# Patient Record
Sex: Male | Born: 1945
Health system: Southern US, Community
[De-identification: ages and names within clinical notes are randomized; demographics above are authoritative.]

## PROBLEM LIST (undated history)

## (undated) DIAGNOSIS — Z973 Presence of spectacles and contact lenses: Secondary | ICD-10-CM

## (undated) DIAGNOSIS — C189 Malignant neoplasm of colon, unspecified: Secondary | ICD-10-CM

## (undated) DIAGNOSIS — N182 Chronic kidney disease, stage 2 (mild): Secondary | ICD-10-CM

## (undated) DIAGNOSIS — Z972 Presence of dental prosthetic device (complete) (partial): Secondary | ICD-10-CM

## (undated) DIAGNOSIS — Z7901 Long term (current) use of anticoagulants: Secondary | ICD-10-CM

## (undated) DIAGNOSIS — I482 Chronic atrial fibrillation, unspecified: Secondary | ICD-10-CM

## (undated) DIAGNOSIS — H02401 Unspecified ptosis of right eyelid: Secondary | ICD-10-CM

## (undated) DIAGNOSIS — N289 Disorder of kidney and ureter, unspecified: Secondary | ICD-10-CM

## (undated) DIAGNOSIS — T451X5A Adverse effect of antineoplastic and immunosuppressive drugs, initial encounter: Secondary | ICD-10-CM

## (undated) DIAGNOSIS — I119 Hypertensive heart disease without heart failure: Secondary | ICD-10-CM

## (undated) DIAGNOSIS — K219 Gastro-esophageal reflux disease without esophagitis: Secondary | ICD-10-CM

## (undated) DIAGNOSIS — Z9221 Personal history of antineoplastic chemotherapy: Secondary | ICD-10-CM

## (undated) DIAGNOSIS — E2749 Other adrenocortical insufficiency: Secondary | ICD-10-CM

## (undated) DIAGNOSIS — C787 Secondary malignant neoplasm of liver and intrahepatic bile duct: Principal | ICD-10-CM

## (undated) DIAGNOSIS — I10 Essential (primary) hypertension: Secondary | ICD-10-CM

## (undated) DIAGNOSIS — M109 Gout, unspecified: Secondary | ICD-10-CM

## (undated) DIAGNOSIS — Z86018 Personal history of other benign neoplasm: Secondary | ICD-10-CM

## (undated) DIAGNOSIS — Z7189 Other specified counseling: Secondary | ICD-10-CM

## (undated) DIAGNOSIS — E038 Other specified hypothyroidism: Secondary | ICD-10-CM

## (undated) DIAGNOSIS — E291 Testicular hypofunction: Secondary | ICD-10-CM

## (undated) DIAGNOSIS — M1990 Unspecified osteoarthritis, unspecified site: Secondary | ICD-10-CM

## (undated) DIAGNOSIS — Z923 Personal history of irradiation: Secondary | ICD-10-CM

## (undated) DIAGNOSIS — D6481 Anemia due to antineoplastic chemotherapy: Secondary | ICD-10-CM

## (undated) DIAGNOSIS — E240 Pituitary-dependent Cushing's disease: Secondary | ICD-10-CM

## (undated) HISTORY — PX: TRANSPHENOIDAL PITUITARY RESECTION: SHX2572

## (undated) HISTORY — PX: OPEN PARTIAL HEPATECTOMY [83]: SHX5987

## (undated) HISTORY — DX: Secondary malignant neoplasm of liver and intrahepatic bile duct: C78.7

## (undated) HISTORY — PX: COLOSTOMY TAKEDOWN: SHX5258

## (undated) HISTORY — DX: Malignant neoplasm of colon, unspecified: C18.9

## (undated) HISTORY — PX: SUBTOTAL COLECTOMY: SHX855

## (undated) HISTORY — DX: Other specified counseling: Z71.89

## (undated) HISTORY — PX: TOTAL KNEE ARTHROPLASTY: SHX125

---

## 2009-12-28 ENCOUNTER — Ambulatory Visit
Admission: RE | Admit: 2009-12-28 | Discharge: 2009-12-28 | Payer: Self-pay | Source: Home / Self Care | Admitting: Emergency Medicine

## 2009-12-28 DIAGNOSIS — N200 Calculus of kidney: Secondary | ICD-10-CM | POA: Insufficient documentation

## 2009-12-28 DIAGNOSIS — R1031 Right lower quadrant pain: Secondary | ICD-10-CM | POA: Insufficient documentation

## 2009-12-28 LAB — CONVERTED CEMR LAB
Nitrite: NEGATIVE
Protein, U semiquant: NEGATIVE
WBC Urine, dipstick: NEGATIVE
pH: 7

## 2009-12-29 ENCOUNTER — Telehealth (INDEPENDENT_AMBULATORY_CARE_PROVIDER_SITE_OTHER): Payer: Self-pay | Admitting: *Deleted

## 2010-02-04 NOTE — Assessment & Plan Note (Signed)
Summary: ABD. PAIN,NAUSEA/WSE   Vital Signs:  Patient Profile:   65 Years Old Male CC:      abdominal Pain x 5 days,  Height:     71 inches Weight:      265 pounds O2 Sat:      99 % O2 treatment:    Room Air Temp:     98 degrees F oral Pulse rate:   78 / minute Pulse rhythm:   regular Resp:     19 per minute BP sitting:   156 / 98  (left arm) Cuff size:   large  Vitals Entered By: Emilio Math (December 28, 2009 4:52 PM)                  Current Allergies: No known allergies History of Present Illness History from: patient Chief Complaint: abdominal Pain x 5 days,  History of Present Illness: Abd pain for 5 days, slightly worse today.  No urinary symptoms, he is eating and drinking normally, no history of stones, no constipation or diarrhea.  Mild RLQ pain cramping and mild radiation to groin and to back. It only happens at night and he feels fine during the day.  He did have remote abdominal surgery in the past but thinks he still has his appendix.  REVIEW OF SYSTEMS Constitutional Symptoms      Denies fever, chills, night sweats, weight loss, weight gain, and fatigue.  Eyes       Denies change in vision, eye pain, eye discharge, glasses, contact lenses, and eye surgery. Ear/Nose/Throat/Mouth       Denies hearing loss/aids, change in hearing, ear pain, ear discharge, dizziness, frequent runny nose, frequent nose bleeds, sinus problems, sore throat, hoarseness, and tooth pain or bleeding.  Respiratory       Denies dry cough, productive cough, wheezing, shortness of breath, asthma, bronchitis, and emphysema/COPD.  Cardiovascular       Denies murmurs, chest pain, and tires easily with exhertion.    Gastrointestinal       Complains of stomach pain and nausea/vomiting.      Denies diarrhea, constipation, blood in bowel movements, and indigestion. Genitourniary       Denies painful urination, kidney stones, and loss of urinary control. Neurological       Denies paralysis,  seizures, and fainting/blackouts. Musculoskeletal       Denies muscle pain, joint pain, joint stiffness, decreased range of motion, redness, swelling, muscle weakness, and gout.  Skin       Denies bruising, unusual mles/lumps or sores, and hair/skin or nail changes.  Psych       Denies mood changes, temper/anger issues, anxiety/stress, speech problems, depression, and sleep problems.  Past History:  Past Medical History: Arthritis  Past Surgical History: Ulcers in 1968  Family History: Mother - Arthritis, Colon Cancer, Hypertension Father Deceased  Social History: Works in [employer] Married Alcohol use-no Drug use-no Drug Use:  no Physical Exam General appearance: well developed, obese, no distress noted at all Ears: normal, no lesions or deformities Nasal: mucosa pink, nonedematous, no septal deviation, turbinates normal Oral/Pharynx: tongue normal, posterior pharynx without erythema or exudate Chest/Lungs: no rales, wheezes, or rhonchi bilateral, breath sounds equal without effort Heart: Irregular rate and rhythm Abdomen: NT, ND, no guarding, no rebound, +BS4Q, no CVA tenderness, no hernia felt, no HSM, no AAA felt, I presshard on exam and he reports zero pain Back: no tenderness over musculature, straight leg raises negative bilaterally, deep tendon reflexes 2+ at achilles  and patella Skin: no obvious rashes or lesions MSE: oriented to time, place, and person Assessment New Problems: ABDOMINAL PAIN, RIGHT LOWER QUADRANT (ICD-789.03) NEPHROLITHIASIS (ICD-592.0)  UA shows trace blood, gross blood seen also in specimen   Patient Education: Patient and/or caregiver instructed in the following: rest.  Plan New Medications/Changes: CIPROFLOXACIN HCL 250 MG TABS (CIPROFLOXACIN HCL) 1 by mouth two times a day for 5 days  #10 x 0, 12/28/2009, Hoyt Koch MD  New Orders: New Patient Level III [phone] UA Dipstick w/o Micro (automated)  [81003] Planning Comments:     Will treat as kidney stone.  The history (mild, only notices at night) and physical exam are not consistant enough for appendicitis to get a CT scan right now.  I have counseled the patient that if the pain worsens, if any N/V/F, if P.O intolerance, then he needs to go direcdtly to the ER for a CT scan. I have given him a strainer for his urine.  I would like him to see his PCP in 2-3 days no matter what to ensure that he is improving.  Lots of hydration.  If pain continues, should get CT + blood work (CMP, CBC, Amylase, lipase, perhaps ESR, etc).  Patient understands and agrees. Will give prophylactic ABX for kidney stone, but please caution with Coumadin dose and inform your physician.   The patient and/or caregiver has been counseled thoroughly with regard to medications prescribed including dosage, schedule, interactions, rationale for use, and possible side effects and they verbalize understanding.  Diagnoses and expected course of recovery discussed and will return if not improved as expected or if the condition worsens. Patient and/or caregiver verbalized understanding.  Prescriptions: CIPROFLOXACIN HCL 250 MG TABS (CIPROFLOXACIN HCL) 1 by mouth two times a day for 5 days  #10 x 0   Entered and Authorized by:   Hoyt Koch MD   Signed by:   Hoyt Koch MD on 12/28/2009   Method used:   Print then Give to Patient   RxID:   [id number]   Orders Added: 1)  New Patient Level III [29528] 2)  UA Dipstick w/o Micro (automated)  [81003]    Laboratory Results   Urine Tests  Date/Time Received: December 28, 2009 5:19 PM  Date/Time Reported: December 28, 2009 5:19 PM   Routine Urinalysis   Color: yellow Appearance: Cloudy Glucose: negative   (Normal Range: Negative) Bilirubin: negative   (Normal Range: Negative) Ketone: negative   (Normal Range: Negative) Spec. Gravity: 1.020   (Normal Range: 1.003-1.035) Blood: trace-intact   (Normal Range: Negative) pH: 7.0    (Normal Range: 5.0-8.0) Protein: negative   (Normal Range: Negative) Urobilinogen: 0.2   (Normal Range: 0-1) Nitrite: negative   (Normal Range: Negative) Leukocyte Esterace: negative   (Normal Range: Negative)

## 2010-02-04 NOTE — Progress Notes (Signed)
  Phone Note Outgoing Call Call back at [facility] Phone [phone]   Call placed by: Lajean Saver RN,  December 29, 2009 3:30 PM Call placed to: Patient Action Taken: Phone Call Completed Summary of Call: Callback: Patient not home. Wife reports he is a little better.  i told her to give him the message that I called and if he has any questions or concerns to call back.

## 2013-04-16 DIAGNOSIS — L0291 Cutaneous abscess, unspecified: Secondary | ICD-10-CM | POA: Insufficient documentation

## 2013-12-20 HISTORY — PX: REVISION TOTAL KNEE ARTHROPLASTY: SUR1280

## 2017-04-10 DIAGNOSIS — T451X5A Adverse effect of antineoplastic and immunosuppressive drugs, initial encounter: Secondary | ICD-10-CM | POA: Insufficient documentation

## 2017-04-10 DIAGNOSIS — D6481 Anemia due to antineoplastic chemotherapy: Secondary | ICD-10-CM | POA: Insufficient documentation

## 2017-08-30 ENCOUNTER — Inpatient Hospital Stay: Payer: Medicare HMO | Attending: Hematology & Oncology

## 2017-08-30 ENCOUNTER — Other Ambulatory Visit: Payer: Self-pay

## 2017-08-30 ENCOUNTER — Inpatient Hospital Stay (HOSPITAL_BASED_OUTPATIENT_CLINIC_OR_DEPARTMENT_OTHER): Payer: Medicare HMO | Admitting: Hematology & Oncology

## 2017-08-30 ENCOUNTER — Encounter: Payer: Self-pay | Admitting: Hematology & Oncology

## 2017-08-30 VITALS — BP 119/72 | HR 53 | Temp 98.3°F | Resp 20 | Ht 70.0 in | Wt 268.8 lb

## 2017-08-30 DIAGNOSIS — Z9049 Acquired absence of other specified parts of digestive tract: Secondary | ICD-10-CM | POA: Diagnosis not present

## 2017-08-30 DIAGNOSIS — Z9221 Personal history of antineoplastic chemotherapy: Secondary | ICD-10-CM | POA: Diagnosis not present

## 2017-08-30 DIAGNOSIS — C787 Secondary malignant neoplasm of liver and intrahepatic bile duct: Secondary | ICD-10-CM | POA: Insufficient documentation

## 2017-08-30 DIAGNOSIS — Z7901 Long term (current) use of anticoagulants: Secondary | ICD-10-CM | POA: Insufficient documentation

## 2017-08-30 DIAGNOSIS — I482 Chronic atrial fibrillation: Secondary | ICD-10-CM | POA: Insufficient documentation

## 2017-08-30 DIAGNOSIS — Z7189 Other specified counseling: Secondary | ICD-10-CM

## 2017-08-30 DIAGNOSIS — C185 Malignant neoplasm of splenic flexure: Secondary | ICD-10-CM | POA: Insufficient documentation

## 2017-08-30 DIAGNOSIS — C189 Malignant neoplasm of colon, unspecified: Secondary | ICD-10-CM

## 2017-08-30 HISTORY — DX: Other specified counseling: Z71.89

## 2017-08-30 HISTORY — DX: Secondary malignant neoplasm of liver and intrahepatic bile duct: C18.9

## 2017-08-30 LAB — CBC WITH DIFFERENTIAL (CANCER CENTER ONLY)
BASOS ABS: 0 10*3/uL (ref 0.0–0.1)
Basophils Relative: 1 %
EOS ABS: 0.2 10*3/uL (ref 0.0–0.5)
EOS PCT: 4 %
HEMATOCRIT: 38 % — AB (ref 38.7–49.9)
Hemoglobin: 12.3 g/dL — ABNORMAL LOW (ref 13.0–17.1)
LYMPHS PCT: 26 %
Lymphs Abs: 1.3 10*3/uL (ref 0.9–3.3)
MCH: 29.4 pg (ref 28.0–33.4)
MCHC: 32.4 g/dL (ref 32.0–35.9)
MCV: 90.7 fL (ref 82.0–98.0)
MONO ABS: 0.6 10*3/uL (ref 0.1–0.9)
Monocytes Relative: 12 %
Neutro Abs: 2.8 10*3/uL (ref 1.5–6.5)
Neutrophils Relative %: 57 %
PLATELETS: 117 10*3/uL — AB (ref 145–400)
RBC: 4.19 MIL/uL — ABNORMAL LOW (ref 4.20–5.70)
RDW: 14.5 % (ref 11.1–15.7)
WBC: 4.9 10*3/uL (ref 4.0–10.0)

## 2017-08-30 LAB — CMP (CANCER CENTER ONLY)
ALT: 6 U/L (ref 0–44)
ANION GAP: 8 (ref 5–15)
AST: 17 U/L (ref 15–41)
Albumin: 3.5 g/dL (ref 3.5–5.0)
Alkaline Phosphatase: 98 U/L (ref 38–126)
BILIRUBIN TOTAL: 0.6 mg/dL (ref 0.3–1.2)
BUN: 13 mg/dL (ref 8–23)
CHLORIDE: 105 mmol/L (ref 98–111)
CO2: 29 mmol/L (ref 22–32)
Calcium: 9.2 mg/dL (ref 8.9–10.3)
Creatinine: 1.43 mg/dL — ABNORMAL HIGH (ref 0.61–1.24)
GFR, EST NON AFRICAN AMERICAN: 48 mL/min — AB (ref >60)
GFR, Est AFR Am: 56 mL/min — ABNORMAL LOW (ref >60)
Glucose, Bld: 177 mg/dL — ABNORMAL HIGH (ref 70–99)
POTASSIUM: 3.9 mmol/L (ref 3.5–5.1)
Sodium: 142 mmol/L (ref 135–145)
TOTAL PROTEIN: 6.5 g/dL (ref 6.5–8.1)

## 2017-08-30 LAB — LACTATE DEHYDROGENASE: LDH: 252 U/L — AB (ref 98–192)

## 2017-08-30 NOTE — Progress Notes (Signed)
Referral MD  Reason for Referral: Metastatic colon cancer-second opinion  Chief Complaint  Patient presents with  . New Patient (Initial Visit)  : I just wanted to make sure that everything was being done.  HPI: Zachary Willis is a real nice 72 year old white male.  He comes in with his wife.  He is originally from [place].  He actually graduated from my wife's high school.  He was diagnosed with what sounds like high risk stage II colon cancer back in December 2017.  He had obstruction.  He had resection of the tumor.  The tumor was in the splenic flexure.  He was found to have stage IIb disease (T4bN0M0).  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  He was offered adjuvant chemotherapy because he was felt to be high risk.  He declined.  Unfortunately, his CEA started going up in May 2018.  He ultimately was found to have a solitary liver lesion in August 2018.  This was confirmed by biopsy.  He subsequently had "neoadjuvant" chemotherapy with FOLFOX.  He then underwent a partial hepatectomy in January 2019.  All margins were negative.  He then had adjuvant FOLFOX for 11 cycles.  Unfortunately, his tumor has continued to recur.  In August 2019, a CT scan showed 2 liver lesions.  There is also some questionable left supraclavicular and left cervical lymph nodes.  I would not think that these would be malignant.  He is getting fantastic care from his oncologist in [place].  He was offered systemic chemotherapy with FOLFIRI.  He has felt good being off of treatment.  He really has not had any complaints.  He has had no abdominal pain.  He has had no issues with bowels or bladder.  He has had no nausea or vomiting.  He has had no cough.  He does have multiple medical problems.  He is on chronic Coumadin for atrial fibrillation.  I am not sure why he is on Coumadin and not on 1 of the new oral anticoagulants.  Being on Coumadin is such a hassle from my point of view.  He and his wife are  going to go on a cruise the first week in September.  They will be going to the [place].  They just wanted to have a second opinion to see if everything has been done that needs to be done so far.  They also want to see what other options they might have.  He has had no neuropathy.  He has had no rashes.  He has had no mouth sores.  Currently, his performance status is ECOG 1.    Past Medical History:  Diagnosis Date  . Colon cancer metastasized to liver ([place]) 08/30/2017  . Goals of care, counseling/discussion 08/30/2017  :  History reviewed. No pertinent surgical history.:   Current Outpatient Medications:  .  allopurinol (ZYLOPRIM) 300 MG tablet, Take 300 mg by mouth daily., Disp: , Rfl:  .  doxazosin (CARDURA) 4 MG tablet, 4 mg at bedtime., Disp: , Rfl: 1 .  ferrous sulfate 325 (65 FE) MG tablet, Take 325 mg by mouth daily., Disp: , Rfl:  .  furosemide (LASIX) 40 MG tablet, Take 20 mg by mouth daily., Disp: , Rfl:  .  hydrocortisone (CORTEF) 10 MG tablet, Takes 15 mg in am and 2m in afternoon., Disp: , Rfl: 4 .  levothyroxine (SYNTHROID, LEVOTHROID) 88 MCG tablet, Take 88 mcg by mouth daily., Disp: , Rfl:  .  metoprolol succinate (TOPROL-XL)  50 MG 24 hr tablet, Take 50 mg by mouth daily., Disp: , Rfl:  .  NASAL WASH NA, daily., Disp: , Rfl:  .  pantoprazole (PROTONIX) 40 MG tablet, Take 40 mg by mouth daily., Disp: , Rfl:  .  polyvinyl alcohol (LIQUIFILM TEARS) 1.4 % ophthalmic solution, daily as needed., Disp: , Rfl:  .  potassium chloride (KLOR-CON 10) 10 MEQ tablet, Take 20 mEq by mouth daily., Disp: , Rfl:  .  prochlorperazine (COMPAZINE) 10 MG tablet, Take by mouth., Disp: , Rfl:  .  testosterone (ANDRODERM) 4 MG/24HR PT24 patch, Place onto the skin., Disp: , Rfl:  .  warfarin (COUMADIN) 5 MG tablet, Take 5 mg by mouth daily., Disp: , Rfl: 0 .  loperamide (IMODIUM) 2 MG capsule, Take by mouth., Disp: , Rfl: :  :  No Known Allergies:  History reviewed. No  pertinent family history.:  Social History   Socioeconomic History  . Marital status: Married    Spouse name: Not on file  . Number of children: Not on file  . Years of education: Not on file  . Highest education level: Not on file  Occupational History  . Not on file  Social Needs  . Financial resource strain: Not on file  . Food insecurity:    Worry: Not on file    Inability: Not on file  . Transportation needs:    Medical: Not on file    Non-medical: Not on file  Tobacco Use  . Smoking status: Never Smoker  . Smokeless tobacco: Never Used  Substance and Sexual Activity  . Alcohol use: Not Currently  . Drug use: Never  . Sexual activity: Not on file  Lifestyle  . Physical activity:    Days per week: Not on file    Minutes per session: Not on file  . Stress: Not on file  Relationships  . Social connections:    Talks on phone: Not on file    Gets together: Not on file    Attends religious service: Not on file    Active member of club or organization: Not on file    Attends meetings of clubs or organizations: Not on file    Relationship status: Not on file  . Intimate partner violence:    Fear of current or ex partner: Not on file    Emotionally abused: Not on file    Physically abused: Not on file    Forced sexual activity: Not on file  Other Topics Concern  . Not on file  Social History Narrative  . Not on file  :  Review of Systems  Constitutional: Negative.   HENT: Negative.   Eyes: Negative.   Respiratory: Negative.   Cardiovascular: Negative.   Gastrointestinal: Negative.   Genitourinary: Negative.   Musculoskeletal: Negative.   Skin: Negative.   Neurological: Negative.   Endo/Heme/Allergies: Negative.   Psychiatric/Behavioral: Negative.      Exam: Moderately obese white male in no obvious distress.  His vital signs show a temperature of 98.3.  Pulse 53.  Blood pressure 119/72.  Weight is 268 pounds.  Head neck exam shows no ocular or oral  lesions.  There might be some slight ptosis of his right eye.  He has good pupillary reflex.  He has no oral lesions.  He has no adenopathy in the neck.  Thyroid is not palpable.  Lungs are clear bilaterally.  Cardiac exam regular rate and rhythm with no murmurs, rubs or bruits.  Abdomen is  soft.  He has multiple laparotomy scars.  He has no fluid wave.  He is moderately obese.  There is no guarding or rebound tenderness.  He has no palpable liver or spleen tip.  Back exam shows no tenderness over the spine, ribs or hips.  Extremities shows no clubbing, cyanosis or edema.  He may have some slight nonpitting edema of his legs.  He has good range of motion of his joints.  Neurological exam shows no focal neurological deficit.  Skin exam shows no rashes, ecchymoses or petechia. '@IPVITALS' @   Recent Labs    08/30/17 1118  WBC 4.9  HGB 12.3*  HCT 38.0*  PLT 117*   Recent Labs    08/30/17 1118  NA 142  K 3.9  CL 105  CO2 29  GLUCOSE 177*  BUN 13  CREATININE 1.43*  CALCIUM 9.2    Blood smear review: None  Pathology: None    Assessment and Plan: Mr. Arkwright is a really nice 72 year old white male.  He has metastatic colon cancer.  He keeps having recurrent liver mets.  He is Artie had a partial hepatectomy.  He has been on chemotherapy with FOLFOX.  Unfortunately, he cannot receive EGFR inhibitors because he is K-ras positive.  He has not had FOLFIRI with Avastin.  I think this would be very reasonable.  Given that this tumor is left sided, there might be some benefit to Avastin with his full ferry.  I think the benefit of Avastin would outweigh the risk.  He is on blood thinner so I do not think a thrombo-embolic event from Avastin would be likely.  If he does not want systemic chemotherapy right now, he could have RFA therapy to these liver lesions.  They seem to be small enough that they would respond to RFA therapy.  I also talked him about the possibility of doing a biopsy of 1 of the  liver lesions.  I am a big believer of trying to obtain as up-to-date as possible genetic information that might be useful to see if targeted therapy could be accomplished.  I think this would be helpful.  He has not had a PET scan.  This might be helpful to see if he does have extrahepatic disease.  I reassured him that I really thought that he was getting very good care from Dr. Marylou Mccoy in [place].  I think that she has done a very good job.  He certainly has aggressive disease given the fact that his cancer is K-ras mutant.  I told Mr. Paullin and his wife that I would be more than happy to help out in any way possible.  However, he seems to be doing pretty well over at [facility].  He seems to have a good relationship with his oncologist there.  I spent a good 60 minutes with he and his wife.  All the time was spent face-to-face.  I was reviewing his past labs and past records from [place].  I was counseling them.  I will certainly help in any way that I can with any additional appointments if they would like to come back to see me.  I reassured him that they could certainly go on their [place] and not have any issues if he has had no treatment.  It will be interesting to see what his CEA level is.  Again, I will will be more than happy to see him back in our office if he wishes to have any treatment here.

## 2017-08-31 LAB — CEA (IN HOUSE-CHCC): CEA (CHCC-In House): 35.88 ng/mL — ABNORMAL HIGH (ref 0.00–5.00)

## 2017-09-18 ENCOUNTER — Ambulatory Visit: Payer: Medicare HMO | Admitting: Hematology & Oncology

## 2017-09-18 ENCOUNTER — Other Ambulatory Visit: Payer: Medicare HMO

## 2017-09-19 ENCOUNTER — Encounter: Payer: Self-pay | Admitting: Hematology & Oncology

## 2017-09-19 ENCOUNTER — Other Ambulatory Visit: Payer: Self-pay

## 2017-09-19 ENCOUNTER — Inpatient Hospital Stay: Payer: Medicare HMO | Attending: Hematology & Oncology | Admitting: Hematology & Oncology

## 2017-09-19 VITALS — BP 113/81 | HR 79 | Temp 98.5°F | Resp 20 | Wt 266.0 lb

## 2017-09-19 DIAGNOSIS — I482 Chronic atrial fibrillation: Secondary | ICD-10-CM | POA: Diagnosis not present

## 2017-09-19 DIAGNOSIS — C189 Malignant neoplasm of colon, unspecified: Secondary | ICD-10-CM | POA: Diagnosis present

## 2017-09-19 DIAGNOSIS — Z7901 Long term (current) use of anticoagulants: Secondary | ICD-10-CM

## 2017-09-19 DIAGNOSIS — C787 Secondary malignant neoplasm of liver and intrahepatic bile duct: Secondary | ICD-10-CM | POA: Insufficient documentation

## 2017-09-19 NOTE — Progress Notes (Signed)
Hematology and Oncology Follow Up Visit  Zachary Willis [id number] 10-27-1945 72 y.o. 09/19/2017   Principle Diagnosis:   Metastatic colon cancer  Current Therapy:    Observation     Interim History:  [name] is back for second office visit.  We first saw him back on 08/30/2017.  At that time, he had been treated over at [facility].  He had gotten very good care.  He thought that things might be better if he could get treated closer to where he lives.  He and his wife does go back from a [place] cruise.  That a wonderful time.  When we saw him, the only areas of malignancy that I could detect was in his liver.  I think that he may have had 1 or 2 lesions in his liver.  We are going to get a PET scan on him.  I think that if he only has some disease in his liver, that we should consider a form of intrahepatic therapy.  I think this would be very reasonable for him.  He looks like he has had quite a bit of systemic therapy already.  I also thought about the possibility of a biopsy.  Again this would be invasive.  He is on Coumadin for chronic atrial fibrillation.  He is eating well.  He has had no problems with pain.  He has had no bleeding issues.  There is no cough or shortness of breath.  Overall, I said his performance status is ECOG 1.   Medications:  Current Outpatient Medications:  .  allopurinol (ZYLOPRIM) 300 MG tablet, Take 300 mg by mouth daily., Disp: , Rfl:  .  doxazosin (CARDURA) 4 MG tablet, 4 mg at bedtime., Disp: , Rfl: 1 .  ferrous sulfate 325 (65 FE) MG tablet, Take 325 mg by mouth daily., Disp: , Rfl:  .  furosemide (LASIX) 40 MG tablet, Take 20 mg by mouth daily., Disp: , Rfl:  .  hydrocortisone (CORTEF) 10 MG tablet, Takes 15 mg in am and 5mg  in afternoon., Disp: , Rfl: 4 .  levothyroxine (SYNTHROID, LEVOTHROID) 88 MCG tablet, Take 88 mcg by mouth daily., Disp: , Rfl:  .  metoprolol succinate (TOPROL-XL) 50 MG 24 hr tablet, Take 50 mg by mouth daily., Disp: ,  Rfl:  .  NASAL WASH NA, daily., Disp: , Rfl:  .  pantoprazole (PROTONIX) 40 MG tablet, Take 40 mg by mouth daily., Disp: , Rfl:  .  polyvinyl alcohol (LIQUIFILM TEARS) 1.4 % ophthalmic solution, daily as needed., Disp: , Rfl:  .  potassium chloride (KLOR-CON 10) 10 MEQ tablet, Take 20 mEq by mouth daily., Disp: , Rfl:  .  testosterone (ANDRODERM) 4 MG/24HR PT24 patch, Place onto the skin., Disp: , Rfl:  .  loperamide (IMODIUM) 2 MG capsule, Take by mouth as needed. , Disp: , Rfl:  .  prochlorperazine (COMPAZINE) 10 MG tablet, Take by mouth., Disp: , Rfl:  .  warfarin (COUMADIN) 5 MG tablet, Take 5 mg by mouth daily., Disp: , Rfl: 0  Allergies: No Known Allergies  Past Medical History, Surgical history, Social history, and Family History were reviewed and updated.  Review of Systems: Review of Systems  Constitutional: Negative.   HENT:  Negative.   Eyes: Negative.   Respiratory: Negative.   Cardiovascular: Negative.   Gastrointestinal: Negative.   Endocrine: Negative.   Genitourinary: Negative.    Musculoskeletal: Negative.   Skin: Negative.   Neurological: Negative.   Hematological: Negative.  Psychiatric/Behavioral: Negative.     Physical Exam:  weight is 266 lb 0.6 oz (120.7 kg). His oral temperature is 98.5 F (36.9 C). His blood pressure is 113/81 and his pulse is 79. His respiration is 20 and oxygen saturation is 99%.   Wt Readings from Last 3 Encounters:  09/19/17 266 lb 0.6 oz (120.7 kg)  08/30/17 268 lb 12.8 oz (121.9 kg)  12/28/09 (!) 265 lb (120.2 kg)    Physical Exam  Constitutional: He is oriented to person, place, and time.  HENT:  Head: Normocephalic and atraumatic.  Mouth/Throat: Oropharynx is clear and moist.  Eyes: Pupils are equal, round, and reactive to light. EOM are normal.  Neck: Normal range of motion.  Cardiovascular: Normal rate, regular rhythm and normal heart sounds.  Regular rate and rhythm with an occasional extra beat.  Pulmonary/Chest:  Effort normal and breath sounds normal.  Abdominal: Soft. Bowel sounds are normal.  Abdomen is soft.  He has multiple laparotomy scars.  There is no fluid wave.  There is no palpable liver or spleen tip.  Musculoskeletal: Normal range of motion. He exhibits no edema, tenderness or deformity.  Lymphadenopathy:    He has no cervical adenopathy.  Neurological: He is alert and oriented to person, place, and time.  Skin: Skin is warm and dry. No rash noted. No erythema.  Psychiatric: He has a normal mood and affect. His behavior is normal. Judgment and thought content normal.  Vitals reviewed.    Lab Results  Component Value Date   WBC 4.9 08/30/2017   HGB 12.3 (L) 08/30/2017   HCT 38.0 (L) 08/30/2017   MCV 90.7 08/30/2017   PLT 117 (L) 08/30/2017     Chemistry      Component Value Date/Time   NA 142 08/30/2017 1118   K 3.9 08/30/2017 1118   CL 105 08/30/2017 1118   CO2 29 08/30/2017 1118   BUN 13 08/30/2017 1118   CREATININE 1.43 (H) 08/30/2017 1118      Component Value Date/Time   CALCIUM 9.2 08/30/2017 1118   ALKPHOS 98 08/30/2017 1118   AST 17 08/30/2017 1118   ALT 6 08/30/2017 1118   BILITOT 0.6 08/30/2017 1118       Impression and Plan: [name] is a 72 year old white male with metastatic colon cancer.  When we first saw him, his CEA was 36.  We certainly have this that we can utilize.  Again, we will see what the PET scan shows.  Then I will see about getting him over to interventional radiology to see if they feel that intrahepatic therapy is appropriate.  Hopefully, there is no evidence of extrahepatic disease.  If he does have extrahepatic disease, then I would consider a liver biopsy so that we could do molecular testing on him.  I am happy that they had a wonderful trip.  We will try to take care of a lot of issues over the phone and not have to have him come in all the time.  I spent about 35 minutes with he and his wife.  All the time spent face-to-face  with them.  I counseled them and help coordinate his appointments and future care.  I answered all other questions. [name], MD 9/17/20193:05 PM

## 2017-09-29 ENCOUNTER — Encounter (HOSPITAL_COMMUNITY)
Admission: RE | Admit: 2017-09-29 | Discharge: 2017-09-29 | Disposition: A | Discharge status: Home / Self Care | Payer: Medicare HMO | Source: Ambulatory Visit | Attending: Hematology & Oncology | Admitting: Hematology & Oncology

## 2017-09-29 DIAGNOSIS — C189 Malignant neoplasm of colon, unspecified: Secondary | ICD-10-CM | POA: Diagnosis present

## 2017-09-29 DIAGNOSIS — C787 Secondary malignant neoplasm of liver and intrahepatic bile duct: Secondary | ICD-10-CM | POA: Diagnosis present

## 2017-09-29 LAB — GLUCOSE, CAPILLARY: Glucose-Capillary: 124 mg/dL — ABNORMAL HIGH (ref 70–99)

## 2017-09-29 MED ORDER — FLUDEOXYGLUCOSE F - 18 (FDG) INJECTION
13.2000 | Freq: Once | INTRAVENOUS | Status: AC
  Administered 2017-09-29: 13.2 via INTRAVENOUS

## 2017-10-01 ENCOUNTER — Other Ambulatory Visit: Payer: Self-pay | Admitting: Hematology & Oncology

## 2017-10-01 DIAGNOSIS — C787 Secondary malignant neoplasm of liver and intrahepatic bile duct: Principal | ICD-10-CM

## 2017-10-01 DIAGNOSIS — C189 Malignant neoplasm of colon, unspecified: Secondary | ICD-10-CM

## 2017-10-02 ENCOUNTER — Telehealth: Payer: Self-pay | Admitting: *Deleted

## 2017-10-02 NOTE — Telephone Encounter (Signed)
Lmom for Zachary Willis to schedule consultation with Interventional Radiology.Cathren Harsh

## 2017-10-02 NOTE — Telephone Encounter (Addendum)
Patient is aware of results  ----- Message from [name], MD sent at 10/01/2017  7:46 AM EDT ----- Call - PET scan only show 2 spots in the liver.  We will move ahead with liver directed therapy!!!  [name]

## 2017-10-10 ENCOUNTER — Ambulatory Visit
Admission: RE | Admit: 2017-10-10 | Discharge: 2017-10-10 | Disposition: A | Discharge status: Home / Self Care | Payer: Medicare HMO | Source: Ambulatory Visit | Attending: Hematology & Oncology | Admitting: Hematology & Oncology

## 2017-10-10 ENCOUNTER — Other Ambulatory Visit: Payer: Self-pay | Admitting: *Deleted

## 2017-10-10 ENCOUNTER — Other Ambulatory Visit (HOSPITAL_COMMUNITY): Payer: Self-pay | Admitting: Interventional Radiology

## 2017-10-10 DIAGNOSIS — C787 Secondary malignant neoplasm of liver and intrahepatic bile duct: Principal | ICD-10-CM

## 2017-10-10 DIAGNOSIS — C189 Malignant neoplasm of colon, unspecified: Secondary | ICD-10-CM

## 2017-10-10 HISTORY — PX: IR RADIOLOGIST EVAL & MGMT: IMG5224

## 2017-10-10 NOTE — Consult Note (Signed)
Chief Complaint: Metastatic colon cancer  Referring Physician(s): Ennever,Peter R  History of Present Illness: Zachary Willis is a 72 y.o. male with past medical history significant for atrial fibrillation (on chronic Coumadin), pituitary tumor post resection, on chronic steroid supplementation, hypertension, hypothyroidism who was diagnosed with colon cancer in December 2017, after he presented with colonic obstruction in the [facility].  He subsequent underwent a hemicolectomy.  He was subsequently found to have an elevated CEA level and was found to have a liver lesion which was biopsied confirming metastatic disease.  As such, patient underwent neoadjuvant chemotherapy with FOLFOX followed by partial hepatectomy in January 2019 with subsequent adjuvant FOLFOX receiving a total of 11 cycles.  By patient report, post resection imaging performed in May of this year was negative for evidence of disease however CEA levels were found to increase again earlier this summer with subsequent outside CT scan performed in August demonstrated 2 liver lesions worrisome for new areas of metastatic disease.  At this time, the patient transferred care to Dr. Marin Olp in hopes of receiving a second opinion and receiving additional treatment closer to home.  Dr. Marin Olp ordered a PET scan which was performed on 09/29/2017 and confirmed both liver lesions to be hypermetabolic.  Patient has now been referred by Dr. Marin Olp for evaluation of potential hepatic directed therapy.  (Note, patient does have a hypermetabolic left-sided thyroid nodule however has previously undergone biopsy with benign results.)  While the patient is not completely opposed to systemic therapy he is interested in pursuing all available treatment options.  Patient is accompanied by his wife though serves as his own historian.  Patient reports unintentional weight gain which he attributes to his chronic steroid usage.  He  denies change in appetite or energy level.  No abdominal pain.  Patient remains functionally independent with all activities of daily living.  Past Medical History:  Diagnosis Date  . Atrial fibrillation ([place])   . Colon cancer metastasized to liver ([place]) 08/30/2017  . Goals of care, counseling/discussion 08/30/2017    Past Surgical History:  Procedure Laterality Date  . IR RADIOLOGIST EVAL & MGMT  10/10/2017    Allergies: Patient has no known allergies.  Medications: Prior to Admission medications   Medication Sig Start Date End Date Taking? Authorizing Provider  allopurinol (ZYLOPRIM) 300 MG tablet Take 300 mg by mouth daily. 01/28/10  Yes [provider]  doxazosin (CARDURA) 4 MG tablet 4 mg at bedtime. 08/13/17  Yes [provider]  ferrous sulfate 325 (65 FE) MG tablet Take 325 mg by mouth daily.   Yes [provider]  furosemide (LASIX) 40 MG tablet Take 20 mg by mouth daily.   Yes [provider]  hydrocortisone (CORTEF) 10 MG tablet Takes 15 mg in am and 5mg  in afternoon. 08/19/17  Yes [provider]  levothyroxine (SYNTHROID, LEVOTHROID) 88 MCG tablet Take 88 mcg by mouth daily. 05/15/17  Yes [provider]  loperamide (IMODIUM) 2 MG capsule Take by mouth as needed.    Yes [provider]  metoprolol succinate (TOPROL-XL) 50 MG 24 hr tablet Take 50 mg by mouth daily. 04/19/16  Yes [provider]  NASAL [place] NA daily.   Yes [provider]  pantoprazole (PROTONIX) 40 MG tablet Take 40 mg by mouth daily. 05/16/14  Yes [provider]  polyvinyl alcohol (LIQUIFILM TEARS) 1.4 % ophthalmic solution daily as needed.   Yes [provider]  potassium chloride (KLOR-CON 10)  10 MEQ tablet Take 20 mEq by mouth daily. 11/17/14  Yes [provider]  prochlorperazine (COMPAZINE) 10 MG tablet Take by mouth. 09/01/16  Yes [provider]  testosterone (ANDRODERM) 4 MG/24HR PT24 patch  Place onto the skin. 04/17/15  Yes [provider]  warfarin (COUMADIN) 5 MG tablet Take 5 mg by mouth daily. 07/05/17  Yes [provider]     No family history on file.  Social History   Socioeconomic History  . Marital status: Married    Spouse name: Not on file  . Number of children: Not on file  . Years of education: Not on file  . Highest education level: Not on file  Occupational History  . Not on file  Social Needs  . Financial resource strain: Not on file  . Food insecurity:    Worry: Not on file    Inability: Not on file  . Transportation needs:    Medical: Not on file    Non-medical: Not on file  Tobacco Use  . Smoking status: Never Smoker  . Smokeless tobacco: Never Used  Substance and Sexual Activity  . Alcohol use: Not Currently  . Drug use: Never  . Sexual activity: Not on file  Lifestyle  . Physical activity:    Days per week: Not on file    Minutes per session: Not on file  . Stress: Not on file  Relationships  . Social connections:    Talks on phone: Not on file    Gets together: Not on file    Attends religious service: Not on file    Active member of club or organization: Not on file    Attends meetings of clubs or organizations: Not on file    Relationship status: Not on file  Other Topics Concern  . Not on file  Social History Narrative  . Not on file    ECOG Status: 1 - Symptomatic but completely ambulatory  Review of Systems: A 12 point ROS discussed and pertinent positives are indicated in the HPI above.  All other systems are negative.  Review of Systems  Constitutional: Positive for unexpected weight change. Negative for activity change, appetite change and fatigue.  Respiratory: Negative.   Cardiovascular: Negative.   Gastrointestinal: Negative.   Skin: Negative.     Vital Signs: BP 129/89   Pulse 62   Temp 98.3 F (36.8 C) (Oral)   Resp 17   Ht 5\' 10"  (1.778 m)   Wt 117.9 kg   SpO2 98%   BMI 37.31  kg/m   Physical Exam  Constitutional: He appears well-developed and well-nourished.  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  Psychiatric: He has a normal mood and affect. His behavior is normal. Thought content normal.    Imaging: Nm Pet Image Initial (pi) Skull Base To Thigh  Result Date: 09/30/2017 CLINICAL DATA:  Initial treatment strategy for colon cancer with liver metastases. EXAM: NUCLEAR MEDICINE PET SKULL BASE TO THIGH TECHNIQUE: 13.2 mCi F-18 FDG was injected intravenously. Full-ring PET imaging was performed from the skull base to thigh after the radiotracer. CT data was obtained and used for attenuation correction and anatomic localization. Fasting blood glucose: 124 mg/dl COMPARISON:  None. FINDINGS: Mediastinal blood pool activity: SUV max 3.2 NECK: No hypermetabolic cervical lymphadenopathy. 4.6 cm  left inferior thyroid nodule, max SUV 7.4. Incidental CT findings: None. CHEST: No hypermetabolic pulmonary nodules. 5 mm subpleural triangular nodule in the right middle lobe (series 8/image 41), favoring a benign subpleural lymph node and non FDG avid, although technically beneath the size threshold for PET sensitivity. No hypermetabolic thoracic lymphadenopathy. Incidental CT findings: Trace bilateral pleural effusions. Mild atherosclerotic calcifications of the aortic arch. Three vessel coronary atherosclerosis. Right chest port terminates in the mid SVC. ABDOMEN/PELVIS: 2.1 cm metastasis abutting the caudate (series 4/image 114), max SUV 11.2. 1.8 cm metastasis the junction of segment 5/6 (series 4/image 119), max SUV 11.5. No abnormal hypermetabolism in the pancreas, spleen, or adrenal glands. No hypermetabolic abdominopelvic lymphadenopathy. Status post right  hemicolectomy with appendectomy. Incidental CT findings: Status post cholecystectomy. Atherosclerotic calcifications the abdominal aorta and branch vessels. 6.9 cm left upper pole renal cyst. Bladder is thick-walled although underdistended. SKELETON: No focal hypermetabolic activity to suggest skeletal metastasis. Incidental CT findings: Degenerative changes of the visualized thoracolumbar spine. IMPRESSION: Status post right hemicolectomy. Two hepatic metastases, as above. No evidence of metastatic disease in the chest. Suspected benign subpleural lymph node in the right middle lobe. Trace bilateral pleural effusions. Hypermetabolic left inferior thyroid nodule, indeterminate. Consider thyroid ultrasound with possible sampling, as clinically warranted. Electronically Signed   By: Julian Hy M.D.   On: 09/30/2017 19:48   Ir Radiologist Eval & Mgmt  Result Date: 10/10/2017 Please refer to notes tab for details about interventional procedure. (Op Note)   Labs:  CBC: Recent Labs    08/30/17 1118  WBC 4.9  HGB 12.3*  HCT 38.0*  PLT 117*    COAGS: No results for input(s): INR, APTT in the last 8760 hours.  BMP: Recent Labs    08/30/17 1118  NA 142  K 3.9  CL 105  CO2 29  GLUCOSE 177*  BUN 13  CALCIUM 9.2  CREATININE 1.43*  GFRNONAA 48*  GFRAA 56*    LIVER FUNCTION TESTS: Recent Labs    08/30/17 1118  BILITOT 0.6  AST 17  ALT 6  ALKPHOS 98  PROT 6.5  ALBUMIN 3.5    TUMOR MARKERS: CEA - 36  (08/30/17)   Assessment and Plan:  MADOX CORKINS is a 72 y.o. male with past medical history significant for atrial fibrillation (on chronic Coumadin), pituitary tumor post resection (requiring chronic steroid supplementation), hypertension and hypothyroidism who was diagnosed with colon cancer in December 2017, after he presented with colonic obstruction in the [facility].  He subsequent underwent a hemicolectomy and ultimately underwent partial hepatectomy for  hepatic metastatic disease in January of this year.  He received neoadjuvant and subsequent adjuvant chemotherapy with FOLFOX receiving a total of 11 cycles (previous medical care performed at [facility]).  Unfortunately, by report, despite no evidence of disease on surveillance imaging performed in May of this year, CEA levels were found to rise over the summer with outside CT scan performed in August demonstrating 2 new liver lesions both of which were found to be hypermetabolic on PET scan performed 09/29/2017.  While the patient is not completely opposed to systemic therapy he is interested in pursuing all available treatment options.  Patient reports unintentional weight gain which he attributes to his chronic steroid usage.  He is otherwise without complaint with an ECOG level of 1.  I explained to the patient that I am concerned he has developed metastatic disease so rapidly after reportedly having no evidence of disease on a scan  performed only 4 months prior.  As such, I have recommended the patient undergo a diagnostic MRI to both further evaluate the size and location of the hypermetabolic lesion seen on preceding PET/CT as well as to exclude the presence of any additional liver lesions.  Assuming the patient only has these 2 liver lesions, I would ideally like to offer image guided hepatic microwave ablation to be performed with curative intent of both of these sites of metastatic disease.    While the one lesion is located within the subcapsular aspect of the right lobe of the liver and measures 1.8 cm (PET/CT image 119, series 4) and it is an excellent location for potential microwave ablation, the additional ill-defined lesion is larger measuring 2.1 cm and is located within the caudate and adjacent to the porta hepatis (image 114, series 4).  Additionally, the lesion is ill-defined on noncontrast CT imaging with the associated PET scan.  I explained to the patient that if  microwave ablation is pursued, a patient should have less than 3 lesions each of which should be less than 3 cm and the location of said lesions should be amenable to safe ablation.  As I am uncertain that the lesion within the caudate meets the location criteria, and as Dr. Marin Olp wishes to obtain additional tissue for up-to-date genetic information as future systemic therapy is likely inevitable, I will then pursue ultrasound-guided liver lesion biopsy.  At the time of the biopsy, I will perform a sonographic evaluation to determine the feasibility of ablation of the caudate lesion.  Above was discussed in great detail with the patient and the patient's wife who demonstrated excellent understanding.  Plan:  - We will obtain contrasted enhanced abdominal MRI. - Pending results of abdominal MRI, I will perform an ultrasound-guided biopsy of lesion within the subcapsular aspect the right lobe of the liver with sonographic evaluation of the additional caudate lesion.  (Note, patient received cardiac clearance to come off of his Coumadin prior to his liver resection, and as such, his Coumadin will be held a minimum of 5 days prior to ultrasound-guided biopsy). - If lesions are determined to be amenable to microwave ablation, we will submit for insurance approval and arrange for the procedure to be performed at [facility] with general anesthesia.  The procedure will entail an overnight admission for observation and PCA usage. * If any of the above plans are altered and transcatheter directed therapy is pursued, I will see the patient in repeat consultation for a more detailed conversation regarding risks and benefits of transcatheter therapy (likely Y 90).  Patient and patient's wife know to call the interventional radiology clinic with any interval questions or concerns.  Thank you for this interesting consult.  I greatly enjoyed meeting Zachary Willis and look forward to participating in their  care.  A copy of this report was sent to the requesting provider on this date.  Electronically Signed: Sandi Mariscal 10/10/2017, 9:56 AM   I spent a total of 40 Minutes in face to face in clinical consultation, greater than 50% of which was counseling/coordinating care for metastatic colon cancer

## 2017-10-17 ENCOUNTER — Ambulatory Visit (HOSPITAL_COMMUNITY)
Admission: RE | Admit: 2017-10-17 | Discharge: 2017-10-17 | Disposition: A | Discharge status: Home / Self Care | Payer: Medicare HMO | Source: Ambulatory Visit | Attending: Interventional Radiology | Admitting: Interventional Radiology

## 2017-10-17 DIAGNOSIS — R162 Hepatomegaly with splenomegaly, not elsewhere classified: Secondary | ICD-10-CM | POA: Insufficient documentation

## 2017-10-17 DIAGNOSIS — K760 Fatty (change of) liver, not elsewhere classified: Secondary | ICD-10-CM | POA: Insufficient documentation

## 2017-10-17 DIAGNOSIS — C787 Secondary malignant neoplasm of liver and intrahepatic bile duct: Secondary | ICD-10-CM | POA: Insufficient documentation

## 2017-10-17 DIAGNOSIS — C189 Malignant neoplasm of colon, unspecified: Secondary | ICD-10-CM | POA: Diagnosis present

## 2017-10-17 DIAGNOSIS — J90 Pleural effusion, not elsewhere classified: Secondary | ICD-10-CM | POA: Diagnosis not present

## 2017-10-17 LAB — POCT I-STAT CREATININE: CREATININE: 1.4 mg/dL — AB (ref 0.61–1.24)

## 2017-10-17 MED ORDER — GADOBUTROL 1 MMOL/ML IV SOLN
10.0000 mL | Freq: Once | INTRAVENOUS | Status: AC | PRN
  Administered 2017-10-17: 10 mL via INTRAVENOUS

## 2017-10-19 ENCOUNTER — Telehealth: Payer: Self-pay | Admitting: Interventional Radiology

## 2017-10-19 ENCOUNTER — Telehealth: Payer: Self-pay | Admitting: Radiology

## 2017-10-19 ENCOUNTER — Other Ambulatory Visit (HOSPITAL_COMMUNITY): Payer: Self-pay | Admitting: Interventional Radiology

## 2017-10-19 DIAGNOSIS — K769 Liver disease, unspecified: Secondary | ICD-10-CM

## 2017-10-19 NOTE — Telephone Encounter (Signed)
Left message for Dr. Clovia Cuff (patient's cardiologist at [facility] [place]) to request permission to hold Coumadin x 5 days prior to liver biopsy, and for MWA of Liver.  Also, will need instructions as to whether patient will require Lovenox bridge.    Sybella Harnish Riki Rusk, RN 10/19/2017 9:31 AM

## 2017-10-19 NOTE — Telephone Encounter (Signed)
Results of MRI performed 10/15 discussed with the patient and his wife this AM.  Fortunately no additional lesions were identified on the MRI, just the 2 hypermetabolic lesions demonstrated on PET CT performed 9/27.    As previously planned, we will now proceed with US guided liver lesion Bx for the purposes of both obtaining additional tissue for Dr. Marin Olp, as well as for procedural planning purposes for potential microwave ablation.  I am hopeful that I will be able to perform this procedure on an out-pt basis either next Thursday or Friday at [facility] (I'm scheduled at this facility both of these days).  He will need to hold his Coumadin at least 5 days prior to the scheduled Bx.  Patient and his wife are in agreement and demonstrated excellent understanding of the above POC and know to call the IR clinic with any interval questions or concerns.  Zachary Bacon, MD Pager #: [phone]

## 2017-10-26 ENCOUNTER — Other Ambulatory Visit: Payer: Self-pay | Admitting: Radiology

## 2017-10-26 ENCOUNTER — Telehealth (HOSPITAL_COMMUNITY): Payer: Self-pay | Admitting: *Deleted

## 2017-10-26 NOTE — Telephone Encounter (Signed)
Pt wife called regarding instruction for bx tomorrow, 10/27/2017. Reviewed instructions with pt wife, she verbalized understanding. They will report to admitting tomorrow morning at 630am. Wife appreciative of information.

## 2017-10-27 ENCOUNTER — Encounter (HOSPITAL_COMMUNITY): Payer: Self-pay

## 2017-10-27 ENCOUNTER — Ambulatory Visit (HOSPITAL_COMMUNITY)
Admission: RE | Admit: 2017-10-27 | Discharge: 2017-10-27 | Disposition: A | Discharge status: Home / Self Care | Payer: Medicare HMO | Source: Ambulatory Visit | Attending: Interventional Radiology | Admitting: Interventional Radiology

## 2017-10-27 DIAGNOSIS — C787 Secondary malignant neoplasm of liver and intrahepatic bile duct: Secondary | ICD-10-CM | POA: Insufficient documentation

## 2017-10-27 DIAGNOSIS — Z79890 Hormone replacement therapy (postmenopausal): Secondary | ICD-10-CM | POA: Insufficient documentation

## 2017-10-27 DIAGNOSIS — K7689 Other specified diseases of liver: Secondary | ICD-10-CM | POA: Diagnosis present

## 2017-10-27 DIAGNOSIS — Z85038 Personal history of other malignant neoplasm of large intestine: Secondary | ICD-10-CM | POA: Diagnosis not present

## 2017-10-27 DIAGNOSIS — I4891 Unspecified atrial fibrillation: Secondary | ICD-10-CM | POA: Insufficient documentation

## 2017-10-27 DIAGNOSIS — Z9049 Acquired absence of other specified parts of digestive tract: Secondary | ICD-10-CM | POA: Diagnosis not present

## 2017-10-27 DIAGNOSIS — Z7901 Long term (current) use of anticoagulants: Secondary | ICD-10-CM | POA: Insufficient documentation

## 2017-10-27 DIAGNOSIS — K769 Liver disease, unspecified: Secondary | ICD-10-CM

## 2017-10-27 DIAGNOSIS — Z79899 Other long term (current) drug therapy: Secondary | ICD-10-CM | POA: Diagnosis not present

## 2017-10-27 LAB — CBC
HCT: 40.7 % (ref 39.0–52.0)
Hemoglobin: 13 g/dL (ref 13.0–17.0)
MCH: 28 pg (ref 26.0–34.0)
MCHC: 31.9 g/dL (ref 30.0–36.0)
MCV: 87.5 fL (ref 80.0–100.0)
NRBC: 0 % (ref 0.0–0.2)
PLATELETS: 133 10*3/uL — AB (ref 150–400)
RBC: 4.65 MIL/uL (ref 4.22–5.81)
RDW: 14.7 % (ref 11.5–15.5)
WBC: 5.7 10*3/uL (ref 4.0–10.5)

## 2017-10-27 LAB — PROTIME-INR
INR: 1.51
PROTHROMBIN TIME: 18 s — AB (ref 11.4–15.2)

## 2017-10-27 MED ORDER — MIDAZOLAM HCL 2 MG/2ML IJ SOLN
INTRAMUSCULAR | Status: AC | PRN
  Administered 2017-10-27: 1 mg via INTRAVENOUS
  Administered 2017-10-27: 0.5 mg via INTRAVENOUS

## 2017-10-27 MED ORDER — LIDOCAINE-EPINEPHRINE 1 %-1:100000 IJ SOLN
INTRAMUSCULAR | Status: AC
  Filled 2017-10-27: qty 1

## 2017-10-27 MED ORDER — MIDAZOLAM HCL 2 MG/2ML IJ SOLN
INTRAMUSCULAR | Status: AC
  Filled 2017-10-27: qty 2

## 2017-10-27 MED ORDER — FENTANYL CITRATE (PF) 100 MCG/2ML IJ SOLN
INTRAMUSCULAR | Status: AC
  Filled 2017-10-27: qty 2

## 2017-10-27 MED ORDER — SODIUM CHLORIDE 0.9 % IV SOLN: INTRAVENOUS | Status: DC

## 2017-10-27 MED ORDER — FENTANYL CITRATE (PF) 100 MCG/2ML IJ SOLN
INTRAMUSCULAR | Status: AC | PRN
  Administered 2017-10-27: 25 ug via INTRAVENOUS
  Administered 2017-10-27: 50 ug via INTRAVENOUS

## 2017-10-27 MED ORDER — GELATIN ABSORBABLE 12-7 MM EX MISC
CUTANEOUS | Status: AC
  Filled 2017-10-27: qty 1

## 2017-10-27 NOTE — H&P (Signed)
Chief Complaint: Patient was seen in consultation today for No chief complaint on file.  at the request of Dr Marin Olp   Supervising Physician: Sandi Mariscal  Patient Status: [facility] - Out-pt  History of Present Illness: Zachary Willis is a 72 y.o. male   Colon Ca 2017 Liver lesion bx then did result with metastatic cancer-[facility] Chemo and partial hepatectomy 01/1017 CEA followed and noted increase May 2019 CT scan 8/19 revealed 2 new liver lesions Pt was seen with Dr Marin Olp to be closer to home  PET 09/29/17:  IMPRESSION: Status post right hemicolectomy. Two hepatic metastases, as above. No evidence of metastatic disease in the chest. Suspected benign subpleural lymph node in the right middle lobe. Trace bilateral pleural effusions. Hypermetabolic left inferior thyroid nodule, indeterminate. Consider thyroid ultrasound with possible sampling, as clinically warranted.  Was consulted with Dr Pascal Lux 10/8 regarding liver lesion microwave ablation  MRI 10/17/17:  IMPRESSION: 1. Two liver metastases with targetoid enhancement, as detailed. 2. No additional sites of metastatic disease in the abdomen. 3. Mildly enlarged spleen. 4. Trace dependent bilateral pleural effusions, unchanged. 5. Mild diffuse hepatic steatosis  Dr Pascal Lux note 10/8:  Plan:  - Pending results of abdominal MRI, I will perform an ultrasound-guided biopsy of lesion within the subcapsular aspect the right lobe of the liver with sonographic evaluation of the additional caudate lesion.  (Note, patient received cardiac clearance to come off of his Coumadin prior to his liver resection, and as such, his Coumadin will be held a minimum of 5 days prior to ultrasound-guided biopsy). - If lesions are determined to be amenable to microwave ablation, we will submit for insurance approval and arrange for the procedure to be performed at [facility] long hospital with general anesthesia.  The procedure will entail an overnight admission  for observation and PCA usage. * If any of the above plans are altered and transcatheter directed therapy is pursued, I will see the patient in repeat consultation for a more detailed conversation regarding risks and benefits of transcatheter therapy (likely Y 90  Here today for biopsy of liver lesion LD coumadin 10/19  Past Medical History:  Diagnosis Date  . Atrial fibrillation ([place])   . Colon cancer metastasized to liver ([place]) 08/30/2017  . Goals of care, counseling/discussion 08/30/2017    Past Surgical History:  Procedure Laterality Date  . IR RADIOLOGIST EVAL & MGMT  10/10/2017    Allergies: Patient has no known allergies.  Medications: Prior to Admission medications   Medication Sig Start Date End Date Taking? Authorizing Provider  allopurinol (ZYLOPRIM) 300 MG tablet Take 300 mg by mouth daily. 01/28/10  Yes [provider]  doxazosin (CARDURA) 4 MG tablet Take 4 mg by mouth at bedtime.  08/13/17  Yes [provider]  ferrous sulfate 325 (65 FE) MG tablet Take 325 mg by mouth daily.   Yes [provider]  furosemide (LASIX) 20 MG tablet Take 20 mg by mouth daily.    Yes [provider]  hydrocortisone (CORTEF) 10 MG tablet Take 5-15 mg by mouth See admin instructions. Takes 15 mg in am and 5mg  in afternoon. 08/19/17  Yes [provider]  levothyroxine (SYNTHROID, LEVOTHROID) 88 MCG tablet Take 88 mcg by mouth daily before breakfast.  05/15/17  Yes [provider]  loperamide (IMODIUM) 2 MG capsule Take 2 mg by mouth as needed for diarrhea or loose stools.    Yes [provider]  metoprolol succinate (TOPROL-XL) 50 MG 24 hr tablet  Take 50 mg by mouth daily. 04/19/16  Yes [provider]  pantoprazole (PROTONIX) 40 MG tablet Take 40 mg by mouth daily. 05/16/14  Yes [provider]  polyvinyl alcohol (LIQUIFILM TEARS) 1.4 % ophthalmic solution Place 1 drop into both eyes daily as needed for dry eyes.    Yes  [provider]  potassium chloride (KLOR-CON 10) 10 MEQ tablet Take 20 mEq by mouth daily. 11/17/14  Yes [provider]  testosterone (ANDRODERM) 4 MG/24HR PT24 patch Place 2 patches onto the skin daily.  04/17/15  Yes [provider]  NASAL Barnes NA Place 1 spray into the nose daily.     [provider]  prochlorperazine (COMPAZINE) 10 MG tablet Take 10 mg by mouth every 8 (eight) hours as needed for nausea or vomiting.  09/01/16   [provider]  warfarin (COUMADIN) 5 MG tablet Take 5 mg by mouth daily. 07/05/17   [provider]     History reviewed. No pertinent family history.  Social History   Socioeconomic History  . Marital status: Married    Spouse name: Not on file  . Number of children: Not on file  . Years of education: Not on file  . Highest education level: Not on file  Occupational History  . Not on file  Social Needs  . Financial resource strain: Not on file  . Food insecurity:    Worry: Not on file    Inability: Not on file  . Transportation needs:    Medical: Not on file    Non-medical: Not on file  Tobacco Use  . Smoking status: Never Smoker  . Smokeless tobacco: Never Used  Substance and Sexual Activity  . Alcohol use: Not Currently  . Drug use: Never  . Sexual activity: Not on file  Lifestyle  . Physical activity:    Days per week: Not on file    Minutes per session: Not on file  . Stress: Not on file  Relationships  . Social connections:    Talks on phone: Not on file    Gets together: Not on file    Attends religious service: Not on file    Active member of club or organization: Not on file    Attends meetings of clubs or organizations: Not on file    Relationship status: Not on file  Other Topics Concern  . Not on file  Social History Narrative  . Not on file    Review of Systems: A 12 point ROS discussed and pertinent positives are indicated in the HPI above.  All other systems are  negative.  Review of Systems  Constitutional: Positive for fatigue. Negative for activity change and fever.  Respiratory: Negative for cough and shortness of breath.   Cardiovascular: Negative for chest pain.  Gastrointestinal: Negative for abdominal pain.  Musculoskeletal: Negative for back pain.  Neurological: Negative for weakness.  Psychiatric/Behavioral: Negative for behavioral problems and confusion.    Vital Signs: BP 124/85   Pulse 100   Temp 98 F (36.7 C) (Oral)   Resp 16   Ht 5\' 10"  (1.778 m)   Wt 260 lb (117.9 kg)   SpO2 98%   BMI 37.31 kg/m   Physical Exam  Constitutional: He is oriented to person, place, and time.  Cardiovascular: Normal rate, regular rhythm and normal heart sounds.  Pulmonary/Chest: Effort normal and breath sounds normal.  Abdominal: Soft. Bowel sounds are normal. There is no tenderness.  Musculoskeletal: Normal range of  motion.  Neurological: He is alert and oriented to person, place, and time.  Skin: Skin is warm and dry.  Psychiatric: He has a normal mood and affect. His behavior is normal. Judgment and thought content normal.  Vitals reviewed.   Imaging: Mr Abdomen Wwo Contrast  Result Date: 10/17/2017 CLINICAL DATA:  Colon cancer with hypermetabolic liver masses on recent PET-CT. EXAM: MRI ABDOMEN WITHOUT AND WITH CONTRAST TECHNIQUE: Multiplanar multisequence MR imaging of the abdomen was performed both before and after the administration of intravenous contrast. CONTRAST:  10 cc Gadavist IV. COMPARISON:  09/29/2017 PET-CT. FINDINGS: Lower chest: Trace dependent bilateral pleural effusions, unchanged. Hepatobiliary: Normal liver size and configuration. Mild diffuse hepatic steatosis. There are two similar T2 hyperintense liver masses with targetoid enhancement measuring 2.3 x 1.9 cm in the segment 5 right liver lobe (series 4/image 24) and 2.3 x 2.0 cm in the caudate lobe (series 4/image 22). There is a 1.0 cm hemangioma in the far inferior  right liver lobe (series 4/image 33). No additional liver lesions. Cholecystectomy. No biliary ductal dilatation. Common bile duct diameter 3 mm. No choledocholithiasis. Pancreas: No pancreatic mass or duct dilation.  No pancreas divisum. Spleen: Mildly enlarged spleen (craniocaudal splenic length 15.6 cm). No splenic mass. Adrenals/Urinary Tract: Normal adrenals. No hydronephrosis. Simple renal cysts in both kidneys, largest 6.6 cm in the interpolar left kidney. No suspicious renal masses. Stomach/Bowel: Normal non-distended stomach. Visualized small and large bowel is normal caliber, with no bowel wall thickening. Vascular/Lymphatic: Normal caliber abdominal aorta. Patent portal, splenic, hepatic and renal veins. No pathologically enlarged lymph nodes in the abdomen. Other: No abdominal ascites or focal fluid collection. Musculoskeletal: No aggressive appearing focal osseous lesions. IMPRESSION: 1. Two liver metastases with targetoid enhancement, as detailed. 2. No additional sites of metastatic disease in the abdomen. 3. Mildly enlarged spleen. 4. Trace dependent bilateral pleural effusions, unchanged. 5. Mild diffuse hepatic steatosis. Electronically Signed   By: Ilona Sorrel M.D.   On: 10/17/2017 10:04   Nm Pet Image Initial (pi) Skull Base To Thigh  Result Date: 09/30/2017 CLINICAL DATA:  Initial treatment strategy for colon cancer with liver metastases. EXAM: NUCLEAR MEDICINE PET SKULL BASE TO THIGH TECHNIQUE: 13.2 mCi F-18 FDG was injected intravenously. Full-ring PET imaging was performed from the skull base to thigh after the radiotracer. CT data was obtained and used for attenuation correction and anatomic localization. Fasting blood glucose: 124 mg/dl COMPARISON:  None. FINDINGS: Mediastinal blood pool activity: SUV max 3.2 NECK: No hypermetabolic cervical lymphadenopathy. 4.6 cm left inferior thyroid nodule, max SUV 7.4. Incidental CT findings: None. CHEST: No hypermetabolic pulmonary nodules. 5 mm  subpleural triangular nodule in the right middle lobe (series 8/image 41), favoring a benign subpleural lymph node and non FDG avid, although technically beneath the size threshold for PET sensitivity. No hypermetabolic thoracic lymphadenopathy. Incidental CT findings: Trace bilateral pleural effusions. Mild atherosclerotic calcifications of the aortic arch. Three vessel coronary atherosclerosis. Right chest port terminates in the mid SVC. ABDOMEN/PELVIS: 2.1 cm metastasis abutting the caudate (series 4/image 114), max SUV 11.2. 1.8 cm metastasis the junction of segment 5/6 (series 4/image 119), max SUV 11.5. No abnormal hypermetabolism in the pancreas, spleen, or adrenal glands. No hypermetabolic abdominopelvic lymphadenopathy. Status post right hemicolectomy with appendectomy. Incidental CT findings: Status post cholecystectomy. Atherosclerotic calcifications the abdominal aorta and branch vessels. 6.9 cm left upper pole renal cyst. Bladder is thick-walled although underdistended. SKELETON: No focal hypermetabolic activity to suggest skeletal metastasis. Incidental CT findings: Degenerative changes of the  visualized thoracolumbar spine. IMPRESSION: Status post right hemicolectomy. Two hepatic metastases, as above. No evidence of metastatic disease in the chest. Suspected benign subpleural lymph node in the right middle lobe. Trace bilateral pleural effusions. Hypermetabolic left inferior thyroid nodule, indeterminate. Consider thyroid ultrasound with possible sampling, as clinically warranted. Electronically Signed   By: Julian Hy M.D.   On: 09/30/2017 19:48   Ir Radiologist Eval & Mgmt  Result Date: 10/10/2017 Please refer to notes tab for details about interventional procedure. (Op Note)   Labs:  CBC: Recent Labs    08/30/17 1118  WBC 4.9  HGB 12.3*  HCT 38.0*  PLT 117*    COAGS: No results for input(s): INR, APTT in the last 8760 hours.  BMP: Recent Labs    08/30/17 1118  10/17/17 0830  NA 142  --   K 3.9  --   CL 105  --   CO2 29  --   GLUCOSE 177*  --   BUN 13  --   CALCIUM 9.2  --   CREATININE 1.43* 1.40*  GFRNONAA 48*  --   GFRAA 56*  --     LIVER FUNCTION TESTS: Recent Labs    08/30/17 1118  BILITOT 0.6  AST 17  ALT 6  ALKPHOS 98  PROT 6.5  ALBUMIN 3.5    TUMOR MARKERS: No results for input(s): AFPTM, CEA, CA199, CHROMGRNA in the last 8760 hours.  Assessment and Plan:  Hx Colon cancer 2017 + metastasis to liver Chemo and partial hepatectomy 01/2017 New liver lesions-- +PET Scheduled for biopsy of liver lesion-- possible microwave ablation candidate Risks and benefits discussed with the patient including, but not limited to bleeding, infection, damage to adjacent structures or low yield requiring additional tests.  All of the patient's questions were answered, patient is agreeable to proceed. Consent signed and in chart.   Thank you for this interesting consult.  I greatly enjoyed meeting MIECZYSLAW STAMAS and look forward to participating in their care.  A copy of this report was sent to the requesting provider on this date.  Electronically Signed: Lavonia Drafts, PA-C 10/27/2017, 7:38 AM   I spent a total of  30 Minutes   in face to face in clinical consultation, greater than 50% of which was counseling/coordinating care for liver lesion bx

## 2017-10-27 NOTE — Sedation Documentation (Signed)
Bed Rest to start at 0900.

## 2017-10-27 NOTE — Discharge Instructions (Signed)
May resume coumadin tomorrow 10/26  Liver Biopsy, Care After These instructions give you information on caring for yourself after your procedure. Your doctor may also give you more specific instructions. Call your doctor if you have any problems or questions after your procedure. Follow these instructions at home:  Rest at home for 1-2 days or as told by your doctor.  Have someone stay with you for at least 24 hours.  Do not do these things in the first 24 hours: ? Drive. ? Use machinery. ? Take care of other people. ? Sign legal documents. ? Take a bath or shower.  There are many different ways to close and cover a cut (incision). For example, a cut can be closed with stitches, skin glue, or adhesive strips. Follow your doctor's instructions on: ? Taking care of your cut. ? Changing and removing your bandage (dressing). ? Removing whatever was used to close your cut.  Do not drink alcohol in the first week.  Do not lift more than 5 pounds or play contact sports for the first 2 weeks.  Take medicines only as told by your doctor. For 1 week, do not take medicine that has aspirin in it or medicines like ibuprofen.  Get your test results. Contact a doctor if:  A cut bleeds and leaves more than just a small spot of blood.  A cut is red, puffs up (swells), or hurts more than before.  Fluid or something else comes from a cut.  A cut smells bad.  You have a fever or chills. Get help right away if:  You have swelling, bloating, or pain in your belly (abdomen).  You get dizzy or faint.  You have a rash.  You feel sick to your stomach (nauseous) or throw up (vomit).  You have trouble breathing, feel short of breath, or feel faint.  Your chest hurts.  You have problems talking or seeing.  You have trouble balancing or moving your arms or legs. This information is not intended to replace advice given to you by your health care provider. Make sure you discuss any questions  you have with your health care provider. Document Released: 09/29/2007 Document Revised: 05/28/2015 Document Reviewed: 02/15/2013 Elsevier Interactive Patient Education  2018 [place]. Moderate Conscious Sedation, Adult, Care After These instructions provide you with information about caring for yourself after your procedure. Your health care provider may also give you more specific instructions. Your treatment has been planned according to current medical practices, but problems sometimes occur. Call your health care provider if you have any problems or questions after your procedure. What can I expect after the procedure? After your procedure, it is common:  To feel sleepy for several hours.  To feel clumsy and have poor balance for several hours.  To have poor judgment for several hours.  To vomit if you eat too soon.  Follow these instructions at home: For at least 24 hours after the procedure:   Do not: ? Participate in activities where you could fall or become injured. ? Drive. ? Use heavy machinery. ? Drink alcohol. ? Take sleeping pills or medicines that cause drowsiness. ? Make important decisions or sign legal documents. ? Take care of children on your own.  Rest. Eating and drinking  Follow the diet recommended by your health care provider.  If you vomit: ? Drink water, juice, or soup when you can drink without vomiting. ? Make sure you have little or no nausea before eating solid foods.  General instructions  Have a responsible adult stay with you until you are awake and alert.  Take over-the-counter and prescription medicines only as told by your health care provider.  If you smoke, do not smoke without supervision.  Keep all follow-up visits as told by your health care provider. This is important. Contact a health care provider if:  You keep feeling nauseous or you keep vomiting.  You feel light-headed.  You develop a rash.  You have a  fever. Get help right away if:  You have trouble breathing. This information is not intended to replace advice given to you by your health care provider. Make sure you discuss any questions you have with your health care provider. Document Released: 10/10/2012 Document Revised: 05/25/2015 Document Reviewed: 04/11/2015 Elsevier Interactive Patient Education  [employer].

## 2017-10-27 NOTE — Procedures (Signed)
Pre Procedure Dx: Liver lesion  Post Procedural Dx: Same  Technically successful US guided biopsy of indeterminate lesion within the right lobe of the liver.   EBL: None  No immediate complications.   Jay Govind Furey, MD Pager #: [phone]    

## 2017-11-02 ENCOUNTER — Other Ambulatory Visit: Payer: Self-pay | Admitting: Hematology & Oncology

## 2017-11-02 ENCOUNTER — Telehealth: Payer: Self-pay | Admitting: Interventional Radiology

## 2017-11-02 ENCOUNTER — Other Ambulatory Visit (HOSPITAL_COMMUNITY): Payer: Self-pay | Admitting: Interventional Radiology

## 2017-11-02 ENCOUNTER — Telehealth: Payer: Self-pay | Admitting: *Deleted

## 2017-11-02 DIAGNOSIS — C787 Secondary malignant neoplasm of liver and intrahepatic bile duct: Principal | ICD-10-CM

## 2017-11-02 DIAGNOSIS — C189 Malignant neoplasm of colon, unspecified: Secondary | ICD-10-CM

## 2017-11-02 NOTE — Telephone Encounter (Signed)
Message received from [place] with [facility] stating that pt is scheduled for an ablation on 11/20 by Dr. Pascal Lux and would like an order from Dr. Marin Olp for pt to hold coumadin 5 days prior to ablation.  OK to leave order on Epic per Jocelyn Lamer.  Order received from Dr. Marin Olp to hold Coumadin 5 days prior to ablation.

## 2017-11-02 NOTE — Telephone Encounter (Signed)
Discussed results of the ultrasound-guided biopsy with the patient and the patient's wife.  Not surprisingly, biopsy results were compatible with metastatic colon cancer.  Hopefully enough tissue was acquired to allow for any additional molecular testing as per Dr. Marin Olp.  Discussed potential treatment options including proceeding with microwave ablation versus palliative transcatheter treatment options.  While the lesion with the more central aspect of the liver was not visualized sonographically, I feel that there is adequate visualization of the lesion on the noncontrast PET images further abated by internal anatomic landmarks.  Additionally, I feel there is enough distance between the lesion and the central biliary tree to avoid a central biliary injury.  Following this prolonged a detailed conversation, the patient wishes to pursue microwave ablation therapy.    As such we will begin the process of scheduling this procedure.  Again this procedure will require a 5-day hold of his Coumadin.  In the meantime, the patient will return to the interventional radiology clinic next Tuesday (November 5) for repeat consultation.  Patient knows to call the interventional radiology clinic with any interval questions or concerns.  Ronny Bacon, MD Pager #: [phone]

## 2017-11-06 ENCOUNTER — Other Ambulatory Visit (HOSPITAL_COMMUNITY): Payer: Self-pay | Admitting: Interventional Radiology

## 2017-11-06 DIAGNOSIS — C787 Secondary malignant neoplasm of liver and intrahepatic bile duct: Principal | ICD-10-CM

## 2017-11-06 DIAGNOSIS — C189 Malignant neoplasm of colon, unspecified: Secondary | ICD-10-CM

## 2017-11-07 ENCOUNTER — Ambulatory Visit
Admission: RE | Admit: 2017-11-07 | Discharge: 2017-11-07 | Disposition: A | Discharge status: Home / Self Care | Payer: Medicare HMO | Source: Ambulatory Visit | Attending: Interventional Radiology | Admitting: Interventional Radiology

## 2017-11-07 ENCOUNTER — Encounter: Payer: Self-pay | Admitting: Radiology

## 2017-11-07 DIAGNOSIS — C189 Malignant neoplasm of colon, unspecified: Secondary | ICD-10-CM

## 2017-11-07 DIAGNOSIS — C787 Secondary malignant neoplasm of liver and intrahepatic bile duct: Principal | ICD-10-CM

## 2017-11-07 HISTORY — PX: IR RADIOLOGIST EVAL & MGMT: IMG5224

## 2017-11-07 NOTE — Progress Notes (Signed)
Patient ID: Zachary Willis, male   DOB: 1945/02/10, 72 y.o.   MRN: [id number]         Chief Complaint: Metastatic colon cancer  Referring Physician(s): Ennever (Oncology)  History of Present Illness:  Zachary Willis is a 72 y.o. male with past medical history significant for atrial fibrillation (on chronic Coumadin), pituitary tumor post resection, on chronic steroid supplementation, hypertension, hypothyroidism who was diagnosed with colon cancer in December 2017, after he presented with colonic obstruction in the [facility].  He subsequent underwent a hemicolectomy followed by neoadjuvant chemotherapy with FOLFOX and ultimately partial hepatectomy (January 2019) with subsequent adjuvant FOLFOX receiving a total of 11 cycles.  By patient report, post resection imaging performed in May of this year was negative for evidence of disease however CEA levels were found to increase again earlier this summer with subsequent outside CT scan performed in August demonstrated 2 liver lesions worrisome for new areas of metastatic disease.  The patient has subsequently transferred his care to Dr. Marin Willis who ordered a PET scan which was performed on 09/29/2017 and confirmed both liver lesions to be hypermetabolic.  Patient was initially seen in consultation at the interventional radiology clinic on 10/10/2017.  Patient underwent abdominal MRI on 10/17/2017 confirming both liver lesions and was negative for evidence of additional sites of disease.  Patient underwent ultrasound-guided liver lesion biopsy on 10/27/2017 of the more peripheral lesion with the right lobe of the liver confirming metastatic colon cancer.    Despite exhaustive evaluation, the additional lesion with some more central aspect the right lobe of the liver was not identified sonographically.  The patient and his wife return today to the interventional radiology clinic to discuss potential liver directed  therapies.     Past Medical History:  Diagnosis Date  . Atrial fibrillation ([place])   . Colon cancer metastasized to liver ([place]) 08/30/2017  . Goals of care, counseling/discussion 08/30/2017    Past Surgical History:  Procedure Laterality Date  . IR RADIOLOGIST EVAL & MGMT  10/10/2017  . IR RADIOLOGIST EVAL & MGMT  11/07/2017    Allergies: Patient has no known allergies.  Medications: Prior to Admission medications   Medication Sig Start Date End Date Taking? Authorizing Provider  allopurinol (ZYLOPRIM) 300 MG tablet Take 300 mg by mouth daily. 01/28/10   [provider]  doxazosin (CARDURA) 4 MG tablet Take 4 mg by mouth at bedtime.  08/13/17   [provider]  ferrous sulfate 325 (65 FE) MG tablet Take 325 mg by mouth daily.    [provider]  furosemide (LASIX) 20 MG tablet Take 20 mg by mouth daily.     [provider]  hydrocortisone (CORTEF) 10 MG tablet Take 5-15 mg by mouth See admin instructions. Takes 15 mg in am and 5mg  in afternoon. 08/19/17   [provider]  levothyroxine (SYNTHROID, LEVOTHROID) 88 MCG tablet Take 88 mcg by mouth daily before breakfast.  05/15/17   [provider]  loperamide (IMODIUM) 2 MG capsule Take 2 mg by mouth as needed for diarrhea or loose stools.     [provider]  metoprolol succinate (TOPROL-XL) 50 MG 24 hr tablet Take 50 mg by mouth daily. 04/19/16   [provider]  NASAL [place] NA Place 1 spray into the nose daily.     [provider]  pantoprazole (PROTONIX) 40 MG tablet Take 40 mg by mouth daily. 05/16/14   [provider]  polyvinyl alcohol (LIQUIFILM TEARS)  1.4 % ophthalmic solution Place 1 drop into both eyes daily as needed for dry eyes.     [provider]  potassium chloride (KLOR-CON 10) 10 MEQ tablet Take 20 mEq by mouth daily. 11/17/14   [provider]  prochlorperazine (COMPAZINE) 10 MG tablet Take 10 mg by mouth every 8  (eight) hours as needed for nausea or vomiting.  09/01/16   [provider]  testosterone (ANDRODERM) 4 MG/24HR PT24 patch Place 2 patches onto the skin daily.  04/17/15   [provider]  warfarin (COUMADIN) 5 MG tablet Take 5 mg by mouth daily. 07/05/17   [provider]     No family history on file.  Social History   Socioeconomic History  . Marital status: Married    Spouse name: Not on file  . Number of children: Not on file  . Years of education: Not on file  . Highest education level: Not on file  Occupational History  . Not on file  Social Needs  . Financial resource strain: Not on file  . Food insecurity:    Worry: Not on file    Inability: Not on file  . Transportation needs:    Medical: Not on file    Non-medical: Not on file  Tobacco Use  . Smoking status: Never Smoker  . Smokeless tobacco: Never Used  Substance and Sexual Activity  . Alcohol use: Not Currently  . Drug use: Never  . Sexual activity: Not on file  Lifestyle  . Physical activity:    Days per week: Not on file    Minutes per session: Not on file  . Stress: Not on file  Relationships  . Social connections:    Talks on phone: Not on file    Gets together: Not on file    Attends religious service: Not on file    Active member of club or organization: Not on file    Attends meetings of clubs or organizations: Not on file    Relationship status: Not on file  Other Topics Concern  . Not on file  Social History Narrative  . Not on file    ECOG Status: 1 - Symptomatic but completely ambulatory  Review of Systems: A 12 point ROS discussed and pertinent positives are indicated in the HPI above.  All other systems are negative.  Review of Systems  Vital Signs: BP 131/65   Pulse 63   Temp 98.2 F (36.8 C) (Oral)   Resp 17   Ht 5\' 10"  (1.778 m)   Wt 117.9 kg   SpO2 99%   BMI 37.31 kg/m   Physical Exam    Imaging:  Selected images from PET/CT performed  09/29/2017; abdominal MRI performed 10/17/2017 and ultrasound-guided liver lesion biopsy performed 10/27/2017 were reviewed in detail with the patient and the patient's wife.  Mr Abdomen Wwo Contrast  Result Date: 10/17/2017 CLINICAL DATA:  Colon cancer with hypermetabolic liver masses on recent PET-CT. EXAM: MRI ABDOMEN WITHOUT AND WITH CONTRAST TECHNIQUE: Multiplanar multisequence MR imaging of the abdomen was performed both before and after the administration of intravenous contrast. CONTRAST:  10 cc Gadavist IV. COMPARISON:  09/29/2017 PET-CT. FINDINGS: Lower chest: Trace dependent bilateral pleural effusions, unchanged. Hepatobiliary: Normal liver size and configuration. Mild diffuse hepatic steatosis. There are two similar T2 hyperintense liver masses with targetoid enhancement measuring 2.3 x 1.9 cm in the segment 5 right liver lobe (series 4/image 24) and 2.3 x 2.0 cm in the caudate lobe (series  4/image 22). There is a 1.0 cm hemangioma in the far inferior right liver lobe (series 4/image 33). No additional liver lesions. Cholecystectomy. No biliary ductal dilatation. Common bile duct diameter 3 mm. No choledocholithiasis. Pancreas: No pancreatic mass or duct dilation.  No pancreas divisum. Spleen: Mildly enlarged spleen (craniocaudal splenic length 15.6 cm). No splenic mass. Adrenals/Urinary Tract: Normal adrenals. No hydronephrosis. Simple renal cysts in both kidneys, largest 6.6 cm in the interpolar left kidney. No suspicious renal masses. Stomach/Bowel: Normal non-distended stomach. Visualized small and large bowel is normal caliber, with no bowel wall thickening. Vascular/Lymphatic: Normal caliber abdominal aorta. Patent portal, splenic, hepatic and renal veins. No pathologically enlarged lymph nodes in the abdomen. Other: No abdominal ascites or focal fluid collection. Musculoskeletal: No aggressive appearing focal osseous lesions. IMPRESSION: 1. Two liver metastases with targetoid enhancement, as  detailed. 2. No additional sites of metastatic disease in the abdomen. 3. Mildly enlarged spleen. 4. Trace dependent bilateral pleural effusions, unchanged. 5. Mild diffuse hepatic steatosis. Electronically Signed   By: Ilona Sorrel M.D.   On: 10/17/2017 10:04   US Biopsy (liver)  Result Date: 10/27/2017 INDICATION: History of metastatic colon cancer, now with indeterminate liver lesions worrisome for progression metastatic disease. Please perform ultrasound-guided liver lesion biopsy for tissue diagnostic purposes. EXAM: ULTRASOUND GUIDED LIVER LESION BIOPSY COMPARISON:  Abdominal MRI-10/17/2017; PET-CT-09/29/2017 MEDICATIONS: None ANESTHESIA/SEDATION: Fentanyl 75 mcg IV; Versed 1.5 mg IV Total Moderate Sedation time:  12 Minutes. The patient's level of consciousness and vital signs were monitored continuously by radiology nursing throughout the procedure under my direct supervision. COMPLICATIONS: None immediate. PROCEDURE: Informed written consent was obtained from the patient after a discussion of the risks, benefits and alternatives to treatment. The patient understands and consents the procedure. A timeout was performed prior to the initiation of the procedure. Ultrasound scanning was performed of the right upper abdominal quadrant demonstrates an approximately 2.7 x 1.6 cm hypo colic lesion within subcapsular aspect of the right lobe of the liver correlating with the hypermetabolic lesions seen on preceding PET-CT image 119, series 4). Exhaustive sonographic evaluation was performed however the additional lesion within the anterior aspect of the caudate was not definitively identified sonographically. The dominant lesion within subcapsular aspect was targeted for biopsy given lesion location and sonographic window. The procedure was planned. The right upper abdominal quadrant was prepped and draped in the usual sterile fashion. The overlying soft tissues were anesthetized with 1% lidocaine with  epinephrine. A 17 gauge, 6.8 cm co-axial needle was advanced into a peripheral aspect of the lesion. This was followed by 4 core biopsies with an 18 gauge core device under direct ultrasound guidance. The coaxial needle tract was embolized with a small amount of Gel-Foam slurry and superficial hemostasis was obtained with manual compression. Post procedural scanning was negative for definitive area of hemorrhage or additional complication. A dressing was placed. The patient tolerated the procedure well without immediate post procedural complication. IMPRESSION: 1. Technically successful ultrasound guided core needle biopsy of indeterminate lesion within the subcapsular aspect the right lobe of the liver. 2. Despite exhaustive sonographic evaluation, the additional lesion with the anterior aspect of caudate was not definitively identified sonographically. Electronically Signed   By: Sandi Mariscal M.D.   On: 10/27/2017 10:06   Ir Radiologist Eval & Mgmt  Result Date: 11/07/2017 Please refer to notes tab for details about interventional procedure. (Op Note)  Ir Radiologist Eval & Mgmt  Result Date: 10/10/2017 Please refer to notes tab for details about interventional  procedure. (Op Note)   Labs:  CBC: Recent Labs    08/30/17 1118 10/27/17 0651  WBC 4.9 5.7  HGB 12.3* 13.0  HCT 38.0* 40.7  PLT 117* 133*    COAGS: Recent Labs    10/27/17 0651  INR 1.51    BMP: Recent Labs    08/30/17 1118 10/17/17 0830  NA 142  --   K 3.9  --   CL 105  --   CO2 29  --   GLUCOSE 177*  --   BUN 13  --   CALCIUM 9.2  --   CREATININE 1.43* 1.40*  GFRNONAA 48*  --   GFRAA 56*  --     LIVER FUNCTION TESTS: Recent Labs    08/30/17 1118  BILITOT 0.6  AST 17  ALT 6  ALKPHOS 98  PROT 6.5  ALBUMIN 3.5    TUMOR MARKERS: No results for input(s): AFPTM, CEA, CA199, CHROMGRNA in the last 8760 hours.  Assessment and Plan:  RAIMUNDO CORBIT is a 72 y.o. male with past medical history significant  for atrial fibrillation (on chronic Coumadin), pituitary tumor post resection, on chronic steroid supplementation, hypertension, hypothyroidism who was diagnosed with metastatic colon cancer.  Selected images from PET/CT performed 09/29/2017; abdominal MRI performed 10/17/2017 and ultrasound-guided liver lesion biopsy performed 10/27/2017 were reviewed in detail with the patient and the patient's wife.  I explained that percutaneous microwave ablation would only be offered if we could ablate both lesions and would be performed with curative intent of eradicating these 2 solitary sites of metastatic disease.   I explained that while the lesion within the central aspect of the liver is not identified sonographically, it is fairly well seen on the noncontrast images from preceding PET/CT.  I also explained that biliary tree (of primary concern with ablating a central liver lesion) is somewhat protected by the interposed portal vein.  This was explicitly demonstrated to the patient on the abdominal MRI.    Additionally, I explained that while the lesion with the peripheral aspect the right lobe of the liver is well visualized sonographically, ablating peripheral capsular lesions can result in postprocedural discomfort.  I explained that transcatheter treatment options he is a candidate for transcatheter treatment options however these are performed for palliative purposes in hopes of achieving disease stability to improvement for some duration of time.  Following this prolonged a detailed conversation, the patient and his wife both wish to proceed with image guided hepatic microwave ablation.  As such, this procedure will be scheduled for later this month (tentatively scheduled for November 20) at [facility]  As similar to the biopsy, the patient will hold his Coumadin 5 days prior to the ablation.  The procedure will entail an overnight admission for continued observation and PCA usage.    The  patient and his wife know to call the interventional radiology clinic with any interval questions or concerns.   Thank you for this interesting consult.  I greatly enjoyed meeting ERSKIN ZINDA and look forward to participating in their care.  A copy of this report was sent to the requesting provider on this date.  Electronically Signed: Sandi Mariscal 11/07/2017, 11:52 AM   I spent a total of 15 Minutes in face to face in clinical consultation, greater than 50% of which was counseling/coordinating care for metastatic colon cancer

## 2017-11-14 ENCOUNTER — Encounter (HOSPITAL_COMMUNITY): Payer: Self-pay

## 2017-11-14 NOTE — Patient Instructions (Signed)
Zachary Willis  [birth year]    Your procedure is scheduled on:  11-22-2017   Report to [facility] Main  Entrance,  Report to admitting at  6:45 AM    Call this number if you have problems the morning of surgery [phone]      Remember: Do not eat food or drink liquids :After Midnight.                                       BRUSH YOUR TEETH MORNING OF SURGERY AND RINSE YOUR MOUTH OUT, NO CHEWING GUM CANDY OR MINTS.     Take these medicines the morning of surgery with A SIP OF WATER:  Metoprolol, Hydrocortisone, Allopurinol, Levothyroxine, Pantoprazole                                 You may not have any metal on your body including hair pins and piercings              Do not wear jewelry, make-up, lotions, powders or perfumes, deodorant                          Men may shave face and neck.   Do not bring valuables to the hospital. [place].  Contacts, dentures or bridgework may not be worn into surgery.  Leave suitcase in the car. After surgery it may be brought to your room.      Special Instructions:  FOLLOW DR WATTS INSTRUCTIONS FOR COUMADIN               _____________________________________________________________________             [facility] - Preparing for Surgery Before surgery, you can play an important role.  Because skin is not sterile, your skin needs to be as free of germs as possible.  You can reduce the number of germs on your skin by washing with CHG (chlorahexidine gluconate) soap before surgery.  CHG is an antiseptic cleaner which kills germs and bonds with the skin to continue killing germs even after washing. Please DO NOT use if you have an allergy to CHG or antibacterial soaps.  If your skin becomes reddened/irritated stop using the CHG and inform your nurse when you arrive at Short Stay. Do not shave (including legs and underarms) for at least 48 hours prior to the first  CHG shower.  You may shave your face/neck. Please follow these instructions carefully:  1.  Shower with CHG Soap the night before surgery and the  morning of Surgery.  2.  If you choose to wash your hair, wash your hair first as usual with your  normal  shampoo.  3.  After you shampoo, rinse your hair and body thoroughly to remove the  shampoo.                          4.  Use CHG as you would any other liquid soap.  You can apply chg directly  to the skin and wash  Gently with a scrungie or clean washcloth.  5.  Apply the CHG Soap to your body ONLY FROM THE NECK DOWN.   Do not use on face/ open                           Wound or open sores. Avoid contact with eyes, ears mouth and genitals (private parts).                       Wash face,  Genitals (private parts) with your normal soap.             6.  Wash thoroughly, paying special attention to the area where your surgery  will be performed.  7.  Thoroughly rinse your body with warm water from the neck down.  8.  DO NOT shower/wash with your normal soap after using and rinsing off  the CHG Soap.             9.  Pat yourself dry with a clean towel.            10.  Wear clean pajamas.            11.  Place clean sheets on your bed the night of your first shower and do not  sleep with pets. Day of Surgery : Do not apply any lotions/deodorants the morning of surgery.  Please wear clean clothes to the hospital/surgery center.  FAILURE TO FOLLOW THESE INSTRUCTIONS MAY RESULT IN THE CANCELLATION OF YOUR SURGERY PATIENT SIGNATURE_________________________________  NURSE SIGNATURE__________________________________  ________________________________________________________________________

## 2017-11-15 ENCOUNTER — Encounter (HOSPITAL_COMMUNITY): Payer: Self-pay

## 2017-11-15 ENCOUNTER — Encounter (HOSPITAL_COMMUNITY)
Admission: RE | Admit: 2017-11-15 | Discharge: 2017-11-15 | Disposition: A | Discharge status: Home / Self Care | Payer: Medicare HMO | Source: Ambulatory Visit | Attending: Interventional Radiology | Admitting: Interventional Radiology

## 2017-11-15 ENCOUNTER — Other Ambulatory Visit: Payer: Self-pay

## 2017-11-15 DIAGNOSIS — Z01812 Encounter for preprocedural laboratory examination: Secondary | ICD-10-CM | POA: Diagnosis present

## 2017-11-15 HISTORY — DX: Chronic atrial fibrillation, unspecified: I48.20

## 2017-11-15 HISTORY — DX: Other adrenocortical insufficiency: E27.49

## 2017-11-15 HISTORY — DX: Anemia due to antineoplastic chemotherapy: T45.1X5A

## 2017-11-15 HISTORY — DX: Adverse effect of antineoplastic and immunosuppressive drugs, initial encounter: D64.81

## 2017-11-15 HISTORY — DX: Personal history of irradiation: Z92.3

## 2017-11-15 HISTORY — DX: Testicular hypofunction: E29.1

## 2017-11-15 HISTORY — DX: Unspecified osteoarthritis, unspecified site: M19.90

## 2017-11-15 HISTORY — DX: Presence of spectacles and contact lenses: Z97.3

## 2017-11-15 HISTORY — DX: Long term (current) use of anticoagulants: Z79.01

## 2017-11-15 HISTORY — DX: Gout, unspecified: M10.9

## 2017-11-15 HISTORY — DX: Chronic kidney disease, stage 2 (mild): N18.2

## 2017-11-15 HISTORY — DX: Unspecified ptosis of right eyelid: H02.401

## 2017-11-15 HISTORY — DX: Gastro-esophageal reflux disease without esophagitis: K21.9

## 2017-11-15 HISTORY — DX: Pituitary-dependent Cushing's disease: E24.0

## 2017-11-15 HISTORY — DX: Personal history of antineoplastic chemotherapy: Z92.21

## 2017-11-15 HISTORY — DX: Other specified hypothyroidism: E03.8

## 2017-11-15 HISTORY — DX: Hypertensive heart disease without heart failure: I11.9

## 2017-11-15 HISTORY — DX: Personal history of other benign neoplasm: Z86.018

## 2017-11-15 HISTORY — DX: Presence of dental prosthetic device (complete) (partial): Z97.2

## 2017-11-15 LAB — COMPREHENSIVE METABOLIC PANEL
ALT: 12 U/L (ref 0–44)
AST: 22 U/L (ref 15–41)
Albumin: 4 g/dL (ref 3.5–5.0)
Alkaline Phosphatase: 88 U/L (ref 38–126)
Anion gap: 8 (ref 5–15)
BILIRUBIN TOTAL: 1.2 mg/dL (ref 0.3–1.2)
BUN: 22 mg/dL (ref 8–23)
CHLORIDE: 105 mmol/L (ref 98–111)
CO2: 29 mmol/L (ref 22–32)
CREATININE: 1.48 mg/dL — AB (ref 0.61–1.24)
Calcium: 9.2 mg/dL (ref 8.9–10.3)
GFR, EST AFRICAN AMERICAN: 54 mL/min — AB (ref >60)
GFR, EST NON AFRICAN AMERICAN: 46 mL/min — AB (ref >60)
Glucose, Bld: 125 mg/dL — ABNORMAL HIGH (ref 70–99)
Potassium: 4.4 mmol/L (ref 3.5–5.1)
Sodium: 142 mmol/L (ref 135–145)
TOTAL PROTEIN: 7.1 g/dL (ref 6.5–8.1)

## 2017-11-15 LAB — CBC
HEMATOCRIT: 42.4 % (ref 39.0–52.0)
Hemoglobin: 13.5 g/dL (ref 13.0–17.0)
MCH: 28.4 pg (ref 26.0–34.0)
MCHC: 31.8 g/dL (ref 30.0–36.0)
MCV: 89.1 fL (ref 80.0–100.0)
PLATELETS: 141 10*3/uL — AB (ref 150–400)
RBC: 4.76 MIL/uL (ref 4.22–5.81)
RDW: 14.6 % (ref 11.5–15.5)
WBC: 5.9 10*3/uL (ref 4.0–10.5)
nRBC: 0 % (ref 0.0–0.2)

## 2017-11-15 NOTE — Progress Notes (Addendum)
Requested via fax EKG dated 04-18-2017 in care everywhere , from [facility].  Pt cardiologist, dr Curly Rim, lov note in epic , dated 04-18-2017.   Pt endocrinologist, dr Elisabeth Most, lov note in epic, dated 05-15-2017.  Pt will need PT/INR am dos per anesthesia guidelines, was not time for pt to stop at PAT appointment.     ADDENDUM:  Received via fax pt EKG requested and placed with chart.

## 2017-11-17 ENCOUNTER — Other Ambulatory Visit: Payer: Self-pay | Admitting: Radiology

## 2017-11-21 ENCOUNTER — Other Ambulatory Visit: Payer: Self-pay | Admitting: Radiology

## 2017-11-21 NOTE — Anesthesia Preprocedure Evaluation (Addendum)
Anesthesia Evaluation  Patient identified by MRN, date of birth, ID band Patient awake    Reviewed: Allergy & Precautions, NPO status , Patient's Chart, lab work & pertinent test results, reviewed documented beta blocker date and time   Airway Mallampati: II  TM Distance: >3 FB Neck ROM: Full    Dental  (+) Dental Advisory Given   Pulmonary neg pulmonary ROS,    Pulmonary exam normal breath sounds clear to auscultation       Cardiovascular Pt. on home beta blockers Normal cardiovascular exam Rhythm:Regular Rate:Normal     Neuro/Psych negative neurological ROS  negative psych ROS   GI/Hepatic Neg liver ROS, GERD  ,  Endo/Other  Hypothyroidism   Renal/GU Renal disease     Musculoskeletal  (+) Arthritis ,   Abdominal   Peds  Hematology negative hematology ROS (+)   Anesthesia Other Findings   Reproductive/Obstetrics negative OB ROS                            Anesthesia Physical Anesthesia Plan  ASA: III  Anesthesia Plan: General   Post-op Pain Management:    Induction: Intravenous  PONV Risk Score and Plan: 2 and Ondansetron, Dexamethasone and Treatment may vary due to age or medical condition  Airway Management Planned: Oral ETT  Additional Equipment: None  Intra-op Plan:   Post-operative Plan: Extubation in OR  Informed Consent: I have reviewed the patients History and Physical, chart, labs and discussed the procedure including the risks, benefits and alternatives for the proposed anesthesia with the patient or authorized representative who has indicated his/her understanding and acceptance.   Dental advisory given  Plan Discussed with: CRNA  Anesthesia Plan Comments:         Anesthesia Quick Evaluation

## 2017-11-22 ENCOUNTER — Other Ambulatory Visit: Payer: Self-pay

## 2017-11-22 ENCOUNTER — Encounter (HOSPITAL_COMMUNITY)
Admission: RE | Disposition: A | Discharge status: Home / Self Care | Payer: Self-pay | Source: Ambulatory Visit | Attending: Interventional Radiology

## 2017-11-22 ENCOUNTER — Observation Stay (HOSPITAL_COMMUNITY)
Admission: RE | Admit: 2017-11-22 | Discharge: 2017-11-23 | Disposition: A | Discharge status: Home / Self Care | Payer: Medicare HMO | Source: Ambulatory Visit | Attending: Interventional Radiology | Admitting: Interventional Radiology

## 2017-11-22 ENCOUNTER — Encounter (HOSPITAL_COMMUNITY): Payer: Self-pay

## 2017-11-22 ENCOUNTER — Ambulatory Visit (HOSPITAL_COMMUNITY): Payer: Medicare HMO

## 2017-11-22 ENCOUNTER — Ambulatory Visit (HOSPITAL_COMMUNITY): Payer: Medicare HMO | Admitting: Anesthesiology

## 2017-11-22 ENCOUNTER — Ambulatory Visit (HOSPITAL_COMMUNITY)
Admission: RE | Admit: 2017-11-22 | Discharge: 2017-11-22 | Disposition: A | Discharge status: Home / Self Care | Payer: Medicare HMO | Source: Ambulatory Visit | Attending: Interventional Radiology | Admitting: Interventional Radiology

## 2017-11-22 DIAGNOSIS — C189 Malignant neoplasm of colon, unspecified: Secondary | ICD-10-CM | POA: Diagnosis present

## 2017-11-22 DIAGNOSIS — Z7901 Long term (current) use of anticoagulants: Secondary | ICD-10-CM | POA: Diagnosis not present

## 2017-11-22 DIAGNOSIS — M1990 Unspecified osteoarthritis, unspecified site: Secondary | ICD-10-CM | POA: Insufficient documentation

## 2017-11-22 DIAGNOSIS — Z79899 Other long term (current) drug therapy: Secondary | ICD-10-CM | POA: Diagnosis not present

## 2017-11-22 DIAGNOSIS — I129 Hypertensive chronic kidney disease with stage 1 through stage 4 chronic kidney disease, or unspecified chronic kidney disease: Secondary | ICD-10-CM | POA: Insufficient documentation

## 2017-11-22 DIAGNOSIS — Z79890 Hormone replacement therapy (postmenopausal): Secondary | ICD-10-CM | POA: Insufficient documentation

## 2017-11-22 DIAGNOSIS — Z9221 Personal history of antineoplastic chemotherapy: Secondary | ICD-10-CM | POA: Diagnosis not present

## 2017-11-22 DIAGNOSIS — Z923 Personal history of irradiation: Secondary | ICD-10-CM | POA: Diagnosis not present

## 2017-11-22 DIAGNOSIS — N182 Chronic kidney disease, stage 2 (mild): Secondary | ICD-10-CM | POA: Insufficient documentation

## 2017-11-22 DIAGNOSIS — E039 Hypothyroidism, unspecified: Secondary | ICD-10-CM | POA: Diagnosis not present

## 2017-11-22 DIAGNOSIS — Z01818 Encounter for other preprocedural examination: Secondary | ICD-10-CM

## 2017-11-22 DIAGNOSIS — C787 Secondary malignant neoplasm of liver and intrahepatic bile duct: Secondary | ICD-10-CM | POA: Diagnosis not present

## 2017-11-22 HISTORY — PX: RADIOFREQUENCY ABLATION: SHX2290

## 2017-11-22 LAB — CBC WITH DIFFERENTIAL/PLATELET
ABS IMMATURE GRANULOCYTES: 0.04 10*3/uL (ref 0.00–0.07)
BASOS PCT: 1 %
Basophils Absolute: 0.1 10*3/uL (ref 0.0–0.1)
Eosinophils Absolute: 0.3 10*3/uL (ref 0.0–0.5)
Eosinophils Relative: 4 %
HCT: 41.4 % (ref 39.0–52.0)
Hemoglobin: 13.1 g/dL (ref 13.0–17.0)
IMMATURE GRANULOCYTES: 1 %
Lymphocytes Relative: 31 %
Lymphs Abs: 2.1 10*3/uL (ref 0.7–4.0)
MCH: 28.9 pg (ref 26.0–34.0)
MCHC: 31.6 g/dL (ref 30.0–36.0)
MCV: 91.2 fL (ref 80.0–100.0)
Monocytes Absolute: 0.5 10*3/uL (ref 0.1–1.0)
Monocytes Relative: 8 %
NEUTROS ABS: 3.7 10*3/uL (ref 1.7–7.7)
NEUTROS PCT: 55 %
PLATELETS: 128 10*3/uL — AB (ref 150–400)
RBC: 4.54 MIL/uL (ref 4.22–5.81)
RDW: 14.9 % (ref 11.5–15.5)
WBC: 6.8 10*3/uL (ref 4.0–10.5)
nRBC: 0 % (ref 0.0–0.2)

## 2017-11-22 LAB — COMPREHENSIVE METABOLIC PANEL
ALT: 9 U/L (ref 0–44)
ANION GAP: 8 (ref 5–15)
AST: 22 U/L (ref 15–41)
Albumin: 3.9 g/dL (ref 3.5–5.0)
Alkaline Phosphatase: 88 U/L (ref 38–126)
BUN: 19 mg/dL (ref 8–23)
CO2: 27 mmol/L (ref 22–32)
Calcium: 8.9 mg/dL (ref 8.9–10.3)
Chloride: 104 mmol/L (ref 98–111)
Creatinine, Ser: 1.32 mg/dL — ABNORMAL HIGH (ref 0.61–1.24)
GFR, EST NON AFRICAN AMERICAN: 53 mL/min — AB (ref >60)
GLUCOSE: 130 mg/dL — AB (ref 70–99)
Potassium: 3.5 mmol/L (ref 3.5–5.1)
SODIUM: 139 mmol/L (ref 135–145)
Total Bilirubin: 1.1 mg/dL (ref 0.3–1.2)
Total Protein: 6.9 g/dL (ref 6.5–8.1)

## 2017-11-22 LAB — PROTIME-INR
INR: 1.07
Prothrombin Time: 13.8 seconds (ref 11.4–15.2)

## 2017-11-22 LAB — ABO/RH: ABO/RH(D): O POS

## 2017-11-22 LAB — TYPE AND SCREEN
ABO/RH(D): O POS
Antibody Screen: NEGATIVE

## 2017-11-22 SURGERY — RADIO FREQUENCY ABLATION
Anesthesia: General

## 2017-11-22 MED ORDER — DIPHENHYDRAMINE HCL 50 MG/ML IJ SOLN: 12.5000 mg | Freq: Four times a day (QID) | INTRAMUSCULAR | Status: DC | PRN

## 2017-11-22 MED ORDER — DIPHENHYDRAMINE HCL 12.5 MG/5ML PO ELIX
12.5000 mg | ORAL_SOLUTION | Freq: Four times a day (QID) | ORAL | Status: DC | PRN
  Filled 2017-11-22: qty 5

## 2017-11-22 MED ORDER — PANTOPRAZOLE SODIUM 40 MG PO TBEC
40.0000 mg | DELAYED_RELEASE_TABLET | Freq: Every morning | ORAL | Status: DC
  Administered 2017-11-23: 40 mg via ORAL
  Filled 2017-11-22 (×2): qty 1

## 2017-11-22 MED ORDER — PIPERACILLIN-TAZOBACTAM 3.375 G IVPB: 3.3750 g | Freq: Once | INTRAVENOUS | Status: DC

## 2017-11-22 MED ORDER — DOXAZOSIN MESYLATE 4 MG PO TABS
4.0000 mg | ORAL_TABLET | Freq: Every day | ORAL | Status: DC
  Administered 2017-11-22: 4 mg via ORAL
  Filled 2017-11-22: qty 1
  Filled 2017-11-22: qty 2

## 2017-11-22 MED ORDER — IOPAMIDOL (ISOVUE-370) INJECTION 76%
100.0000 mL | Freq: Once | INTRAVENOUS | Status: AC | PRN
  Administered 2017-11-22: 50 mL via INTRAVENOUS

## 2017-11-22 MED ORDER — SODIUM CHLORIDE 0.9% FLUSH: 9.0000 mL | INTRAVENOUS | Status: DC | PRN

## 2017-11-22 MED ORDER — ONDANSETRON HCL 4 MG/2ML IJ SOLN
INTRAMUSCULAR | Status: DC | PRN
  Administered 2017-11-22: 4 mg via INTRAVENOUS

## 2017-11-22 MED ORDER — HYDROCORTISONE NA SUCCINATE PF 100 MG IJ SOLR
50.0000 mg | Freq: Once | INTRAMUSCULAR | Status: AC
  Administered 2017-11-22: 50 mg via INTRAVENOUS

## 2017-11-22 MED ORDER — NALOXONE HCL 0.4 MG/ML IJ SOLN: 0.4000 mg | INTRAMUSCULAR | Status: DC | PRN

## 2017-11-22 MED ORDER — SODIUM CHLORIDE 0.9 % IV SOLN
INTRAVENOUS | Status: AC
  Filled 2017-11-22: qty 1000

## 2017-11-22 MED ORDER — LEVOTHYROXINE SODIUM 88 MCG PO TABS
88.0000 ug | ORAL_TABLET | Freq: Every day | ORAL | Status: DC
  Administered 2017-11-23: 88 ug via ORAL
  Filled 2017-11-22: qty 1

## 2017-11-22 MED ORDER — ONDANSETRON HCL 4 MG/2ML IJ SOLN: 4.0000 mg | Freq: Four times a day (QID) | INTRAMUSCULAR | Status: DC | PRN

## 2017-11-22 MED ORDER — IOHEXOL 300 MG/ML  SOLN
30.0000 mL | Freq: Once | INTRAMUSCULAR | Status: AC | PRN
  Administered 2017-11-22: 30 mL via INTRAVENOUS

## 2017-11-22 MED ORDER — FERROUS SULFATE 325 (65 FE) MG PO TABS
325.0000 mg | ORAL_TABLET | Freq: Every day | ORAL | Status: DC
  Administered 2017-11-22 – 2017-11-23 (×2): 325 mg via ORAL
  Filled 2017-11-22 (×2): qty 1

## 2017-11-22 MED ORDER — ALLOPURINOL 300 MG PO TABS
300.0000 mg | ORAL_TABLET | Freq: Every morning | ORAL | Status: DC
  Administered 2017-11-23: 300 mg via ORAL
  Filled 2017-11-22 (×2): qty 1

## 2017-11-22 MED ORDER — PHENYLEPHRINE 40 MCG/ML (10ML) SYRINGE FOR IV PUSH (FOR BLOOD PRESSURE SUPPORT)
PREFILLED_SYRINGE | INTRAVENOUS | Status: DC | PRN
  Administered 2017-11-22 (×3): 80 ug via INTRAVENOUS

## 2017-11-22 MED ORDER — SODIUM CHLORIDE (PF) 0.9 % IJ SOLN
INTRAMUSCULAR | Status: AC
  Filled 2017-11-22: qty 150

## 2017-11-22 MED ORDER — HYDROCORTISONE NA SUCCINATE PF 100 MG IJ SOLR
INTRAMUSCULAR | Status: AC
  Filled 2017-11-22: qty 2

## 2017-11-22 MED ORDER — LACTATED RINGERS IV SOLN
INTRAVENOUS | Status: DC
  Administered 2017-11-22 (×2): via INTRAVENOUS

## 2017-11-22 MED ORDER — IOPAMIDOL (ISOVUE-370) INJECTION 76%
INTRAVENOUS | Status: AC
  Filled 2017-11-22: qty 100

## 2017-11-22 MED ORDER — LOPERAMIDE HCL 2 MG PO CAPS: 2.0000 mg | ORAL_CAPSULE | ORAL | Status: DC | PRN

## 2017-11-22 MED ORDER — METOPROLOL SUCCINATE ER 50 MG PO TB24
50.0000 mg | ORAL_TABLET | Freq: Every morning | ORAL | Status: DC
  Administered 2017-11-23: 50 mg via ORAL
  Filled 2017-11-22: qty 1

## 2017-11-22 MED ORDER — SODIUM CHLORIDE 0.9 % IV SOLN
INTRAVENOUS | Status: DC | PRN
  Administered 2017-11-22: 50 ug/min via INTRAVENOUS

## 2017-11-22 MED ORDER — POTASSIUM CHLORIDE ER 10 MEQ PO TBCR
20.0000 meq | EXTENDED_RELEASE_TABLET | Freq: Every day | ORAL | Status: DC
  Administered 2017-11-22 – 2017-11-23 (×2): 20 meq via ORAL
  Filled 2017-11-22 (×4): qty 2

## 2017-11-22 MED ORDER — SUGAMMADEX SODIUM 500 MG/5ML IV SOLN
INTRAVENOUS | Status: DC | PRN
  Administered 2017-11-22: 400 mg via INTRAVENOUS

## 2017-11-22 MED ORDER — ROCURONIUM BROMIDE 10 MG/ML (PF) SYRINGE
PREFILLED_SYRINGE | INTRAVENOUS | Status: DC | PRN
  Administered 2017-11-22: 10 mg via INTRAVENOUS
  Administered 2017-11-22: 60 mg via INTRAVENOUS
  Administered 2017-11-22: 20 mg via INTRAVENOUS
  Administered 2017-11-22: 10 mg via INTRAVENOUS

## 2017-11-22 MED ORDER — HYDROMORPHONE 1 MG/ML IV SOLN
INTRAVENOUS | Status: DC
  Administered 2017-11-22: 0 mg via INTRAVENOUS
  Administered 2017-11-22: 0.6 mg via INTRAVENOUS
  Administered 2017-11-22: 0 mg via INTRAVENOUS
  Administered 2017-11-22: 30 mg via INTRAVENOUS
  Filled 2017-11-22: qty 30

## 2017-11-22 MED ORDER — HYDROCORTISONE 5 MG PO TABS
15.0000 mg | ORAL_TABLET | Freq: Every day | ORAL | Status: DC
  Administered 2017-11-23: 15 mg via ORAL
  Filled 2017-11-22: qty 3

## 2017-11-22 MED ORDER — HYDROCORTISONE 5 MG PO TABS
5.0000 mg | ORAL_TABLET | ORAL | Status: DC
  Administered 2017-11-22: 5 mg via ORAL
  Filled 2017-11-22 (×3): qty 1

## 2017-11-22 MED ORDER — LIDOCAINE 2% (20 MG/ML) 5 ML SYRINGE
INTRAMUSCULAR | Status: DC | PRN
  Administered 2017-11-22: 100 mg via INTRAVENOUS

## 2017-11-22 MED ORDER — PROPOFOL 10 MG/ML IV BOLUS
INTRAVENOUS | Status: DC | PRN
  Administered 2017-11-22: 170 mg via INTRAVENOUS

## 2017-11-22 MED ORDER — TESTOSTERONE 4 MG/24HR TD PT24: 2.0000 | MEDICATED_PATCH | Freq: Every day | TRANSDERMAL | Status: DC

## 2017-11-22 MED ORDER — POLYVINYL ALCOHOL 1.4 % OP SOLN
1.0000 [drp] | Freq: Every day | OPHTHALMIC | Status: DC | PRN
  Filled 2017-11-22: qty 15

## 2017-11-22 MED ORDER — FUROSEMIDE 20 MG PO TABS
20.0000 mg | ORAL_TABLET | Freq: Every day | ORAL | Status: DC
  Administered 2017-11-22 – 2017-11-23 (×2): 20 mg via ORAL
  Filled 2017-11-22 (×2): qty 1

## 2017-11-22 MED ORDER — PIPERACILLIN-TAZOBACTAM 3.375 G IVPB 30 MIN
3.3750 g | Freq: Once | INTRAVENOUS | Status: AC
  Administered 2017-11-22: 3.375 g via INTRAVENOUS
  Filled 2017-11-22: qty 50

## 2017-11-22 MED ORDER — FENTANYL CITRATE (PF) 100 MCG/2ML IJ SOLN
INTRAMUSCULAR | Status: DC | PRN
  Administered 2017-11-22: 50 ug via INTRAVENOUS
  Administered 2017-11-22: 100 ug via INTRAVENOUS
  Administered 2017-11-22: 50 ug via INTRAVENOUS

## 2017-11-22 MED ORDER — FENTANYL CITRATE (PF) 100 MCG/2ML IJ SOLN
INTRAMUSCULAR | Status: AC
  Filled 2017-11-22: qty 6

## 2017-11-22 NOTE — Transfer of Care (Signed)
Immediate Anesthesia Transfer of Care Note  Patient: Zachary Willis  Procedure(s) Performed: CT MICROWAVE ABLATION LIVER (N/A )  Patient Location: PACU  Anesthesia Type:General  Level of Consciousness: awake, alert  and oriented  Airway & Oxygen Therapy: Patient Spontanous Breathing and Patient connected to face mask oxygen  Post-op Assessment: Report given to RN and Post -op Vital signs reviewed and stable  Post vital signs: Reviewed and stable  Last Vitals:  Vitals Value Taken Time  BP 160/101 11/22/2017 12:00 PM  Temp    Pulse 83 11/22/2017 12:02 PM  Resp 14 11/22/2017 12:02 PM  SpO2 100 % 11/22/2017 12:02 PM  Vitals shown include unvalidated device data.  Last Pain:  Vitals:   11/22/17 0800  PainSc: 0-No pain         Complications: No apparent anesthesia complications

## 2017-11-22 NOTE — Procedures (Signed)
Pre procedural Dx: Metastatic colon cancer  Post procedural Dx: Same  Technically successful image guided microwave ablation of both hepatic lesions, one within the right lobe of the liver and one within the caudate lobe.   EBL: Trace  Complications: None immediate.   Ronny Bacon, MD Pager #: [phone]

## 2017-11-22 NOTE — Anesthesia Postprocedure Evaluation (Signed)
Anesthesia Post Note  Patient: Zachary Willis  Procedure(s) Performed: CT MICROWAVE ABLATION LIVER (N/A )     Patient location during evaluation: PACU Anesthesia Type: General Level of consciousness: sedated and patient cooperative Pain management: pain level controlled Vital Signs Assessment: post-procedure vital signs reviewed and stable Respiratory status: spontaneous breathing Cardiovascular status: stable Anesthetic complications: no    Last Vitals:  Vitals:   11/22/17 1318 11/22/17 1348  BP:  (!) 163/97  Pulse:  78  Resp: 12 16  Temp:  36.7 C  SpO2: 97% 99%    Last Pain:  Vitals:   11/22/17 1348  TempSrc: Oral  PainSc:                  Nolon Nations

## 2017-11-22 NOTE — Anesthesia Procedure Notes (Signed)
Procedure Name: Intubation Date/Time: 11/22/2017 8:51 AM Performed by: [place] Okema Rollinson C, CRNA Pre-anesthesia Checklist: Patient identified, Emergency Drugs available, Suction available and Patient being monitored Patient Re-evaluated:Patient Re-evaluated prior to induction Oxygen Delivery Method: Circle system utilized Preoxygenation: Pre-oxygenation with 100% oxygen Induction Type: IV induction Ventilation: Mask ventilation without difficulty Laryngoscope Size: Mac and 4 Grade View: Grade I Tube type: Oral Tube size: 7.5 mm Number of attempts: 1 Airway Equipment and Method: Stylet and Oral airway Placement Confirmation: ETT inserted through vocal cords under direct vision,  positive ETCO2 and breath sounds checked- equal and bilateral Tube secured with: Tape Dental Injury: Teeth and Oropharynx as per pre-operative assessment

## 2017-11-22 NOTE — Progress Notes (Signed)
Patient ID: Zachary Willis, male   DOB: Jun 23, 1945, 72 y.o.   MRN: [id number] Patient doing fairly well.  Has had some mild right upper quadrant discomfort.  Denies nausea, vomiting or respiratory difficulties. Vital signs stable, afebrile Puncture sites right upper quadrant abdomen clean, dry, minimal tenderness; yellow urine in Foley cath  A/P: Metastatic colon cancer; status post CT-guided microwave ablation of right hepatic lobe and caudate lobe lesions earlier today; PCA Dilaudid for pain; for overnight observation; DC Foley later today; follow-up with Dr. Pascal Lux in [place] clinic in 3 to 4 weeks   Rowe Robert, [facility]

## 2017-11-22 NOTE — H&P (Signed)
Referring Physician(s): Ennever,P  Supervising Physician: Sandi Mariscal  Patient Status:  [facility] OP TBA  Chief Complaint:  Metastatic colon cancer  Subjective: Patient familiar to IR service from prior consultation with Dr. Pascal Lux on 10/10/2017 to discuss treatment options for metastatic colon cancer to the liver.  He has also undergone previous right hepatic lobe lesion biopsy on 10/27/2017/follow-up visit on 11/07/2017.  He has a history of metastatic colon cancer to the liver with prior hemicolectomy and partial hepatectomy/chemotherapy.  Recent imaging has revealed 2 liver metastases in segment 5 and caudate lobe .  Following discussions with Dr. Pascal Lux he was deemed an appropriate candidate for CT-guided microwave ablation of the liver lesions and presents today for the procedure.  He currently denies fever, headache, chest pain, dyspnea, cough, abdominal/back pain, nausea, vomiting or bleeding.   Past Medical History:  Diagnosis Date  . Anticoagulated on Coumadin    chronic long term  . Antineoplastic chemotherapy induced anemia   . Chronic atrial fibrillation    cardiologist--  dr Curly [facility] in [place])--- hx DVVC in 2006/  TEE 10-21-2015 (care everywhere)  ef 55-60%, moderate dilated RA, mild MR, RVSP 55mmHg  . CKD (chronic kidney disease), stage II   . Colon cancer metastasized to liver [facility])    current oncologist-- dr Marin Olp (previous oncologist at [facility] oncology)--- dx 12/ 2017,  Stage IIb ([id number]),  s/p  resection colon tumor 01-01-2016 ,  recurrent w/ liver mets via bx 08/ 2018  , had chemo then partial hepatectomy and completed chemo after surgery/ liver mets recurrence 08/ 2019  . Cushing's disease [facility])    endocriniologist-- dr Elisabeth Most [facility])  . Droopy eyelid, right    11-15-2017  . GERD (gastroesophageal reflux disease)   . Goals of care, counseling/discussion 08/30/2017  . Gout    11-15-2017 per pt last flare-up 2018  . History of benign pituitary  tumor    resection 07-25-2013  . History of cancer chemotherapy    COMPLETED 02-20-2017  FOR  COLON CANCER WITH LIVER METS  . History of radiation therapy    03/ 2016  remainder of pituitary tumor  . Hypertensive cardiovascular disease   . Hypothyroidism, secondary   . OA (osteoarthritis)   . Secondary adrenal insufficiency ([place])   . Secondary male hypogonadism   . Wears glasses   . Wears partial dentures    upper and lower   Past Surgical History:  Procedure Laterality Date  . COLOSTOMY TAKEDOWN  05-16-2016   @[facility]   . IR RADIOLOGIST EVAL & MGMT  10/10/2017  . IR RADIOLOGIST EVAL & MGMT  11/07/2017  . OPEN PARTIAL HEPATECTOMY   01-13-2017    @[facility]    AND CHOLECYSTECTOMY  . REVISION TOTAL KNEE ARTHROPLASTY Left 12/20/2013  . SUBTOTAL COLECTOMY  01-01-2016  @[facility]    W/  CREATION ILEOSTOMY AND UMBILICAL HERNIA REPAIR  . TOTAL KNEE ARTHROPLASTY Bilateral left 11-16-2010;  right 10-26-2011  . TRANSPHENOIDAL PITUITARY RESECTION  07-25-2013   @[facility]      Allergies: Patient has no known allergies.  Medications: Prior to Admission medications   Medication Sig Start Date End Date Taking? Authorizing Provider  metoprolol succinate (TOPROL-XL) 50 MG 24 hr tablet Take 50 mg by mouth every morning.  04/19/16  Yes [provider]  allopurinol (ZYLOPRIM) 300 MG tablet Take 300 mg by mouth every morning.  01/28/10   [provider]  doxazosin (CARDURA) 4 MG tablet Take 4 mg by mouth at bedtime.  08/13/17  [provider]  ferrous sulfate 325 (65 FE) MG tablet Take 325 mg by mouth daily.    [provider]  furosemide (LASIX) 20 MG tablet Take 20 mg by mouth daily.     [provider]  hydrocortisone (CORTEF) 10 MG tablet Take 5-15 mg by mouth See admin instructions. Takes 15 mg in am and 5mg  in afternoon. 08/19/17   [provider]  levothyroxine (SYNTHROID, LEVOTHROID) 88 MCG tablet Take 88 mcg by mouth daily before breakfast.  05/15/17    [provider]  loperamide (IMODIUM) 2 MG capsule Take 2 mg by mouth as needed for diarrhea or loose stools.     [provider]  NASAL [facility] NA Place 1 spray into the nose daily.     [provider]  pantoprazole (PROTONIX) 40 MG tablet Take 40 mg by mouth every morning.  05/16/14   [provider]  polyvinyl alcohol (LIQUIFILM TEARS) 1.4 % ophthalmic solution Place 1 drop into both eyes daily as needed for dry eyes.     [provider]  potassium chloride (KLOR-CON 10) 10 MEQ tablet Take 20 mEq by mouth daily. 11/17/14   [provider]  prochlorperazine (COMPAZINE) 10 MG tablet Take 10 mg by mouth every 8 (eight) hours as needed for nausea or vomiting.  09/01/16   [provider]  testosterone (ANDRODERM) 4 MG/24HR PT24 patch Place 2 patches onto the skin daily.  04/17/15   [provider]  warfarin (COUMADIN) 5 MG tablet Take 5 mg by mouth daily. 07/05/17   [provider]     Vital Signs: Blood pressure 142/83, heart rate 69, respirations 18, temperature 98.7, O2 sat 97% room air Ht 5\' 10"  (1.778 m)   Wt 258 lb 6 oz (117.2 kg)   BMI 37.07 kg/m   Physical Exam awake, alert.  Chest with some slightly diminished breath sounds at bases.  Right chest wall Port-A-Cath in place.  Heart with irregularly irregular rhythm.  Abdomen obese, soft, positive bowel sounds, nontender.  Some trace pretibial edema bilaterally.  Imaging: No results found.  Labs:  CBC: Recent Labs    08/30/17 1118 10/27/17 0651 11/15/17 0947 11/22/17 0746  WBC 4.9 5.7 5.9 6.8  HGB 12.3* 13.0 13.5 13.1  HCT 38.0* 40.7 42.4 41.4  PLT 117* 133* 141* 128*    COAGS: Recent Labs    10/27/17 0651 11/22/17 0746  INR 1.51 1.07    BMP: Recent Labs    08/30/17 1118 10/17/17 0830 11/15/17 0947  NA 142  --  142  K 3.9  --  4.4  CL 105  --  105  CO2 29  --  29  GLUCOSE 177*  --  125*  BUN 13  --  22  CALCIUM 9.2  --  9.2    CREATININE 1.43* 1.40* 1.48*  GFRNONAA 48*  --  46*  GFRAA 56*  --  54*    LIVER FUNCTION TESTS: Recent Labs    08/30/17 1118 11/15/17 0947  BILITOT 0.6 1.2  AST 17 22  ALT 6 12  ALKPHOS 98 88  PROT 6.5 7.1  ALBUMIN 3.5 4.0    Assessment and Plan: Pt with history of metastatic colon cancer to the liver with prior hemicolectomy and partial hepatectomy/chemotherapy.  Recent imaging has revealed 2 liver metastases in segment 5 and caudate lobe .  Following discussions with Dr. Pascal Lux he was deemed an appropriate candidate for CT-guided microwave ablation of the liver lesions and  presents today for the procedure.  Details/risks of procedure, including but not limited to, internal bleeding, infection, injury to adjacent structures, anesthesia related complications discussed with patient and family with their understanding and consent.  Postprocedure he will be admitted for overnight observation.   Electronically Signed: D. Rowe Robert, PA-C 11/22/2017, 8:19 AM   I spent a total of 30 minutes at the the patient's bedside AND on the patient's hospital floor or unit, greater than 50% of which was counseling/coordinating care for CT guided microwave ablation of liver tumors

## 2017-11-23 ENCOUNTER — Encounter (HOSPITAL_COMMUNITY): Payer: Self-pay | Admitting: Interventional Radiology

## 2017-11-23 DIAGNOSIS — C189 Malignant neoplasm of colon, unspecified: Secondary | ICD-10-CM | POA: Diagnosis not present

## 2017-11-23 NOTE — Discharge Summary (Addendum)
Patient ID: Zachary Willis MRN: [id number] DOB/AGE: 70-25-1947 70 y.o.  Admit date: 11/22/2017 Discharge date: 11/23/2017  Supervising Physician: Aletta Edouard  Patient Status: [facility] - In-pt  Admission Diagnoses: Colon cancer metastasized to liver  Discharge Diagnoses:  Active Problems:   Colon cancer metastasized to liver [facility])   Discharged Condition: stable  Hospital Course:  Patient presented to [facility] 11/22/2017 for an image-guided microwave ablation of two hepatic lesions (one within the right lobe of the liver and one within the caudate lobe of the liver) by Dr. Pascal Lux. Procedure occurred without major complications and patient was transferred to floor for overnight observation. No major events occurred overnight.  Patient awake and alert walking the halls of the hospital unit. Accompanied by wife. Denies RUQ abdominal pain at this time. Prescription for Percocet 5/325 mg tablets given (dispense 20 tablets with 0 refills), take 1-2 tablets every 6 hours as needed for moderate-severe abdominal pain. Plan to discharge home today and follow-up with Dr. Pascal Lux in clinic in 3-4 weeks.   Consults: None  Significant Diagnostic Studies: Dg Chest 1 View  Result Date: 11/22/2017 CLINICAL DATA:  72 year old male under preoperative evaluation prior to ablation procedure for atrial fibrillation. EXAM: CHEST  1 VIEW COMPARISON:  No priors. FINDINGS: Lung volumes are normal. No consolidative airspace disease. No pleural effusions. No pneumothorax. No pulmonary nodule or mass noted. Pulmonary vasculature and the cardiomediastinal silhouette are within normal limits. Atherosclerosis in the thoracic aorta. Right internal jugular single-lumen porta cath with tip terminating in the mid superior vena cava. IMPRESSION: 1. No radiographic evidence of acute cardiopulmonary disease. 2. Aortic atherosclerosis. Electronically Signed   By: Vinnie Langton M.D.   On: 11/22/2017 09:08   US Biopsy  (liver)  Result Date: 10/27/2017 INDICATION: History of metastatic colon cancer, now with indeterminate liver lesions worrisome for progression metastatic disease. Please perform ultrasound-guided liver lesion biopsy for tissue diagnostic purposes. EXAM: ULTRASOUND GUIDED LIVER LESION BIOPSY COMPARISON:  Abdominal MRI-10/17/2017; PET-CT-09/29/2017 MEDICATIONS: None ANESTHESIA/SEDATION: Fentanyl 75 mcg IV; Versed 1.5 mg IV Total Moderate Sedation time:  12 Minutes. The patient's level of consciousness and vital signs were monitored continuously by radiology nursing throughout the procedure under my direct supervision. COMPLICATIONS: None immediate. PROCEDURE: Informed written consent was obtained from the patient after a discussion of the risks, benefits and alternatives to treatment. The patient understands and consents the procedure. A timeout was performed prior to the initiation of the procedure. Ultrasound scanning was performed of the right upper abdominal quadrant demonstrates an approximately 2.7 x 1.6 cm hypo colic lesion within subcapsular aspect of the right lobe of the liver correlating with the hypermetabolic lesions seen on preceding PET-CT image 119, series 4). Exhaustive sonographic evaluation was performed however the additional lesion within the anterior aspect of the caudate was not definitively identified sonographically. The dominant lesion within subcapsular aspect was targeted for biopsy given lesion location and sonographic window. The procedure was planned. The right upper abdominal quadrant was prepped and draped in the usual sterile fashion. The overlying soft tissues were anesthetized with 1% lidocaine with epinephrine. A 17 gauge, 6.8 cm co-axial needle was advanced into a peripheral aspect of the lesion. This was followed by 4 core biopsies with an 18 gauge core device under direct ultrasound guidance. The coaxial needle tract was embolized with a small amount of Gel-Foam slurry and  superficial hemostasis was obtained with manual compression. Post procedural scanning was negative for definitive area of hemorrhage or additional complication. A dressing was placed.  The patient tolerated the procedure well without immediate post procedural complication. IMPRESSION: 1. Technically successful ultrasound guided core needle biopsy of indeterminate lesion within the subcapsular aspect the right lobe of the liver. 2. Despite exhaustive sonographic evaluation, the additional lesion with the anterior aspect of caudate was not definitively identified sonographically. Electronically Signed   By: Sandi Mariscal M.D.   On: 10/27/2017 10:06   Ct Guide Tissue Ablation  Result Date: 11/22/2017 INDICATION: History metastatic colon cancer. Patient presents today for image guided percutaneous microwave ablation of 2 known hepatic lesions. Please refer to formal consultation in the epic EMR dated 11/07/2017 for additional details. EXAM: CT-GUIDED PERCUTANEOUS THERMAL ABLATION OF LIVER LESION x2 ANESTHESIA/SEDATION: General MEDICATIONS: Zosyn 3.375 g IV. The antibiotic was administered in an appropriate time interval prior to needle puncture of the skin. CONTRAST:  58mL [id number] PROCEDURE: The procedure, risks, benefits, and alternatives were explained to the patient. Questions regarding the procedure were encouraged and answered. The patient understands and consents to the procedure. The patient was placed under general anesthesia. Initial unenhanced CT was performed in a supine, slightly LPO position to localize both ill-defined hepatic lesions with central lesion within the caudate lobe measuring approximately 2.3 x 1.9 cm (image 40, series 2) and additional ill-defined lesion within the subcapsular aspect of the right lobe of the liver measuring at least 2.5 x 1.7 cm (image 54, series 2). The procedure was planned. The patient was prepped with Betadine in a sterile fashion, and a sterile drape was applied  covering the operative field. A sterile gown and sterile gloves were used for the procedure. A 22 gauge spinal needle was advanced in expected trajectory of the microwave ablation probes for procedural planning. Next, intravenous contrast was administered for the purposes of lesion confirmation. Once the appropriate trajectory was established, a 20 cm 15 gauge Neuwave microwave ablation probe was advanced into the ill-defined hypoattenuating lesion within the caudate. At the same time, a 15 cm, 15 gauge Neuwave wave microwave ablation probe was utilized to target the additional lesion within the subcapsular aspect the right lobe of the liver. Appropriate positioning was confirmed with intermittent CT guidance. Additionally, ultrasound was utilized to target the lesion within subcapsular aspect the right lobe of the liver. Once appropriate probe positioning was confirmed both probes were locked in place followed by a 7 minute ablation at 65 watts. Intra procedural imaging was performed at approximately 4 minutes. Tract ablation was performed and both probes were removed intact. Postprocedural imaging was performed and the procedure was terminated. 1% lidocaine with epinephrine was utilized to local anesthetize at both access sites. Dressings were applied. The patient tolerated the procedure well without immediate postprocedural complication. COMPLICATIONS: None immediate. FINDINGS: Precontrast CT images demonstrate grossly unchanged appearance of both ill-defined hepatic lesions with central lesion within the caudate lobe measuring approximately 2.3 x 1.9 cm and additional ill-defined lesion within the subcapsular aspect of the right lobe of the liver measuring at least 2.5 x 1.7 cm. These findings were confirmed with the administration of intravenous contrast (series 7 and 8). Under intermittent CT guidance, a percutaneous microwave probe was advanced into the ill-defined lesion within the caudate. Under a  combination of CT and ultrasound guidance, a percutaneous microwave ablation probe was advanced into the ill-defined lesion within subcapsular aspect of the right lobe of the liver. Intra ablation images demonstrate an excellent result with both ablation zone encompassing the entirety of both ill-defined hypoattenuating hepatic lesions. Post ablation imaging confirms adequate ablation zones  and was negative for obvious complication, specifically, no pneumothorax or significant hemorrhage about the ablation site. IMPRESSION: Technically successful image guided percutaneous microwave ablation of both ill-defined lesions within the caudate and subcapsular aspect of the right lobe of the liver. PLAN: - The patient will be admitted for continued observation and PCA usage. - The patient be seen in interventional radiology clinic in 3-4 weeks. Postprocedural CMP will be obtained prior to this appointment. No imaging will be necessary for this initial postprocedural evaluation. - Initial surveillance imaging (either with abdominal MRI or PET-CT, as per coordination with Dr. Marin Olp) will be performed in 3 months (late February). Electronically Signed   By: Sandi Mariscal M.D.   On: 11/22/2017 13:01   Ir Radiologist Eval & Mgmt  Result Date: 11/07/2017 Please refer to notes tab for details about interventional procedure. (Op Note)   Treatments: Image-guided microwave ablation of two hepatic lesions (one within the right lobe of the liver and one within the caudate lobe of the liver).  Discharge Exam: Blood pressure 134/74, pulse 63, temperature 98.3 F (36.8 C), temperature source Oral, resp. rate 18, height 5\' 10"  (1.778 m), weight 258 lb 6 oz (117.2 kg), SpO2 97 %. Physical Exam  Constitutional: He is oriented to person, place, and time. He appears well-developed and well-nourished. No distress.  Pulmonary/Chest: Effort normal. No respiratory distress.  Abdominal:  RUQ incision sites without erythema,  drainage, or tenderness.  Neurological: He is alert and oriented to person, place, and time.  Skin: Skin is warm and dry.  Psychiatric: He has a normal mood and affect. His behavior is normal. Judgment and thought content normal.  Nursing note and vitals reviewed.   Disposition: Discharge disposition: 01-Home or Self Care       Discharge Instructions    Call MD for:  difficulty breathing, headache or visual disturbances   Complete by:  As directed    Call MD for:  extreme fatigue   Complete by:  As directed    Call MD for:  hives   Complete by:  As directed    Call MD for:  persistant dizziness or light-headedness   Complete by:  As directed    Call MD for:  persistant nausea and vomiting   Complete by:  As directed    Call MD for:  redness, tenderness, or signs of infection (pain, swelling, redness, odor or green/yellow discharge around incision site)   Complete by:  As directed    Call MD for:  severe uncontrolled pain   Complete by:  As directed    Call MD for:  temperature >100.4   Complete by:  As directed    Diet - low sodium heart healthy   Complete by:  As directed    Increase activity slowly   Complete by:  As directed      Allergies as of 11/23/2017   No Known Allergies     Medication List    TAKE these medications   allopurinol 300 MG tablet Commonly known as:  ZYLOPRIM Take 300 mg by mouth every morning.   doxazosin 4 MG tablet Commonly known as:  CARDURA Take 4 mg by mouth at bedtime.   ferrous sulfate 325 (65 FE) MG tablet Take 325 mg by mouth daily.   furosemide 20 MG tablet Commonly known as:  LASIX Take 20 mg by mouth daily.   hydrocortisone 10 MG tablet Commonly known as:  CORTEF Take 5-15 mg by mouth See admin instructions. Takes 15 mg  in am and 5mg  in afternoon.   KLOR-CON 10 10 MEQ tablet Generic drug:  potassium chloride Take 20 mEq by mouth daily.   levothyroxine 88 MCG tablet Commonly known as:  SYNTHROID, LEVOTHROID Take 88  mcg by mouth daily before breakfast.   loperamide 2 MG capsule Commonly known as:  IMODIUM Take 2 mg by mouth as needed for diarrhea or loose stools.   metoprolol succinate 50 MG 24 hr tablet Commonly known as:  TOPROL-XL Take 50 mg by mouth every morning.   NASAL [place] NA Place 1 spray into the nose daily.   pantoprazole 40 MG tablet Commonly known as:  PROTONIX Take 40 mg by mouth every morning.   polyvinyl alcohol 1.4 % ophthalmic solution Commonly known as:  LIQUIFILM TEARS Place 1 drop into both eyes daily as needed for dry eyes.   prochlorperazine 10 MG tablet Commonly known as:  COMPAZINE Take 10 mg by mouth every 8 (eight) hours as needed for nausea or vomiting.   testosterone 4 MG/24HR Pt24 patch Commonly known as:  ANDRODERM Place 2 patches onto the skin daily.   warfarin 5 MG tablet Commonly known as:  COUMADIN Take 5 mg by mouth daily.         Electronically Signed: Earley Abide, PA-C 11/23/2017, 10:58 AM   I have spent Less Than 30 Minutes discharging Zachary Willis.

## 2017-11-23 NOTE — Progress Notes (Signed)
I have reviewed and concur with this student's documentation.   

## 2017-11-23 NOTE — Progress Notes (Signed)
Patient discharge home with wife. Discharge explained to patient and wife. No questions. NT wheeled patient out to main entrance.

## 2017-11-23 NOTE — Progress Notes (Deleted)
I have reviewed and concur with this student's documentation.   

## 2017-11-28 ENCOUNTER — Other Ambulatory Visit: Payer: Self-pay | Admitting: Radiology

## 2017-11-28 DIAGNOSIS — C787 Secondary malignant neoplasm of liver and intrahepatic bile duct: Secondary | ICD-10-CM

## 2017-12-21 ENCOUNTER — Ambulatory Visit
Admission: RE | Admit: 2017-12-21 | Discharge: 2017-12-21 | Disposition: A | Discharge status: Home / Self Care | Payer: Medicare HMO | Source: Ambulatory Visit | Attending: Student | Admitting: Student

## 2017-12-21 ENCOUNTER — Other Ambulatory Visit: Payer: Self-pay | Admitting: Hematology & Oncology

## 2017-12-21 ENCOUNTER — Telehealth: Payer: Self-pay | Admitting: Hematology & Oncology

## 2017-12-21 ENCOUNTER — Encounter: Payer: Self-pay | Admitting: Radiology

## 2017-12-21 DIAGNOSIS — C189 Malignant neoplasm of colon, unspecified: Secondary | ICD-10-CM

## 2017-12-21 DIAGNOSIS — C787 Secondary malignant neoplasm of liver and intrahepatic bile duct: Principal | ICD-10-CM

## 2017-12-21 HISTORY — PX: IR RADIOLOGIST EVAL & MGMT: IMG5224

## 2017-12-21 NOTE — Telephone Encounter (Signed)
lmom for pt to return call to office to confirm lab/ov appt 12/24 at 12 pm per 12/19 sch msg

## 2017-12-21 NOTE — Progress Notes (Signed)
Patient ID: Zachary Willis, male   DOB: 05/10/1945, 72 y.o.   MRN: [id number]         Chief Complaint: Metastatic colon cancer, post microwave hepatic ablation  Referring Physician(s): Ennever  History of Present Illness: Zachary Willis is a 72 y.o. male with past medical history significant for atrial fibrillation (chronic Coumadin), pituitary tumor (post resection) with chronic steroid supplementation, hypertension and hypothyroidism who initially presented to the interventional radiology clinic on 10/10/2017 for evaluation of liver directed therapy for metastatic colon cancer.  Patient subsequently underwent ultrasound-guided liver lesion biopsy on 10/27/2017 of the more peripheral lesion with the right lobe of the liver confirming metastatic colon cancer.  The patient was then seen in repeat consultation on 11/07/2017 and the decision was made to pursue image guided microwave ablation of both liver lesions which was subsequently performed on 11/22/2017.  Patient recovered uneventfully from this procedure and was discharged the following day.  Patient returns to the interventional radiology clinic for postprocedural evaluation and management.  He is accompanied by his wife though serves his own historian.  Patient has recovered completely from the microwave ablation and is without complaint.  Specifically, no abdominal pain.  No fever or chills.  Patient states that his energy level is much improved and he has "not felt this great in quite some time."  He is in very good spirits and is overall very pleased with the procedure.   Past Medical History:  Diagnosis Date  . Anticoagulated on Coumadin    chronic long term  . Antineoplastic chemotherapy induced anemia   . Chronic atrial fibrillation    cardiologist--  dr Curly [facility] in [place])--- hx DVVC in 2006/  TEE 10-21-2015 (care everywhere)  ef 55-60%, moderate dilated RA, mild MR, RVSP 41mmHg  . CKD (chronic kidney disease), stage  II   . Colon cancer metastasized to liver [facility])    current oncologist-- dr Marin Olp (previous oncologist at [facility] oncology)--- dx 12/ 2017,  Stage IIb ([id number]),  s/p  resection colon tumor 01-01-2016 ,  recurrent w/ liver mets via bx 08/ 2018  , had chemo then partial hepatectomy and completed chemo after surgery/ liver mets recurrence 08/ 2019  . Cushing's disease [facility])    endocriniologist-- dr Elisabeth Most [facility])  . Droopy eyelid, right    11-15-2017  . GERD (gastroesophageal reflux disease)   . Goals of care, counseling/discussion 08/30/2017  . Gout    11-15-2017 per pt last flare-up 2018  . History of benign pituitary tumor    resection 07-25-2013  . History of cancer chemotherapy    COMPLETED 02-20-2017  FOR  COLON CANCER WITH LIVER METS  . History of radiation therapy    03/ 2016  remainder of pituitary tumor  . Hypertensive cardiovascular disease   . Hypothyroidism, secondary   . OA (osteoarthritis)   . Secondary adrenal insufficiency ([place])   . Secondary male hypogonadism   . Wears glasses   . Wears partial dentures    upper and lower    Past Surgical History:  Procedure Laterality Date  . COLOSTOMY TAKEDOWN  05-16-2016   @[facility]   . IR RADIOLOGIST EVAL & MGMT  10/10/2017  . IR RADIOLOGIST EVAL & MGMT  11/07/2017  . OPEN PARTIAL HEPATECTOMY   01-13-2017    @[facility]    AND CHOLECYSTECTOMY  . RADIOFREQUENCY ABLATION N/A 11/22/2017   Procedure: CT MICROWAVE ABLATION LIVER;  Surgeon: Sandi Mariscal, MD;  Location: [facility] ORS;  Service: Anesthesiology;  Laterality: N/A;  .  REVISION TOTAL KNEE ARTHROPLASTY Left 12/20/2013  . SUBTOTAL COLECTOMY  01-01-2016  @[facility]    W/  CREATION ILEOSTOMY AND UMBILICAL HERNIA REPAIR  . TOTAL KNEE ARTHROPLASTY Bilateral left 11-16-2010;  right 10-26-2011  . TRANSPHENOIDAL PITUITARY RESECTION  07-25-2013   @[facility]    Allergies: Patient has no known allergies.  Medications: Prior to Admission medications   Medication Sig Start Date End Date  Taking? Authorizing Provider  allopurinol (ZYLOPRIM) 300 MG tablet Take 300 mg by mouth every morning.  01/28/10  Yes [provider]  doxazosin (CARDURA) 4 MG tablet Take 4 mg by mouth at bedtime.  08/13/17  Yes [provider]  ferrous sulfate 325 (65 FE) MG tablet Take 325 mg by mouth daily.   Yes [provider]  furosemide (LASIX) 20 MG tablet Take 20 mg by mouth daily.    Yes [provider]  hydrocortisone (CORTEF) 10 MG tablet Take 5-15 mg by mouth See admin instructions. Takes 15 mg in am and 5mg  in afternoon. 08/19/17  Yes [provider]  levothyroxine (SYNTHROID, LEVOTHROID) 88 MCG tablet Take 88 mcg by mouth daily before breakfast.  05/15/17  Yes [provider]  loperamide (IMODIUM) 2 MG capsule Take 2 mg by mouth as needed for diarrhea or loose stools.    Yes [provider]  metoprolol succinate (TOPROL-XL) 50 MG 24 hr tablet Take 50 mg by mouth every morning.  04/19/16  Yes [provider]  NASAL [place] NA Place 1 spray into the nose daily.    Yes [provider]  pantoprazole (PROTONIX) 40 MG tablet Take 40 mg by mouth every morning.  05/16/14  Yes [provider]  polyvinyl alcohol (LIQUIFILM TEARS) 1.4 % ophthalmic solution Place 1 drop into both eyes daily as needed for dry eyes.    Yes [provider]  potassium chloride (KLOR-CON 10) 10 MEQ tablet Take 20 mEq by mouth daily. 11/17/14  Yes [provider]  prochlorperazine (COMPAZINE) 10 MG tablet Take 10 mg by mouth every 8 (eight) hours as needed for nausea or vomiting.  09/01/16  Yes [provider]  testosterone (ANDRODERM) 4 MG/24HR PT24 patch Place 2 patches onto the skin daily.  04/17/15  Yes [provider]  warfarin (COUMADIN) 5 MG tablet Take 5 mg by mouth daily. 07/05/17  Yes [provider]     No family history on file.  Social History   Socioeconomic History  . Marital status: Married     Spouse name: Not on file  . Number of children: Not on file  . Years of education: Not on file  . Highest education level: Not on file  Occupational History  . Not on file  Social Needs  . Financial resource strain: Not on file  . Food insecurity:    Worry: Not on file    Inability: Not on file  . Transportation needs:    Medical: Not on file    Non-medical: Not on file  Tobacco Use  . Smoking status: Never Smoker  . Smokeless tobacco: Never Used  Substance and Sexual Activity  . Alcohol use: Not Currently  . Drug use: Never  . Sexual activity: Not on file  Lifestyle  . Physical activity:    Days per week: Not on file    Minutes per session: Not on file  . Stress: Not on file  Relationships  . Social connections:    Talks on phone: Not on file    Gets together:  Not on file    Attends religious service: Not on file    Active member of club or organization: Not on file    Attends meetings of clubs or organizations: Not on file    Relationship status: Not on file  Other Topics Concern  . Not on file  Social History Narrative  . Not on file    ECOG Status: 0 - Asymptomatic  Review of Systems: A 12 point ROS discussed and pertinent positives are indicated in the HPI above.  All other systems are negative.  Review of Systems  Constitutional: Negative for activity change, appetite change, fatigue, fever and unexpected weight change.  Respiratory: Negative.   Cardiovascular: Negative.   Gastrointestinal: Negative for abdominal pain.    Vital Signs: BP 131/67   Pulse 61   Temp 98.2 F (36.8 C) (Oral)   Resp 17   Ht 5\' 10"  (1.778 m)   Wt 117.9 kg   SpO2 99%   BMI 37.31 kg/m   Physical Exam Vitals signs and nursing note reviewed.  Constitutional:      Appearance: Normal appearance.  HENT:     Head: Normocephalic and atraumatic.  Skin:    General: Skin is warm and dry.  Neurological:     Mental Status: He is alert.  Psychiatric:        Mood and Affect:  Mood normal.        Behavior: Behavior normal.        Thought Content: Thought content normal.     Imaging:  Selected images from PET/CT performed 09/29/2017; abdominal MRI performed 10/20/2017 and intraprocedural images during microwave hepatic ablation performed 11/22/2017 were reviewed in detail with the patient and the patient's wife.  Dg Chest 1 View  Result Date: 11/22/2017 CLINICAL DATA:  72 year old male under preoperative evaluation prior to ablation procedure for atrial fibrillation. EXAM: CHEST  1 VIEW COMPARISON:  No priors. FINDINGS: Lung volumes are normal. No consolidative airspace disease. No pleural effusions. No pneumothorax. No pulmonary nodule or mass noted. Pulmonary vasculature and the cardiomediastinal silhouette are within normal limits. Atherosclerosis in the thoracic aorta. Right internal jugular single-lumen porta cath with tip terminating in the mid superior vena cava. IMPRESSION: 1. No radiographic evidence of acute cardiopulmonary disease. 2. Aortic atherosclerosis. Electronically Signed   By: Vinnie Langton M.D.   On: 11/22/2017 09:08   Ct Guide Tissue Ablation  Result Date: 11/22/2017 INDICATION: History metastatic colon cancer. Patient presents today for image guided percutaneous microwave ablation of 2 known hepatic lesions. Please refer to formal consultation in the epic EMR dated 11/07/2017 for additional details. EXAM: CT-GUIDED PERCUTANEOUS THERMAL ABLATION OF LIVER LESION x2 ANESTHESIA/SEDATION: General MEDICATIONS: Zosyn 3.375 g IV. The antibiotic was administered in an appropriate time interval prior to needle puncture of the skin. CONTRAST:  83mL [id number] PROCEDURE: The procedure, risks, benefits, and alternatives were explained to the patient. Questions regarding the procedure were encouraged and answered. The patient understands and consents to the procedure. The patient was placed under general anesthesia. Initial unenhanced CT was performed in a  supine, slightly LPO position to localize both ill-defined hepatic lesions with central lesion within the caudate lobe measuring approximately 2.3 x 1.9 cm (image 40, series 2) and additional ill-defined lesion within the subcapsular aspect of the right lobe of the liver measuring at least 2.5 x 1.7 cm (image 54, series 2). The procedure was planned. The patient was prepped with Betadine in a sterile fashion, and a sterile drape was applied covering  the operative field. A sterile gown and sterile gloves were used for the procedure. A 22 gauge spinal needle was advanced in expected trajectory of the microwave ablation probes for procedural planning. Next, intravenous contrast was administered for the purposes of lesion confirmation. Once the appropriate trajectory was established, a 20 cm 15 gauge Neuwave microwave ablation probe was advanced into the ill-defined hypoattenuating lesion within the caudate. At the same time, a 15 cm, 15 gauge Neuwave wave microwave ablation probe was utilized to target the additional lesion within the subcapsular aspect the right lobe of the liver. Appropriate positioning was confirmed with intermittent CT guidance. Additionally, ultrasound was utilized to target the lesion within subcapsular aspect the right lobe of the liver. Once appropriate probe positioning was confirmed both probes were locked in place followed by a 7 minute ablation at [address]. Intra procedural imaging was performed at approximately 4 minutes. Tract ablation was performed and both probes were removed intact. Postprocedural imaging was performed and the procedure was terminated. 1% lidocaine with epinephrine was utilized to local anesthetize at both access sites. Dressings were applied. The patient tolerated the procedure well without immediate postprocedural complication. COMPLICATIONS: None immediate. FINDINGS: Precontrast CT images demonstrate grossly unchanged appearance of both ill-defined hepatic lesions  with central lesion within the caudate lobe measuring approximately 2.3 x 1.9 cm and additional ill-defined lesion within the subcapsular aspect of the right lobe of the liver measuring at least 2.5 x 1.7 cm. These findings were confirmed with the administration of intravenous contrast (series 7 and 8). Under intermittent CT guidance, a percutaneous microwave probe was advanced into the ill-defined lesion within the caudate. Under a combination of CT and ultrasound guidance, a percutaneous microwave ablation probe was advanced into the ill-defined lesion within subcapsular aspect of the right lobe of the liver. Intra ablation images demonstrate an excellent result with both ablation zone encompassing the entirety of both ill-defined hypoattenuating hepatic lesions. Post ablation imaging confirms adequate ablation zones and was negative for obvious complication, specifically, no pneumothorax or significant hemorrhage about the ablation site. IMPRESSION: Technically successful image guided percutaneous microwave ablation of both ill-defined lesions within the caudate and subcapsular aspect of the right lobe of the liver. PLAN: - The patient will be admitted for continued observation and PCA usage. - The patient be seen in interventional radiology clinic in 3-4 weeks. Postprocedural CMP will be obtained prior to this appointment. No imaging will be necessary for this initial postprocedural evaluation. - Initial surveillance imaging (either with abdominal MRI or PET-CT, as per coordination with Dr. Marin Olp) will be performed in 3 months (late February). Electronically Signed   By: Sandi Mariscal M.D.   On: 11/22/2017 13:01    Labs:  CBC: Recent Labs    08/30/17 1118 10/27/17 0651 11/15/17 0947 11/22/17 0746  WBC 4.9 5.7 5.9 6.8  HGB 12.3* 13.0 13.5 13.1  HCT 38.0* 40.7 42.4 41.4  PLT 117* 133* 141* 128*    COAGS: Recent Labs    10/27/17 0651 11/22/17 0746  INR 1.51 1.07    BMP: Recent Labs     08/30/17 1118 10/17/17 0830 11/15/17 0947 11/22/17 0746  NA 142  --  142 139  K 3.9  --  4.4 3.5  CL 105  --  105 104  CO2 29  --  29 27  GLUCOSE 177*  --  125* 130*  BUN 13  --  22 19  CALCIUM 9.2  --  9.2 8.9  CREATININE 1.43* 1.40* 1.48*  1.32*  GFRNONAA 48*  --  46* 53*  GFRAA 56*  --  54* >60    LIVER FUNCTION TESTS: Recent Labs    08/30/17 1118 11/15/17 0947 11/22/17 0746  BILITOT 0.6 1.2 1.1  AST 17 22 22   ALT 6 12 9   ALKPHOS 98 88 88  PROT 6.5 7.1 6.9  ALBUMIN 3.5 4.0 3.9    TUMOR MARKERS: CEA - 36 (08/30/17)  Assessment and Plan:  Zachary Willis is a 72 y.o. male with past medical history significant for atrial fibrillation (chronic Coumadin), pituitary tumor (post resection) with chronic steroid supplementation, hypertension and hypothyroidism who returns to the interventional radiology clinic following technically successful image guided microwave ablation of biopsy-proven metastatic colon cancer on 11/22/2017.  Patient has recovered completely from the microwave ablation and is without complaint.  I am happy to report the patient statea his energy level is much improved and he has "not felt this great in quite some time."  He is in very good spirits and is overall very pleased with the procedure.  Post procedural LFTs remain normal.   Selected images from PET/CT performed 09/29/2017; abdominal MRI performed 10/20/2017 and intraprocedural images during microwave hepatic ablation performed 11/22/2017 were reviewed in detail with the patient and the patient's wife.  PLAN:  The patient will return to the interventional radiology clinic following acquisition of postprocedural abdominal MRI which is to be performed in late February 2020.  We will also obtain a CEA level at that time (as it was found to be elevated on 8/28 at 36).  Patient was encouraged to call the interventional radiology clinic with any interval questions or concerns and to touch base with Dr.  Marin Olp after the holidays.   A copy of this report was sent to the requesting provider on this date.  Electronically Signed: Sandi Mariscal 12/21/2017, 9:38 AM   I spent a total of 15 Minutes in face to face in clinical consultation, greater than 50% of which was counseling/coordinating care for post hepatic microwave ablation.

## 2017-12-26 ENCOUNTER — Ambulatory Visit: Payer: Medicare HMO | Admitting: Hematology & Oncology

## 2017-12-26 ENCOUNTER — Other Ambulatory Visit: Payer: Medicare HMO

## 2017-12-28 ENCOUNTER — Telehealth: Payer: Self-pay | Admitting: *Deleted

## 2017-12-28 ENCOUNTER — Inpatient Hospital Stay: Payer: Medicare HMO | Attending: Hematology & Oncology

## 2017-12-28 ENCOUNTER — Encounter: Payer: Self-pay | Admitting: Hematology & Oncology

## 2017-12-28 ENCOUNTER — Other Ambulatory Visit: Payer: Self-pay

## 2017-12-28 ENCOUNTER — Inpatient Hospital Stay: Payer: Medicare HMO

## 2017-12-28 ENCOUNTER — Inpatient Hospital Stay (HOSPITAL_BASED_OUTPATIENT_CLINIC_OR_DEPARTMENT_OTHER): Payer: Medicare HMO | Admitting: Hematology & Oncology

## 2017-12-28 VITALS — BP 128/62 | HR 64 | Temp 98.2°F | Resp 19 | Wt 263.0 lb

## 2017-12-28 DIAGNOSIS — Z7901 Long term (current) use of anticoagulants: Secondary | ICD-10-CM | POA: Diagnosis not present

## 2017-12-28 DIAGNOSIS — C787 Secondary malignant neoplasm of liver and intrahepatic bile duct: Principal | ICD-10-CM

## 2017-12-28 DIAGNOSIS — C189 Malignant neoplasm of colon, unspecified: Secondary | ICD-10-CM

## 2017-12-28 DIAGNOSIS — Z79899 Other long term (current) drug therapy: Secondary | ICD-10-CM | POA: Insufficient documentation

## 2017-12-28 DIAGNOSIS — Z95828 Presence of other vascular implants and grafts: Secondary | ICD-10-CM

## 2017-12-28 LAB — LACTATE DEHYDROGENASE: LDH: 232 U/L — AB (ref 98–192)

## 2017-12-28 LAB — CMP (CANCER CENTER ONLY)
ALK PHOS: 95 U/L (ref 38–126)
ALT: 9 U/L (ref 0–44)
ANION GAP: 6 (ref 5–15)
AST: 16 U/L (ref 15–41)
Albumin: 3.6 g/dL (ref 3.5–5.0)
BILIRUBIN TOTAL: 1 mg/dL (ref 0.3–1.2)
BUN: 15 mg/dL (ref 8–23)
CALCIUM: 8.9 mg/dL (ref 8.9–10.3)
CO2: 31 mmol/L (ref 22–32)
Chloride: 105 mmol/L (ref 98–111)
Creatinine: 1.33 mg/dL — ABNORMAL HIGH (ref 0.61–1.24)
GFR, EST NON AFRICAN AMERICAN: 54 mL/min — AB (ref >60)
GLUCOSE: 110 mg/dL — AB (ref 70–99)
POTASSIUM: 3.7 mmol/L (ref 3.5–5.1)
Sodium: 142 mmol/L (ref 135–145)
TOTAL PROTEIN: 6.5 g/dL (ref 6.5–8.1)

## 2017-12-28 LAB — CBC WITH DIFFERENTIAL (CANCER CENTER ONLY)
Abs Immature Granulocytes: 0.02 10*3/uL (ref 0.00–0.07)
BASOS PCT: 1 %
Basophils Absolute: 0 10*3/uL (ref 0.0–0.1)
EOS ABS: 0.3 10*3/uL (ref 0.0–0.5)
EOS PCT: 6 %
HEMATOCRIT: 40 % (ref 39.0–52.0)
Hemoglobin: 13.1 g/dL (ref 13.0–17.0)
IMMATURE GRANULOCYTES: 0 %
LYMPHS ABS: 1.5 10*3/uL (ref 0.7–4.0)
Lymphocytes Relative: 33 %
MCH: 29 pg (ref 26.0–34.0)
MCHC: 32.8 g/dL (ref 30.0–36.0)
MCV: 88.5 fL (ref 80.0–100.0)
MONO ABS: 0.4 10*3/uL (ref 0.1–1.0)
MONOS PCT: 8 %
NEUTROS PCT: 52 %
Neutro Abs: 2.3 10*3/uL (ref 1.7–7.7)
PLATELETS: 117 10*3/uL — AB (ref 150–400)
RBC: 4.52 MIL/uL (ref 4.22–5.81)
RDW: 14.4 % (ref 11.5–15.5)
WBC Count: 4.5 10*3/uL (ref 4.0–10.5)
nRBC: 0 % (ref 0.0–0.2)

## 2017-12-28 LAB — CEA (IN HOUSE-CHCC): CEA (CHCC-In House): 3.3 ng/mL (ref 0.00–5.00)

## 2017-12-28 MED ORDER — SODIUM CHLORIDE 0.9% FLUSH
10.0000 mL | INTRAVENOUS | Status: DC | PRN
  Administered 2017-12-28: 10 mL via INTRAVENOUS
  Filled 2017-12-28: qty 10

## 2017-12-28 MED ORDER — HEPARIN SOD (PORK) LOCK FLUSH 100 UNIT/ML IV SOLN
500.0000 [IU] | Freq: Once | INTRAVENOUS | Status: AC
  Administered 2017-12-28: 500 [IU] via INTRAVENOUS
  Filled 2017-12-28: qty 5

## 2017-12-28 NOTE — Telephone Encounter (Signed)
-----   Message from [name], MD sent at 12/28/2017  1:25 PM EST ----- Call - tumor marker CEA went from 36 down to 3!!!!  This is fantastic!!!  Happy New Year!!  [name]

## 2017-12-28 NOTE — Progress Notes (Signed)
Hematology and Oncology Follow Up Visit  Zachary Willis [id number] 06-Oct-1945 72 y.o. 12/28/2017   Principle Diagnosis:   Metastatic colon cancer  Current Therapy:    Status post RFA to the liver-11/21/2017      Interim History:  Zachary Willis is back for follow-up.  He looks fantastic.  He and Zachary wife had a wonderful Christmas yesterday.  He did undergo RFA to a couple liver lesions.  This was done in November.  This was successful.  We will follow-up with a PET scan in early February to see how everything looks.  Zachary Willis was 38.  I would like to think that this will be better.  He has had no problems with pain.  There is been no nausea or vomiting.  He has had no cough or shortness of breath.  He is on blood thinner.  He is on warfarin.  This is followed by cardiology.  He has had no issues with rashes.  He has had no change in bowel or bladder habits.  There is been no cardiac issues with the atrial fibrillation.  Overall, Zachary performance status is ECOG 1.   Medications:  Current Outpatient Medications:  .  allopurinol (ZYLOPRIM) 300 MG tablet, Take 300 mg by mouth every morning. , Disp: , Rfl:  .  doxazosin (CARDURA) 4 MG tablet, Take 4 mg by mouth at bedtime. , Disp: , Rfl: 1 .  ferrous sulfate 325 (65 FE) MG tablet, Take 325 mg by mouth daily., Disp: , Rfl:  .  furosemide (LASIX) 20 MG tablet, Take 20 mg by mouth daily. , Disp: , Rfl:  .  hydrocortisone (CORTEF) 10 MG tablet, Take 5-15 mg by mouth See admin instructions. Takes 15 mg in am and 5mg  in afternoon., Disp: , Rfl: 4 .  levothyroxine (SYNTHROID, LEVOTHROID) 88 MCG tablet, Take 88 mcg by mouth daily before breakfast. , Disp: , Rfl:  .  loperamide (IMODIUM) 2 MG capsule, Take 2 mg by mouth as needed for diarrhea or loose stools. , Disp: , Rfl:  .  metoprolol succinate (TOPROL-XL) 50 MG 24 hr tablet, Take 50 mg by mouth every morning. , Disp: , Rfl:  .  NASAL [place] NA, Place 1 spray into the nose daily. , Disp: ,  Rfl:  .  pantoprazole (PROTONIX) 40 MG tablet, Take 40 mg by mouth every morning. , Disp: , Rfl:  .  polyvinyl alcohol (LIQUIFILM TEARS) 1.4 % ophthalmic solution, Place 1 drop into both eyes daily as needed for dry eyes. , Disp: , Rfl:  .  potassium chloride (KLOR-CON 10) 10 MEQ tablet, Take 20 mEq by mouth daily., Disp: , Rfl:  .  prochlorperazine (COMPAZINE) 10 MG tablet, Take 10 mg by mouth every 8 (eight) hours as needed for nausea or vomiting. , Disp: , Rfl:  .  testosterone (ANDRODERM) 4 MG/24HR PT24 patch, Place 2 patches onto the skin daily. , Disp: , Rfl:  .  warfarin (COUMADIN) 5 MG tablet, Take 5 mg by mouth daily., Disp: , Rfl: 0  Allergies: No Known Allergies  Past Medical History, Surgical history, Social history, and Family History were reviewed and updated.  Review of Systems: Review of Systems  Constitutional: Negative.   HENT:  Negative.   Eyes: Negative.   Respiratory: Negative.   Cardiovascular: Negative.   Gastrointestinal: Negative.   Endocrine: Negative.   Genitourinary: Negative.    Musculoskeletal: Negative.   Skin: Negative.   Neurological: Negative.   Hematological: Negative.  Psychiatric/Behavioral: Negative.     Physical Exam:  weight is 263 lb (119.3 kg). Zachary oral temperature is 98.2 F (36.8 C). Zachary blood pressure is 128/62 and Zachary pulse is 64. Zachary respiration is 19 and oxygen saturation is 99%.   Wt Readings from Last 3 Encounters:  12/28/17 263 lb (119.3 kg)  12/21/17 260 lb (117.9 kg)  11/22/17 258 lb 6 oz (117.2 kg)    Physical Exam Vitals signs reviewed.  HENT:     Head: Normocephalic and atraumatic.  Eyes:     Pupils: Pupils are equal, round, and reactive to light.  Neck:     Musculoskeletal: Normal range of motion.  Cardiovascular:     Rate and Rhythm: Normal rate and regular rhythm.     Heart sounds: Normal heart sounds.     Comments: Regular rate and rhythm with an occasional extra beat. Pulmonary:     Effort: Pulmonary  effort is normal.     Breath sounds: Normal breath sounds.  Abdominal:     General: Bowel sounds are normal.     Palpations: Abdomen is soft.     Comments: Abdomen is soft.  He has multiple laparotomy scars.  There is no fluid wave.  There is no palpable liver or spleen tip.  Musculoskeletal: Normal range of motion.        General: No tenderness or deformity.  Lymphadenopathy:     Cervical: No cervical adenopathy.  Skin:    General: Skin is warm and dry.     Findings: No erythema or rash.  Neurological:     Mental Status: He is alert and oriented to person, place, and time.  Psychiatric:        Behavior: Behavior normal.        Thought Content: Thought content normal.        Judgment: Judgment normal.      Lab Results  Component Value Date   WBC 4.5 12/28/2017   HGB 13.1 12/28/2017   HCT 40.0 12/28/2017   MCV 88.5 12/28/2017   PLT 117 (L) 12/28/2017     Chemistry      Component Value Date/Time   NA 139 11/22/2017 0746   K 3.5 11/22/2017 0746   CL 104 11/22/2017 0746   CO2 27 11/22/2017 0746   BUN 19 11/22/2017 0746   CREATININE 1.32 (H) 11/22/2017 0746   CREATININE 1.43 (H) 08/30/2017 1118      Component Value Date/Time   CALCIUM 8.9 11/22/2017 0746   ALKPHOS 88 11/22/2017 0746   AST 22 11/22/2017 0746   AST 17 08/30/2017 1118   ALT 9 11/22/2017 0746   ALT 6 08/30/2017 1118   BILITOT 1.1 11/22/2017 0746   BILITOT 0.6 08/30/2017 1118       Impression and Plan: Zachary Willis is a 72 year old white male with metastatic colon cancer.  At this point, hopefully we can still hold off on systemic therapy for him.  We will see what the PET scan shows.  This will be helpful.  We will set the PET scan up for early February.  Of note, he does have a port in.  I will need to make sure that we continue to flush this.  He will have a flushed today when we see him back.  I am happy that Zachary quality of life is doing so well.  Hopefully, we will be able to hold off on  systemic therapy for him.     [name], MD 12/26/20198:25  AM

## 2017-12-28 NOTE — Telephone Encounter (Signed)
As noted below by Dr. Marin Olp, I informed patient of his tumor marker result. He verbalized understanding.

## 2018-01-08 ENCOUNTER — Telehealth: Payer: Self-pay | Admitting: *Deleted

## 2018-01-08 NOTE — Telephone Encounter (Signed)
Received faxed paperwork from [facility]. Paperwork given to MD.

## 2018-01-13 ENCOUNTER — Encounter (HOSPITAL_COMMUNITY): Payer: Self-pay | Admitting: Hematology & Oncology

## 2018-02-08 ENCOUNTER — Ambulatory Visit (HOSPITAL_COMMUNITY): Payer: Medicare HMO

## 2018-02-15 ENCOUNTER — Other Ambulatory Visit: Payer: Medicare HMO

## 2018-02-15 ENCOUNTER — Encounter (HOSPITAL_COMMUNITY)
Admission: RE | Admit: 2018-02-15 | Discharge: 2018-02-15 | Disposition: A | Discharge status: Home / Self Care | Payer: Medicare HMO | Source: Ambulatory Visit | Attending: Hematology & Oncology | Admitting: Hematology & Oncology

## 2018-02-15 ENCOUNTER — Encounter (HOSPITAL_COMMUNITY): Payer: Self-pay

## 2018-02-15 ENCOUNTER — Ambulatory Visit: Payer: Medicare HMO | Admitting: Hematology & Oncology

## 2018-02-15 DIAGNOSIS — C189 Malignant neoplasm of colon, unspecified: Secondary | ICD-10-CM | POA: Diagnosis present

## 2018-02-15 DIAGNOSIS — C787 Secondary malignant neoplasm of liver and intrahepatic bile duct: Secondary | ICD-10-CM | POA: Insufficient documentation

## 2018-02-15 LAB — GLUCOSE, CAPILLARY: Glucose-Capillary: 99 mg/dL (ref 70–99)

## 2018-02-15 MED ORDER — FLUDEOXYGLUCOSE F - 18 (FDG) INJECTION
13.3000 | Freq: Once | INTRAVENOUS | Status: AC | PRN
  Administered 2018-02-15: 13.3 via INTRAVENOUS

## 2018-02-19 ENCOUNTER — Encounter: Payer: Self-pay | Admitting: Hematology & Oncology

## 2018-02-19 ENCOUNTER — Inpatient Hospital Stay: Payer: Medicare HMO | Attending: Hematology & Oncology

## 2018-02-19 ENCOUNTER — Other Ambulatory Visit: Payer: Self-pay

## 2018-02-19 ENCOUNTER — Inpatient Hospital Stay (HOSPITAL_BASED_OUTPATIENT_CLINIC_OR_DEPARTMENT_OTHER): Payer: Medicare HMO | Admitting: Hematology & Oncology

## 2018-02-19 ENCOUNTER — Inpatient Hospital Stay: Payer: Medicare HMO

## 2018-02-19 VITALS — BP 99/58 | HR 55 | Temp 98.5°F | Resp 20 | Wt 254.0 lb

## 2018-02-19 DIAGNOSIS — C787 Secondary malignant neoplasm of liver and intrahepatic bile duct: Principal | ICD-10-CM

## 2018-02-19 DIAGNOSIS — Z79899 Other long term (current) drug therapy: Secondary | ICD-10-CM | POA: Diagnosis not present

## 2018-02-19 DIAGNOSIS — C189 Malignant neoplasm of colon, unspecified: Secondary | ICD-10-CM

## 2018-02-19 LAB — CMP (CANCER CENTER ONLY)
ALT: 10 U/L (ref 0–44)
AST: 17 U/L (ref 15–41)
Albumin: 3.8 g/dL (ref 3.5–5.0)
Alkaline Phosphatase: 98 U/L (ref 38–126)
Anion gap: 8 (ref 5–15)
BILIRUBIN TOTAL: 0.7 mg/dL (ref 0.3–1.2)
BUN: 13 mg/dL (ref 8–23)
CHLORIDE: 106 mmol/L (ref 98–111)
CO2: 28 mmol/L (ref 22–32)
CREATININE: 1.29 mg/dL — AB (ref 0.61–1.24)
Calcium: 8.9 mg/dL (ref 8.9–10.3)
GFR, Est AFR Am: >60 mL/min (ref >60)
GFR, Estimated: 56 mL/min — ABNORMAL LOW (ref >60)
Glucose, Bld: 136 mg/dL — ABNORMAL HIGH (ref 70–99)
Potassium: 3.4 mmol/L — ABNORMAL LOW (ref 3.5–5.1)
Sodium: 142 mmol/L (ref 135–145)
Total Protein: 6.2 g/dL — ABNORMAL LOW (ref 6.5–8.1)

## 2018-02-19 LAB — CBC WITH DIFFERENTIAL (CANCER CENTER ONLY)
Abs Immature Granulocytes: 0.02 10*3/uL (ref 0.00–0.07)
BASOS PCT: 1 %
Basophils Absolute: 0.1 10*3/uL (ref 0.0–0.1)
Eosinophils Absolute: 0.3 10*3/uL (ref 0.0–0.5)
Eosinophils Relative: 5 %
HCT: 39.4 % (ref 39.0–52.0)
Hemoglobin: 12.9 g/dL — ABNORMAL LOW (ref 13.0–17.0)
Immature Granulocytes: 0 %
Lymphocytes Relative: 23 %
Lymphs Abs: 1.3 10*3/uL (ref 0.7–4.0)
MCH: 29.4 pg (ref 26.0–34.0)
MCHC: 32.7 g/dL (ref 30.0–36.0)
MCV: 89.7 fL (ref 80.0–100.0)
MONOS PCT: 7 %
Monocytes Absolute: 0.4 10*3/uL (ref 0.1–1.0)
Neutro Abs: 3.8 10*3/uL (ref 1.7–7.7)
Neutrophils Relative %: 64 %
Platelet Count: 170 10*3/uL (ref 150–400)
RBC: 4.39 MIL/uL (ref 4.22–5.81)
RDW: 14.9 % (ref 11.5–15.5)
WBC Count: 5.9 10*3/uL (ref 4.0–10.5)
nRBC: 0 % (ref 0.0–0.2)

## 2018-02-19 LAB — LACTATE DEHYDROGENASE: LDH: 309 U/L — ABNORMAL HIGH (ref 98–192)

## 2018-02-19 NOTE — Patient Instructions (Signed)

## 2018-02-19 NOTE — Progress Notes (Signed)
Hematology and Oncology Follow Up Visit  Zachary Willis [id number] 04-29-1945 73 y.o. 02/19/2018   Principle Diagnosis:   Metastatic colon cancer  Current Therapy:    Status post RFA to the liver-11/21/2017      Interim History:  Zachary Willis is back for follow-up.  Things have really taken a turn for Zachary Willis.  He and his wife got back from a cruise to the [place].  When they got off the ship add [place], [place] he had to go to the emergency room because he was found to have influenza A.  Even more shocking is that while being observed in the emergency room at the hospital in [facility] he apparently had EKG changes that suggested an acute myocardial infarction.  He underwent cardiac cath done in [place] and was found to have 3 blockages.  He wanted to be treated up in [place].  He called his cardiologist up here.  He sees a Holiday representative at [facility] on Thursday.  Clearly, his prognosis is based upon his heart right now and that if he needed cardiac surgery, I have no problems with him having this in the face of his metastatic colon cancer.  As far as his colon cancer is concerned, he had a PET scan done last week.  The PET scan showed that he had increased activity and a 19 mm lesion in the central part of the liver.  It had an SUV of 15.3.  A second right hepatic lobe lesion had no SUV activity.  He has no disease elsewhere.  We are awaiting the CEA level today.  His last CEA level in December was down to 3.3.  Again, I have no problems with him having surgery for cardiac bypass if needed.  I think that his prognosis for his colon cancer is still several years.  He is on Coumadin.  He has had no bleeding.  There is been no change in bowel or bladder habits.  He has had no cough.  He has had no shortness of breath.  He has had no diaphoresis.  He has had no arm pain or jaw pain.  Overall, his performance status is ECOG 1.    Medications:  Current  Outpatient Medications:  .  allopurinol (ZYLOPRIM) 300 MG tablet, Take 300 mg by mouth every morning. , Disp: , Rfl:  .  [employer] ASPIRIN ADULT LOW DOSE 81 MG chewable tablet, Chew 81 mg by mouth daily., Disp: , Rfl:  .  doxazosin (CARDURA) 4 MG tablet, Take 4 mg by mouth at bedtime. , Disp: , Rfl: 1 .  ferrous sulfate 325 (65 FE) MG tablet, Take 325 mg by mouth daily., Disp: , Rfl:  .  furosemide (LASIX) 20 MG tablet, Take 20 mg by mouth daily. , Disp: , Rfl:  .  hydrocortisone (CORTEF) 10 MG tablet, Take 5-15 mg by mouth See admin instructions. Takes 15 mg in am and 5mg  in afternoon., Disp: , Rfl: 4 .  levothyroxine (SYNTHROID, LEVOTHROID) 88 MCG tablet, Take 88 mcg by mouth daily before breakfast. , Disp: , Rfl:  .  loperamide (IMODIUM) 2 MG capsule, Take 2 mg by mouth as needed for diarrhea or loose stools. , Disp: , Rfl:  .  metoprolol succinate (TOPROL-XL) 50 MG 24 hr tablet, Take 50 mg by mouth every morning. , Disp: , Rfl:  .  NASAL WASH NA, Place 1 spray into the nose daily. , Disp: , Rfl:  .  pantoprazole (  PROTONIX) 40 MG tablet, Take 40 mg by mouth every morning. , Disp: , Rfl:  .  polyvinyl alcohol (LIQUIFILM TEARS) 1.4 % ophthalmic solution, Place 1 drop into both eyes daily as needed for dry eyes. , Disp: , Rfl:  .  potassium chloride (KLOR-CON 10) 10 MEQ tablet, Take 20 mEq by mouth daily., Disp: , Rfl:  .  prochlorperazine (COMPAZINE) 10 MG tablet, Take 10 mg by mouth every 8 (eight) hours as needed for nausea or vomiting. , Disp: , Rfl:  .  testosterone (ANDRODERM) 4 MG/24HR PT24 patch, Place 2 patches onto the skin daily. , Disp: , Rfl:  .  warfarin (COUMADIN) 5 MG tablet, Take 5 mg by mouth daily., Disp: , Rfl: 0  Allergies: No Known Allergies  Past Medical History, Surgical history, Social history, and Family History were reviewed and updated.  Review of Systems: Review of Systems  Constitutional: Negative.   HENT:  Negative.   Eyes: Negative.   Respiratory: Negative.     Cardiovascular: Negative.   Gastrointestinal: Negative.   Endocrine: Negative.   Genitourinary: Negative.    Musculoskeletal: Negative.   Skin: Negative.   Neurological: Negative.   Hematological: Negative.   Psychiatric/Behavioral: Negative.     Physical Exam:  weight is 254 lb (115.2 kg). His oral temperature is 98.5 F (36.9 C). His blood pressure is 99/58 (abnormal) and his pulse is 55 (abnormal). His respiration is 20 and oxygen saturation is 100%.   Wt Readings from Last 3 Encounters:  02/19/18 254 lb (115.2 kg)  12/28/17 263 lb (119.3 kg)  12/21/17 260 lb (117.9 kg)    Physical Exam Vitals signs reviewed.  HENT:     Head: Normocephalic and atraumatic.  Eyes:     Pupils: Pupils are equal, round, and reactive to light.  Neck:     Musculoskeletal: Normal range of motion.  Cardiovascular:     Rate and Rhythm: Normal rate and regular rhythm.     Heart sounds: Normal heart sounds.     Comments: Regular rate and rhythm with an occasional extra beat. Pulmonary:     Effort: Pulmonary effort is normal.     Breath sounds: Normal breath sounds.  Abdominal:     General: Bowel sounds are normal.     Palpations: Abdomen is soft.     Comments: Abdomen is soft.  He has multiple laparotomy scars.  There is no fluid wave.  There is no palpable liver or spleen tip.  Musculoskeletal: Normal range of motion.        General: No tenderness or deformity.  Lymphadenopathy:     Cervical: No cervical adenopathy.  Skin:    General: Skin is warm and dry.     Findings: No erythema or rash.  Neurological:     Mental Status: He is alert and oriented to person, place, and time.  Psychiatric:        Behavior: Behavior normal.        Thought Content: Thought content normal.        Judgment: Judgment normal.      Lab Results  Component Value Date   WBC 5.9 02/19/2018   HGB 12.9 (L) 02/19/2018   HCT 39.4 02/19/2018   MCV 89.7 02/19/2018   PLT 170 02/19/2018     Chemistry       Component Value Date/Time   NA 142 02/19/2018 1153   K 3.4 (L) 02/19/2018 1153   CL 106 02/19/2018 1153   CO2 28 02/19/2018 1153  BUN 13 02/19/2018 1153   CREATININE 1.29 (H) 02/19/2018 1153      Component Value Date/Time   CALCIUM 8.9 02/19/2018 1153   ALKPHOS 98 02/19/2018 1153   AST 17 02/19/2018 1153   ALT 10 02/19/2018 1153   BILITOT 0.7 02/19/2018 1153       Impression and Plan: [name] is a 73 year old white male with metastatic colon cancer.  For right now, he hopefully will have cardiac surgery within a week or so.  It sounds like this is significant with his coronary artery blockages.  I told Mr. and Mrs. Omar to have the cardiac surgeon call me if he has any questions.  I will speak to interventional radiology to see if they can do another RFA procedure or if they think that TACE might be reasonable for this active lesion.  I do not think that a surgical oncologist will touch Mr. Heslin with his cardiac issues for at least 6 months.  I would hate to use systemic chemotherapy since we really only have 1 lesion to deal with with respect to his metastatic colon cancer.  This is clearly complicated.  I spent about 45 minutes with Mr. Grider and his wife.  I would like to see them back in 4 weeks.  I would hope that by then, his cardiac status will be much better known.     [name], MD 2/17/20202:08 PM

## 2018-02-21 ENCOUNTER — Telehealth: Payer: Self-pay | Admitting: *Deleted

## 2018-02-21 LAB — CEA (IN HOUSE-CHCC): CEA (CHCC-In House): 3.24 ng/mL (ref 0.00–5.00)

## 2018-02-21 NOTE — Telephone Encounter (Signed)
-----   Message from [name], MD sent at 02/21/2018 10:39 AM EST ----- Call - the CEA tumor marker is still normal at 3.24!!  Zachary Willis

## 2018-02-21 NOTE — Telephone Encounter (Signed)
As noted below by Dr. Marin Olp, I informed the patient that the CEA tumor marker is still normal at 3.24. He verbalized understanding.

## 2018-03-05 DIAGNOSIS — I2510 Atherosclerotic heart disease of native coronary artery without angina pectoris: Secondary | ICD-10-CM | POA: Insufficient documentation

## 2018-03-06 ENCOUNTER — Other Ambulatory Visit: Payer: Self-pay | Admitting: Interventional Radiology

## 2018-03-06 DIAGNOSIS — C787 Secondary malignant neoplasm of liver and intrahepatic bile duct: Principal | ICD-10-CM

## 2018-03-06 DIAGNOSIS — C189 Malignant neoplasm of colon, unspecified: Secondary | ICD-10-CM

## 2018-03-21 ENCOUNTER — Other Ambulatory Visit: Payer: Self-pay | Admitting: Interventional Radiology

## 2018-03-21 DIAGNOSIS — C189 Malignant neoplasm of colon, unspecified: Secondary | ICD-10-CM

## 2018-03-21 DIAGNOSIS — C787 Secondary malignant neoplasm of liver and intrahepatic bile duct: Principal | ICD-10-CM

## 2018-03-28 ENCOUNTER — Inpatient Hospital Stay: Payer: Medicare HMO | Admitting: Hematology & Oncology

## 2018-03-28 ENCOUNTER — Inpatient Hospital Stay: Payer: Medicare HMO

## 2018-03-29 ENCOUNTER — Ambulatory Visit (HOSPITAL_COMMUNITY): Payer: Medicare HMO

## 2018-04-11 ENCOUNTER — Ambulatory Visit (HOSPITAL_COMMUNITY): Payer: Medicare HMO

## 2018-04-11 ENCOUNTER — Other Ambulatory Visit: Payer: Medicare HMO

## 2018-05-03 ENCOUNTER — Inpatient Hospital Stay: Payer: Medicare HMO

## 2018-05-03 ENCOUNTER — Other Ambulatory Visit: Payer: Self-pay

## 2018-05-03 ENCOUNTER — Inpatient Hospital Stay (HOSPITAL_BASED_OUTPATIENT_CLINIC_OR_DEPARTMENT_OTHER): Payer: Medicare HMO | Admitting: Hematology & Oncology

## 2018-05-03 ENCOUNTER — Telehealth: Payer: Self-pay | Admitting: Hematology & Oncology

## 2018-05-03 ENCOUNTER — Encounter: Payer: Self-pay | Admitting: Hematology & Oncology

## 2018-05-03 ENCOUNTER — Inpatient Hospital Stay: Payer: Medicare HMO | Attending: Hematology & Oncology

## 2018-05-03 VITALS — BP 138/83 | HR 66 | Temp 98.4°F | Resp 18 | Wt 243.0 lb

## 2018-05-03 DIAGNOSIS — C189 Malignant neoplasm of colon, unspecified: Secondary | ICD-10-CM

## 2018-05-03 DIAGNOSIS — C787 Secondary malignant neoplasm of liver and intrahepatic bile duct: Principal | ICD-10-CM

## 2018-05-03 DIAGNOSIS — Z7901 Long term (current) use of anticoagulants: Secondary | ICD-10-CM | POA: Diagnosis not present

## 2018-05-03 DIAGNOSIS — Z79899 Other long term (current) drug therapy: Secondary | ICD-10-CM

## 2018-05-03 DIAGNOSIS — K769 Liver disease, unspecified: Secondary | ICD-10-CM

## 2018-05-03 DIAGNOSIS — I4891 Unspecified atrial fibrillation: Secondary | ICD-10-CM

## 2018-05-03 DIAGNOSIS — Z95828 Presence of other vascular implants and grafts: Secondary | ICD-10-CM

## 2018-05-03 LAB — CBC WITH DIFFERENTIAL (CANCER CENTER ONLY)
Abs Immature Granulocytes: 0.02 10*3/uL (ref 0.00–0.07)
Basophils Absolute: 0.1 10*3/uL (ref 0.0–0.1)
Basophils Relative: 1 %
Eosinophils Absolute: 0.3 10*3/uL (ref 0.0–0.5)
Eosinophils Relative: 5 %
HCT: 41.1 % (ref 39.0–52.0)
Hemoglobin: 13.2 g/dL (ref 13.0–17.0)
Immature Granulocytes: 0 %
Lymphocytes Relative: 34 %
Lymphs Abs: 2.5 10*3/uL (ref 0.7–4.0)
MCH: 28.6 pg (ref 26.0–34.0)
MCHC: 32.1 g/dL (ref 30.0–36.0)
MCV: 89.2 fL (ref 80.0–100.0)
Monocytes Absolute: 0.5 10*3/uL (ref 0.1–1.0)
Monocytes Relative: 7 %
Neutro Abs: 3.8 10*3/uL (ref 1.7–7.7)
Neutrophils Relative %: 53 %
Platelet Count: 152 10*3/uL (ref 150–400)
RBC: 4.61 MIL/uL (ref 4.22–5.81)
RDW: 14.3 % (ref 11.5–15.5)
WBC Count: 7.2 10*3/uL (ref 4.0–10.5)
nRBC: 0 % (ref 0.0–0.2)

## 2018-05-03 LAB — CMP (CANCER CENTER ONLY)
ALT: 19 U/L (ref 0–44)
AST: 22 U/L (ref 15–41)
Albumin: 3.9 g/dL (ref 3.5–5.0)
Alkaline Phosphatase: 93 U/L (ref 38–126)
Anion gap: 7 (ref 5–15)
BUN: 23 mg/dL (ref 8–23)
CO2: 30 mmol/L (ref 22–32)
Calcium: 9.5 mg/dL (ref 8.9–10.3)
Chloride: 105 mmol/L (ref 98–111)
Creatinine: 1.36 mg/dL — ABNORMAL HIGH (ref 0.61–1.24)
GFR, Est AFR Am: >60 mL/min (ref >60)
GFR, Estimated: 52 mL/min — ABNORMAL LOW (ref >60)
Glucose, Bld: 112 mg/dL — ABNORMAL HIGH (ref 70–99)
Potassium: 4.4 mmol/L (ref 3.5–5.1)
Sodium: 142 mmol/L (ref 135–145)
Total Bilirubin: 0.7 mg/dL (ref 0.3–1.2)
Total Protein: 6.8 g/dL (ref 6.5–8.1)

## 2018-05-03 LAB — CEA (IN HOUSE-CHCC): CEA (CHCC-In House): 12.37 ng/mL — ABNORMAL HIGH (ref 0.00–5.00)

## 2018-05-03 MED ORDER — SODIUM CHLORIDE 0.9% FLUSH
10.0000 mL | Freq: Once | INTRAVENOUS | Status: AC
  Administered 2018-05-03: 10 mL via INTRAVENOUS
  Filled 2018-05-03: qty 10

## 2018-05-03 MED ORDER — HEPARIN SOD (PORK) LOCK FLUSH 100 UNIT/ML IV SOLN
500.0000 [IU] | Freq: Once | INTRAVENOUS | Status: AC
  Administered 2018-05-03: 500 [IU] via INTRAVENOUS
  Filled 2018-05-03: qty 5

## 2018-05-03 NOTE — Telephone Encounter (Signed)
sw pt to confirm 5/27 appt at 0930 per 4/30 LOS

## 2018-05-03 NOTE — Progress Notes (Signed)
Hematology and Oncology Follow Up Visit  Zachary Willis [id number] 08/20/1945 73 y.o. 05/03/2018   Principle Diagnosis:   Metastatic colon cancer  Current Therapy:    Status post RFA to the liver-11/21/2017      Interim History:  Zachary Willis is back for follow-up.  He really looks fantastic.  He had cardiac surgery on March 11.  He had triple bypass done.  This was done at [facility].  He was in the hospital for a week.  He is doing well.  He starts cardiac rehab next week.  He now needs to deal with the liver med.  I will have to reset his interventional radiology appointment.  He will need an MRI of the liver to see how this cancer looks now.  His last CEA level when we saw him in February was only 3.24.  He feels okay.  He has had no abdominal pain.  There is been no change in bowel or bladder habits.  He is doing Coumadin.  He is on lifelong Coumadin because of atrial fibrillation.  He has had no problems with bleeding.  There is been no leg swelling.  He has had no fever.    Overall, his performance status is ECOG 1.    Medications:  Current Outpatient Medications:  .  allopurinol (ZYLOPRIM) 300 MG tablet, Take 300 mg by mouth every morning. , Disp: , Rfl:  .  [employer] ASPIRIN ADULT LOW DOSE 81 MG chewable tablet, Chew 81 mg by mouth daily., Disp: , Rfl:  .  doxazosin (CARDURA) 4 MG tablet, Take 4 mg by mouth at bedtime. , Disp: , Rfl: 1 .  ferrous sulfate 325 (65 FE) MG tablet, Take 325 mg by mouth daily., Disp: , Rfl:  .  furosemide (LASIX) 20 MG tablet, Take 20 mg by mouth daily. , Disp: , Rfl:  .  hydrocortisone (CORTEF) 10 MG tablet, Take 5-15 mg by mouth See admin instructions. Takes 15 mg in am and 5mg  in afternoon., Disp: , Rfl: 4 .  levothyroxine (SYNTHROID, LEVOTHROID) 88 MCG tablet, Take 88 mcg by mouth daily before breakfast. , Disp: , Rfl:  .  loperamide (IMODIUM) 2 MG capsule, Take 2 mg by mouth as needed for diarrhea or loose stools. , Disp: , Rfl:  .   metoprolol succinate (TOPROL-XL) 50 MG 24 hr tablet, Take 50 mg by mouth every morning. , Disp: , Rfl:  .  NASAL WASH NA, Place 1 spray into the nose daily. , Disp: , Rfl:  .  pantoprazole (PROTONIX) 40 MG tablet, Take 40 mg by mouth every morning. , Disp: , Rfl:  .  polyvinyl alcohol (LIQUIFILM TEARS) 1.4 % ophthalmic solution, Place 1 drop into both eyes daily as needed for dry eyes. , Disp: , Rfl:  .  potassium chloride (KLOR-CON 10) 10 MEQ tablet, Take 20 mEq by mouth daily., Disp: , Rfl:  .  prochlorperazine (COMPAZINE) 10 MG tablet, Take 10 mg by mouth every 8 (eight) hours as needed for nausea or vomiting. , Disp: , Rfl:  .  testosterone (ANDRODERM) 4 MG/24HR PT24 patch, Place 2 patches onto the skin daily. , Disp: , Rfl:  .  warfarin (COUMADIN) 5 MG tablet, Take 5 mg by mouth daily., Disp: , Rfl: 0  Allergies: No Known Allergies  Past Medical History, Surgical history, Social history, and Family History were reviewed and updated.  Review of Systems: Review of Systems  Constitutional: Negative.   HENT:  Negative.   Eyes:  Negative.   Respiratory: Negative.   Cardiovascular: Negative.   Gastrointestinal: Negative.   Endocrine: Negative.   Genitourinary: Negative.    Musculoskeletal: Negative.   Skin: Negative.   Neurological: Negative.   Hematological: Negative.   Psychiatric/Behavioral: Negative.     Physical Exam:  vitals were not taken for this visit.   Wt Readings from Last 3 Encounters:  02/19/18 254 lb (115.2 kg)  12/28/17 263 lb (119.3 kg)  12/21/17 260 lb (117.9 kg)    Physical Exam Vitals signs reviewed.  HENT:     Head: Normocephalic and atraumatic.  Eyes:     Pupils: Pupils are equal, round, and reactive to light.  Neck:     Musculoskeletal: Normal range of motion.  Cardiovascular:     Rate and Rhythm: Normal rate and regular rhythm.     Heart sounds: Normal heart sounds.     Comments: Regular rate and rhythm with an occasional extra beat. Pulmonary:      Effort: Pulmonary effort is normal.     Breath sounds: Normal breath sounds.  Abdominal:     General: Bowel sounds are normal.     Palpations: Abdomen is soft.     Comments: Abdomen is soft.  He has multiple laparotomy scars.  There is no fluid wave.  There is no palpable liver or spleen tip.  Musculoskeletal: Normal range of motion.        General: No tenderness or deformity.  Lymphadenopathy:     Cervical: No cervical adenopathy.  Skin:    General: Skin is warm and dry.     Findings: No erythema or rash.  Neurological:     Mental Status: He is alert and oriented to person, place, and time.  Psychiatric:        Behavior: Behavior normal.        Thought Content: Thought content normal.        Judgment: Judgment normal.      Lab Results  Component Value Date   WBC 7.2 05/03/2018   HGB 13.2 05/03/2018   HCT 41.1 05/03/2018   MCV 89.2 05/03/2018   PLT 152 05/03/2018     Chemistry      Component Value Date/Time   NA 142 02/19/2018 1153   K 3.4 (L) 02/19/2018 1153   CL 106 02/19/2018 1153   CO2 28 02/19/2018 1153   BUN 13 02/19/2018 1153   CREATININE 1.29 (H) 02/19/2018 1153      Component Value Date/Time   CALCIUM 8.9 02/19/2018 1153   ALKPHOS 98 02/19/2018 1153   AST 17 02/19/2018 1153   ALT 10 02/19/2018 1153   BILITOT 0.7 02/19/2018 1153       Impression and Plan: Zachary Willis is a 73 year old white male with metastatic colon cancer.  Now that he got through his cardiac surgery, we can focus back onto the liver lesion.  Hopefully, we will find this lesion has not grown all that much.  Hopefully this lesion will be amenable to TACE or some form of intrahepatic therapy.  Since this is the only site of metastatic disease, I think that intrahepatic therapy would be the best way to go.  We will flush his Port-A-Cath today.  We will plan to get him back to see [place] in another 3 weeks.  Hopefully, he will be in the process or maybe have done a procedure for the  liver lesion.       [name], MD 4/30/20208:08 AM

## 2018-05-03 NOTE — Patient Instructions (Signed)

## 2018-05-30 ENCOUNTER — Inpatient Hospital Stay: Payer: Medicare HMO

## 2018-05-30 ENCOUNTER — Other Ambulatory Visit: Payer: Self-pay

## 2018-05-30 ENCOUNTER — Encounter: Payer: Self-pay | Admitting: Hematology & Oncology

## 2018-05-30 ENCOUNTER — Inpatient Hospital Stay: Payer: Medicare HMO | Attending: Hematology & Oncology | Admitting: Hematology & Oncology

## 2018-05-30 VITALS — BP 131/81 | HR 72 | Temp 97.4°F | Resp 18 | Wt 243.3 lb

## 2018-05-30 DIAGNOSIS — Z7901 Long term (current) use of anticoagulants: Secondary | ICD-10-CM | POA: Insufficient documentation

## 2018-05-30 DIAGNOSIS — C787 Secondary malignant neoplasm of liver and intrahepatic bile duct: Secondary | ICD-10-CM

## 2018-05-30 DIAGNOSIS — C189 Malignant neoplasm of colon, unspecified: Secondary | ICD-10-CM | POA: Diagnosis present

## 2018-05-30 DIAGNOSIS — Z79899 Other long term (current) drug therapy: Secondary | ICD-10-CM | POA: Insufficient documentation

## 2018-05-30 DIAGNOSIS — K769 Liver disease, unspecified: Secondary | ICD-10-CM | POA: Insufficient documentation

## 2018-05-30 DIAGNOSIS — I4891 Unspecified atrial fibrillation: Secondary | ICD-10-CM | POA: Diagnosis not present

## 2018-05-30 LAB — CBC WITH DIFFERENTIAL (CANCER CENTER ONLY)
Abs Immature Granulocytes: 0.01 10*3/uL (ref 0.00–0.07)
Basophils Absolute: 0.1 10*3/uL (ref 0.0–0.1)
Basophils Relative: 1 %
Eosinophils Absolute: 0.3 10*3/uL (ref 0.0–0.5)
Eosinophils Relative: 5 %
HCT: 41.3 % (ref 39.0–52.0)
Hemoglobin: 13.4 g/dL (ref 13.0–17.0)
Immature Granulocytes: 0 %
Lymphocytes Relative: 31 %
Lymphs Abs: 2.1 10*3/uL (ref 0.7–4.0)
MCH: 28.5 pg (ref 26.0–34.0)
MCHC: 32.4 g/dL (ref 30.0–36.0)
MCV: 87.7 fL (ref 80.0–100.0)
Monocytes Absolute: 0.5 10*3/uL (ref 0.1–1.0)
Monocytes Relative: 8 %
Neutro Abs: 3.7 10*3/uL (ref 1.7–7.7)
Neutrophils Relative %: 55 %
Platelet Count: 145 10*3/uL — ABNORMAL LOW (ref 150–400)
RBC: 4.71 MIL/uL (ref 4.22–5.81)
RDW: 14.6 % (ref 11.5–15.5)
WBC Count: 6.7 10*3/uL (ref 4.0–10.5)
nRBC: 0 % (ref 0.0–0.2)

## 2018-05-30 LAB — CMP (CANCER CENTER ONLY)
ALT: 15 U/L (ref 0–44)
AST: 19 U/L (ref 15–41)
Albumin: 4 g/dL (ref 3.5–5.0)
Alkaline Phosphatase: 90 U/L (ref 38–126)
Anion gap: 6 (ref 5–15)
BUN: 23 mg/dL (ref 8–23)
CO2: 31 mmol/L (ref 22–32)
Calcium: 9.3 mg/dL (ref 8.9–10.3)
Chloride: 105 mmol/L (ref 98–111)
Creatinine: 1.26 mg/dL — ABNORMAL HIGH (ref 0.61–1.24)
GFR, Est AFR Am: >60 mL/min (ref >60)
GFR, Estimated: 57 mL/min — ABNORMAL LOW (ref >60)
Glucose, Bld: 130 mg/dL — ABNORMAL HIGH (ref 70–99)
Potassium: 4.5 mmol/L (ref 3.5–5.1)
Sodium: 142 mmol/L (ref 135–145)
Total Bilirubin: 0.7 mg/dL (ref 0.3–1.2)
Total Protein: 7 g/dL (ref 6.5–8.1)

## 2018-05-30 LAB — CEA (IN HOUSE-CHCC): CEA (CHCC-In House): 18.05 ng/mL — ABNORMAL HIGH (ref 0.00–5.00)

## 2018-05-30 NOTE — Progress Notes (Signed)
Hematology and Oncology Follow Up Visit  Zachary Willis [id number] 1945-07-14 73 y.o. 05/30/2018   Principle Diagnosis:   Metastatic colon cancer  Current Therapy:    Status post RFA to the liver-11/21/2017      Interim History:  Zachary Willis is back for follow-up.  Unfortunately, he still has not yet had any type of intrahepatic therapy for the liver metastasis.  I think with the coronavirus, it is been difficult for radiology to get everything scheduled.  He says he is scheduled for an MRI on June 12.  His last CEA was up to 12.3.  This is a little bit troublesome for me.  He does not complain of any pain.  He has had a good appetite.  He has had no change in bowel or bladder habits.  There is been no rashes.  He has had no bleeding.  He is on Coumadin and doing okay with this.  He is doing his cardiac rehabilitation.  He enjoys this.  Overall, his performance status is ECOG 1.    Medications:  Current Outpatient Medications:    allopurinol (ZYLOPRIM) 300 MG tablet, Take 300 mg by mouth every morning. , Disp: , Rfl:    atorvastatin (LIPITOR) 40 MG tablet, Take 40 mg by mouth daily., Disp: , Rfl:    carvedilol (COREG) 3.125 MG tablet, Take 6.25 mg by mouth 2 (two) times daily. , Disp: , Rfl:    CVS ASPIRIN ADULT LOW DOSE 81 MG chewable tablet, Chew 81 mg by mouth daily., Disp: , Rfl:    CVS STOOL SOFTENER/LAXATIVE 8.6-50 MG tablet, TAKE 2 TABLETS EVERY EVENING, Disp: , Rfl:    ferrous sulfate 325 (65 FE) MG tablet, Take 325 mg by mouth daily., Disp: , Rfl:    furosemide (LASIX) 20 MG tablet, Take 20 mg by mouth daily. , Disp: , Rfl:    hydrocortisone (CORTEF) 10 MG tablet, Take 5-15 mg by mouth See admin instructions. Takes 15 mg in am and 5mg  in afternoon., Disp: , Rfl: 4   levothyroxine (SYNTHROID, LEVOTHROID) 88 MCG tablet, Take 88 mcg by mouth daily before breakfast. , Disp: , Rfl:    loperamide (IMODIUM) 2 MG capsule, Take 2 mg by mouth as needed for diarrhea or  loose stools. , Disp: , Rfl:    NASAL [place] NA, Place 1 spray into the nose daily. , Disp: , Rfl:    pantoprazole (PROTONIX) 40 MG tablet, Take 40 mg by mouth every morning. , Disp: , Rfl:    polyvinyl alcohol (LIQUIFILM TEARS) 1.4 % ophthalmic solution, Place 1 drop into both eyes daily as needed for dry eyes. , Disp: , Rfl:    potassium chloride (KLOR-CON 10) 10 MEQ tablet, Take 20 mEq by mouth daily., Disp: , Rfl:    prochlorperazine (COMPAZINE) 10 MG tablet, Take 10 mg by mouth every 8 (eight) hours as needed for nausea or vomiting. , Disp: , Rfl:    testosterone (ANDRODERM) 4 MG/24HR PT24 patch, Place 1 patch onto the skin daily. , Disp: , Rfl:    warfarin (COUMADIN) 5 MG tablet, Take 5 mg by mouth daily., Disp: , Rfl: 0  Allergies: No Known Allergies  Past Medical History, Surgical history, Social history, and Family History were reviewed and updated.  Review of Systems: Review of Systems  Constitutional: Negative.   HENT:  Negative.   Eyes: Negative.   Respiratory: Negative.   Cardiovascular: Negative.   Gastrointestinal: Negative.   Endocrine: Negative.   Genitourinary: Negative.  Musculoskeletal: Negative.   Skin: Negative.   Neurological: Negative.   Hematological: Negative.   Psychiatric/Behavioral: Negative.     Physical Exam:  weight is 243 lb 4.8 oz (110.4 kg). His oral temperature is 97.4 F (36.3 C) (abnormal). His blood pressure is 131/81 and his pulse is 72. His respiration is 18 and oxygen saturation is 100%.   Wt Readings from Last 3 Encounters:  05/30/18 243 lb 4.8 oz (110.4 kg)  05/03/18 243 lb (110.2 kg)  02/19/18 254 lb (115.2 kg)    Physical Exam Vitals signs reviewed.  HENT:     Head: Normocephalic and atraumatic.  Eyes:     Pupils: Pupils are equal, round, and reactive to light.  Neck:     Musculoskeletal: Normal range of motion.  Cardiovascular:     Rate and Rhythm: Normal rate and regular rhythm.     Heart sounds: Normal heart  sounds.     Comments: Regular rate and rhythm with an occasional extra beat. Pulmonary:     Effort: Pulmonary effort is normal.     Breath sounds: Normal breath sounds.  Abdominal:     General: Bowel sounds are normal.     Palpations: Abdomen is soft.     Comments: Abdomen is soft.  He has multiple laparotomy scars.  There is no fluid wave.  There is no palpable liver or spleen tip.  Musculoskeletal: Normal range of motion.        General: No tenderness or deformity.  Lymphadenopathy:     Cervical: No cervical adenopathy.  Skin:    General: Skin is warm and dry.     Findings: No erythema or rash.  Neurological:     Mental Status: He is alert and oriented to person, place, and time.  Psychiatric:        Behavior: Behavior normal.        Thought Content: Thought content normal.        Judgment: Judgment normal.      Lab Results  Component Value Date   WBC 6.7 05/30/2018   HGB 13.4 05/30/2018   HCT 41.3 05/30/2018   MCV 87.7 05/30/2018   PLT 145 (L) 05/30/2018     Chemistry      Component Value Date/Time   NA 142 05/03/2018 0744   K 4.4 05/03/2018 0744   CL 105 05/03/2018 0744   CO2 30 05/03/2018 0744   BUN 23 05/03/2018 0744   CREATININE 1.36 (H) 05/03/2018 0744      Component Value Date/Time   CALCIUM 9.5 05/03/2018 0744   ALKPHOS 93 05/03/2018 0744   AST 22 05/03/2018 0744   ALT 19 05/03/2018 0744   BILITOT 0.7 05/03/2018 0744       Impression and Plan: Zachary Willis is a 73 year old white male with metastatic colon cancer.  I really hope that we are not going to miss any opportunity to treat this liver lesion.  Again, I realize the issues with the coronavirus and try to get everything organized for treatment.  We will have to see what the MRI shows.  It will be interesting to see what the CEA level is going to be.  I will get back in 4 weeks.  Hopefully by then something would have been done with his liver.    [name], MD 5/27/202010:13 AM

## 2018-06-01 ENCOUNTER — Telehealth: Payer: Self-pay | Admitting: *Deleted

## 2018-06-01 NOTE — Telephone Encounter (Signed)
Call received back from patient and patient notified that CEA is 18 and that radiology will try to get you in soon per order of Dr. Marin Olp.  Patient appreciative of information and has no questions at this time.

## 2018-06-01 NOTE — Telephone Encounter (Signed)
-----   Message from [name], MD sent at 05/31/2018  4:26 PM EDT ----- Call - the CEA is 18.  Radiology will try to get you in soon!!!  [place]

## 2018-06-08 ENCOUNTER — Telehealth: Payer: Self-pay | Admitting: Interventional Radiology

## 2018-06-08 NOTE — Telephone Encounter (Signed)
Spoke with Zachary Willis today via the telephone.  I apologized, explaining that I was anticipating seeing him in clinic yesterday but for some reason he did not make it onto my clinic schedule.  Zachary Willis tells me that he underwent a quadruple bypass in March of this year though he is recovery well, participating with cardiac rehab and overall feels "great."    He tells me that Dr. Marin Olp has ordered an abdominal MRI scheduled for this coming Friday (June 12).  I explained that I will coordinate for him to also undergo a surveillance CT scan of the chest, abdomen and pelvis.  I am out of town next week but will review his imaging examinations upon my return and either have Zachary Willis come to see me at my next clinic day or will arrange to do a telephone conference to coordinate his next plan of care.  The patient has demonstrated excellent understanding of the above conversation is agreement with the plan of care.  The patient knows to call the interventional radiology clinic with any interval questions or concerns.  Zachary Bacon, MD Pager #: [phone]

## 2018-06-11 ENCOUNTER — Other Ambulatory Visit: Payer: Self-pay | Admitting: Interventional Radiology

## 2018-06-11 DIAGNOSIS — C787 Secondary malignant neoplasm of liver and intrahepatic bile duct: Secondary | ICD-10-CM

## 2018-06-11 DIAGNOSIS — R970 Elevated carcinoembryonic antigen [CEA]: Secondary | ICD-10-CM

## 2018-06-11 DIAGNOSIS — R772 Abnormality of alphafetoprotein: Secondary | ICD-10-CM

## 2018-06-11 DIAGNOSIS — C189 Malignant neoplasm of colon, unspecified: Secondary | ICD-10-CM

## 2018-06-13 ENCOUNTER — Other Ambulatory Visit: Payer: Self-pay | Admitting: Interventional Radiology

## 2018-06-15 ENCOUNTER — Other Ambulatory Visit: Payer: Self-pay

## 2018-06-15 ENCOUNTER — Ambulatory Visit
Admission: RE | Admit: 2018-06-15 | Discharge: 2018-06-15 | Disposition: A | Discharge status: Home / Self Care | Payer: Medicare HMO | Source: Ambulatory Visit | Attending: Hematology & Oncology | Admitting: Hematology & Oncology

## 2018-06-15 DIAGNOSIS — C787 Secondary malignant neoplasm of liver and intrahepatic bile duct: Secondary | ICD-10-CM

## 2018-06-15 DIAGNOSIS — C189 Malignant neoplasm of colon, unspecified: Secondary | ICD-10-CM

## 2018-06-15 MED ORDER — GADOBENATE DIMEGLUMINE 529 MG/ML IV SOLN
20.0000 mL | Freq: Once | INTRAVENOUS | Status: AC | PRN
  Administered 2018-06-15: 20 mL via INTRAVENOUS

## 2018-06-18 ENCOUNTER — Ambulatory Visit (HOSPITAL_COMMUNITY)
Admission: RE | Admit: 2018-06-18 | Discharge: 2018-06-18 | Disposition: A | Discharge status: Home / Self Care | Payer: Medicare HMO | Source: Ambulatory Visit | Attending: Interventional Radiology | Admitting: Interventional Radiology

## 2018-06-18 ENCOUNTER — Other Ambulatory Visit: Payer: Self-pay

## 2018-06-18 DIAGNOSIS — C189 Malignant neoplasm of colon, unspecified: Secondary | ICD-10-CM | POA: Insufficient documentation

## 2018-06-18 DIAGNOSIS — C787 Secondary malignant neoplasm of liver and intrahepatic bile duct: Secondary | ICD-10-CM

## 2018-06-18 DIAGNOSIS — R970 Elevated carcinoembryonic antigen [CEA]: Secondary | ICD-10-CM | POA: Insufficient documentation

## 2018-06-18 MED ORDER — SODIUM CHLORIDE (PF) 0.9 % IJ SOLN
INTRAMUSCULAR | Status: AC
  Filled 2018-06-18: qty 50

## 2018-06-18 MED ORDER — IOHEXOL 300 MG/ML  SOLN
100.0000 mL | Freq: Once | INTRAMUSCULAR | Status: AC | PRN
  Administered 2018-06-18: 100 mL via INTRAVENOUS

## 2018-06-19 ENCOUNTER — Encounter: Payer: Self-pay | Admitting: *Deleted

## 2018-06-19 ENCOUNTER — Ambulatory Visit
Admission: RE | Admit: 2018-06-19 | Discharge: 2018-06-19 | Disposition: A | Discharge status: Home / Self Care | Payer: Medicare HMO | Source: Ambulatory Visit | Attending: Interventional Radiology | Admitting: Interventional Radiology

## 2018-06-19 DIAGNOSIS — C189 Malignant neoplasm of colon, unspecified: Secondary | ICD-10-CM

## 2018-06-19 HISTORY — PX: IR RADIOLOGIST EVAL & MGMT: IMG5224

## 2018-06-19 NOTE — Consult Note (Signed)
Chief Complaint: Metastatic colon cancer  Referring Physician(s): Ennever  History of Present Illness:  Zachary Willis is a 73 y.o. male with past medical history significant for atrial fibrillation (chronic Coumadin), pituitary tumor (post resection) with chronic steroid supplementation, hypertension and hypothyroidism who initially presented to the interventional radiology clinic on 10/10/2017 for evaluation of liver directed therapy for metastatic colon cancer.  Patient subsequently underwent ultrasound-guided liver lesion biopsy on 10/27/2017 of the more peripheral lesion with the right lobe of the liver confirming metastatic colon cancer.  The patient was then seen in repeat consultation on 11/07/2017 and the decision was made to pursue image guided microwave ablation of both liver lesions which was subsequently performed on 11/22/2017.  Surveillance PET/CT performed 02/15/2018 demonstrated residual/recurrent hypermetabolic activity within the lesion within the central aspect of the caudate lobe without definitive activity associated with the ablated lesion with the peripheral aspect of the right lobe of the liver.  This finding is associated with significant reduction in the patient's CEA level which was 36 on 08/30/2017, reduced to 3.2 on February 17.  Given the COVID-19 pandemic the decision was made to continue with continued surveillance and dedicated intervention was not performed at that time.  Unfortunately, the patient's CEA level has risen since February, found to be 12.4 on April 30 and 18 on 5/27, which has prompted the acquisition of an abdominal MRI which was performed on 06/15/2018.  Additionally, I ordered a surveillance CT scan of the chest abdomen and pelvis which was performed on 06/18/2018.  A telemedicine appointment has been scheduled for today to discuss the results of the surveillance examinations as well as to formulate a new plan of care. The patient is  accompanied on the phone call by his wife.  The patient continues to report a state of well health.  Specifically, no change in appetite or energy level.  No unintentional weight loss or weight gain.  He remains interested in pursuing all available treatment options.   Past Medical History:  Diagnosis Date   Anticoagulated on Coumadin    chronic long term   Antineoplastic chemotherapy induced anemia    Chronic atrial fibrillation    cardiologist--  dr Curly Rim [facility] in [place])--- hx DVVC in 2006/  TEE 10-21-2015 (care everywhere)  ef 55-60%, moderate dilated RA, mild MR, RVSP 59mmHg   CKD (chronic kidney disease), stage II    Colon cancer metastasized to liver [facility])    current oncologist-- dr Marin Olp (previous oncologist at [facility])--- dx 12/ 2017,  Stage IIb ([id number]),  s/p  resection colon tumor 01-01-2016 ,  recurrent w/ liver mets via bx 08/ 2018  , had chemo then partial hepatectomy and completed chemo after surgery/ liver mets recurrence 08/ 2019   Cushing's disease ([place])    endocriniologist-- dr Elisabeth Most [facility])   Droopy eyelid, right    11-15-2017   GERD (gastroesophageal reflux disease)    Goals of care, counseling/discussion 08/30/2017   Gout    11-15-2017 per pt last flare-up 2018   History of benign pituitary tumor    resection 07-25-2013   History of cancer chemotherapy    COMPLETED 02-20-2017  FOR  COLON CANCER WITH LIVER METS   History of radiation therapy    03/ 2016  remainder of pituitary tumor   Hypertensive cardiovascular disease    Hypothyroidism, secondary    OA (osteoarthritis)    Secondary adrenal insufficiency ([place])    Secondary male hypogonadism    Wears glasses  Wears partial dentures    upper and lower    Past Surgical History:  Procedure Laterality Date   COLOSTOMY TAKEDOWN  05-16-2016   @[facility]    IR RADIOLOGIST EVAL & MGMT  10/10/2017   IR RADIOLOGIST EVAL & MGMT  11/07/2017   IR RADIOLOGIST EVAL &  MGMT  12/21/2017   OPEN PARTIAL HEPATECTOMY   01-13-2017    @[facility]    AND CHOLECYSTECTOMY   RADIOFREQUENCY ABLATION N/A 11/22/2017   Procedure: CT MICROWAVE ABLATION LIVER;  Surgeon: Sandi Mariscal, MD;  Location: [facility] ORS;  Service: Anesthesiology;  Laterality: N/A;   REVISION TOTAL KNEE ARTHROPLASTY Left 12/20/2013   SUBTOTAL COLECTOMY  01-01-2016  @[facility]    W/  CREATION ILEOSTOMY AND UMBILICAL HERNIA REPAIR   TOTAL KNEE ARTHROPLASTY Bilateral left 11-16-2010;  right 10-26-2011   TRANSPHENOIDAL PITUITARY RESECTION  07-25-2013   @[facility]    Allergies: Patient has no known allergies.  Medications: Prior to Admission medications   Medication Sig Start Date End Date Taking? Authorizing Provider  allopurinol (ZYLOPRIM) 300 MG tablet Take 300 mg by mouth every morning.  01/28/10   [provider]  atorvastatin (LIPITOR) 40 MG tablet Take 40 mg by mouth daily. 03/22/18   [provider]  carvedilol (COREG) 3.125 MG tablet Take 6.25 mg by mouth 2 (two) times daily.  04/24/18   [provider]  [employer] ASPIRIN ADULT LOW DOSE 81 MG chewable tablet Chew 81 mg by mouth daily. 02/07/18   [provider]  [employer] STOOL SOFTENER/LAXATIVE 8.6-50 MG tablet TAKE 2 TABLETS EVERY EVENING 03/22/18   [provider]  ferrous sulfate 325 (65 FE) MG tablet Take 325 mg by mouth daily.    [provider]  furosemide (LASIX) 20 MG tablet Take 20 mg by mouth daily.     [provider]  hydrocortisone (CORTEF) 10 MG tablet Take 5-15 mg by mouth See admin instructions. Takes 15 mg in am and 5mg  in afternoon. 08/19/17   [provider]  levothyroxine (SYNTHROID, LEVOTHROID) 88 MCG tablet Take 88 mcg by mouth daily before breakfast.  05/15/17   [provider]  loperamide (IMODIUM) 2 MG capsule Take 2 mg by mouth as needed for diarrhea or loose stools.     [provider]  NASAL [facility] NA Place 1 spray into the nose daily.     [provider]  pantoprazole (PROTONIX) 40 MG tablet Take 40 mg by mouth every morning.  05/16/14   [provider]  polyvinyl alcohol (LIQUIFILM TEARS) 1.4 % ophthalmic solution Place 1 drop into both eyes daily as needed for dry eyes.     [provider]  potassium chloride (KLOR-CON 10) 10 MEQ tablet Take 20 mEq by mouth daily. 11/17/14   [provider]  prochlorperazine (COMPAZINE) 10 MG tablet Take 10 mg by mouth every 8 (eight) hours as needed for nausea or vomiting.  09/01/16   [provider]  testosterone (ANDRODERM) 4 MG/24HR PT24 patch Place 1 patch onto the skin daily.  04/17/15   [provider]  warfarin (COUMADIN) 5 MG tablet Take 5 mg by mouth daily. 07/05/17   [provider]     No family history on file.  Social History   Socioeconomic History   Marital status: Married    Spouse name: Not on file   Number of children: Not on file   Years of education: Not on file   Highest education level: Not on file  Occupational History  Not on file  Social Needs   Financial resource strain: Not on file   Food insecurity    Worry: Not on file    Inability: Not on file   Transportation needs    Medical: Not on file    Non-medical: Not on file  Tobacco Use   Smoking status: Never Smoker   Smokeless tobacco: Never Used  Substance and Sexual Activity   Alcohol use: Not Currently   Drug use: Never   Sexual activity: Not on file  Lifestyle   Physical activity    Days per week: Not on file    Minutes per session: Not on file   Stress: Not on file  Relationships   Social connections    Talks on phone: Not on file    Gets together: Not on file    Attends religious service: Not on file    Active member of club or organization: Not on file    Attends meetings of clubs or organizations: Not on file    Relationship status: Not on file  Other Topics Concern   Not on file  Social History Narrative   Not on file     ECOG Status: 1 - Symptomatic but completely ambulatory  Review of Systems  Constitutional: Negative for activity change and appetite change.  Respiratory: Negative for shortness of breath.   Gastrointestinal: Negative for abdominal distention and abdominal pain.    Review of Systems: A 12 point ROS discussed and pertinent positives are indicated in the HPI above.  All other systems are negative.  Physical Exam No direct physical exam was performed (except for noted visual exam findings with Video Visits).   Vital Signs: There were no vitals taken for this visit.  Imaging:  Ct Abdomen Pelvis W Wo Contrast  Result Date: 06/18/2018 CLINICAL DATA:  Metastatic lung cancer, status post radiofrequency ablation to the liver. Rising CEA. EXAM: CT CHEST WITH CONTRAST CT ABDOMEN AND PELVIS WITH AND WITHOUT CONTRAST TECHNIQUE: Multidetector CT imaging of the chest was performed during intravenous contrast administration. Multidetector CT imaging of the abdomen and pelvis was performed following the standard protocol before and during bolus administration of intravenous contrast. CONTRAST:  139mL OMNIPAQUE IOHEXOL 300 MG/ML  SOLN COMPARISON:  MR abdomen 06/15/2018 and PET 02/15/2018. FINDINGS: CT CHEST FINDINGS Cardiovascular: Right IJ Port-A-Cath terminates in the SVC. Atherosclerotic calcification of the aorta. Heart is enlarged. No pericardial effusion. Mediastinum/Nodes: Left lobe of the thyroid is heterogeneous and enlarged, as before. No pathologically enlarged mediastinal, hilar or axillary lymph nodes. Esophagus is grossly unremarkable. Lungs/Pleura: Smudgy subpleural lymph node along the minor fissure. Additional millimetric pulmonary nodules stable. No pleural fluid. Airway is unremarkable. Musculoskeletal: Degenerative changes in the spine. No worrisome lytic or sclerotic lesions. Median sternotomy. CT ABDOMEN AND PELVIS FINDINGS Hepatobiliary: A lobulated hypoattenuating lesion in the  peripheral right hepatic lobe measures 3.1 x 3.4 cm (series 11, image 102), as on 06/15/2018. Ill-defined low-attenuation lesion in the caudate measures approximately 2.3 x 3.5 cm, similar to 06/15/2018. Enhancement characteristics were better seen on 06/15/2018. Cholecystectomy. No biliary ductal dilatation. Pancreas: Negative. Spleen: Measures 15.6 cm. Adrenals/Urinary Tract: Adrenal glands and right kidney are unremarkable. Nonenhancing simple cyst in the interpolar left kidney measures 6.9 cm. Subcentimeter low-attenuation lesion in the medial interpolar left kidney (series 16, image 56), too small to characterize. Ureters are decompressed. Bladder is low in volume. Stomach/Bowel: Stomach and small bowel are unremarkable. Right hemicolectomy. Colon is otherwise unremarkable. Vascular/Lymphatic: Atherosclerotic calcification of the aorta without  aneurysm. No pathologically enlarged lymph nodes. Reproductive: Prostate is mildly enlarged. Other: No free fluid. Mesenteries and peritoneum are unremarkable. There are tiny incisional hernias containing fat along the ventral midline. Musculoskeletal: Degenerative changes in the spine. No worrisome lytic or sclerotic lesions. IMPRESSION: 1. Post ablation defect in the right hepatic lobe and additional lesion in the caudate lobe, better evaluated on 06/15/2018. No additional evidence of metastatic disease in the chest, abdomen or pelvis. 2. Splenomegaly. 3. Mildly enlarged prostate. 4.  Aortic atherosclerosis (ICD10-170.0). Electronically Signed   By: Lorin Picket M.D.   On: 06/18/2018 09:36   Ct Chest W Contrast  Result Date: 06/18/2018 CLINICAL DATA:  Metastatic lung cancer, status post radiofrequency ablation to the liver. Rising CEA. EXAM: CT CHEST WITH CONTRAST CT ABDOMEN AND PELVIS WITH AND WITHOUT CONTRAST TECHNIQUE: Multidetector CT imaging of the chest was performed during intravenous contrast administration. Multidetector CT imaging of the abdomen and  pelvis was performed following the standard protocol before and during bolus administration of intravenous contrast. CONTRAST:  120mL OMNIPAQUE IOHEXOL 300 MG/ML  SOLN COMPARISON:  MR abdomen 06/15/2018 and PET 02/15/2018. FINDINGS: CT CHEST FINDINGS Cardiovascular: Right IJ Port-A-Cath terminates in the SVC. Atherosclerotic calcification of the aorta. Heart is enlarged. No pericardial effusion. Mediastinum/Nodes: Left lobe of the thyroid is heterogeneous and enlarged, as before. No pathologically enlarged mediastinal, hilar or axillary lymph nodes. Esophagus is grossly unremarkable. Lungs/Pleura: Smudgy subpleural lymph node along the minor fissure. Additional millimetric pulmonary nodules stable. No pleural fluid. Airway is unremarkable. Musculoskeletal: Degenerative changes in the spine. No worrisome lytic or sclerotic lesions. Median sternotomy. CT ABDOMEN AND PELVIS FINDINGS Hepatobiliary: A lobulated hypoattenuating lesion in the peripheral right hepatic lobe measures 3.1 x 3.4 cm (series 11, image 102), as on 06/15/2018. Ill-defined low-attenuation lesion in the caudate measures approximately 2.3 x 3.5 cm, similar to 06/15/2018. Enhancement characteristics were better seen on 06/15/2018. Cholecystectomy. No biliary ductal dilatation. Pancreas: Negative. Spleen: Measures 15.6 cm. Adrenals/Urinary Tract: Adrenal glands and right kidney are unremarkable. Nonenhancing simple cyst in the interpolar left kidney measures 6.9 cm. Subcentimeter low-attenuation lesion in the medial interpolar left kidney (series 16, image 56), too small to characterize. Ureters are decompressed. Bladder is low in volume. Stomach/Bowel: Stomach and small bowel are unremarkable. Right hemicolectomy. Colon is otherwise unremarkable. Vascular/Lymphatic: Atherosclerotic calcification of the aorta without aneurysm. No pathologically enlarged lymph nodes. Reproductive: Prostate is mildly enlarged. Other: No free fluid. Mesenteries and  peritoneum are unremarkable. There are tiny incisional hernias containing fat along the ventral midline. Musculoskeletal: Degenerative changes in the spine. No worrisome lytic or sclerotic lesions. IMPRESSION: 1. Post ablation defect in the right hepatic lobe and additional lesion in the caudate lobe, better evaluated on 06/15/2018. No additional evidence of metastatic disease in the chest, abdomen or pelvis. 2. Splenomegaly. 3. Mildly enlarged prostate. 4.  Aortic atherosclerosis (ICD10-170.0). Electronically Signed   By: Lorin Picket M.D.   On: 06/18/2018 09:36   Mr Liver W Wo Contrast  Result Date: 06/15/2018 CLINICAL DATA:  73 year old male with history of colon cancer. Evaluate for liver metastasis. EXAM: MRI ABDOMEN WITHOUT AND WITH CONTRAST TECHNIQUE: Multiplanar multisequence MR imaging of the abdomen was performed both before and after the administration of intravenous contrast. CONTRAST:  62mL MULTIHANCE GADOBENATE DIMEGLUMINE 529 MG/ML IV SOLN COMPARISON:  MRI of the abdomen 10/17/2017. FINDINGS: Lower chest: Signal void in the sternum presumably from median sternotomy wires. Trace bilateral pleural effusions lying dependently. Hepatobiliary: Previously treated lesions in the caudate lobe and periphery of  the right lobe of the liver (centered predominantly in segments 6 and 7) are again noted. On today's examination there are postprocedural changes of prior microwave ablation, such that the treated lesions now appear larger and are heterogeneous in signal intensity and enhancement characteristics. The lesion in the caudate lobe is predominantly T1 hypointense and mildly T2 hyperintense, appearing generally hypovascular on post gadolinium imaging with some progressive delayed enhancement internally, including what appear to be several internal septations and some central nodular soft tissue, overall measuring 2.3 x 3.3 x 3.3 cm (axial image 22 of series 18 and coronal image 40 of series 17). The  other lesion in segments 6/7 now has a bilobed appearance and is predominantly T1 hypointense with some dependent T1 hyperintensity, heterogeneous in T2 signal intensity with both T2 hypointensity and mild T2 hyperintensity, and is also generally hypovascular in appearance, with less significant internal delayed enhancement, overall measuring 3.1 x 2.9 x 1.9 cm (axial image 29 of series 18 and coronal image 27 of series 17). This lesion in segment 6/7 does demonstrate some early enhancement along the medial margin where the smaller lobe of the lesion measures approximately 1.4 cm (axial image 32 of series 13). Pancreas: 8 mm T1 hypointense, T2 hyperintense, nonenhancing lesion in the body of the pancreas (axial image 11 of series 9), stable compared to the prior examination. No new pancreatic mass. No pancreatic ductal dilatation. No pancreatic or peripancreatic fluid or inflammatory changes. Spleen: Spleen is enlarged measuring 15.9 x 8.0 x 14.8 cm (estimated splenic volume of 941 mL) . Adrenals/Urinary Tract: In the interpolar region of the left kidney there is a 6.6 cm T1 hypointense, T2 hyperintense, nonenhancing lesion, compatible with a simple cyst. 5 mm simple cyst in the interpolar region of the right kidney also noted. Bilateral adrenal glands are normal in appearance. No hydroureteronephrosis in the visualized portions of the abdomen. Stomach/Bowel: Visualized portions are unremarkable. Vascular/Lymphatic: No aneurysm identified in the visualized abdominal vasculature. No lymphadenopathy noted in the visualized abdomen. Other: No significant volume of ascites noted in the visualized portions of the peritoneal cavity. Musculoskeletal: No aggressive appearing osseous lesions are noted in the visualized portions of the skeleton. IMPRESSION: 1. Post treatment related changes of prior microwave ablation for the lesion in the caudate lobe and the lesion in the lateral aspect of the right lobe of the liver  (segment 6 and 7). The caudate lobe lesion is notably larger with evidence of internal septations and some internal nodularity, with progressive enhancement, concerning for residual/progressive disease. The lesion in the lateral aspect of segment 6/7 has a more typical appearance of a post ablation defect, although there is some enhancement along the medial aspect of the lesion which warrants close attention on follow-up studies. 2. Splenomegaly. 3. Trace bilateral pleural effusions. 4. Additional incidental findings, as above. Electronically Signed   By: Vinnie Langton M.D.   On: 06/15/2018 09:00    Labs:  CBC: Recent Labs    12/28/17 0802 02/19/18 1153 05/03/18 0744 05/30/18 0930  WBC 4.5 5.9 7.2 6.7  HGB 13.1 12.9* 13.2 13.4  HCT 40.0 39.4 41.1 41.3  PLT 117* 170 152 145*    COAGS: Recent Labs    10/27/17 0651 11/22/17 0746  INR 1.51 1.07    BMP: Recent Labs    12/28/17 0802 02/19/18 1153 05/03/18 0744 05/30/18 0930  NA 142 142 142 142  K 3.7 3.4* 4.4 4.5  CL 105 106 105 105  CO2 31 28 30  31  GLUCOSE 110* 136* 112* 130*  BUN 15 13 23 23   CALCIUM 8.9 8.9 9.5 9.3  CREATININE 1.33* 1.29* 1.36* 1.26*  GFRNONAA 54* 56* 52* 57*  GFRAA >60 >60 >60 >60    LIVER FUNCTION TESTS: Recent Labs    12/28/17 0802 02/19/18 1153 05/03/18 0744 05/30/18 0930  BILITOT 1.0 0.7 0.7 0.7  AST 16 17 22 19   ALT 9 10 19 15   ALKPHOS 95 98 93 90  PROT 6.5 6.2* 6.8 7.0  ALBUMIN 3.6 3.8 3.9 4.0    TUMOR MARKERS: CEA trend:  08/30/2017 - 36 (preprocedural) 02/19/2018 - 3.2 (postprocedural) 05/03/2018 - 12.4 05/30/2018 - 18  Assessment and Plan:  Zachary Willis is a 73 y.o. male with past medical history significant for atrial fibrillation (chronic Coumadin), pituitary tumor (post resection) with chronic steroid supplementation, hypertension and hypothyroidism who underwent percutaneous microwave ablation of 2 liver lesions on 11/22/2017.  While CT scan of the chest, abdomen and  pelvis performed 6-15/2020 was negative for definitive evidence of extrahepatic metastatic disease, abdominal MRI performed 06/14/2017 demonstrates enlargement of the ill-defined infiltrative mass within the caudate lobe currently measuring approximately 3.3 x 3.3 x 2.3 cm, with new additional worrisome peripheral enhancing lesion about the medial aspect of the ablation zone within the right lobe of the liver measuring approximately 1.4 cm.  These findings are also associated with continued elevation of the patient's CEA level which was reduced to 3.2 on postprocedural lab value obtained February 17, found to be 12.4 on April 30 and 18 on 5/27.  Given above, I feel that additional liver directed therapy could be considered at the discretion of Dr. Marin Olp.  Unfortunately, given the size and infiltrative appearance of the dominant lesion within the caudate lobe, I do NOT feel the patient is a candidate for attempted repeat microwave ablation given my concern for incomplete ablation as well as injury to adjacent vasculature and or the central biliary system.  As such, given patient's normal LFTs, prolonged conversations were held with the patient and the patient's wife regarding the benefits and risks (including but not limited to nontarget embolization, hepatic dysfunction, pulmonary shunting, contrast and radiation exposure) of Y 90 radioembolization.  I explained that transcatheter treatments are performed for palliative purposes though I would hope we would could achieve disease improvement/stability for several months pending his response.  Following this prolonged a detailed conversation, the patient and the patient's wife wished to pursue Y 1 radial embolization following their scheduled consultation with Dr. Marin Olp for later next week.  Note, Y 90 radial embolization can be performed in conjunction with initiation of systemic therapy as deemed appropriate by Dr. Marin Olp.  Once insurance  authorization is obtained, the radioembolization will be performed at [facility] on an outpatient basis.  The procedure will entail 3 separate procedures, an initial mapping/planning procedure as well as likely two subsequent definitive Y 90 radioembolizations, the first of which will be directed at the dominant lesion within the caudate.  Note, a repeat CEA level will be obtained immediately prior to the initial radioembolization.  Subsequent surveillance PET/CT will be obtained approximately 3 months following the final radioembolization session.  The patient and patient's wife demonstrated excellent understanding of the above conversation and agreement with the plan of care. They know to call the interventional radiology clinic with any interval questions or concerns.  A copy of this report was sent to the requesting provider on this date.  Electronically Signed: Sandi Mariscal 06/19/2018, 1:45 PM  I spent a total of 25 Minutes in remote  clinical consultation, greater than 50% of which was counseling/coordinating care for metastatic colon cancer.    Visit type: Audio only (telephone). Audio (no video) only due to patient's lack of internet/smartphone capability. Alternative for in-person consultation at [facility], [place] [address] [place], [place], [place]. This visit type was conducted due to national recommendations for restrictions regarding the COVID-19 Pandemic (e.g. social distancing).  This format is felt to be most appropriate for this patient at this time.  All issues noted in this document were discussed and addressed.

## 2018-06-21 ENCOUNTER — Other Ambulatory Visit (HOSPITAL_COMMUNITY): Payer: Self-pay | Admitting: Interventional Radiology

## 2018-06-21 DIAGNOSIS — C189 Malignant neoplasm of colon, unspecified: Secondary | ICD-10-CM

## 2018-06-21 DIAGNOSIS — C787 Secondary malignant neoplasm of liver and intrahepatic bile duct: Secondary | ICD-10-CM

## 2018-06-29 ENCOUNTER — Encounter: Payer: Self-pay | Admitting: Hematology & Oncology

## 2018-06-29 ENCOUNTER — Inpatient Hospital Stay: Payer: Medicare HMO | Attending: Hematology & Oncology | Admitting: Hematology & Oncology

## 2018-06-29 ENCOUNTER — Inpatient Hospital Stay: Payer: Medicare HMO

## 2018-06-29 ENCOUNTER — Other Ambulatory Visit: Payer: Self-pay

## 2018-06-29 ENCOUNTER — Telehealth: Payer: Self-pay | Admitting: Hematology & Oncology

## 2018-06-29 VITALS — BP 149/78 | HR 52 | Temp 98.4°F | Resp 20 | Wt 239.1 lb

## 2018-06-29 DIAGNOSIS — Z7982 Long term (current) use of aspirin: Secondary | ICD-10-CM

## 2018-06-29 DIAGNOSIS — Z79899 Other long term (current) drug therapy: Secondary | ICD-10-CM | POA: Diagnosis not present

## 2018-06-29 DIAGNOSIS — C787 Secondary malignant neoplasm of liver and intrahepatic bile duct: Secondary | ICD-10-CM | POA: Diagnosis not present

## 2018-06-29 DIAGNOSIS — C189 Malignant neoplasm of colon, unspecified: Secondary | ICD-10-CM

## 2018-06-29 DIAGNOSIS — Z7901 Long term (current) use of anticoagulants: Secondary | ICD-10-CM

## 2018-06-29 LAB — CMP (CANCER CENTER ONLY)
ALT: 14 U/L (ref 0–44)
AST: 20 U/L (ref 15–41)
Albumin: 3.7 g/dL (ref 3.5–5.0)
Alkaline Phosphatase: 84 U/L (ref 38–126)
Anion gap: 7 (ref 5–15)
BUN: 26 mg/dL — ABNORMAL HIGH (ref 8–23)
CO2: 27 mmol/L (ref 22–32)
Calcium: 9.5 mg/dL (ref 8.9–10.3)
Chloride: 107 mmol/L (ref 98–111)
Creatinine: 1.25 mg/dL — ABNORMAL HIGH (ref 0.61–1.24)
GFR, Est AFR Am: >60 mL/min (ref >60)
GFR, Estimated: 58 mL/min — ABNORMAL LOW (ref >60)
Glucose, Bld: 112 mg/dL — ABNORMAL HIGH (ref 70–99)
Potassium: 3.8 mmol/L (ref 3.5–5.1)
Sodium: 141 mmol/L (ref 135–145)
Total Bilirubin: 0.8 mg/dL (ref 0.3–1.2)
Total Protein: 6.4 g/dL — ABNORMAL LOW (ref 6.5–8.1)

## 2018-06-29 LAB — CBC WITH DIFFERENTIAL (CANCER CENTER ONLY)
Abs Immature Granulocytes: 0.02 10*3/uL (ref 0.00–0.07)
Basophils Absolute: 0.1 10*3/uL (ref 0.0–0.1)
Basophils Relative: 1 %
Eosinophils Absolute: 0.3 10*3/uL (ref 0.0–0.5)
Eosinophils Relative: 4 %
HCT: 39.4 % (ref 39.0–52.0)
Hemoglobin: 12.6 g/dL — ABNORMAL LOW (ref 13.0–17.0)
Immature Granulocytes: 0 %
Lymphocytes Relative: 29 %
Lymphs Abs: 1.8 10*3/uL (ref 0.7–4.0)
MCH: 28 pg (ref 26.0–34.0)
MCHC: 32 g/dL (ref 30.0–36.0)
MCV: 87.6 fL (ref 80.0–100.0)
Monocytes Absolute: 0.5 10*3/uL (ref 0.1–1.0)
Monocytes Relative: 8 %
Neutro Abs: 3.5 10*3/uL (ref 1.7–7.7)
Neutrophils Relative %: 58 %
Platelet Count: 135 10*3/uL — ABNORMAL LOW (ref 150–400)
RBC: 4.5 MIL/uL (ref 4.22–5.81)
RDW: 15 % (ref 11.5–15.5)
WBC Count: 6.2 10*3/uL (ref 4.0–10.5)
nRBC: 0 % (ref 0.0–0.2)

## 2018-06-29 LAB — CEA (IN HOUSE-CHCC): CEA (CHCC-In House): 25.42 ng/mL — ABNORMAL HIGH (ref 0.00–5.00)

## 2018-06-29 MED ORDER — HEPARIN SOD (PORK) LOCK FLUSH 100 UNIT/ML IV SOLN
500.0000 [IU] | Freq: Once | INTRAVENOUS | Status: AC
  Administered 2018-06-29: 500 [IU] via INTRAVENOUS
  Filled 2018-06-29: qty 5

## 2018-06-29 MED ORDER — SODIUM CHLORIDE 0.9% FLUSH
10.0000 mL | Freq: Once | INTRAVENOUS | Status: AC
  Administered 2018-06-29: 10 mL
  Filled 2018-06-29: qty 10

## 2018-06-29 NOTE — Progress Notes (Signed)
Hematology and Oncology Follow Up Visit  Zachary Willis [id number] 04-Nov-1945 73 y.o. 06/29/2018   Principle Diagnosis:   Metastatic colon cancer  Current Therapy:    Status post RFA to the liver-11/21/2017      Interim History:  Zachary Willis is back for follow-up.  He has been seen by interventional radiology.  They will start the Y-90 protocol on him right after July 4 holiday.  He will have mapping on July 7.  He will then have a 2 treatments of Y-90.  Of note, he did have an MRI that was done on 06/15/2018.  The MRI showed that there is a central liver lesion measuring 2.3 x 3.3 x 3.3 cm.  Another lesion measured 3.1 x 2.9 x 1.9 cm.  There is a third lesion that measures 1.4 cm.  He is not having any pain.  His last CEA level was 18.  He feels well.  He has had no cardiac issues.  He is doing cardiac rehab.  This is helping.  He has had no bleeding.  He is on Coumadin.  Cardiology is managing this for him.  His appetite is good.  Has had no nausea or vomiting.  He has had no increase in fatigue.  In fact, his stamina is a lot better.  Overall, his performance status is ECOG 1.    Medications:  Current Outpatient Medications:  .  allopurinol (ZYLOPRIM) 300 MG tablet, Take 300 mg by mouth every morning. , Disp: , Rfl:  .  atorvastatin (LIPITOR) 40 MG tablet, Take 40 mg by mouth daily., Disp: , Rfl:  .  carvedilol (COREG) 6.25 MG tablet, TAKE 1 TABLET (6.25 MG TOTAL) BY MOUTH 2 TIMES DAILY WITH MEALS., Disp: , Rfl:  .  [employer] ASPIRIN ADULT LOW DOSE 81 MG chewable tablet, Chew 81 mg by mouth daily., Disp: , Rfl:  .  [employer] STOOL SOFTENER/LAXATIVE 8.6-50 MG tablet, TAKE 2 TABLETS EVERY EVENING, Disp: , Rfl:  .  ferrous sulfate 325 (65 FE) MG tablet, Take 325 mg by mouth daily., Disp: , Rfl:  .  furosemide (LASIX) 20 MG tablet, Take 20 mg by mouth daily. , Disp: , Rfl:  .  hydrocortisone (CORTEF) 10 MG tablet, Take 5-15 mg by mouth See admin instructions. Takes 15 mg in am and 5mg  in  afternoon., Disp: , Rfl: 4 .  levothyroxine (SYNTHROID, LEVOTHROID) 88 MCG tablet, Take 88 mcg by mouth daily before breakfast. , Disp: , Rfl:  .  loperamide (IMODIUM) 2 MG capsule, Take 2 mg by mouth as needed for diarrhea or loose stools. , Disp: , Rfl:  .  NASAL WASH NA, Place 1 spray into the nose daily. , Disp: , Rfl:  .  pantoprazole (PROTONIX) 40 MG tablet, Take 40 mg by mouth every morning. , Disp: , Rfl:  .  polyvinyl alcohol (LIQUIFILM TEARS) 1.4 % ophthalmic solution, Place 1 drop into both eyes daily as needed for dry eyes. , Disp: , Rfl:  .  potassium chloride (KLOR-CON 10) 10 MEQ tablet, Take 20 mEq by mouth daily., Disp: , Rfl:  .  testosterone (ANDRODERM) 4 MG/24HR PT24 patch, Place 1 patch onto the skin daily. , Disp: , Rfl:  .  warfarin (COUMADIN) 5 MG tablet, Take 5 mg by mouth daily., Disp: , Rfl: 0 .  carvedilol (COREG) 3.125 MG tablet, Take 6.25 mg by mouth 2 (two) times daily. , Disp: , Rfl:  .  prochlorperazine (COMPAZINE) 10 MG tablet, Take 10 mg by  mouth every 8 (eight) hours as needed for nausea or vomiting. , Disp: , Rfl:   Allergies: No Known Allergies  Past Medical History, Surgical history, Social history, and Family History were reviewed and updated.  Review of Systems: Review of Systems  Constitutional: Negative.   HENT:  Negative.   Eyes: Negative.   Respiratory: Negative.   Cardiovascular: Negative.   Gastrointestinal: Negative.   Endocrine: Negative.   Genitourinary: Negative.    Musculoskeletal: Negative.   Skin: Negative.   Neurological: Negative.   Hematological: Negative.   Psychiatric/Behavioral: Negative.     Physical Exam:  weight is 239 lb 1.9 oz (108.5 kg). His oral temperature is 98.4 F (36.9 C). His blood pressure is 149/78 (abnormal) and his pulse is 52 (abnormal). His respiration is 20 and oxygen saturation is 99%.   Wt Readings from Last 3 Encounters:  06/29/18 239 lb 1.9 oz (108.5 kg)  05/30/18 243 lb 4.8 oz (110.4 kg)   05/03/18 243 lb (110.2 kg)    Physical Exam Vitals signs reviewed.  HENT:     Head: Normocephalic and atraumatic.  Eyes:     Pupils: Pupils are equal, round, and reactive to light.  Neck:     Musculoskeletal: Normal range of motion.  Cardiovascular:     Rate and Rhythm: Normal rate and regular rhythm.     Heart sounds: Normal heart sounds.     Comments: Regular rate and rhythm with an occasional extra beat. Pulmonary:     Effort: Pulmonary effort is normal.     Breath sounds: Normal breath sounds.  Abdominal:     General: Bowel sounds are normal.     Palpations: Abdomen is soft.     Comments: Abdomen is soft.  He has multiple laparotomy scars.  There is no fluid wave.  There is no palpable liver or spleen tip.  Musculoskeletal: Normal range of motion.        General: No tenderness or deformity.  Lymphadenopathy:     Cervical: No cervical adenopathy.  Skin:    General: Skin is warm and dry.     Findings: No erythema or rash.  Neurological:     Mental Status: He is alert and oriented to person, place, and time.  Psychiatric:        Behavior: Behavior normal.        Thought Content: Thought content normal.        Judgment: Judgment normal.      Lab Results  Component Value Date   WBC 6.2 06/29/2018   HGB 12.6 (L) 06/29/2018   HCT 39.4 06/29/2018   MCV 87.6 06/29/2018   PLT 135 (L) 06/29/2018     Chemistry      Component Value Date/Time   NA 141 06/29/2018 0935   K 3.8 06/29/2018 0935   CL 107 06/29/2018 0935   CO2 27 06/29/2018 0935   BUN 26 (H) 06/29/2018 0935   CREATININE 1.25 (H) 06/29/2018 0935      Component Value Date/Time   CALCIUM 9.5 06/29/2018 0935   ALKPHOS 84 06/29/2018 0935   AST 20 06/29/2018 0935   ALT 14 06/29/2018 0935   BILITOT 0.8 06/29/2018 0935       Impression and Plan: Zachary Willis is a 73 year old white male with metastatic colon cancer.  I am so impressed with radiology.  They will start the Y- 90 protocol in a couple weeks.  It  sounds like he will have 2 treatments.  I think this will  be wonderful.  I feel confident that this will work.  We will now plan to see him back after his second treatment with Y-90.  We probably will see him back after Labor Day holiday.  Hopefully we will see that his CEA is better.      [name], MD 6/26/202010:53 AM

## 2018-06-29 NOTE — Telephone Encounter (Signed)
lmom for appt 9/11 at 0900

## 2018-06-29 NOTE — Patient Instructions (Signed)

## 2018-07-16 ENCOUNTER — Other Ambulatory Visit: Payer: Self-pay | Admitting: Student

## 2018-07-17 ENCOUNTER — Other Ambulatory Visit (HOSPITAL_COMMUNITY): Payer: Self-pay | Admitting: Interventional Radiology

## 2018-07-17 ENCOUNTER — Encounter (HOSPITAL_COMMUNITY): Payer: Self-pay | Admitting: Interventional Radiology

## 2018-07-17 ENCOUNTER — Other Ambulatory Visit: Payer: Self-pay

## 2018-07-17 ENCOUNTER — Encounter (HOSPITAL_COMMUNITY)
Admission: RE | Admit: 2018-07-17 | Discharge: 2018-07-17 | Disposition: A | Discharge status: Home / Self Care | Payer: Medicare HMO | Source: Ambulatory Visit | Attending: Interventional Radiology | Admitting: Interventional Radiology

## 2018-07-17 ENCOUNTER — Ambulatory Visit (HOSPITAL_COMMUNITY)
Admission: RE | Admit: 2018-07-17 | Discharge: 2018-07-17 | Disposition: A | Discharge status: Home / Self Care | Payer: Medicare HMO | Source: Ambulatory Visit | Attending: Family Medicine | Admitting: Family Medicine

## 2018-07-17 ENCOUNTER — Ambulatory Visit (HOSPITAL_COMMUNITY)
Admission: RE | Admit: 2018-07-17 | Discharge: 2018-07-17 | Disposition: A | Discharge status: Home / Self Care | Payer: Medicare HMO | Source: Ambulatory Visit | Attending: Interventional Radiology | Admitting: Interventional Radiology

## 2018-07-17 DIAGNOSIS — C787 Secondary malignant neoplasm of liver and intrahepatic bile duct: Secondary | ICD-10-CM

## 2018-07-17 DIAGNOSIS — E039 Hypothyroidism, unspecified: Secondary | ICD-10-CM | POA: Diagnosis not present

## 2018-07-17 DIAGNOSIS — C189 Malignant neoplasm of colon, unspecified: Secondary | ICD-10-CM

## 2018-07-17 DIAGNOSIS — Z9221 Personal history of antineoplastic chemotherapy: Secondary | ICD-10-CM | POA: Insufficient documentation

## 2018-07-17 DIAGNOSIS — N182 Chronic kidney disease, stage 2 (mild): Secondary | ICD-10-CM | POA: Diagnosis not present

## 2018-07-17 DIAGNOSIS — M1990 Unspecified osteoarthritis, unspecified site: Secondary | ICD-10-CM | POA: Insufficient documentation

## 2018-07-17 DIAGNOSIS — E291 Testicular hypofunction: Secondary | ICD-10-CM | POA: Insufficient documentation

## 2018-07-17 DIAGNOSIS — I482 Chronic atrial fibrillation, unspecified: Secondary | ICD-10-CM | POA: Diagnosis not present

## 2018-07-17 DIAGNOSIS — E249 Cushing's syndrome, unspecified: Secondary | ICD-10-CM | POA: Insufficient documentation

## 2018-07-17 DIAGNOSIS — I129 Hypertensive chronic kidney disease with stage 1 through stage 4 chronic kidney disease, or unspecified chronic kidney disease: Secondary | ICD-10-CM | POA: Diagnosis not present

## 2018-07-17 DIAGNOSIS — Z7901 Long term (current) use of anticoagulants: Secondary | ICD-10-CM | POA: Diagnosis not present

## 2018-07-17 DIAGNOSIS — Z7982 Long term (current) use of aspirin: Secondary | ICD-10-CM | POA: Diagnosis not present

## 2018-07-17 DIAGNOSIS — E2740 Unspecified adrenocortical insufficiency: Secondary | ICD-10-CM | POA: Insufficient documentation

## 2018-07-17 DIAGNOSIS — Z79899 Other long term (current) drug therapy: Secondary | ICD-10-CM | POA: Insufficient documentation

## 2018-07-17 DIAGNOSIS — Z79890 Hormone replacement therapy (postmenopausal): Secondary | ICD-10-CM | POA: Diagnosis not present

## 2018-07-17 DIAGNOSIS — K219 Gastro-esophageal reflux disease without esophagitis: Secondary | ICD-10-CM | POA: Diagnosis not present

## 2018-07-17 DIAGNOSIS — M109 Gout, unspecified: Secondary | ICD-10-CM | POA: Diagnosis not present

## 2018-07-17 DIAGNOSIS — Z923 Personal history of irradiation: Secondary | ICD-10-CM | POA: Insufficient documentation

## 2018-07-17 HISTORY — PX: IR ANGIOGRAM VISCERAL SELECTIVE: IMG657

## 2018-07-17 HISTORY — PX: IR ANGIOGRAM SELECTIVE EACH ADDITIONAL VESSEL: IMG667

## 2018-07-17 HISTORY — PX: IR 3D INDEPENDENT WKST: IMG2385

## 2018-07-17 HISTORY — PX: IR US GUIDE VASC ACCESS RIGHT: IMG2390

## 2018-07-17 HISTORY — PX: IR EMBO ARTERIAL NOT HEMORR HEMANG INC GUIDE ROADMAPPING: IMG5448

## 2018-07-17 LAB — COMPREHENSIVE METABOLIC PANEL
ALT: 19 U/L (ref 0–44)
AST: 22 U/L (ref 15–41)
Albumin: 4.1 g/dL (ref 3.5–5.0)
Alkaline Phosphatase: 89 U/L (ref 38–126)
Anion gap: 10 (ref 5–15)
BUN: 28 mg/dL — ABNORMAL HIGH (ref 8–23)
CO2: 26 mmol/L (ref 22–32)
Calcium: 9 mg/dL (ref 8.9–10.3)
Chloride: 104 mmol/L (ref 98–111)
Creatinine, Ser: 1.36 mg/dL — ABNORMAL HIGH (ref 0.61–1.24)
GFR calc Af Amer: >60 mL/min (ref >60)
GFR calc non Af Amer: 52 mL/min — ABNORMAL LOW (ref >60)
Glucose, Bld: 106 mg/dL — ABNORMAL HIGH (ref 70–99)
Potassium: 3.7 mmol/L (ref 3.5–5.1)
Sodium: 140 mmol/L (ref 135–145)
Total Bilirubin: 1.1 mg/dL (ref 0.3–1.2)
Total Protein: 6.9 g/dL (ref 6.5–8.1)

## 2018-07-17 LAB — CBC WITH DIFFERENTIAL/PLATELET
Abs Immature Granulocytes: 0.02 10*3/uL (ref 0.00–0.07)
Basophils Absolute: 0.1 10*3/uL (ref 0.0–0.1)
Basophils Relative: 1 %
Eosinophils Absolute: 0.3 10*3/uL (ref 0.0–0.5)
Eosinophils Relative: 4 %
HCT: 41.9 % (ref 39.0–52.0)
Hemoglobin: 13.2 g/dL (ref 13.0–17.0)
Immature Granulocytes: 0 %
Lymphocytes Relative: 32 %
Lymphs Abs: 2.1 10*3/uL (ref 0.7–4.0)
MCH: 27.9 pg (ref 26.0–34.0)
MCHC: 31.5 g/dL (ref 30.0–36.0)
MCV: 88.6 fL (ref 80.0–100.0)
Monocytes Absolute: 0.5 10*3/uL (ref 0.1–1.0)
Monocytes Relative: 7 %
Neutro Abs: 3.7 10*3/uL (ref 1.7–7.7)
Neutrophils Relative %: 56 %
Platelets: 124 10*3/uL — ABNORMAL LOW (ref 150–400)
RBC: 4.73 MIL/uL (ref 4.22–5.81)
RDW: 15.7 % — ABNORMAL HIGH (ref 11.5–15.5)
WBC: 6.6 10*3/uL (ref 4.0–10.5)
nRBC: 0 % (ref 0.0–0.2)

## 2018-07-17 LAB — PROTIME-INR
INR: 1.2 (ref 0.8–1.2)
Prothrombin Time: 15.4 seconds — ABNORMAL HIGH (ref 11.4–15.2)

## 2018-07-17 LAB — APTT: aPTT: 41 seconds — ABNORMAL HIGH (ref 24–36)

## 2018-07-17 MED ORDER — TECHNETIUM TO 99M ALBUMIN AGGREGATED
5.4000 | Freq: Once | INTRAVENOUS | Status: AC | PRN
  Administered 2018-07-17: 5.4 via INTRAVENOUS

## 2018-07-17 MED ORDER — SODIUM CHLORIDE 0.9 % IV SOLN: INTRAVENOUS | Status: DC

## 2018-07-17 MED ORDER — LIDOCAINE HCL (PF) 1 % IJ SOLN
INTRAMUSCULAR | Status: AC | PRN
  Administered 2018-07-17: 5 mL
  Administered 2018-07-17: 10 mL

## 2018-07-17 MED ORDER — NALOXONE HCL 0.4 MG/ML IJ SOLN
INTRAMUSCULAR | Status: AC
  Filled 2018-07-17: qty 1

## 2018-07-17 MED ORDER — IOHEXOL 300 MG/ML  SOLN
100.0000 mL | Freq: Once | INTRAMUSCULAR | Status: AC | PRN
  Administered 2018-07-17: 28 mL via INTRA_ARTERIAL

## 2018-07-17 MED ORDER — LIDOCAINE HCL 1 % IJ SOLN
INTRAMUSCULAR | Status: AC
  Filled 2018-07-17: qty 20

## 2018-07-17 MED ORDER — IOHEXOL 300 MG/ML  SOLN
100.0000 mL | Freq: Once | INTRAMUSCULAR | Status: AC | PRN
  Administered 2018-07-17: 45 mL via INTRA_ARTERIAL

## 2018-07-17 MED ORDER — MIDAZOLAM HCL 2 MG/2ML IJ SOLN
INTRAMUSCULAR | Status: AC | PRN
  Administered 2018-07-17: 1 mg via INTRAVENOUS
  Administered 2018-07-17 (×4): 0.5 mg via INTRAVENOUS
  Administered 2018-07-17: 1 mg via INTRAVENOUS

## 2018-07-17 MED ORDER — FENTANYL CITRATE (PF) 100 MCG/2ML IJ SOLN
INTRAMUSCULAR | Status: AC
  Filled 2018-07-17: qty 4

## 2018-07-17 MED ORDER — FLUMAZENIL 0.5 MG/5ML IV SOLN
INTRAVENOUS | Status: AC
  Filled 2018-07-17: qty 5

## 2018-07-17 MED ORDER — HEPARIN SOD (PORK) LOCK FLUSH 100 UNIT/ML IV SOLN
500.0000 [IU] | INTRAVENOUS | Status: AC | PRN
  Administered 2018-07-17: 500 [IU]
  Filled 2018-07-17: qty 5

## 2018-07-17 MED ORDER — MIDAZOLAM HCL 2 MG/2ML IJ SOLN
INTRAMUSCULAR | Status: AC
  Filled 2018-07-17: qty 6

## 2018-07-17 MED ORDER — IOHEXOL 300 MG/ML  SOLN
100.0000 mL | Freq: Once | INTRAMUSCULAR | Status: AC | PRN
  Administered 2018-07-17: 50 mL via INTRA_ARTERIAL

## 2018-07-17 MED ORDER — FENTANYL CITRATE (PF) 100 MCG/2ML IJ SOLN
INTRAMUSCULAR | Status: AC | PRN
  Administered 2018-07-17 (×2): 25 ug via INTRAVENOUS
  Administered 2018-07-17: 50 ug via INTRAVENOUS

## 2018-07-17 NOTE — Procedures (Signed)
Pre-procedure Diagnosis: Metastatic colon cancer Post-procedure Diagnosis: Same  Post mapping Y-90 radioembolization requiring percutaneous coil embolization of the GDA.  Complications: None Immediate EBL: None  Keep right leg straight for 4 hrs (utnil 1600).    Signed: Sandi Mariscal Pager: [phone] 07/17/2018, 12:40 PM

## 2018-07-17 NOTE — H&P (Signed)
Referring Physician(s): Ennever,P  Supervising Physician: Sandi Mariscal  Patient Status:  [facility]  Chief Complaint: Metastatic colon cancer to liver   Subjective: Patient familiar to IR service from right liver lesion biopsy on 10/27/2017, microwave ablation of caudate and right liver lesions on 11/22/2017 and recent consultation with Dr. Pascal Lux to discuss treatment options for progressive metastatic colon cancer to the liver on 06/19/2018.  He was deemed an appropriate candidate for hepatic Y 90 radioembolization and presents today for arterial roadmapping study /embolization/test Y 90 dosing.  He currently denies fever, headache, chest pain, dyspnea, cough, abdominal/back pain, nausea, vomiting or bleeding.  Additional medical history as below.  Past Medical History:  Diagnosis Date  . Anticoagulated on Coumadin    chronic long term  . Antineoplastic chemotherapy induced anemia   . Chronic atrial fibrillation    cardiologist--  dr Curly [facility] in [place])--- hx DVVC in 2006/  TEE 10-21-2015 (care everywhere)  ef 55-60%, moderate dilated RA, mild MR, RVSP 9mmHg  . CKD (chronic kidney disease), stage II   . Colon cancer metastasized to liver [facility])    current oncologist-- dr Marin Olp (previous oncologist at [facility])--- dx 12/ 2017,  Stage IIb ([id number]),  s/p  resection colon tumor 01-01-2016 ,  recurrent w/ liver mets via bx 08/ 2018  , had chemo then partial hepatectomy and completed chemo after surgery/ liver mets recurrence 08/ 2019  . Cushing's disease [facility])    endocriniologist-- dr Elisabeth Most [facility])  . Droopy eyelid, right    11-15-2017  . GERD (gastroesophageal reflux disease)   . Goals of care, counseling/discussion 08/30/2017  . Gout    11-15-2017 per pt last flare-up 2018  . History of benign pituitary tumor    resection 07-25-2013  . History of cancer chemotherapy    COMPLETED 02-20-2017  FOR  COLON CANCER WITH LIVER METS  . History of radiation  therapy    03/ 2016  remainder of pituitary tumor  . Hypertensive cardiovascular disease   . Hypothyroidism, secondary   . OA (osteoarthritis)   . Secondary adrenal insufficiency ([place])   . Secondary male hypogonadism   . Wears glasses   . Wears partial dentures    upper and lower   Past Surgical History:  Procedure Laterality Date  . COLOSTOMY TAKEDOWN  05-16-2016   @[facility]   . IR RADIOLOGIST EVAL & MGMT  10/10/2017  . IR RADIOLOGIST EVAL & MGMT  11/07/2017  . IR RADIOLOGIST EVAL & MGMT  12/21/2017  . IR RADIOLOGIST EVAL & MGMT  06/19/2018  . OPEN PARTIAL HEPATECTOMY   01-13-2017    @[facility]    AND CHOLECYSTECTOMY  . RADIOFREQUENCY ABLATION N/A 11/22/2017   Procedure: CT MICROWAVE ABLATION LIVER;  Surgeon: Sandi Mariscal, MD;  Location: [facility];  Service: Anesthesiology;  Laterality: N/A;  . REVISION TOTAL KNEE ARTHROPLASTY Left 12/20/2013  . SUBTOTAL COLECTOMY  01-01-2016  @[facility]    W/  CREATION ILEOSTOMY AND UMBILICAL HERNIA REPAIR  . TOTAL KNEE ARTHROPLASTY Bilateral left 11-16-2010;  right 10-26-2011  . TRANSPHENOIDAL PITUITARY RESECTION  07-25-2013   @[facility]      Allergies: Patient has no known allergies.  Medications: Prior to Admission medications   Medication Sig Start Date End Date Taking? Authorizing Provider  allopurinol (ZYLOPRIM) 300 MG tablet Take 300 mg by mouth every morning.  01/28/10  Yes [provider]  atorvastatin (LIPITOR) 40 MG tablet Take 40 mg by mouth daily. 03/22/18  Yes [provider]  carvedilol (COREG) 3.125  MG tablet Take 6.25 mg by mouth 2 (two) times daily.  04/24/18  Yes [provider]  CVS ASPIRIN ADULT LOW DOSE 81 MG chewable tablet Chew 81 mg by mouth daily. 02/07/18  Yes [provider]  CVS STOOL SOFTENER/LAXATIVE 8.6-50 MG tablet TAKE 2 TABLETS EVERY EVENING 03/22/18  Yes [provider]  ferrous sulfate 325 (65 FE) MG tablet Take 325 mg by mouth daily.   Yes [provider]  furosemide (LASIX)  20 MG tablet Take 20 mg by mouth daily.    Yes [provider]  hydrocortisone (CORTEF) 10 MG tablet Take 5-15 mg by mouth See admin instructions. Takes 15 mg in am and 5mg  in afternoon. 08/19/17  Yes [provider]  levothyroxine (SYNTHROID, LEVOTHROID) 88 MCG tablet Take 88 mcg by mouth daily before breakfast.  05/15/17  Yes [provider]  loperamide (IMODIUM) 2 MG capsule Take 2 mg by mouth as needed for diarrhea or loose stools.    Yes [provider]  NASAL [place] NA Place 1 spray into the nose daily.    Yes [provider]  pantoprazole (PROTONIX) 40 MG tablet Take 40 mg by mouth every morning.  05/16/14  Yes [provider]  polyvinyl alcohol (LIQUIFILM TEARS) 1.4 % ophthalmic solution Place 1 drop into both eyes daily as needed for dry eyes.    Yes [provider]  potassium chloride (KLOR-CON 10) 10 MEQ tablet Take 20 mEq by mouth daily. 11/17/14  Yes [provider]  carvedilol (COREG) 6.25 MG tablet TAKE 1 TABLET (6.25 MG TOTAL) BY MOUTH 2 TIMES DAILY WITH MEALS. 05/31/18   [provider]  prochlorperazine (COMPAZINE) 10 MG tablet Take 10 mg by mouth every 8 (eight) hours as needed for nausea or vomiting.  09/01/16   [provider]  testosterone (ANDRODERM) 4 MG/24HR PT24 patch Place 1 patch onto the skin daily.  04/17/15   [provider]  warfarin (COUMADIN) 5 MG tablet Take 5 mg by mouth daily. 07/05/17   [provider]     Vital Signs: BP (!) 145/98   Pulse (!) 50   Temp 98.2 F (36.8 C) (Oral)   Resp 18   Ht 5\' 10"  (1.778 m)   Wt 240 lb (108.9 kg)   SpO2 100%   BMI 34.44 kg/m   Physical Exam awake, alert.  Right ptosis noted.  Chest clear to auscultation bilaterally.  Clean, intact right chest wall Port-A-Cath.  Heart with bradycardic rate, irregular.  Abdomen soft, positive bowel sounds, nontender.  Trace pretibial edema bilaterally.  Imaging: No results found.   Labs:  CBC: Recent Labs    02/19/18 1153 05/03/18 0744 05/30/18 0930 06/29/18 0935  WBC 5.9 7.2 6.7 6.2  HGB 12.9* 13.2 13.4 12.6*  HCT 39.4 41.1 41.3 39.4  PLT 170 152 145* 135*    COAGS: Recent Labs    10/27/17 0651 11/22/17 0746  INR 1.51 1.07    BMP: Recent Labs    02/19/18 1153 05/03/18 0744 05/30/18 0930 06/29/18 0935  NA 142 142 142 141  K 3.4* 4.4 4.5 3.8  CL 106 105 105 107  CO2 28 30 31 27   GLUCOSE 136* 112* 130* 112*  BUN 13 23 23  26*  CALCIUM 8.9 9.5 9.3 9.5  CREATININE 1.29* 1.36* 1.26* 1.25*  GFRNONAA 56* 52* 57* 58*  GFRAA >60 >60 >60 >60    LIVER FUNCTION TESTS: Recent Labs    02/19/18 1153 05/03/18 0744 05/30/18 0930  06/29/18 0935  BILITOT 0.7 0.7 0.7 0.8  AST 17 22 19 20   ALT 10 19 15 14   ALKPHOS 98 93 90 84  PROT 6.2* 6.8 7.0 6.4*  ALBUMIN 3.8 3.9 4.0 3.7    Assessment and Plan: Patient with history of metastatic colon cancer to the liver and prior microwave ablation of both caudate and right liver lesions on 11/22/2017; now has evidence of disease progression on imaging and was seen recently in consultation by Dr. Pascal Lux and deemed appropriate candidate for hepatic Y 90 radioembolization.  He presents today for hepatic/visceral arterial roadmapping/embolization/test Y 90 dosing.Risks and benefits of procedure were discussed with the patient including, but not limited to bleeding, infection, vascular injury or contrast induced renal failure.  This interventional procedure involves the use of X-rays and because of the nature of the planned procedure, it is possible that we will have prolonged use of X-ray fluoroscopy.  Potential radiation risks to you include (but are not limited to) the following: - A slightly elevated risk for cancer  several years later in life. This risk is typically less than 0.5% percent. This risk is low in comparison to the normal incidence of human cancer, which is 33% for women and 50% for men according to  the [place]. - Radiation induced injury can include skin redness, resembling a rash, tissue breakdown / ulcers and hair loss (which can be temporary or permanent).   The likelihood of either of these occurring depends on the difficulty of the procedure and whether you are sensitive to radiation due to previous procedures, disease, or genetic conditions.   IF your procedure requires a prolonged use of radiation, you will be notified and given written instructions for further action.  It is your responsibility to monitor the irradiated area for the 2 weeks following the procedure and to notify your physician if you are concerned that you have suffered a radiation induced injury.    All of the patient's questions were answered, patient is agreeable to proceed.  Consent signed and in chart. Creatinine today 1.36      Electronically Signed: D. Rowe Robert, PA-C 07/17/2018, 9:05 AM   I spent a total of 25 minutes at the the patient's bedside AND on the patient's hospital floor or unit, greater than 50% of which was counseling/coordinating care for hepatic/visceral arteriogram with embolization/test Y 90 dosing.

## 2018-07-17 NOTE — Discharge Instructions (Addendum)
Hepatic Artery Radioembolization, Care After This sheet gives you information about how to care for yourself after your procedure. Your health care provider may also give you more specific instructions. If you have problems or questions, contact your health care provider. What can I expect after the procedure? After the procedure, it is common to have:  A slight fever for 1-2 weeks. If your fever gets worse, tell your health care provider.  Fatigue.  Loss of appetite. This should gradually improve after about 1 week.  Abdominal pain on your right side.  Soreness and tenderness in your groin area where the needle and catheter were placed (puncture site). Follow these instructions at home:  Puncture site care  Follow instructions from your health care provider about how to take care of the puncture site. Make sure you: ? Wash your hands with soap and water before you change your bandage (dressing). If soap and water are not available, use hand sanitizer. ? Change your dressing as told by your health care provider.  You may remove your dressing tomorrow.   ? Leave stitches (sutures), skin glue, or adhesive strips in place. These skin closures may need to stay in place for 2 weeks or longer. If adhesive strip edges start to loosen and curl up, you may trim the loose edges. Do not remove adhesive strips completely unless your health care provider tells you to do that.  Check your puncture site every day for signs of infection. Check for: ? More redness, swelling, or pain. ? More fluid or blood. ? Warmth. ? Pus or a bad smell. Activity  Rest and return to your normal activities as told by your health care provider. Ask your health care provider what activities are safe for you.  Do not drive for 24 hours after the procedure if you were given a medicine to help you relax (sedative).  Do not lift anything that is heavier than 10 lb (4.5 kg) until your health care provider says that it is  safe. Medicines  Take over-the-counter and prescription medicines only as told by your health care provider.  Do not drive or use heavy machinery while taking prescription pain medicine. Radiation precautions  For up to a week after your procedure, there will be a small amount of radioactivity near your liver. This is not especially dangerous to other people. However, you should follow these precautions for 7 days: ? Do not come in close contact with people. ? Do not sleep in the same bed as someone else. ? Do not hold children or babies. ? Do not have contact with pregnant women. General instructions  To prevent or treat constipation while you are taking prescription pain medicine, your health care provider may recommend that you: ? Drink enough fluid to keep your urine clear or pale yellow. ? Take over-the-counter or prescription medicines. ? Eat foods that are high in fiber, such as fresh fruits and vegetables, whole grains, and beans. ? Limit foods that are high in fat and processed sugars, such as fried and sweet foods.  Eat frequent small meals until your appetite returns. Follow instructions from your health care provider about eating or drinking restrictions.  Do not take baths, swim, or use a hot tub until your health care provider approves. You may take showers. Wash your puncture site with mild soap and water and pat the area dry.  You may shower tomorrow.  Wear compression stockings as told by your health care provider. These stockings help to prevent  blood clots and reduce swelling in your legs.  Keep all follow-up visits as told by your health care provider. This is important. You may need to have blood tests and imaging tests done. Contact a health care provider if:  You have more redness, swelling, or pain around your puncture site.  You have more fluid or blood coming from your puncture site.  Your puncture site feels warm to the touch.  You have pus or a bad  smell coming from your puncture site.  You have pain that: ? Gets worse. ? Does not get better with medicine. ? Feels like very bad heartburn. ? Is in the middle of your abdomen, above your belly button.  Your skin and the white parts of your eyes turn yellow (jaundice).  The color of your urine changes to dark brown.  The color of your stool changes to light yellow.  Your abdominal measurement (girth) increases in a short period of time.  You gain more than 5 lb (2.3 kg) in a short period of time. Get help right away if:  You have a fever that lasts longer than 2 weeks or is higher than what your health care provider told you to expect.  You develop any of the following in your legs: ? Pain. ? Swelling. ? Skin that is cold or pale or turns blue.  You have chest pain.  You have blood in your vomit, saliva, or stool.  You have trouble breathing. This information is not intended to replace advice given to you by your health care provider. Make sure you discuss any questions you have with your health care provider. Document Released: 12/25/2012 Document Revised: 12/02/2016 Document Reviewed: 09/19/2015 Elsevier Patient Education  [place] Radioembolization Discharge Instructions  You have been given a radioactive material during your procedure.  While it is safe for you to be discharged home from the hospital, you need to proceed directly home.    Do not use public transportation, including air travel, lasting more than 2 hours for 1 week.  Avoid crowded public places for 1 week.  Adult visitors should try to avoid close contact with you for 1 week.    Children and pregnant females should not visit or have close contact with you for 1 week.  Items that you touch are not radioactive.  Do not sleep in the same bed as your partner for 1 week, and a condom should be used for sexual activity during the first 24 hours.  Your blood may be radioactive and  caution should be used if any bleeding occurs during the recovery period.  Body fluids may be radioactive for 24 hours.  Wash your hands after voiding.  Men should sit to urinate.  Dispose of any soiled materials (flush down toilet or place in trash at home) during the first day.  Drink 6 to 8 glasses of fluids per day for 5 days to hydrate yourself.  If you need to see a doctor during the first week, you must let them know that you were treated with yttrium-90 microspheres, and will be slightly radioactive.  They can call Interventional Radiology [phone] with any questions.

## 2018-07-18 LAB — AFP TUMOR MARKER: AFP, Serum, Tumor Marker: 1.8 ng/mL (ref 0.0–8.3)

## 2018-07-18 LAB — CEA: CEA: 82.4 ng/mL — ABNORMAL HIGH (ref 0.0–4.7)

## 2018-07-27 ENCOUNTER — Other Ambulatory Visit: Payer: Self-pay | Admitting: Radiology

## 2018-07-30 ENCOUNTER — Other Ambulatory Visit: Payer: Self-pay

## 2018-07-30 ENCOUNTER — Encounter (HOSPITAL_COMMUNITY)
Admission: RE | Admit: 2018-07-30 | Discharge: 2018-07-30 | Disposition: A | Discharge status: Home / Self Care | Payer: Medicare HMO | Source: Ambulatory Visit | Attending: Interventional Radiology | Admitting: Interventional Radiology

## 2018-07-30 ENCOUNTER — Ambulatory Visit (HOSPITAL_COMMUNITY)
Admission: RE | Admit: 2018-07-30 | Discharge: 2018-07-30 | Disposition: A | Discharge status: Home / Self Care | Payer: Medicare HMO | Source: Ambulatory Visit | Attending: Interventional Radiology | Admitting: Interventional Radiology

## 2018-07-30 ENCOUNTER — Encounter (HOSPITAL_COMMUNITY): Payer: Self-pay

## 2018-07-30 ENCOUNTER — Other Ambulatory Visit (HOSPITAL_COMMUNITY): Payer: Self-pay | Admitting: Interventional Radiology

## 2018-07-30 DIAGNOSIS — Z79890 Hormone replacement therapy (postmenopausal): Secondary | ICD-10-CM | POA: Insufficient documentation

## 2018-07-30 DIAGNOSIS — N182 Chronic kidney disease, stage 2 (mild): Secondary | ICD-10-CM | POA: Insufficient documentation

## 2018-07-30 DIAGNOSIS — E249 Cushing's syndrome, unspecified: Secondary | ICD-10-CM | POA: Diagnosis not present

## 2018-07-30 DIAGNOSIS — E039 Hypothyroidism, unspecified: Secondary | ICD-10-CM | POA: Diagnosis not present

## 2018-07-30 DIAGNOSIS — Z7901 Long term (current) use of anticoagulants: Secondary | ICD-10-CM | POA: Diagnosis not present

## 2018-07-30 DIAGNOSIS — C787 Secondary malignant neoplasm of liver and intrahepatic bile duct: Secondary | ICD-10-CM | POA: Diagnosis present

## 2018-07-30 DIAGNOSIS — I4891 Unspecified atrial fibrillation: Secondary | ICD-10-CM | POA: Insufficient documentation

## 2018-07-30 DIAGNOSIS — Z923 Personal history of irradiation: Secondary | ICD-10-CM | POA: Insufficient documentation

## 2018-07-30 DIAGNOSIS — Z79899 Other long term (current) drug therapy: Secondary | ICD-10-CM | POA: Diagnosis not present

## 2018-07-30 DIAGNOSIS — M109 Gout, unspecified: Secondary | ICD-10-CM | POA: Insufficient documentation

## 2018-07-30 DIAGNOSIS — C189 Malignant neoplasm of colon, unspecified: Secondary | ICD-10-CM

## 2018-07-30 DIAGNOSIS — E291 Testicular hypofunction: Secondary | ICD-10-CM | POA: Diagnosis not present

## 2018-07-30 DIAGNOSIS — Z9221 Personal history of antineoplastic chemotherapy: Secondary | ICD-10-CM | POA: Diagnosis not present

## 2018-07-30 DIAGNOSIS — K219 Gastro-esophageal reflux disease without esophagitis: Secondary | ICD-10-CM | POA: Diagnosis not present

## 2018-07-30 DIAGNOSIS — M1990 Unspecified osteoarthritis, unspecified site: Secondary | ICD-10-CM | POA: Diagnosis not present

## 2018-07-30 DIAGNOSIS — E2740 Unspecified adrenocortical insufficiency: Secondary | ICD-10-CM | POA: Insufficient documentation

## 2018-07-30 HISTORY — PX: IR EMBO TUMOR ORGAN ISCHEMIA INFARCT INC GUIDE ROADMAPPING: IMG5449

## 2018-07-30 HISTORY — PX: IR US GUIDE VASC ACCESS RIGHT: IMG2390

## 2018-07-30 HISTORY — PX: IR ANGIOGRAM SELECTIVE EACH ADDITIONAL VESSEL: IMG667

## 2018-07-30 LAB — CBC WITH DIFFERENTIAL/PLATELET
Abs Immature Granulocytes: 0.02 10*3/uL (ref 0.00–0.07)
Basophils Absolute: 0.1 10*3/uL (ref 0.0–0.1)
Basophils Relative: 1 %
Eosinophils Absolute: 0.3 10*3/uL (ref 0.0–0.5)
Eosinophils Relative: 5 %
HCT: 40.9 % (ref 39.0–52.0)
Hemoglobin: 12.8 g/dL — ABNORMAL LOW (ref 13.0–17.0)
Immature Granulocytes: 0 %
Lymphocytes Relative: 31 %
Lymphs Abs: 2 10*3/uL (ref 0.7–4.0)
MCH: 28.1 pg (ref 26.0–34.0)
MCHC: 31.3 g/dL (ref 30.0–36.0)
MCV: 89.9 fL (ref 80.0–100.0)
Monocytes Absolute: 0.5 10*3/uL (ref 0.1–1.0)
Monocytes Relative: 8 %
Neutro Abs: 3.4 10*3/uL (ref 1.7–7.7)
Neutrophils Relative %: 55 %
Platelets: 128 10*3/uL — ABNORMAL LOW (ref 150–400)
RBC: 4.55 MIL/uL (ref 4.22–5.81)
RDW: 15.9 % — ABNORMAL HIGH (ref 11.5–15.5)
WBC: 6.3 10*3/uL (ref 4.0–10.5)
nRBC: 0 % (ref 0.0–0.2)

## 2018-07-30 LAB — COMPREHENSIVE METABOLIC PANEL
ALT: 22 U/L (ref 0–44)
AST: 23 U/L (ref 15–41)
Albumin: 3.8 g/dL (ref 3.5–5.0)
Alkaline Phosphatase: 88 U/L (ref 38–126)
Anion gap: 10 (ref 5–15)
BUN: 22 mg/dL (ref 8–23)
CO2: 26 mmol/L (ref 22–32)
Calcium: 8.6 mg/dL — ABNORMAL LOW (ref 8.9–10.3)
Chloride: 107 mmol/L (ref 98–111)
Creatinine, Ser: 1.36 mg/dL — ABNORMAL HIGH (ref 0.61–1.24)
GFR calc Af Amer: >60 mL/min (ref >60)
GFR calc non Af Amer: 52 mL/min — ABNORMAL LOW (ref >60)
Glucose, Bld: 108 mg/dL — ABNORMAL HIGH (ref 70–99)
Potassium: 3.7 mmol/L (ref 3.5–5.1)
Sodium: 143 mmol/L (ref 135–145)
Total Bilirubin: 0.8 mg/dL (ref 0.3–1.2)
Total Protein: 6.6 g/dL (ref 6.5–8.1)

## 2018-07-30 LAB — PROTIME-INR
INR: 1.1 (ref 0.8–1.2)
Prothrombin Time: 14.4 seconds (ref 11.4–15.2)

## 2018-07-30 MED ORDER — PIPERACILLIN-TAZOBACTAM 3.375 G IVPB
3.3750 g | Freq: Once | INTRAVENOUS | Status: AC
  Administered 2018-07-30: 10:00:00 3.375 g via INTRAVENOUS

## 2018-07-30 MED ORDER — IOHEXOL 300 MG/ML  SOLN
100.0000 mL | Freq: Once | INTRAMUSCULAR | Status: AC | PRN
  Administered 2018-07-30: 13:00:00 45 mL via INTRA_ARTERIAL

## 2018-07-30 MED ORDER — HEPARIN SOD (PORK) LOCK FLUSH 100 UNIT/ML IV SOLN
500.0000 [IU] | Freq: Once | INTRAVENOUS | Status: AC
  Administered 2018-07-30: 500 [IU] via INTRAVENOUS
  Filled 2018-07-30: qty 5

## 2018-07-30 MED ORDER — MIDAZOLAM HCL 2 MG/2ML IJ SOLN
INTRAMUSCULAR | Status: AC
  Filled 2018-07-30: qty 2

## 2018-07-30 MED ORDER — DEXAMETHASONE SODIUM PHOSPHATE 10 MG/ML IJ SOLN
8.0000 mg | Freq: Once | INTRAMUSCULAR | Status: AC
  Administered 2018-07-30: 8 mg via INTRAVENOUS
  Filled 2018-07-30: qty 1

## 2018-07-30 MED ORDER — LIDOCAINE HCL (PF) 1 % IJ SOLN
INTRAMUSCULAR | Status: AC | PRN
  Administered 2018-07-30: 5 mL

## 2018-07-30 MED ORDER — PANTOPRAZOLE SODIUM 40 MG IV SOLR
40.0000 mg | Freq: Once | INTRAVENOUS | Status: AC
  Administered 2018-07-30: 40 mg via INTRAVENOUS
  Filled 2018-07-30: qty 40

## 2018-07-30 MED ORDER — LIDOCAINE HCL 1 % IJ SOLN
INTRAMUSCULAR | Status: AC
  Filled 2018-07-30: qty 20

## 2018-07-30 MED ORDER — FENTANYL CITRATE (PF) 100 MCG/2ML IJ SOLN
INTRAMUSCULAR | Status: AC
  Filled 2018-07-30: qty 2

## 2018-07-30 MED ORDER — MIDAZOLAM HCL 2 MG/2ML IJ SOLN
INTRAMUSCULAR | Status: AC | PRN
  Administered 2018-07-30 (×4): 1 mg via INTRAVENOUS

## 2018-07-30 MED ORDER — YTTRIUM 90 INJECTION: 9.7000 | INJECTION | Freq: Once | INTRAVENOUS | Status: DC | PRN

## 2018-07-30 MED ORDER — SODIUM CHLORIDE 0.9 % IV SOLN
8.0000 mg | Freq: Once | INTRAVENOUS | Status: AC
  Administered 2018-07-30: 09:00:00 8 mg via INTRAVENOUS
  Filled 2018-07-30 (×2): qty 4

## 2018-07-30 MED ORDER — IOHEXOL 300 MG/ML  SOLN
100.0000 mL | Freq: Once | INTRAMUSCULAR | Status: AC | PRN
  Administered 2018-07-30: 13:00:00 15 mL via INTRA_ARTERIAL

## 2018-07-30 MED ORDER — FENTANYL CITRATE (PF) 100 MCG/2ML IJ SOLN
INTRAMUSCULAR | Status: AC | PRN
  Administered 2018-07-30 (×2): 50 ug via INTRAVENOUS

## 2018-07-30 MED ORDER — MIDAZOLAM HCL 2 MG/2ML IJ SOLN
INTRAMUSCULAR | Status: AC
  Filled 2018-07-30: qty 4

## 2018-07-30 MED ORDER — SODIUM CHLORIDE 0.9 % IV SOLN
INTRAVENOUS | Status: DC
  Administered 2018-07-30: 08:00:00 via INTRAVENOUS

## 2018-07-30 MED ORDER — PIPERACILLIN-TAZOBACTAM 3.375 G IVPB
INTRAVENOUS | Status: AC
  Administered 2018-07-30: 10:00:00 3.375 g via INTRAVENOUS
  Filled 2018-07-30: qty 50

## 2018-07-30 NOTE — Discharge Instructions (Signed)
Post Y-90 Radioembolization Discharge Instructions ° °You have been given a radioactive material during your procedure.  While it is safe for you to be discharged home from the hospital, you need to proceed directly home.   ° °Do not use public transportation, including air travel, lasting more than 2 hours for 1 week. ° °Avoid crowded public places for 1 week. ° °Adult visitors should try to avoid close contact with you for 1 week.   ° °Children and pregnant females should not visit or have close contact with you for 1 week. ° °Items that you touch are not radioactive. ° °Do not sleep in the same bed as your partner for 1 week, and a condom should be used for sexual activity during the first 24 hours. ° °Your blood may be radioactive and caution should be used if any bleeding occurs during the recovery period. ° °Body fluids may be radioactive for 24 hours.  Wash your hands after voiding.  Men should sit to urinate.  Dispose of any soiled materials (flush down toilet or place in trash at home) during the first day. ° °Drink 6 to 8 glasses of fluids per day for 5 days to hydrate yourself. ° °If you need to see a doctor during the first week, you must let them know that you were treated with yttrium-90 microspheres, and will be slightly radioactive.  They can call Interventional Radiology [phone] with any questions ° ° ° ° °.Moderate Conscious Sedation, Adult, Care After °These instructions provide you with information about caring for yourself after your procedure. Your health care provider may also give you more specific instructions. Your treatment has been planned according to current medical practices, but problems sometimes occur. Call your health care provider if you have any problems or questions after your procedure. °What can I expect after the procedure? °After your procedure, it is common: °· To feel sleepy for several hours. °· To feel clumsy and have poor balance for several hours. °· To have poor  judgment for several hours. °· To vomit if you eat too soon. °Follow these instructions at home: °For at least 24 hours after the procedure: ° °· Do not: °? Participate in activities where you could fall or become injured. °? Drive. °? Use heavy machinery. °? Drink alcohol. °? Take sleeping pills or medicines that cause drowsiness. °? Make important decisions or sign legal documents. °? Take care of children on your own. °· Rest. °Eating and drinking °· Follow the diet recommended by your health care provider. °· If you vomit: °? Drink water, juice, or soup when you can drink without vomiting. °? Make sure you have little or no nausea before eating solid foods. °General instructions °· Have a responsible adult stay with you until you are awake and alert. °· Take over-the-counter and prescription medicines only as told by your health care provider. °· If you smoke, do not smoke without supervision. °· Keep all follow-up visits as told by your health care provider. This is important. °Contact a health care provider if: °· You keep feeling nauseous or you keep vomiting. °· You feel light-headed. °· You develop a rash. °· You have a fever. °Get help right away if: °· You have trouble breathing. °This information is not intended to replace advice given to you by your health care provider. Make sure you discuss any questions you have with your health care provider. °Document Released: 10/10/2012 Document Revised: 12/02/2016 Document Reviewed: 04/11/2015 °Elsevier Patient Education © 2020 [employer]   Inc. ° °

## 2018-07-30 NOTE — Sedation Documentation (Signed)
Patient transported to [facility] by this RN.

## 2018-07-30 NOTE — H&P (Signed)
Referring Physician(s): Ennever,P  Supervising Physician: Sandi Mariscal  Patient Status: [facility]  Chief Complaint:  Metastatic colon cancer to liver  Subjective: Patient familiar to IR service from right liver lesion biopsy on 10/27/2017, microwave ablation of caudate and right liver lesions on 11/22/2017 and recent consultation with Dr. Pascal Lux to discuss treatment options for progressive metastatic colon cancer to the liver on 06/19/2018.  He was deemed an appropriate candidate for hepatic Y 90 radioembolization and presents today for the procedure.  He currently denies fever, headache, chest pain, dyspnea, cough, abdominal/back pain, nausea, vomiting or bleeding.  Additional medical history as below.  Past Medical History:  Diagnosis Date  . Anticoagulated on Coumadin    chronic long term  . Antineoplastic chemotherapy induced anemia   . Chronic atrial fibrillation    cardiologist--  dr Curly Rim [facility] in [place])--- hx DVVC in 2006/  TEE 10-21-2015 (care everywhere)  ef 55-60%, moderate dilated RA, mild MR, RVSP 61mmHg  . CKD (chronic kidney disease), stage II   . Colon cancer metastasized to liver [facility])    current oncologist-- dr Marin Olp (previous oncologist at [facility] oncology)--- dx 12/ 2017,  Stage IIb ([id number]),  s/p  resection colon tumor 01-01-2016 ,  recurrent w/ liver mets via bx 08/ 2018  , had chemo then partial hepatectomy and completed chemo after surgery/ liver mets recurrence 08/ 2019  . Cushing's disease [facility])    endocriniologist-- dr Elisabeth Most [facility])  . Droopy eyelid, right    11-15-2017  . GERD (gastroesophageal reflux disease)   . Goals of care, counseling/discussion 08/30/2017  . Gout    11-15-2017 per pt last flare-up 2018  . History of benign pituitary tumor    resection 07-25-2013  . History of cancer chemotherapy    COMPLETED 02-20-2017  FOR  COLON CANCER WITH LIVER METS  . History of radiation therapy    03/ 2016  remainder of pituitary tumor   . Hypertensive cardiovascular disease   . Hypothyroidism, secondary   . OA (osteoarthritis)   . Secondary adrenal insufficiency ([place])   . Secondary male hypogonadism   . Wears glasses   . Wears partial dentures    upper and lower   Past Surgical History:  Procedure Laterality Date  . COLOSTOMY TAKEDOWN  05-16-2016   @[facility]   . IR 3D INDEPENDENT [facility]ST  07/17/2018  . IR ANGIOGRAM SELECTIVE EACH ADDITIONAL VESSEL  07/17/2018  . IR ANGIOGRAM SELECTIVE EACH ADDITIONAL VESSEL  07/17/2018  . IR ANGIOGRAM SELECTIVE EACH ADDITIONAL VESSEL  07/17/2018  . IR ANGIOGRAM SELECTIVE EACH ADDITIONAL VESSEL  07/17/2018  . IR ANGIOGRAM SELECTIVE EACH ADDITIONAL VESSEL  07/17/2018  . IR ANGIOGRAM VISCERAL SELECTIVE  07/17/2018  . IR ANGIOGRAM VISCERAL SELECTIVE  07/17/2018  . IR EMBO ARTERIAL NOT HEMORR HEMANG INC GUIDE ROADMAPPING  07/17/2018  . IR RADIOLOGIST EVAL & MGMT  10/10/2017  . IR RADIOLOGIST EVAL & MGMT  11/07/2017  . IR RADIOLOGIST EVAL & MGMT  12/21/2017  . IR RADIOLOGIST EVAL & MGMT  06/19/2018  . IR US GUIDE VASC ACCESS RIGHT  07/17/2018  . OPEN PARTIAL HEPATECTOMY   01-13-2017    @[facility]    AND CHOLECYSTECTOMY  . RADIOFREQUENCY ABLATION N/A 11/22/2017   Procedure: CT MICROWAVE ABLATION LIVER;  Surgeon: Sandi Mariscal, MD;  Location: [facility];  Service: Anesthesiology;  Laterality: N/A;  . REVISION TOTAL KNEE ARTHROPLASTY Left 12/20/2013  . SUBTOTAL COLECTOMY  01-01-2016  @[facility]    W/  CREATION ILEOSTOMY AND UMBILICAL HERNIA REPAIR  .  TOTAL KNEE ARTHROPLASTY Bilateral left 11-16-2010;  right 10-26-2011  . TRANSPHENOIDAL PITUITARY RESECTION  07-25-2013   @[facility]       Allergies: Patient has no known allergies.  Medications: Prior to Admission medications   Medication Sig Start Date End Date Taking? Authorizing Provider  allopurinol (ZYLOPRIM) 300 MG tablet Take 300 mg by mouth every morning.  01/28/10   [provider]  atorvastatin (LIPITOR) 40 MG tablet Take 40 mg by mouth daily.  03/22/18   [provider]  carvedilol (COREG) 3.125 MG tablet Take 6.25 mg by mouth 2 (two) times daily.  04/24/18   [provider]  carvedilol (COREG) 6.25 MG tablet TAKE 1 TABLET (6.25 MG TOTAL) BY MOUTH 2 TIMES DAILY WITH MEALS. 05/31/18   [provider]  [employer] ASPIRIN ADULT LOW DOSE 81 MG chewable tablet Chew 81 mg by mouth daily. 02/07/18   [provider]  [employer] STOOL SOFTENER/LAXATIVE 8.6-50 MG tablet TAKE 2 TABLETS EVERY EVENING 03/22/18   [provider]  ferrous sulfate 325 (65 FE) MG tablet Take 325 mg by mouth daily.    [provider]  furosemide (LASIX) 20 MG tablet Take 20 mg by mouth daily.     [provider]  hydrocortisone (CORTEF) 10 MG tablet Take 5-15 mg by mouth See admin instructions. Takes 15 mg in am and 5mg  in afternoon. 08/19/17   [provider]  levothyroxine (SYNTHROID, LEVOTHROID) 88 MCG tablet Take 88 mcg by mouth daily before breakfast.  05/15/17   [provider]  loperamide (IMODIUM) 2 MG capsule Take 2 mg by mouth as needed for diarrhea or loose stools.     [provider]  NASAL [employer] NA Place 1 spray into the nose daily.     [provider]  pantoprazole (PROTONIX) 40 MG tablet Take 40 mg by mouth every morning.  05/16/14   [provider]  polyvinyl alcohol (LIQUIFILM TEARS) 1.4 % ophthalmic solution Place 1 drop into both eyes daily as needed for dry eyes.     [provider]  potassium chloride (KLOR-CON 10) 10 MEQ tablet Take 20 mEq by mouth daily. 11/17/14   [provider]  prochlorperazine (COMPAZINE) 10 MG tablet Take 10 mg by mouth every 8 (eight) hours as needed for nausea or vomiting.  09/01/16   [provider]  testosterone (ANDRODERM) 4 MG/24HR PT24 patch Place 1 patch onto the skin daily.  04/17/15   [provider]  warfarin (COUMADIN) 5 MG tablet Take 5 mg by mouth daily. 07/05/17   [provider]      Vital Signs: Blood pressure 145/73, temperature 97.7, heart rate 66, respirations 16, O2 sat 100% room air   Physical Exam :awake, alert.  Right ptosis noted.  Chest clear to auscultation bilaterally.  Clean, intact right chest wall Port-A-Cath.  Heart with bradycardic rate, irregular rhythm.  Abdomen soft, positive bowel sounds, nontender.  Trace pretibial edema bilaterally.  Imaging: No results found.  Labs:  CBC: Recent Labs    05/03/18 0744 05/30/18 0930 06/29/18 0935 07/17/18 0847  WBC 7.2 6.7 6.2 6.6  HGB 13.2 13.4 12.6* 13.2  HCT 41.1 41.3 39.4 41.9  PLT 152 145* 135* 124*    COAGS: Recent Labs    10/27/17 0651 11/22/17 0746 07/17/18 0847  INR 1.51 1.07 1.2  APTT  --   --  41*    BMP: Recent Labs    05/03/18 0744 05/30/18 0930 06/29/18 0935 07/17/18 0847  NA  142 142 141 140  K 4.4 4.5 3.8 3.7  CL 105 105 107 104  CO2 30 31 27 26   GLUCOSE 112* 130* 112* 106*  BUN 23 23 26* 28*  CALCIUM 9.5 9.3 9.5 9.0  CREATININE 1.36* 1.26* 1.25* 1.36*  GFRNONAA 52* 57* 58* 52*  GFRAA >60 >60 >60 >60    LIVER FUNCTION TESTS: Recent Labs    05/03/18 0744 05/30/18 0930 06/29/18 0935 07/17/18 0847  BILITOT 0.7 0.7 0.8 1.1  AST 22 19 20 22   ALT 19 15 14 19   ALKPHOS 93 90 84 89  PROT 6.8 7.0 6.4* 6.9  ALBUMIN 3.9 4.0 3.7 4.1    Assessment and Plan: Patient with history of metastatic colon cancer to the liver and prior microwave ablation of both caudate and right liver lesions on 11/22/2017; now has evidence of disease progression on imaging and was seen recently in consultation by Dr. Pascal Lux and deemed an appropriate candidate for hepatic Y 90 radioembolization.  He presents today for the procedure. Risks and benefits of procedure were discussed with the patient including, but not limited to bleeding, infection, vascular injury or contrast induced renal failure.  This interventional procedure involves the use of X-rays and because of the nature of the planned  procedure, it is possible that we will have prolonged use of X-ray fluoroscopy.  Potential radiation risks to you include (but are not limited to) the following: - A slightly elevated risk for cancer  several years later in life. This risk is typically less than 0.5% percent. This risk is low in comparison to the normal incidence of human cancer, which is 33% for women and 50% for men according to the [place]. - Radiation induced injury can include skin redness, resembling a rash, tissue breakdown / ulcers and hair loss (which can be temporary or permanent).   The likelihood of either of these occurring depends on the difficulty of the procedure and whether you are sensitive to radiation due to previous procedures, disease, or genetic conditions.   IF your procedure requires a prolonged use of radiation, you will be notified and given written instructions for further action.  It is your responsibility to monitor the irradiated area for the 2 weeks following the procedure and to notify your physician if you are concerned that you have suffered a radiation induced injury.    All of the patient's questions were answered, patient is agreeable to proceed.  Consent signed and in chart.  LABS PENDING    Electronically Signed: D. Rowe Robert, PA-C 07/30/2018, 8:19 AM   I spent a total of 20 minutes at the the patient's bedside AND on the patient's hospital floor or unit, greater than 50% of which was counseling/coordinating care for visceral/hepatic arteriogram with hepatic Y 90 radioembolization.

## 2018-07-30 NOTE — Sedation Documentation (Signed)
Patient is resting comfortably. 

## 2018-07-30 NOTE — Procedures (Signed)
Pre-procedure Diagnosis: Metastatic colon cancer Post-procedure Diagnosis: Same  Post Y 90 radioembolization of the medial segmental artery of the left lobe of the liver..    Complications: None Immediate  EBL: None  Keep right leg straight for 4 hrs (until 1600).    Signed: Sandi Mariscal Pager: [phone] 07/30/2018, 12:44 PM

## 2018-07-31 ENCOUNTER — Other Ambulatory Visit (HOSPITAL_COMMUNITY): Payer: Self-pay | Admitting: Interventional Radiology

## 2018-07-31 ENCOUNTER — Encounter (HOSPITAL_COMMUNITY): Payer: Self-pay | Admitting: Interventional Radiology

## 2018-07-31 DIAGNOSIS — C189 Malignant neoplasm of colon, unspecified: Secondary | ICD-10-CM

## 2018-07-31 DIAGNOSIS — C787 Secondary malignant neoplasm of liver and intrahepatic bile duct: Secondary | ICD-10-CM

## 2018-08-24 ENCOUNTER — Other Ambulatory Visit: Payer: Self-pay | Admitting: Radiology

## 2018-08-27 ENCOUNTER — Other Ambulatory Visit: Payer: Self-pay

## 2018-08-27 ENCOUNTER — Encounter (HOSPITAL_COMMUNITY)
Admission: RE | Admit: 2018-08-27 | Discharge: 2018-08-27 | Disposition: A | Discharge status: Home / Self Care | Payer: Medicare HMO | Source: Ambulatory Visit | Attending: Interventional Radiology | Admitting: Interventional Radiology

## 2018-08-27 ENCOUNTER — Ambulatory Visit (HOSPITAL_COMMUNITY)
Admission: RE | Admit: 2018-08-27 | Discharge: 2018-08-27 | Disposition: A | Discharge status: Home / Self Care | Payer: Medicare HMO | Source: Ambulatory Visit | Attending: Interventional Radiology | Admitting: Interventional Radiology

## 2018-08-27 ENCOUNTER — Encounter (HOSPITAL_COMMUNITY): Payer: Self-pay

## 2018-08-27 ENCOUNTER — Other Ambulatory Visit (HOSPITAL_COMMUNITY): Payer: Self-pay | Admitting: Interventional Radiology

## 2018-08-27 DIAGNOSIS — Z9221 Personal history of antineoplastic chemotherapy: Secondary | ICD-10-CM | POA: Diagnosis not present

## 2018-08-27 DIAGNOSIS — K219 Gastro-esophageal reflux disease without esophagitis: Secondary | ICD-10-CM | POA: Diagnosis not present

## 2018-08-27 DIAGNOSIS — I4891 Unspecified atrial fibrillation: Secondary | ICD-10-CM | POA: Insufficient documentation

## 2018-08-27 DIAGNOSIS — C787 Secondary malignant neoplasm of liver and intrahepatic bile duct: Secondary | ICD-10-CM | POA: Insufficient documentation

## 2018-08-27 DIAGNOSIS — Z79899 Other long term (current) drug therapy: Secondary | ICD-10-CM | POA: Diagnosis not present

## 2018-08-27 DIAGNOSIS — C189 Malignant neoplasm of colon, unspecified: Secondary | ICD-10-CM | POA: Insufficient documentation

## 2018-08-27 DIAGNOSIS — M109 Gout, unspecified: Secondary | ICD-10-CM | POA: Diagnosis not present

## 2018-08-27 DIAGNOSIS — Z79890 Hormone replacement therapy (postmenopausal): Secondary | ICD-10-CM | POA: Insufficient documentation

## 2018-08-27 DIAGNOSIS — M1990 Unspecified osteoarthritis, unspecified site: Secondary | ICD-10-CM | POA: Insufficient documentation

## 2018-08-27 DIAGNOSIS — D649 Anemia, unspecified: Secondary | ICD-10-CM | POA: Insufficient documentation

## 2018-08-27 DIAGNOSIS — N182 Chronic kidney disease, stage 2 (mild): Secondary | ICD-10-CM | POA: Insufficient documentation

## 2018-08-27 DIAGNOSIS — E039 Hypothyroidism, unspecified: Secondary | ICD-10-CM | POA: Insufficient documentation

## 2018-08-27 DIAGNOSIS — Z7901 Long term (current) use of anticoagulants: Secondary | ICD-10-CM | POA: Diagnosis not present

## 2018-08-27 HISTORY — PX: IR EMBO TUMOR ORGAN ISCHEMIA INFARCT INC GUIDE ROADMAPPING: IMG5449

## 2018-08-27 HISTORY — PX: IR ANGIOGRAM SELECTIVE EACH ADDITIONAL VESSEL: IMG667

## 2018-08-27 HISTORY — PX: IR US GUIDE VASC ACCESS RIGHT: IMG2390

## 2018-08-27 HISTORY — PX: IR ANGIOGRAM VISCERAL SELECTIVE: IMG657

## 2018-08-27 LAB — CBC WITH DIFFERENTIAL/PLATELET
Abs Immature Granulocytes: 0.02 10*3/uL (ref 0.00–0.07)
Basophils Absolute: 0.1 10*3/uL (ref 0.0–0.1)
Basophils Relative: 1 %
Eosinophils Absolute: 0.4 10*3/uL (ref 0.0–0.5)
Eosinophils Relative: 6 %
HCT: 41.8 % (ref 39.0–52.0)
Hemoglobin: 13.4 g/dL (ref 13.0–17.0)
Immature Granulocytes: 0 %
Lymphocytes Relative: 24 %
Lymphs Abs: 1.4 10*3/uL (ref 0.7–4.0)
MCH: 28.6 pg (ref 26.0–34.0)
MCHC: 32.1 g/dL (ref 30.0–36.0)
MCV: 89.1 fL (ref 80.0–100.0)
Monocytes Absolute: 0.4 10*3/uL (ref 0.1–1.0)
Monocytes Relative: 7 %
Neutro Abs: 3.7 10*3/uL (ref 1.7–7.7)
Neutrophils Relative %: 62 %
Platelets: 113 10*3/uL — ABNORMAL LOW (ref 150–400)
RBC: 4.69 MIL/uL (ref 4.22–5.81)
RDW: 15.9 % — ABNORMAL HIGH (ref 11.5–15.5)
WBC: 6 10*3/uL (ref 4.0–10.5)
nRBC: 0 % (ref 0.0–0.2)

## 2018-08-27 LAB — COMPREHENSIVE METABOLIC PANEL
ALT: 29 U/L (ref 0–44)
AST: 31 U/L (ref 15–41)
Albumin: 3.8 g/dL (ref 3.5–5.0)
Alkaline Phosphatase: 95 U/L (ref 38–126)
Anion gap: 7 (ref 5–15)
BUN: 27 mg/dL — ABNORMAL HIGH (ref 8–23)
CO2: 27 mmol/L (ref 22–32)
Calcium: 8.8 mg/dL — ABNORMAL LOW (ref 8.9–10.3)
Chloride: 108 mmol/L (ref 98–111)
Creatinine, Ser: 1.16 mg/dL (ref 0.61–1.24)
GFR calc Af Amer: >60 mL/min (ref >60)
GFR calc non Af Amer: >60 mL/min (ref >60)
Glucose, Bld: 115 mg/dL — ABNORMAL HIGH (ref 70–99)
Potassium: 3.6 mmol/L (ref 3.5–5.1)
Sodium: 142 mmol/L (ref 135–145)
Total Bilirubin: 1.1 mg/dL (ref 0.3–1.2)
Total Protein: 6.6 g/dL (ref 6.5–8.1)

## 2018-08-27 LAB — PROTIME-INR
INR: 1.1 (ref 0.8–1.2)
Prothrombin Time: 14.3 seconds (ref 11.4–15.2)

## 2018-08-27 MED ORDER — ONDANSETRON HCL 4 MG/2ML IJ SOLN
4.0000 mg | Freq: Once | INTRAMUSCULAR | Status: AC
  Administered 2018-08-27: 4 mg via INTRAVENOUS

## 2018-08-27 MED ORDER — FENTANYL CITRATE (PF) 100 MCG/2ML IJ SOLN
INTRAMUSCULAR | Status: AC | PRN
  Administered 2018-08-27 (×2): 50 ug via INTRAVENOUS

## 2018-08-27 MED ORDER — KETOROLAC TROMETHAMINE 30 MG/ML IJ SOLN
15.0000 mg | INTRAMUSCULAR | Status: AC
  Administered 2018-08-27: 15 mg via INTRAVENOUS

## 2018-08-27 MED ORDER — DEXAMETHASONE SODIUM PHOSPHATE 10 MG/ML IJ SOLN
INTRAMUSCULAR | Status: AC
  Administered 2018-08-27: 20 mg via INTRAVENOUS
  Filled 2018-08-27: qty 2

## 2018-08-27 MED ORDER — FENTANYL CITRATE (PF) 100 MCG/2ML IJ SOLN
INTRAMUSCULAR | Status: AC
  Filled 2018-08-27: qty 4

## 2018-08-27 MED ORDER — LIDOCAINE HCL (PF) 1 % IJ SOLN
INTRAMUSCULAR | Status: AC | PRN
  Administered 2018-08-27: 5 mL

## 2018-08-27 MED ORDER — MIDAZOLAM HCL 2 MG/2ML IJ SOLN
INTRAMUSCULAR | Status: AC | PRN
  Administered 2018-08-27 (×2): 1 mg via INTRAVENOUS

## 2018-08-27 MED ORDER — MIDAZOLAM HCL 2 MG/2ML IJ SOLN
INTRAMUSCULAR | Status: AC
  Filled 2018-08-27: qty 6

## 2018-08-27 MED ORDER — KETOROLAC TROMETHAMINE 30 MG/ML IJ SOLN
INTRAMUSCULAR | Status: AC
  Administered 2018-08-27: 10:00:00 15 mg via INTRAVENOUS
  Filled 2018-08-27: qty 1

## 2018-08-27 MED ORDER — HEPARIN SOD (PORK) LOCK FLUSH 100 UNIT/ML IV SOLN
250.0000 [IU] | INTRAVENOUS | Status: AC | PRN
  Administered 2018-08-27: 250 [IU]
  Filled 2018-08-27: qty 5

## 2018-08-27 MED ORDER — LIDOCAINE HCL 1 % IJ SOLN
INTRAMUSCULAR | Status: AC
  Filled 2018-08-27: qty 20

## 2018-08-27 MED ORDER — IOHEXOL 300 MG/ML  SOLN
100.0000 mL | Freq: Once | INTRAMUSCULAR | Status: AC | PRN
  Administered 2018-08-27: 50 mL via INTRA_ARTERIAL

## 2018-08-27 MED ORDER — PIPERACILLIN-TAZOBACTAM 3.375 G IVPB
3.3750 g | Freq: Once | INTRAVENOUS | Status: AC
  Administered 2018-08-27: 10:00:00 3.375 g via INTRAVENOUS

## 2018-08-27 MED ORDER — ONDANSETRON HCL 4 MG/2ML IJ SOLN
INTRAMUSCULAR | Status: AC
  Administered 2018-08-27: 4 mg via INTRAVENOUS
  Filled 2018-08-27: qty 2

## 2018-08-27 MED ORDER — PIPERACILLIN-TAZOBACTAM 3.375 G IVPB
INTRAVENOUS | Status: AC
  Administered 2018-08-27: 3.375 g via INTRAVENOUS
  Filled 2018-08-27: qty 50

## 2018-08-27 MED ORDER — PANTOPRAZOLE SODIUM 40 MG IV SOLR
40.0000 mg | Freq: Once | INTRAVENOUS | Status: DC
  Filled 2018-08-27: qty 40

## 2018-08-27 MED ORDER — SODIUM CHLORIDE 0.9 % IV SOLN
INTRAVENOUS | Status: DC
  Administered 2018-08-27: 09:00:00 via INTRAVENOUS

## 2018-08-27 MED ORDER — DEXAMETHASONE SODIUM PHOSPHATE 10 MG/ML IJ SOLN
20.0000 mg | Freq: Once | INTRAMUSCULAR | Status: AC
  Administered 2018-08-27: 20 mg via INTRAVENOUS

## 2018-08-27 MED ORDER — YTTRIUM 90 INJECTION: 30.6000 | INJECTION | Freq: Once | INTRAVENOUS | Status: DC | PRN

## 2018-08-27 NOTE — H&P (Signed)
Chief Complaint: Colon cancer with metastatic disease to the liver  Referring Physician(s): Ennever  Supervising Physician: Sandi Mariscal  Patient Status: [facility] - Out-pt  History of Present Illness: Zachary Willis is a 73 y.o. male who is familiar to our service from right liver lesion biopsy on 10/27/2017, microwave ablation of caudate and right liver lesions on 11/22/2017 and consultation with Dr. Pascal Lux to discuss treatment options for progressive metastatic colon cancer to the liver on 06/19/2018.   He underwent Y 90 radioembolization of the medial segmental artery of the left lobe of the liver on 07/30/2018 by Dr. Pascal Lux.   He has a lesion in the right lobe of the liver as well and is here for Y90 radioembolization of that lesion today.  He is NPO. Last dose of Warfarin was last Tuesday.  Past Medical History:  Diagnosis Date   Anticoagulated on Coumadin    chronic long term   Antineoplastic chemotherapy induced anemia    Chronic atrial fibrillation    cardiologist--  dr Curly Rim [facility] in [place])--- hx DVVC in 2006/  TEE 10-21-2015 (care everywhere)  ef 55-60%, moderate dilated RA, mild MR, RVSP 13mHg   CKD (chronic kidney disease), stage II    Colon cancer metastasized to liver ([facility]    current oncologist-- dr eMarin Olp([facility])--- dx 12/ 2017,  Stage IIb ([id number]),  s/p  resection colon tumor 01-01-2016 ,  recurrent w/ liver mets via bx 08/ 2018  , had chemo then partial hepatectomy and completed chemo after surgery/ liver mets recurrence 08/ 2019   [place]'s disease ([place]    endocriniologist-- dr eElisabeth Most([facility]   Droopy eyelid, right    11-15-2017   GERD (gastroesophageal reflux disease)    Goals of care, counseling/discussion 08/30/2017   Gout    11-15-2017 per pt last flare-up 2018   History of benign pituitary tumor    resection 07-25-2013   History of cancer chemotherapy    COMPLETED 02-20-2017  FOR  COLON  CANCER WITH LIVER METS   History of radiation therapy    03/ 2016  remainder of pituitary tumor   Hypertensive cardiovascular disease    Hypothyroidism, secondary    OA (osteoarthritis)    [place]    Secondary male hypogonadism    Wears glasses    Wears partial dentures    upper and lower    Past Surgical History:  Procedure Laterality Date   COLOSTOMY TAKEDOWN  05-16-2016   '@[facility]'    IR 3D INDEPENDENT [facility]ST  07/17/2018   IR ANGIOGRAM SELECTIVE EACH ADDITIONAL VESSEL  07/17/2018   IR ANGIOGRAM SELECTIVE EACH ADDITIONAL VESSEL  07/17/2018   IR ANGIOGRAM SELECTIVE EACH ADDITIONAL VESSEL  07/17/2018   IR ANGIOGRAM SELECTIVE EACH ADDITIONAL VESSEL  07/17/2018   IR ANGIOGRAM SELECTIVE EACH ADDITIONAL VESSEL  07/17/2018   IR ANGIOGRAM SELECTIVE EACH ADDITIONAL VESSEL  07/30/2018   IR ANGIOGRAM SELECTIVE EACH ADDITIONAL VESSEL  07/30/2018   IR ANGIOGRAM SELECTIVE EACH ADDITIONAL VESSEL  07/30/2018   IR ANGIOGRAM SELECTIVE EACH ADDITIONAL VESSEL  07/30/2018   IR ANGIOGRAM SELECTIVE EACH ADDITIONAL VESSEL  07/30/2018   IR ANGIOGRAM VISCERAL SELECTIVE  07/17/2018   IR ANGIOGRAM VISCERAL SELECTIVE  07/17/2018   IR EMBO ARTERIAL NOT HEMORR HEMANG INC GUIDE ROADMAPPING  07/17/2018   IR EMBO TUMOR ORGAN ISCHEMIA INFARCT INC GUIDE ROADMAPPING  07/30/2018   IR RADIOLOGIST EVAL & MGMT  10/10/2017   IR RADIOLOGIST EVAL & MGMT  11/07/2017   IR RADIOLOGIST EVAL & MGMT  12/21/2017   IR RADIOLOGIST EVAL & MGMT  06/19/2018   IR US GUIDE VASC ACCESS RIGHT  07/17/2018   IR US GUIDE VASC ACCESS RIGHT  07/30/2018   OPEN PARTIAL HEPATECTOMY   01-13-2017    '@[facility]'    AND CHOLECYSTECTOMY   RADIOFREQUENCY ABLATION N/A 11/22/2017   Procedure: CT MICROWAVE ABLATION LIVER;  Surgeon: Sandi Mariscal, MD;  Location: [facility]S;  Service: Anesthesiology;  Laterality: N/A;   REVISION TOTAL KNEE ARTHROPLASTY Left 12/20/2013   SUBTOTAL COLECTOMY  01-01-2016  '@[facility]'    W/  CREATION  ILEOSTOMY AND UMBILICAL HERNIA REPAIR   TOTAL KNEE ARTHROPLASTY Bilateral left 11-16-2010;  right 10-26-2011   TRANSPHENOIDAL PITUITARY RESECTION  07-25-2013   [facility]    Allergies: Patient has no known allergies.  Medications: Prior to Admission medications   Medication Sig Start Date End Date Taking? Authorizing Provider  allopurinol (ZYLOPRIM) 300 MG tablet Take 300 mg by mouth every morning.  01/28/10   [provider]  atorvastatin (LIPITOR) 40 MG tablet Take 40 mg by mouth daily. 03/22/18   [provider]  carvedilol (COREG) 3.125 MG tablet Take 6.25 mg by mouth 2 (two) times daily.  04/24/18   [provider]  carvedilol (COREG) 6.25 MG tablet TAKE 1 TABLET (6.25 MG TOTAL) BY MOUTH 2 TIMES DAILY WITH MEALS. 05/31/18   [provider]  [facility] ASPIRIN ADULT LOW DOSE 81 MG chewable tablet Chew 81 mg by mouth daily. 02/07/18   [provider]  [facility] STOOL SOFTENER/LAXATIVE 8.6-50 MG tablet TAKE 2 TABLETS EVERY EVENING 03/22/18   [provider]  ferrous sulfate 325 (65 FE) MG tablet Take 325 mg by mouth daily.    [provider]  furosemide (LASIX) 20 MG tablet Take 20 mg by mouth daily.     [provider]  hydrocortisone (CORTEF) 10 MG tablet Take 5-15 mg by mouth See admin instructions. Takes 15 mg in am and 47m in afternoon. 08/19/17   [provider]  levothyroxine (SYNTHROID, LEVOTHROID) 88 MCG tablet Take 88 mcg by mouth daily before breakfast.  05/15/17   [provider]  loperamide (IMODIUM) 2 MG capsule Take 2 mg by mouth as needed for diarrhea or loose stools.     [provider]  [facility] spray into the nose daily.     [provider]  pantoprazole (PROTONIX) 40 MG tablet Take 40 mg by mouth every morning.  05/16/14   [provider]  polyvinyl alcohol (LIQUIFILM TEARS) 1.4 % ophthalmic solution Place 1 drop into both eyes daily as needed for dry eyes.      [provider]  potassium chloride (KLOR-CON 10) 10 MEQ tablet Take 20 mEq by mouth daily. 11/17/14   [provider]  prochlorperazine (COMPAZINE) 10 MG tablet Take 10 mg by mouth every 8 (eight) hours as needed for nausea or vomiting.  09/01/16   [provider]  testosterone (ANDRODERM) 4 MG/24HR PT24 patch Place 1 patch onto the skin daily.  04/17/15   [provider]  warfarin (COUMADIN) 5 MG tablet Take 5 mg by mouth daily. 07/05/17   [provider]     History reviewed. No pertinent family history.  Social History   Socioeconomic History   Marital status: Married    Spouse name: Not on file   Number of children: Not on file   Years of education: Not on file   Highest  education level: Not on file  Occupational History   Not on file  Social Needs   Financial resource strain: Not on file   Food insecurity    Worry: Not on file    Inability: Not on file   Transportation needs    Medical: Not on file    Non-medical: Not on file  Tobacco Use   Smoking status: Never Smoker   Smokeless tobacco: Never Used  Substance and Sexual Activity   Alcohol use: Not Currently   Drug use: Never   Sexual activity: Not on file  Lifestyle   Physical activity    Days per week: Not on file    Minutes per session: Not on file   Stress: Not on file  Relationships   Social connections    Talks on phone: Not on file    Gets together: Not on file    Attends religious service: Not on file    Active member of club or organization: Not on file    Attends meetings of clubs or organizations: Not on file    Relationship status: Not on file  Other Topics Concern   Not on file  Social History Narrative   Not on file     Review of Systems: A 12 point ROS discussed and pertinent positives are indicated in the HPI above.  All other systems are negative.  Review of Systems  Vital Signs: 129/87 HR 57 98.2  Physical Exam Vitals  signs reviewed.  Constitutional:      Appearance: Normal appearance.  HENT:     Head: Normocephalic and atraumatic.  Eyes:     Extraocular Movements: Extraocular movements intact.  Neck:     Musculoskeletal: Normal range of motion.  Cardiovascular:     Rate and Rhythm: Normal rate. Rhythm irregular.  Pulmonary:     Effort: Pulmonary effort is normal.     Breath sounds: Normal breath sounds.  Abdominal:     General: There is no distension.     Palpations: Abdomen is soft.  Musculoskeletal: Normal range of motion.  Skin:    General: Skin is warm and dry.  Neurological:     General: No focal deficit present.     Mental Status: He is alert and oriented to person, place, and time.  Psychiatric:        Mood and Affect: Mood normal.        Behavior: Behavior normal.        Thought Content: Thought content normal.        Judgment: Judgment normal.     Imaging: Nm Liver Tumor Loc Imflam Spect 1 Day  Result Date: 07/30/2018 CLINICAL DATA:  Metastatic colon cancer. Unresectable liver metastasis. Status post percutaneous micro wave ablation of lesions within caudate and subcapsular aspect of right lobe of liver. First treatment to left lobe. EXAM: NUCLEAR MEDICINE SPECIAL MED RAD PHYSICS CONS; NUCLEAR MEDICINE RADIO PHARM THERAPY INTRA ARTERIAL; NUCLEAR MEDICINE TREATMENT PROCEDURE; NUCLEAR MEDICINE LIVER SCAN TECHNIQUE: In conjunction with the interventional radiologist a Y- Microsphere dose was calculated utilizing body surface area formulation. Calculated dose equal 13.5 mCi. Pre therapy MAA liver SPECT scan and CTA were evaluated. Utilizing a microcatheter system, the hepatic artery was selected and Y-90 microspheres were delivered in fractionated aliquots. Radiopharmaceutical was delivered by the interventional radiologist and nuclear radiologist. The patient tolerated procedure well. No adverse effects were noted. Bremsstrahlung planar and SPECT imaging of the abdomen following  intrahepatic arterial delivery of Y-90 microsphere was performed. RADIOPHARMACEUTICALS:  9.7 millicuries Y- 90 microspheres COMPARISON:  CT 06/18/2018. FINDINGS: Y - 90 microspheres therapy as above. First therapy the right hepatic lobe. Bremsstrahlung planar and SPECT imaging of the abdomen following intrahepatic arterial delivery of Y-90 microsphere demonstrates radioactivity localized to the left hepatic lobe. No evidence of extrahepatic activity. IMPRESSION: Successful Y - 90 microsphere delivery for treatment of unresectable liver metastasis. First therapy to the left lobe. Bremssstrahlung scan demonstrates activity localized to left hepatic lobe with no extrahepatic activity identified. Electronically Signed   By: Kerby Moors M.D.   On: 07/30/2018 14:18   Ir Angiogram Selective Each Additional Vessel  Result Date: 07/30/2018 INDICATION: History of metastatic colon cancer. Patient presents today for Y 90 radioembolization of the medial segmental branch of the left hepatic artery supplying the hypermetabolic metastasis within the caudate lobe. EXAM: 1. ULTRASOUND GUIDANCE FOR ARTERIAL ACCESS 2. SELECTIVE CELIAC ARTERIOGRAM 3. SELECTIVE COMMON HEPATIC ARTERIOGRAM 4. SUB SELECTIVE LEFT HEPATIC ARTERIOGRAM 5. SUB SELECTIVE ARTERIOGRAM OF THE LATERAL SEGMENTAL DIVISION OF THE LEFT HEPATIC ARTERY 6. SUB SELECTIVE ARTERIOGRAM OF THE MEDIAL SEGMENTAL DIVISION THE LEFT HEPATIC ARTERY AND FLUOROSCOPIC GUIDED ADMINISTRATION OF SIR-SPHERES COMPARISON:  Mapping Y 90 radioembolization-07/17/2018; CT abdomen and pelvis-06/18/2018; abdominal MRI-06/15/2018; PET-CT-02/15/2018 MEDICATIONS: Protonix 40 mg IV; Decadron 20 mg IV; Toradol 30 mg IV; Zofran 4 mg IV CONTRAST:  60 cc Omnipaque 300 ANESTHESIA/SEDATION: Moderate (conscious) sedation was employed during this procedure. A total of Versed 4 mg and Fentanyl 100 mcg was administered intravenously. Moderate Sedation Time: 83 minutes. The patient's level of consciousness  and vital signs were monitored continuously by radiology nursing throughout the procedure under my direct supervision. FLUOROSCOPY TIME:  19 minutes, 36 seconds (1,644 mGy) ACCESS: Right common femoral artery; hemostasis achieved with manual compression. COMPLICATIONS: None immediate. TECHNIQUE: Informed written consent was obtained from the patient after a discussion of the risks, benefits and alternatives to treatment. Questions regarding the procedure were encouraged and answered. A timeout was performed prior to the initiation of the procedure. The right groin was prepped and draped in the usual sterile fashion, and a sterile drape was applied covering the operative field. Maximum barrier sterile technique with sterile gowns and gloves were used for the procedure. A timeout was performed prior to the initiation of the procedure. Local anesthesia was provided with 1% lidocaine. The right femoral head was marked fluoroscopically. Under direct ultrasound guidance, the right common femoral artery was accessed with a micropuncture kit allowing placement of a of 5-French vascular sheath. An ultrasound image was saved for documentation purposes. A limited arteriogram performed through the side arm of the sheath confirming appropriate access within the right common femoral artery. Over a Britta Mccreedy wire, a Mickelson catheter was advanced to the level of the inferior thoracic aorta where it was reformed, back bled and flushed. The Mickelson catheter was utilized to select the celiac artery and a celiac arteriogram was performed. Next, a stiff glidewire was advanced into the main trunk of the celiac artery. Under imaging fluoroscopic guidance, the Mickelson catheter was exchanged for a Kumpe catheter which was utilized to select the common hepatic artery and a selective common hepatic arteriogram was performed. The Kumpe catheter was advanced to near the origin of the takeoff of the left hepatic artery. Next, a fathom 52  microwire was utilized to manipulate a high-flow Renegade microcatheter into the left hepatic artery and a selective left hepatic arteriogram. The microcatheter was advanced into the lateral segmental division of the left hepatic artery and a sub selective arteriogram  was performed. With some difficulty, the microcatheter was ultimately utilized select the medial segmental division of the left hepatic artery fair to supply the ill-defined hypoattenuating lesion within caudate and a sub selective arteriogram was performed. Radioembolization was then performed with Yttrium-90 SIR Spheres. Particles were administered via a microcatheter utilizing a completely enclosed system. Monitoring of antegrade flow was performed during and after the administration under fluoroscopy with use of contrast intermittently. After the administration of the particles, the microcatheter, outer catheter and administration system were then discarded into radiation safety receptacle. At this point, the procedure was terminated. The right common femoral approach vascular sheath was removed and hemostasis was achieved with manual compression. A dressing was placed. The patient tolerated procedure well without immediate postprocedural complication. All participants involved with the procedure were surveyed by the radiation safety officer prior to exiting the fluoroscopy suite. The patient was escorted to the nuclear medicine department for post-treatment imaging. FINDINGS: Superior mesenteric arteriogram demonstrates near complete occlusion of the GDA with minimal late phase delayed filling distally. Selective common hepatic arteriogram confirms this finding as well as adequately demonstrates takeoff of the left hepatic artery. Sub selective arteriogram of the lateral segmental division of the left hepatic artery was performed and is again negative for definitive extra hepatic vascular supply. The catheter was ultimately utilized to select the  medial segmental division of the left hepatic artery and a sub selective arteriogram was performed. There was an ill-defined area of relative oligemia overlying expected location of the caudate lobe which may represent the ill-defined hypo hepatic metastasis. Intermittent contrast injections during and after the radioembolization confirming preserved antegrade flow though note was made of a small amount of reflux into the medial segmental division of the left hepatic artery. IMPRESSION: Technically successful radioembolization of the medial segmental division of the left hepatic artery with Yttrium-90 microspheres. PLAN: - will obtain postprocedural CMP in 3 weeks to ensure adequate recovery from the Y 90 radioembolization. - this will be followed by consultation in the interventional radiology clinic, either in person or via tele conference, at which time we will discuss whether it is prudent to proceed with Y 90 radioembolization of the right lobe of the liver versus continued conservative management. Electronically Signed   By: Sandi Mariscal M.D.   On: 07/30/2018 14:43   Ir Angiogram Selective Each Additional Vessel  Result Date: 07/30/2018 INDICATION: History of metastatic colon cancer. Patient presents today for Y 90 radioembolization of the medial segmental branch of the left hepatic artery supplying the hypermetabolic metastasis within the caudate lobe. EXAM: 1. ULTRASOUND GUIDANCE FOR ARTERIAL ACCESS 2. SELECTIVE CELIAC ARTERIOGRAM 3. SELECTIVE COMMON HEPATIC ARTERIOGRAM 4. SUB SELECTIVE LEFT HEPATIC ARTERIOGRAM 5. SUB SELECTIVE ARTERIOGRAM OF THE LATERAL SEGMENTAL DIVISION OF THE LEFT HEPATIC ARTERY 6. SUB SELECTIVE ARTERIOGRAM OF THE MEDIAL SEGMENTAL DIVISION THE LEFT HEPATIC ARTERY AND FLUOROSCOPIC GUIDED ADMINISTRATION OF SIR-SPHERES COMPARISON:  Mapping Y 90 radioembolization-07/17/2018; CT abdomen and pelvis-06/18/2018; abdominal MRI-06/15/2018; PET-CT-02/15/2018 MEDICATIONS: Protonix 40 mg IV;  Decadron 20 mg IV; Toradol 30 mg IV; Zofran 4 mg IV CONTRAST:  60 cc Omnipaque 300 ANESTHESIA/SEDATION: Moderate (conscious) sedation was employed during this procedure. A total of Versed 4 mg and Fentanyl 100 mcg was administered intravenously. Moderate Sedation Time: 83 minutes. The patient's level of consciousness and vital signs were monitored continuously by radiology nursing throughout the procedure under my direct supervision. FLUOROSCOPY TIME:  19 minutes, 36 seconds (1,644 mGy) ACCESS: Right common femoral artery; hemostasis achieved with manual compression. COMPLICATIONS: None immediate.  TECHNIQUE: Informed written consent was obtained from the patient after a discussion of the risks, benefits and alternatives to treatment. Questions regarding the procedure were encouraged and answered. A timeout was performed prior to the initiation of the procedure. The right groin was prepped and draped in the usual sterile fashion, and a sterile drape was applied covering the operative field. Maximum barrier sterile technique with sterile gowns and gloves were used for the procedure. A timeout was performed prior to the initiation of the procedure. Local anesthesia was provided with 1% lidocaine. The right femoral head was marked fluoroscopically. Under direct ultrasound guidance, the right common femoral artery was accessed with a micropuncture kit allowing placement of a of 5-French vascular sheath. An ultrasound image was saved for documentation purposes. A limited arteriogram performed through the side arm of the sheath confirming appropriate access within the right common femoral artery. Over a Britta Mccreedy wire, a Mickelson catheter was advanced to the level of the inferior thoracic aorta where it was reformed, back bled and flushed. The Mickelson catheter was utilized to select the celiac artery and a celiac arteriogram was performed. Next, a stiff glidewire was advanced into the main trunk of the celiac artery. Under  imaging fluoroscopic guidance, the Mickelson catheter was exchanged for a Kumpe catheter which was utilized to select the common hepatic artery and a selective common hepatic arteriogram was performed. The Kumpe catheter was advanced to near the origin of the takeoff of the left hepatic artery. Next, a fathom 32 microwire was utilized to manipulate a high-flow Renegade microcatheter into the left hepatic artery and a selective left hepatic arteriogram. The microcatheter was advanced into the lateral segmental division of the left hepatic artery and a sub selective arteriogram was performed. With some difficulty, the microcatheter was ultimately utilized select the medial segmental division of the left hepatic artery fair to supply the ill-defined hypoattenuating lesion within caudate and a sub selective arteriogram was performed. Radioembolization was then performed with Yttrium-90 SIR Spheres. Particles were administered via a microcatheter utilizing a completely enclosed system. Monitoring of antegrade flow was performed during and after the administration under fluoroscopy with use of contrast intermittently. After the administration of the particles, the microcatheter, outer catheter and administration system were then discarded into radiation safety receptacle. At this point, the procedure was terminated. The right common femoral approach vascular sheath was removed and hemostasis was achieved with manual compression. A dressing was placed. The patient tolerated procedure well without immediate postprocedural complication. All participants involved with the procedure were surveyed by the radiation safety officer prior to exiting the fluoroscopy suite. The patient was escorted to the nuclear medicine department for post-treatment imaging. FINDINGS: Superior mesenteric arteriogram demonstrates near complete occlusion of the GDA with minimal late phase delayed filling distally. Selective common hepatic arteriogram  confirms this finding as well as adequately demonstrates takeoff of the left hepatic artery. Sub selective arteriogram of the lateral segmental division of the left hepatic artery was performed and is again negative for definitive extra hepatic vascular supply. The catheter was ultimately utilized to select the medial segmental division of the left hepatic artery and a sub selective arteriogram was performed. There was an ill-defined area of relative oligemia overlying expected location of the caudate lobe which may represent the ill-defined hypo hepatic metastasis. Intermittent contrast injections during and after the radioembolization confirming preserved antegrade flow though note was made of a small amount of reflux into the medial segmental division of the left hepatic artery. IMPRESSION: Technically successful  radioembolization of the medial segmental division of the left hepatic artery with Yttrium-90 microspheres. PLAN: - will obtain postprocedural CMP in 3 weeks to ensure adequate recovery from the Y 90 radioembolization. - this will be followed by consultation in the interventional radiology clinic, either in person or via tele conference, at which time we will discuss whether it is prudent to proceed with Y 90 radioembolization of the right lobe of the liver versus continued conservative management. Electronically Signed   By: Sandi Mariscal M.D.   On: 07/30/2018 14:43   Ir Angiogram Selective Each Additional Vessel  Result Date: 07/30/2018 INDICATION: History of metastatic colon cancer. Patient presents today for Y 90 radioembolization of the medial segmental branch of the left hepatic artery supplying the hypermetabolic metastasis within the caudate lobe. EXAM: 1. ULTRASOUND GUIDANCE FOR ARTERIAL ACCESS 2. SELECTIVE CELIAC ARTERIOGRAM 3. SELECTIVE COMMON HEPATIC ARTERIOGRAM 4. SUB SELECTIVE LEFT HEPATIC ARTERIOGRAM 5. SUB SELECTIVE ARTERIOGRAM OF THE LATERAL SEGMENTAL DIVISION OF THE LEFT HEPATIC  ARTERY 6. SUB SELECTIVE ARTERIOGRAM OF THE MEDIAL SEGMENTAL DIVISION THE LEFT HEPATIC ARTERY AND FLUOROSCOPIC GUIDED ADMINISTRATION OF SIR-SPHERES COMPARISON:  Mapping Y 90 radioembolization-07/17/2018; CT abdomen and pelvis-06/18/2018; abdominal MRI-06/15/2018; PET-CT-02/15/2018 MEDICATIONS: Protonix 40 mg IV; Decadron 20 mg IV; Toradol 30 mg IV; Zofran 4 mg IV CONTRAST:  60 cc Omnipaque 300 ANESTHESIA/SEDATION: Moderate (conscious) sedation was employed during this procedure. A total of Versed 4 mg and Fentanyl 100 mcg was administered intravenously. Moderate Sedation Time: 83 minutes. The patient's level of consciousness and vital signs were monitored continuously by radiology nursing throughout the procedure under my direct supervision. FLUOROSCOPY TIME:  19 minutes, 36 seconds (1,644 mGy) ACCESS: Right common femoral artery; hemostasis achieved with manual compression. COMPLICATIONS: None immediate. TECHNIQUE: Informed written consent was obtained from the patient after a discussion of the risks, benefits and alternatives to treatment. Questions regarding the procedure were encouraged and answered. A timeout was performed prior to the initiation of the procedure. The right groin was prepped and draped in the usual sterile fashion, and a sterile drape was applied covering the operative field. Maximum barrier sterile technique with sterile gowns and gloves were used for the procedure. A timeout was performed prior to the initiation of the procedure. Local anesthesia was provided with 1% lidocaine. The right femoral head was marked fluoroscopically. Under direct ultrasound guidance, the right common femoral artery was accessed with a micropuncture kit allowing placement of a of 5-French vascular sheath. An ultrasound image was saved for documentation purposes. A limited arteriogram performed through the side arm of the sheath confirming appropriate access within the right common femoral artery. Over a Britta Mccreedy wire,  a Mickelson catheter was advanced to the level of the inferior thoracic aorta where it was reformed, back bled and flushed. The Mickelson catheter was utilized to select the celiac artery and a celiac arteriogram was performed. Next, a stiff glidewire was advanced into the main trunk of the celiac artery. Under imaging fluoroscopic guidance, the Mickelson catheter was exchanged for a Kumpe catheter which was utilized to select the common hepatic artery and a selective common hepatic arteriogram was performed. The Kumpe catheter was advanced to near the origin of the takeoff of the left hepatic artery. Next, a fathom 102 microwire was utilized to manipulate a high-flow Renegade microcatheter into the left hepatic artery and a selective left hepatic arteriogram. The microcatheter was advanced into the lateral segmental division of the left hepatic artery and a sub selective arteriogram was performed. With some difficulty, the  microcatheter was ultimately utilized select the medial segmental division of the left hepatic artery fair to supply the ill-defined hypoattenuating lesion within caudate and a sub selective arteriogram was performed. Radioembolization was then performed with Yttrium-90 SIR Spheres. Particles were administered via a microcatheter utilizing a completely enclosed system. Monitoring of antegrade flow was performed during and after the administration under fluoroscopy with use of contrast intermittently. After the administration of the particles, the microcatheter, outer catheter and administration system were then discarded into radiation safety receptacle. At this point, the procedure was terminated. The right common femoral approach vascular sheath was removed and hemostasis was achieved with manual compression. A dressing was placed. The patient tolerated procedure well without immediate postprocedural complication. All participants involved with the procedure were surveyed by the radiation safety  officer prior to exiting the fluoroscopy suite. The patient was escorted to the nuclear medicine department for post-treatment imaging. FINDINGS: Superior mesenteric arteriogram demonstrates near complete occlusion of the GDA with minimal late phase delayed filling distally. Selective common hepatic arteriogram confirms this finding as well as adequately demonstrates takeoff of the left hepatic artery. Sub selective arteriogram of the lateral segmental division of the left hepatic artery was performed and is again negative for definitive extra hepatic vascular supply. The catheter was ultimately utilized to select the medial segmental division of the left hepatic artery and a sub selective arteriogram was performed. There was an ill-defined area of relative oligemia overlying expected location of the caudate lobe which may represent the ill-defined hypo hepatic metastasis. Intermittent contrast injections during and after the radioembolization confirming preserved antegrade flow though note was made of a small amount of reflux into the medial segmental division of the left hepatic artery. IMPRESSION: Technically successful radioembolization of the medial segmental division of the left hepatic artery with Yttrium-90 microspheres. PLAN: - will obtain postprocedural CMP in 3 weeks to ensure adequate recovery from the Y 90 radioembolization. - this will be followed by consultation in the interventional radiology clinic, either in person or via tele conference, at which time we will discuss whether it is prudent to proceed with Y 90 radioembolization of the right lobe of the liver versus continued conservative management. Electronically Signed   By: Sandi Mariscal M.D.   On: 07/30/2018 14:43   Ir Angiogram Selective Each Additional Vessel  Result Date: 07/30/2018 INDICATION: History of metastatic colon cancer. Patient presents today for Y 90 radioembolization of the medial segmental branch of the left hepatic artery  supplying the hypermetabolic metastasis within the caudate lobe. EXAM: 1. ULTRASOUND GUIDANCE FOR ARTERIAL ACCESS 2. SELECTIVE CELIAC ARTERIOGRAM 3. SELECTIVE COMMON HEPATIC ARTERIOGRAM 4. SUB SELECTIVE LEFT HEPATIC ARTERIOGRAM 5. SUB SELECTIVE ARTERIOGRAM OF THE LATERAL SEGMENTAL DIVISION OF THE LEFT HEPATIC ARTERY 6. SUB SELECTIVE ARTERIOGRAM OF THE MEDIAL SEGMENTAL DIVISION THE LEFT HEPATIC ARTERY AND FLUOROSCOPIC GUIDED ADMINISTRATION OF SIR-SPHERES COMPARISON:  Mapping Y 90 radioembolization-07/17/2018; CT abdomen and pelvis-06/18/2018; abdominal MRI-06/15/2018; PET-CT-02/15/2018 MEDICATIONS: Protonix 40 mg IV; Decadron 20 mg IV; Toradol 30 mg IV; Zofran 4 mg IV CONTRAST:  60 cc Omnipaque 300 ANESTHESIA/SEDATION: Moderate (conscious) sedation was employed during this procedure. A total of Versed 4 mg and Fentanyl 100 mcg was administered intravenously. Moderate Sedation Time: 83 minutes. The patient's level of consciousness and vital signs were monitored continuously by radiology nursing throughout the procedure under my direct supervision. FLUOROSCOPY TIME:  19 minutes, 36 seconds (1,644 mGy) ACCESS: Right common femoral artery; hemostasis achieved with manual compression. COMPLICATIONS: None immediate. TECHNIQUE: Informed written consent was obtained  from the patient after a discussion of the risks, benefits and alternatives to treatment. Questions regarding the procedure were encouraged and answered. A timeout was performed prior to the initiation of the procedure. The right groin was prepped and draped in the usual sterile fashion, and a sterile drape was applied covering the operative field. Maximum barrier sterile technique with sterile gowns and gloves were used for the procedure. A timeout was performed prior to the initiation of the procedure. Local anesthesia was provided with 1% lidocaine. The right femoral head was marked fluoroscopically. Under direct ultrasound guidance, the right common femoral  artery was accessed with a micropuncture kit allowing placement of a of 5-French vascular sheath. An ultrasound image was saved for documentation purposes. A limited arteriogram performed through the side arm of the sheath confirming appropriate access within the right common femoral artery. Over a Britta Mccreedy wire, a Mickelson catheter was advanced to the level of the inferior thoracic aorta where it was reformed, back bled and flushed. The Mickelson catheter was utilized to select the celiac artery and a celiac arteriogram was performed. Next, a stiff glidewire was advanced into the main trunk of the celiac artery. Under imaging fluoroscopic guidance, the Mickelson catheter was exchanged for a Kumpe catheter which was utilized to select the common hepatic artery and a selective common hepatic arteriogram was performed. The Kumpe catheter was advanced to near the origin of the takeoff of the left hepatic artery. Next, a fathom 70 microwire was utilized to manipulate a high-flow Renegade microcatheter into the left hepatic artery and a selective left hepatic arteriogram. The microcatheter was advanced into the lateral segmental division of the left hepatic artery and a sub selective arteriogram was performed. With some difficulty, the microcatheter was ultimately utilized select the medial segmental division of the left hepatic artery fair to supply the ill-defined hypoattenuating lesion within caudate and a sub selective arteriogram was performed. Radioembolization was then performed with Yttrium-90 SIR Spheres. Particles were administered via a microcatheter utilizing a completely enclosed system. Monitoring of antegrade flow was performed during and after the administration under fluoroscopy with use of contrast intermittently. After the administration of the particles, the microcatheter, outer catheter and administration system were then discarded into radiation safety receptacle. At this point, the procedure was  terminated. The right common femoral approach vascular sheath was removed and hemostasis was achieved with manual compression. A dressing was placed. The patient tolerated procedure well without immediate postprocedural complication. All participants involved with the procedure were surveyed by the radiation safety officer prior to exiting the fluoroscopy suite. The patient was escorted to the nuclear medicine department for post-treatment imaging. FINDINGS: Superior mesenteric arteriogram demonstrates near complete occlusion of the GDA with minimal late phase delayed filling distally. Selective common hepatic arteriogram confirms this finding as well as adequately demonstrates takeoff of the left hepatic artery. Sub selective arteriogram of the lateral segmental division of the left hepatic artery was performed and is again negative for definitive extra hepatic vascular supply. The catheter was ultimately utilized to select the medial segmental division of the left hepatic artery and a sub selective arteriogram was performed. There was an ill-defined area of relative oligemia overlying expected location of the caudate lobe which may represent the ill-defined hypo hepatic metastasis. Intermittent contrast injections during and after the radioembolization confirming preserved antegrade flow though note was made of a small amount of reflux into the medial segmental division of the left hepatic artery. IMPRESSION: Technically successful radioembolization of the medial segmental division  of the left hepatic artery with Yttrium-90 microspheres. PLAN: - will obtain postprocedural CMP in 3 weeks to ensure adequate recovery from the Y 90 radioembolization. - this will be followed by consultation in the interventional radiology clinic, either in person or via tele conference, at which time we will discuss whether it is prudent to proceed with Y 90 radioembolization of the right lobe of the liver versus continued conservative  management. Electronically Signed   By: Sandi Mariscal M.D.   On: 07/30/2018 14:43   Ir Angiogram Selective Each Additional Vessel  Result Date: 07/30/2018 INDICATION: History of metastatic colon cancer. Patient presents today for Y 90 radioembolization of the medial segmental branch of the left hepatic artery supplying the hypermetabolic metastasis within the caudate lobe. EXAM: 1. ULTRASOUND GUIDANCE FOR ARTERIAL ACCESS 2. SELECTIVE CELIAC ARTERIOGRAM 3. SELECTIVE COMMON HEPATIC ARTERIOGRAM 4. SUB SELECTIVE LEFT HEPATIC ARTERIOGRAM 5. SUB SELECTIVE ARTERIOGRAM OF THE LATERAL SEGMENTAL DIVISION OF THE LEFT HEPATIC ARTERY 6. SUB SELECTIVE ARTERIOGRAM OF THE MEDIAL SEGMENTAL DIVISION THE LEFT HEPATIC ARTERY AND FLUOROSCOPIC GUIDED ADMINISTRATION OF SIR-SPHERES COMPARISON:  Mapping Y 90 radioembolization-07/17/2018; CT abdomen and pelvis-06/18/2018; abdominal MRI-06/15/2018; PET-CT-02/15/2018 MEDICATIONS: Protonix 40 mg IV; Decadron 20 mg IV; Toradol 30 mg IV; Zofran 4 mg IV CONTRAST:  60 cc Omnipaque 300 ANESTHESIA/SEDATION: Moderate (conscious) sedation was employed during this procedure. A total of Versed 4 mg and Fentanyl 100 mcg was administered intravenously. Moderate Sedation Time: 83 minutes. The patient's level of consciousness and vital signs were monitored continuously by radiology nursing throughout the procedure under my direct supervision. FLUOROSCOPY TIME:  19 minutes, 36 seconds (1,644 mGy) ACCESS: Right common femoral artery; hemostasis achieved with manual compression. COMPLICATIONS: None immediate. TECHNIQUE: Informed written consent was obtained from the patient after a discussion of the risks, benefits and alternatives to treatment. Questions regarding the procedure were encouraged and answered. A timeout was performed prior to the initiation of the procedure. The right groin was prepped and draped in the usual sterile fashion, and a sterile drape was applied covering the operative field. Maximum  barrier sterile technique with sterile gowns and gloves were used for the procedure. A timeout was performed prior to the initiation of the procedure. Local anesthesia was provided with 1% lidocaine. The right femoral head was marked fluoroscopically. Under direct ultrasound guidance, the right common femoral artery was accessed with a micropuncture kit allowing placement of a of 5-French vascular sheath. An ultrasound image was saved for documentation purposes. A limited arteriogram performed through the side arm of the sheath confirming appropriate access within the right common femoral artery. Over a Britta Mccreedy wire, a Mickelson catheter was advanced to the level of the inferior thoracic aorta where it was reformed, back bled and flushed. The Mickelson catheter was utilized to select the celiac artery and a celiac arteriogram was performed. Next, a stiff glidewire was advanced into the main trunk of the celiac artery. Under imaging fluoroscopic guidance, the Mickelson catheter was exchanged for a Kumpe catheter which was utilized to select the common hepatic artery and a selective common hepatic arteriogram was performed. The Kumpe catheter was advanced to near the origin of the takeoff of the left hepatic artery. Next, a fathom 81 microwire was utilized to manipulate a high-flow Renegade microcatheter into the left hepatic artery and a selective left hepatic arteriogram. The microcatheter was advanced into the lateral segmental division of the left hepatic artery and a sub selective arteriogram was performed. With some difficulty, the microcatheter was ultimately utilized select the  medial segmental division of the left hepatic artery fair to supply the ill-defined hypoattenuating lesion within caudate and a sub selective arteriogram was performed. Radioembolization was then performed with Yttrium-90 SIR Spheres. Particles were administered via a microcatheter utilizing a completely enclosed system. Monitoring of  antegrade flow was performed during and after the administration under fluoroscopy with use of contrast intermittently. After the administration of the particles, the microcatheter, outer catheter and administration system were then discarded into radiation safety receptacle. At this point, the procedure was terminated. The right common femoral approach vascular sheath was removed and hemostasis was achieved with manual compression. A dressing was placed. The patient tolerated procedure well without immediate postprocedural complication. All participants involved with the procedure were surveyed by the radiation safety officer prior to exiting the fluoroscopy suite. The patient was escorted to the nuclear medicine department for post-treatment imaging. FINDINGS: Superior mesenteric arteriogram demonstrates near complete occlusion of the GDA with minimal late phase delayed filling distally. Selective common hepatic arteriogram confirms this finding as well as adequately demonstrates takeoff of the left hepatic artery. Sub selective arteriogram of the lateral segmental division of the left hepatic artery was performed and is again negative for definitive extra hepatic vascular supply. The catheter was ultimately utilized to select the medial segmental division of the left hepatic artery and a sub selective arteriogram was performed. There was an ill-defined area of relative oligemia overlying expected location of the caudate lobe which may represent the ill-defined hypo hepatic metastasis. Intermittent contrast injections during and after the radioembolization confirming preserved antegrade flow though note was made of a small amount of reflux into the medial segmental division of the left hepatic artery. IMPRESSION: Technically successful radioembolization of the medial segmental division of the left hepatic artery with Yttrium-90 microspheres. PLAN: - will obtain postprocedural CMP in 3 weeks to ensure adequate  recovery from the Y 90 radioembolization. - this will be followed by consultation in the interventional radiology clinic, either in person or via tele conference, at which time we will discuss whether it is prudent to proceed with Y 90 radioembolization of the right lobe of the liver versus continued conservative management. Electronically Signed   By: Sandi Mariscal M.D.   On: 07/30/2018 14:43   Nm Special Med Rad Physics Cons  Result Date: 07/30/2018 CLINICAL DATA:  Metastatic colon cancer. Unresectable liver metastasis. Status post percutaneous micro wave ablation of lesions within caudate and subcapsular aspect of right lobe of liver. First treatment to left lobe. EXAM: NUCLEAR MEDICINE SPECIAL MED RAD PHYSICS CONS; NUCLEAR MEDICINE RADIO PHARM THERAPY INTRA ARTERIAL; NUCLEAR MEDICINE TREATMENT PROCEDURE; NUCLEAR MEDICINE LIVER SCAN TECHNIQUE: In conjunction with the interventional radiologist a Y- Microsphere dose was calculated utilizing body surface area formulation. Calculated dose equal 13.5 mCi. Pre therapy MAA liver SPECT scan and CTA were evaluated. Utilizing a microcatheter system, the hepatic artery was selected and Y-90 microspheres were delivered in fractionated aliquots. Radiopharmaceutical was delivered by the interventional radiologist and nuclear radiologist. The patient tolerated procedure well. No adverse effects were noted. Bremsstrahlung planar and SPECT imaging of the abdomen following intrahepatic arterial delivery of Y-90 microsphere was performed. RADIOPHARMACEUTICALS:  9.7 millicuries Y- 90 microspheres COMPARISON:  CT 06/18/2018. FINDINGS: Y - 90 microspheres therapy as above. First therapy the right hepatic lobe. Bremsstrahlung planar and SPECT imaging of the abdomen following intrahepatic arterial delivery of Y-90 microsphere demonstrates radioactivity localized to the left hepatic lobe. No evidence of extrahepatic activity. IMPRESSION: Successful Y - 90 microsphere delivery for  treatment of unresectable liver metastasis. First therapy to the left lobe. Bremssstrahlung scan demonstrates activity localized to left hepatic lobe with no extrahepatic activity identified. Electronically Signed   By: Kerby Moors M.D.   On: 07/30/2018 14:18   Nm Special Treatment Procedure  Result Date: 07/30/2018 CLINICAL DATA:  Metastatic colon cancer. Unresectable liver metastasis. Status post percutaneous micro wave ablation of lesions within caudate and subcapsular aspect of right lobe of liver. First treatment to left lobe. EXAM: NUCLEAR MEDICINE SPECIAL MED RAD PHYSICS CONS; NUCLEAR MEDICINE RADIO PHARM THERAPY INTRA ARTERIAL; NUCLEAR MEDICINE TREATMENT PROCEDURE; NUCLEAR MEDICINE LIVER SCAN TECHNIQUE: In conjunction with the interventional radiologist a Y- Microsphere dose was calculated utilizing body surface area formulation. Calculated dose equal 13.5 mCi. Pre therapy MAA liver SPECT scan and CTA were evaluated. Utilizing a microcatheter system, the hepatic artery was selected and Y-90 microspheres were delivered in fractionated aliquots. Radiopharmaceutical was delivered by the interventional radiologist and nuclear radiologist. The patient tolerated procedure well. No adverse effects were noted. Bremsstrahlung planar and SPECT imaging of the abdomen following intrahepatic arterial delivery of Y-90 microsphere was performed. RADIOPHARMACEUTICALS:  9.7 millicuries Y- 90 microspheres COMPARISON:  CT 06/18/2018. FINDINGS: Y - 90 microspheres therapy as above. First therapy the right hepatic lobe. Bremsstrahlung planar and SPECT imaging of the abdomen following intrahepatic arterial delivery of Y-90 microsphere demonstrates radioactivity localized to the left hepatic lobe. No evidence of extrahepatic activity. IMPRESSION: Successful Y - 90 microsphere delivery for treatment of unresectable liver metastasis. First therapy to the left lobe. Bremssstrahlung scan demonstrates activity localized to left  hepatic lobe with no extrahepatic activity identified. Electronically Signed   By: Kerby Moors M.D.   On: 07/30/2018 14:18   Ir US Guide Vasc Access Right  Result Date: 07/31/2018 INDICATION: History of metastatic colon cancer. Patient presents today for Y 90 radioembolization of the medial segmental branch of the left hepatic artery supplying the hypermetabolic metastasis within the caudate lobe. EXAM: 1. ULTRASOUND GUIDANCE FOR ARTERIAL ACCESS 2. SELECTIVE CELIAC ARTERIOGRAM 3. SELECTIVE COMMON HEPATIC ARTERIOGRAM 4. SUB SELECTIVE LEFT HEPATIC ARTERIOGRAM 5. SUB SELECTIVE ARTERIOGRAM OF THE LATERAL SEGMENTAL DIVISION OF THE LEFT HEPATIC ARTERY 6. SUB SELECTIVE ARTERIOGRAM OF THE MEDIAL SEGMENTAL DIVISION THE LEFT HEPATIC ARTERY AND FLUOROSCOPIC GUIDED ADMINISTRATION OF SIR-SPHERES COMPARISON:  Mapping Y 90 radioembolization-07/17/2018; CT abdomen and pelvis-06/18/2018; abdominal MRI-06/15/2018; PET-CT-02/15/2018 MEDICATIONS: Protonix 40 mg IV; Decadron 20 mg IV; Toradol 30 mg IV; Zofran 4 mg IV CONTRAST:  60 cc Omnipaque 300 ANESTHESIA/SEDATION: Moderate (conscious) sedation was employed during this procedure. A total of Versed 4 mg and Fentanyl 100 mcg was administered intravenously. Moderate Sedation Time: 83 minutes. The patient's level of consciousness and vital signs were monitored continuously by radiology nursing throughout the procedure under my direct supervision. FLUOROSCOPY TIME:  19 minutes, 36 seconds (1,644 mGy) ACCESS: Right common femoral artery; hemostasis achieved with manual compression. COMPLICATIONS: None immediate. TECHNIQUE: Informed written consent was obtained from the patient after a discussion of the risks, benefits and alternatives to treatment. Questions regarding the procedure were encouraged and answered. A timeout was performed prior to the initiation of the procedure. The right groin was prepped and draped in the usual sterile fashion, and a sterile drape was applied  covering the operative field. Maximum barrier sterile technique with sterile gowns and gloves were used for the procedure. A timeout was performed prior to the initiation of the procedure. Local anesthesia was provided with 1% lidocaine. The right femoral head was marked fluoroscopically. Under direct ultrasound  guidance, the right common femoral artery was accessed with a micropuncture kit allowing placement of a of 5-French vascular sheath. An ultrasound image was saved for documentation purposes. A limited arteriogram performed through the side arm of the sheath confirming appropriate access within the right common femoral artery. Over a Britta Mccreedy wire, a Mickelson catheter was advanced to the level of the inferior thoracic aorta where it was reformed, back bled and flushed. The Mickelson catheter was utilized to select the celiac artery and a celiac arteriogram was performed. Next, a stiff glidewire was advanced into the main trunk of the celiac artery. Under imaging fluoroscopic guidance, the Mickelson catheter was exchanged for a Kumpe catheter which was utilized to select the common hepatic artery and a selective common hepatic arteriogram was performed. The Kumpe catheter was advanced to near the origin of the takeoff of the left hepatic artery. Next, a fathom 14 microwire was utilized to manipulate a high-flow Renegade microcatheter into the left hepatic artery and a selective left hepatic arteriogram. The microcatheter was advanced into the lateral segmental division of the left hepatic artery and a sub selective arteriogram was performed. With some difficulty, the microcatheter was ultimately utilized select the medial segmental division of the left hepatic artery fair to supply the ill-defined hypoattenuating lesion within caudate and a sub selective arteriogram was performed. Radioembolization was then performed with Yttrium-90 SIR Spheres. Particles were administered via a microcatheter utilizing a  completely enclosed system. Monitoring of antegrade flow was performed during and after the administration under fluoroscopy with use of contrast intermittently. After the administration of the particles, the microcatheter, outer catheter and administration system were then discarded into radiation safety receptacle. At this point, the procedure was terminated. The right common femoral approach vascular sheath was removed and hemostasis was achieved with manual compression. A dressing was placed. The patient tolerated procedure well without immediate postprocedural complication. All participants involved with the procedure were surveyed by the radiation safety officer prior to exiting the fluoroscopy suite. The patient was escorted to the nuclear medicine department for post-treatment imaging. FINDINGS: Superior mesenteric arteriogram demonstrates near complete occlusion of the GDA with minimal late phase delayed filling distally. Selective common hepatic arteriogram confirms this finding as well as adequately demonstrates takeoff of the left hepatic artery. Sub selective arteriogram of the lateral segmental division of the left hepatic artery was performed and is again negative for definitive extra hepatic vascular supply. The catheter was ultimately utilized to select the medial segmental division of the left hepatic artery and a sub selective arteriogram was performed. There was an ill-defined area of relative oligemia overlying expected location of the caudate lobe which may represent the ill-defined hypo hepatic metastasis. Intermittent contrast injections during and after the radioembolization confirming preserved antegrade flow though note was made of a small amount of reflux into the medial segmental division of the left hepatic artery. IMPRESSION: Technically successful radioembolization of the medial segmental division of the left hepatic artery with Yttrium-90 microspheres. PLAN: - will obtain  postprocedural CMP in 3 weeks to ensure adequate recovery from the Y 90 radioembolization. - this will be followed by consultation in the interventional radiology clinic, either in person or via tele conference, at which time we will discuss whether it is prudent to proceed with Y 90 radioembolization of the right lobe of the liver versus continued conservative management. Electronically Signed   By: Sandi Mariscal M.D.   On: 07/30/2018 14:43   Ir Embo Tumor Organ Ischemia Infarct Inc Guide Roadmapping  Result Date: 07/30/2018 INDICATION: History of metastatic colon cancer. Patient presents today for Y 90 radioembolization of the medial segmental branch of the left hepatic artery supplying the hypermetabolic metastasis within the caudate lobe. EXAM: 1. ULTRASOUND GUIDANCE FOR ARTERIAL ACCESS 2. SELECTIVE CELIAC ARTERIOGRAM 3. SELECTIVE COMMON HEPATIC ARTERIOGRAM 4. SUB SELECTIVE LEFT HEPATIC ARTERIOGRAM 5. SUB SELECTIVE ARTERIOGRAM OF THE LATERAL SEGMENTAL DIVISION OF THE LEFT HEPATIC ARTERY 6. SUB SELECTIVE ARTERIOGRAM OF THE MEDIAL SEGMENTAL DIVISION THE LEFT HEPATIC ARTERY AND FLUOROSCOPIC GUIDED ADMINISTRATION OF SIR-SPHERES COMPARISON:  Mapping Y 90 radioembolization-07/17/2018; CT abdomen and pelvis-06/18/2018; abdominal MRI-06/15/2018; PET-CT-02/15/2018 MEDICATIONS: Protonix 40 mg IV; Decadron 20 mg IV; Toradol 30 mg IV; Zofran 4 mg IV CONTRAST:  60 cc Omnipaque 300 ANESTHESIA/SEDATION: Moderate (conscious) sedation was employed during this procedure. A total of Versed 4 mg and Fentanyl 100 mcg was administered intravenously. Moderate Sedation Time: 83 minutes. The patient's level of consciousness and vital signs were monitored continuously by radiology nursing throughout the procedure under my direct supervision. FLUOROSCOPY TIME:  19 minutes, 36 seconds (1,644 mGy) ACCESS: Right common femoral artery; hemostasis achieved with manual compression. COMPLICATIONS: None immediate. TECHNIQUE: Informed written  consent was obtained from the patient after a discussion of the risks, benefits and alternatives to treatment. Questions regarding the procedure were encouraged and answered. A timeout was performed prior to the initiation of the procedure. The right groin was prepped and draped in the usual sterile fashion, and a sterile drape was applied covering the operative field. Maximum barrier sterile technique with sterile gowns and gloves were used for the procedure. A timeout was performed prior to the initiation of the procedure. Local anesthesia was provided with 1% lidocaine. The right femoral head was marked fluoroscopically. Under direct ultrasound guidance, the right common femoral artery was accessed with a micropuncture kit allowing placement of a of 5-French vascular sheath. An ultrasound image was saved for documentation purposes. A limited arteriogram performed through the side arm of the sheath confirming appropriate access within the right common femoral artery. Over a Britta Mccreedy wire, a Mickelson catheter was advanced to the level of the inferior thoracic aorta where it was reformed, back bled and flushed. The Mickelson catheter was utilized to select the celiac artery and a celiac arteriogram was performed. Next, a stiff glidewire was advanced into the main trunk of the celiac artery. Under imaging fluoroscopic guidance, the Mickelson catheter was exchanged for a Kumpe catheter which was utilized to select the common hepatic artery and a selective common hepatic arteriogram was performed. The Kumpe catheter was advanced to near the origin of the takeoff of the left hepatic artery. Next, a fathom 32 microwire was utilized to manipulate a high-flow Renegade microcatheter into the left hepatic artery and a selective left hepatic arteriogram. The microcatheter was advanced into the lateral segmental division of the left hepatic artery and a sub selective arteriogram was performed. With some difficulty, the  microcatheter was ultimately utilized select the medial segmental division of the left hepatic artery fair to supply the ill-defined hypoattenuating lesion within caudate and a sub selective arteriogram was performed. Radioembolization was then performed with Yttrium-90 SIR Spheres. Particles were administered via a microcatheter utilizing a completely enclosed system. Monitoring of antegrade flow was performed during and after the administration under fluoroscopy with use of contrast intermittently. After the administration of the particles, the microcatheter, outer catheter and administration system were then discarded into radiation safety receptacle. At this point, the procedure was terminated. The right common femoral approach vascular sheath  was removed and hemostasis was achieved with manual compression. A dressing was placed. The patient tolerated procedure well without immediate postprocedural complication. All participants involved with the procedure were surveyed by the radiation safety officer prior to exiting the fluoroscopy suite. The patient was escorted to the nuclear medicine department for post-treatment imaging. FINDINGS: Superior mesenteric arteriogram demonstrates near complete occlusion of the GDA with minimal late phase delayed filling distally. Selective common hepatic arteriogram confirms this finding as well as adequately demonstrates takeoff of the left hepatic artery. Sub selective arteriogram of the lateral segmental division of the left hepatic artery was performed and is again negative for definitive extra hepatic vascular supply. The catheter was ultimately utilized to select the medial segmental division of the left hepatic artery and a sub selective arteriogram was performed. There was an ill-defined area of relative oligemia overlying expected location of the caudate lobe which may represent the ill-defined hypo hepatic metastasis. Intermittent contrast injections during and after  the radioembolization confirming preserved antegrade flow though note was made of a small amount of reflux into the medial segmental division of the left hepatic artery. IMPRESSION: Technically successful radioembolization of the medial segmental division of the left hepatic artery with Yttrium-90 microspheres. PLAN: - will obtain postprocedural CMP in 3 weeks to ensure adequate recovery from the Y 90 radioembolization. - this will be followed by consultation in the interventional radiology clinic, either in person or via tele conference, at which time we will discuss whether it is prudent to proceed with Y 90 radioembolization of the right lobe of the liver versus continued conservative management. Electronically Signed   By: Sandi Mariscal M.D.   On: 07/30/2018 14:43   Nm Radio Pharm Therapy Intraarterial  Result Date: 07/30/2018 CLINICAL DATA:  Metastatic colon cancer. Unresectable liver metastasis. Status post percutaneous micro wave ablation of lesions within caudate and subcapsular aspect of right lobe of liver. First treatment to left lobe. EXAM: NUCLEAR MEDICINE SPECIAL MED RAD PHYSICS CONS; NUCLEAR MEDICINE RADIO PHARM THERAPY INTRA ARTERIAL; NUCLEAR MEDICINE TREATMENT PROCEDURE; NUCLEAR MEDICINE LIVER SCAN TECHNIQUE: In conjunction with the interventional radiologist a Y- Microsphere dose was calculated utilizing body surface area formulation. Calculated dose equal 13.5 mCi. Pre therapy MAA liver SPECT scan and CTA were evaluated. Utilizing a microcatheter system, the hepatic artery was selected and Y-90 microspheres were delivered in fractionated aliquots. Radiopharmaceutical was delivered by the interventional radiologist and nuclear radiologist. The patient tolerated procedure well. No adverse effects were noted. Bremsstrahlung planar and SPECT imaging of the abdomen following intrahepatic arterial delivery of Y-90 microsphere was performed. RADIOPHARMACEUTICALS:  9.7 millicuries Y- 90 microspheres  COMPARISON:  CT 06/18/2018. FINDINGS: Y - 90 microspheres therapy as above. First therapy the right hepatic lobe. Bremsstrahlung planar and SPECT imaging of the abdomen following intrahepatic arterial delivery of Y-90 microsphere demonstrates radioactivity localized to the left hepatic lobe. No evidence of extrahepatic activity. IMPRESSION: Successful Y - 90 microsphere delivery for treatment of unresectable liver metastasis. First therapy to the left lobe. Bremssstrahlung scan demonstrates activity localized to left hepatic lobe with no extrahepatic activity identified. Electronically Signed   By: Kerby Moors M.D.   On: 07/30/2018 14:18    Labs:  CBC: Recent Labs    05/30/18 0930 06/29/18 0935 07/17/18 0847 07/30/18 0826  WBC 6.7 6.2 6.6 6.3  HGB 13.4 12.6* 13.2 12.8*  HCT 41.3 39.4 41.9 40.9  PLT 145* 135* 124* 128*    COAGS: Recent Labs    10/27/17 0651 11/22/17 0746 07/17/18 0847  07/30/18 0826  INR 1.51 1.07 1.2 1.1  APTT  --   --  41*  --     BMP: Recent Labs    05/30/18 0930 06/29/18 0935 07/17/18 0847 07/30/18 0826  NA 142 141 140 143  K 4.5 3.8 3.7 3.7  CL 105 107 104 107  CO2 '31 27 26 26  ' GLUCOSE 130* 112* 106* 108*  BUN 23 26* 28* 22  CALCIUM 9.3 9.5 9.0 8.6*  CREATININE 1.26* 1.25* 1.36* 1.36*  GFRNONAA 57* 58* 52* 52*  GFRAA >60 >60 >60 >60    LIVER FUNCTION TESTS: Recent Labs    05/30/18 0930 06/29/18 0935 07/17/18 0847 07/30/18 0826  BILITOT 0.7 0.8 1.1 0.8  AST '19 20 22 23  ' ALT '15 14 19 22  ' ALKPHOS 90 84 89 88  PROT 7.0 6.4* 6.9 6.6  ALBUMIN 4.0 3.7 4.1 3.8    TUMOR MARKERS: No results for input(s): AFPTM, CEA, CA199, CHROMGRNA in the last 8760 hours.  Assessment and Plan:  Colon cancer with metastatic disease to the liver with recent Y90 of the medial segmental artery of the left lobe of the liver.  Will proceed with with Y90 radioembolization of the lesion in the Right lobe of the liver today by Dr. Pascal Lux.  Risks and benefits  discussed with the patient including, but not limited to bleeding, infection, vascular injury, post procedural pain, nausea, vomiting and fatigue, contrast induced renal failure, liver failure, radiation injury to the bowel, radiation induced cholecystitis, neutropenia and possible need for additional procedures.  All of the patient's questions were answered, patient is agreeable to proceed. Consent signed and in chart.  Thank you for this interesting consult.  I greatly enjoyed meeting Zachary Willis and look forward to participating in their care.  A copy of this report was sent to the requesting provider on this date.  Electronically Signed: Murrell Redden, PA-C   08/27/2018, 8:47 AM      I spent a total of  25 Minutes in face to face in clinical consultation, greater than 50% of which was counseling/coordinating care for Y90 metastatic lesion in the right lobe of the liver.

## 2018-08-27 NOTE — Discharge Instructions (Signed)
Moderate Conscious Sedation, Adult, Care After These instructions provide you with information about caring for yourself after your procedure. Your health care provider may also give you more specific instructions. Your treatment has been planned according to current medical practices, but problems sometimes occur. Call your health care provider if you have any problems or questions after your procedure. What can I expect after the procedure? After your procedure, it is common:  To feel sleepy for several hours.  To feel clumsy and have poor balance for several hours.  To have poor judgment for several hours.  To vomit if you eat too soon. Follow these instructions at home: For at least 24 hours after the procedure:   Do not: ? Participate in activities where you could fall or become injured. ? Drive. ? Use heavy machinery. ? Drink alcohol. ? Take sleeping pills or medicines that cause drowsiness. ? Make important decisions or sign legal documents. ? Take care of children on your own.  Rest. Eating and drinking  Follow the diet recommended by your health care provider.  If you vomit: ? Drink water, juice, or soup when you can drink without vomiting. ? Make sure you have little or no nausea before eating solid foods. General instructions  Have a responsible adult stay with you until you are awake and alert.  Take over-the-counter and prescription medicines only as told by your health care provider.  If you smoke, do not smoke without supervision.  Keep all follow-up visits as told by your health care provider. This is important. Contact a health care provider if:  You keep feeling nauseous or you keep vomiting.  You feel light-headed.  You develop a rash.  You have a fever. Get help right away if:  You have trouble breathing. This information is not intended to replace advice given to you by your health care provider. Make sure you discuss any questions you have  with your health care provider. Document Released: 10/10/2012 Document Revised: 12/02/2016 Document Reviewed: 04/11/2015 Elsevier Patient Education  2020 [place] Radioembolization Discharge Instructions  You have been given a radioactive material during your procedure.  While it is safe for you to be discharged home from the hospital, you need to proceed directly home.    Do not use public transportation, including air travel, lasting more than 2 hours for 1 week.  Avoid crowded public places for 1 week.  Adult visitors should try to avoid close contact with you for 1 week.    Children and pregnant females should not visit or have close contact with you for 1 week.  Items that you touch are not radioactive.  Do not sleep in the same bed as your partner for 1 week, and a condom should be used for sexual activity during the first 24 hours.  Your blood may be radioactive and caution should be used if any bleeding occurs during the recovery period.  Body fluids may be radioactive for 24 hours.  Wash your hands after voiding.  Men should sit to urinate.  Dispose of any soiled materials (flush down toilet or place in trash at home) during the first day.  Drink 6 to 8 glasses of fluids per day for 5 days to hydrate yourself.  If you need to see a doctor during the first week, you must let them know that you were treated with yttrium-90 microspheres, and will be slightly radioactive.  They can call Interventional Radiology [phone] with any questions.  Hepatic Artery Radioembolization, Care After This sheet gives you information about how to care for yourself after your procedure. Your health care provider may also give you more specific instructions. If you have problems or questions, contact your health care provider. What can I expect after the procedure? After the procedure, it is common to have:  A slight fever for 1-2 weeks. If your fever gets worse, tell your  health care provider.  Fatigue.  Loss of appetite. This should gradually improve after about 1 week.  Abdominal pain on your right side.  Soreness and tenderness in your groin area where the needle and catheter were placed (puncture site). Follow these instructions at home:  Puncture site care  Follow instructions from your health care provider about how to take care of the puncture site. Make sure you: ? Wash your hands with soap and water before you change your bandage (dressing). If soap and water are not available, use hand sanitizer. ? Change your dressing as told by your health care provider.  You may remove your dressing tomorrow. ? Leave stitches (sutures), skin glue, or adhesive strips in place. These skin closures may need to stay in place for 2 weeks or longer. If adhesive strip edges start to loosen and curl up, you may trim the loose edges. Do not remove adhesive strips completely unless your health care provider tells you to do that.  Check your puncture site every day for signs of infection. Check for: ? More redness, swelling, or pain. ? More fluid or blood. ? Warmth. ? Pus or a bad smell. Activity  Rest and return to your normal activities as told by your health care provider. Ask your health care provider what activities are safe for you.  Do not drive for 24 hours after the procedure if you were given a medicine to help you relax (sedative).  Do not lift anything that is heavier than 10 lb (4.5 kg) until your health care provider says that it is safe. Medicines  Take over-the-counter and prescription medicines only as told by your health care provider.  Do not drive or use heavy machinery while taking prescription pain medicine. Radiation precautions  For up to a week after your procedure, there will be a small amount of radioactivity near your liver. This is not especially dangerous to other people. However, you should follow these precautions for 7  days: ? Do not come in close contact with people. ? Do not sleep in the same bed as someone else. ? Do not hold children or babies. ? Do not have contact with pregnant women. General instructions  To prevent or treat constipation while you are taking prescription pain medicine, your health care provider may recommend that you: ? Drink enough fluid to keep your urine clear or pale yellow. ? Take over-the-counter or prescription medicines. ? Eat foods that are high in fiber, such as fresh fruits and vegetables, whole grains, and beans. ? Limit foods that are high in fat and processed sugars, such as fried and sweet foods.  Eat frequent small meals until your appetite returns. Follow instructions from your health care provider about eating or drinking restrictions.  Do not take baths, swim, or use a hot tub until your health care provider approves. You may take showers. Wash your puncture site with mild soap and water and pat the area dry.  You may shower tomorrow.  Wear compression stockings as told by your health care provider. These stockings help to prevent blood  clots and reduce swelling in your legs.  Keep all follow-up visits as told by your health care provider. This is important. You may need to have blood tests and imaging tests done. Contact a health care provider if:  You have more redness, swelling, or pain around your puncture site.  You have more fluid or blood coming from your puncture site.  Your puncture site feels warm to the touch.  You have pus or a bad smell coming from your puncture site.  You have pain that: ? Gets worse. ? Does not get better with medicine. ? Feels like very bad heartburn. ? Is in the middle of your abdomen, above your belly button.  Your skin and the white parts of your eyes turn yellow (jaundice).  The color of your urine changes to dark brown.  The color of your stool changes to light yellow.  Your abdominal measurement (girth)  increases in a short period of time.  You gain more than 5 lb (2.3 kg) in a short period of time. Get help right away if:  You have a fever that lasts longer than 2 weeks or is higher than what your health care provider told you to expect.  You develop any of the following in your legs: ? Pain. ? Swelling. ? Skin that is cold or pale or turns blue.  You have chest pain.  You have blood in your vomit, saliva, or stool.  You have trouble breathing. This information is not intended to replace advice given to you by your health care provider. Make sure you discuss any questions you have with your health care provider. Document Released: 12/25/2012 Document Revised: 12/02/2016 Document Reviewed: 09/19/2015 Elsevier Patient Education  2020 [employer].

## 2018-08-27 NOTE — Procedures (Signed)
Pre-procedure Diagnosis: Metastatic colon cancer Post-procedure Diagnosis: Same  Post Y-90 radioembolization of the right lobe of the liver.    Complications: None Immediate EBL: None  Keep right leg straight for 4 hrs (until 1430).    Signed: Sandi Mariscal Pager: [phone] 08/27/2018, 10:50 AM

## 2018-08-29 ENCOUNTER — Other Ambulatory Visit: Payer: Self-pay | Admitting: Interventional Radiology

## 2018-08-29 DIAGNOSIS — C787 Secondary malignant neoplasm of liver and intrahepatic bile duct: Secondary | ICD-10-CM

## 2018-08-29 DIAGNOSIS — C189 Malignant neoplasm of colon, unspecified: Secondary | ICD-10-CM

## 2018-08-31 ENCOUNTER — Ambulatory Visit: Payer: Medicare HMO | Admitting: Hematology & Oncology

## 2018-08-31 ENCOUNTER — Other Ambulatory Visit: Payer: Medicare HMO

## 2018-09-14 ENCOUNTER — Inpatient Hospital Stay (HOSPITAL_BASED_OUTPATIENT_CLINIC_OR_DEPARTMENT_OTHER): Payer: Medicare HMO | Admitting: Hematology & Oncology

## 2018-09-14 ENCOUNTER — Telehealth: Payer: Self-pay | Admitting: Hematology & Oncology

## 2018-09-14 ENCOUNTER — Other Ambulatory Visit: Payer: Self-pay

## 2018-09-14 ENCOUNTER — Inpatient Hospital Stay: Payer: Medicare HMO

## 2018-09-14 ENCOUNTER — Inpatient Hospital Stay: Payer: Medicare HMO | Attending: Hematology & Oncology

## 2018-09-14 ENCOUNTER — Encounter: Payer: Self-pay | Admitting: Hematology & Oncology

## 2018-09-14 DIAGNOSIS — Z79899 Other long term (current) drug therapy: Secondary | ICD-10-CM | POA: Diagnosis not present

## 2018-09-14 DIAGNOSIS — Z7901 Long term (current) use of anticoagulants: Secondary | ICD-10-CM | POA: Insufficient documentation

## 2018-09-14 DIAGNOSIS — C787 Secondary malignant neoplasm of liver and intrahepatic bile duct: Secondary | ICD-10-CM | POA: Diagnosis not present

## 2018-09-14 DIAGNOSIS — C189 Malignant neoplasm of colon, unspecified: Secondary | ICD-10-CM | POA: Insufficient documentation

## 2018-09-14 LAB — CBC WITH DIFFERENTIAL (CANCER CENTER ONLY)
Abs Immature Granulocytes: 0.03 10*3/uL (ref 0.00–0.07)
Basophils Absolute: 0.1 10*3/uL (ref 0.0–0.1)
Basophils Relative: 1 %
Eosinophils Absolute: 0.4 10*3/uL (ref 0.0–0.5)
Eosinophils Relative: 7 %
HCT: 42.3 % (ref 39.0–52.0)
Hemoglobin: 13.7 g/dL (ref 13.0–17.0)
Immature Granulocytes: 1 %
Lymphocytes Relative: 12 %
Lymphs Abs: 0.8 10*3/uL (ref 0.7–4.0)
MCH: 28.7 pg (ref 26.0–34.0)
MCHC: 32.4 g/dL (ref 30.0–36.0)
MCV: 88.5 fL (ref 80.0–100.0)
Monocytes Absolute: 0.6 10*3/uL (ref 0.1–1.0)
Monocytes Relative: 9 %
Neutro Abs: 4.5 10*3/uL (ref 1.7–7.7)
Neutrophils Relative %: 70 %
Platelet Count: 133 10*3/uL — ABNORMAL LOW (ref 150–400)
RBC: 4.78 MIL/uL (ref 4.22–5.81)
RDW: 15.7 % — ABNORMAL HIGH (ref 11.5–15.5)
WBC Count: 6.4 10*3/uL (ref 4.0–10.5)
nRBC: 0 % (ref 0.0–0.2)

## 2018-09-14 LAB — CMP (CANCER CENTER ONLY)
ALT: 55 U/L — ABNORMAL HIGH (ref 0–44)
AST: 52 U/L — ABNORMAL HIGH (ref 15–41)
Albumin: 3.7 g/dL (ref 3.5–5.0)
Alkaline Phosphatase: 186 U/L — ABNORMAL HIGH (ref 38–126)
Anion gap: 7 (ref 5–15)
BUN: 18 mg/dL (ref 8–23)
CO2: 29 mmol/L (ref 22–32)
Calcium: 9.2 mg/dL (ref 8.9–10.3)
Chloride: 105 mmol/L (ref 98–111)
Creatinine: 1.17 mg/dL (ref 0.61–1.24)
GFR, Est AFR Am: >60 mL/min (ref >60)
GFR, Estimated: >60 mL/min (ref >60)
Glucose, Bld: 97 mg/dL (ref 70–99)
Potassium: 4 mmol/L (ref 3.5–5.1)
Sodium: 141 mmol/L (ref 135–145)
Total Bilirubin: 0.9 mg/dL (ref 0.3–1.2)
Total Protein: 6.3 g/dL — ABNORMAL LOW (ref 6.5–8.1)

## 2018-09-14 LAB — CEA (IN HOUSE-CHCC): CEA (CHCC-In House): 34.45 ng/mL — ABNORMAL HIGH (ref 0.00–5.00)

## 2018-09-14 LAB — LACTATE DEHYDROGENASE: LDH: 252 U/L — ABNORMAL HIGH (ref 98–192)

## 2018-09-14 NOTE — Patient Instructions (Signed)

## 2018-09-14 NOTE — Telephone Encounter (Signed)
lmom to inform patient of appts per 9/11 LOS

## 2018-09-14 NOTE — Progress Notes (Signed)
Hematology and Oncology Follow Up Visit  Zachary Willis [id number] 19-Aug-1945 73 y.o. 09/14/2018   Principle Diagnosis:   Metastatic colon cancer  Current Therapy:   Status post RFA to the liver-11/21/2017  S/P Y-90 x 2 -- last therapy on 08/27/2018    Interim History:  Zachary Willis is back for follow-up.  He really looks fantastic.  As expected, interventional radiology did a fantastic job with him.  He had the Y-90 therapy.  He had 2 infusions.  One was in July, and one was in August.  He did well with these.  His liver tests are still a little bit elevated.  He is totally asymptomatic.  His last CEA level back in July was 82.  He will be interesting to see what it is now.  His heart is doing well.  He is going to start cardiac rehab soon.  He has had no problems with anticoagulation.  He is on blood thinner.  He has had no problems with bleeding.  There is been no fever.  He has had no cough.  He has had no nausea or vomiting.  He has had no leg swelling.  Overall, his performance status is ECOG 1.    Medications:  Current Outpatient Medications:  .  allopurinol (ZYLOPRIM) 300 MG tablet, Take 300 mg by mouth every morning. , Disp: , Rfl:  .  atorvastatin (LIPITOR) 40 MG tablet, Take 40 mg by mouth daily., Disp: , Rfl:  .  carvedilol (COREG) 6.25 MG tablet, TAKE 1 TABLET (6.25 MG TOTAL) BY MOUTH 2 TIMES DAILY WITH MEALS., Disp: , Rfl:  .  [facility] ASPIRIN ADULT LOW DOSE 81 MG chewable tablet, Chew 81 mg by mouth daily., Disp: , Rfl:  .  [facility] STOOL SOFTENER/LAXATIVE 8.6-50 MG tablet, TAKE 2 TABLETS EVERY EVENING, Disp: , Rfl:  .  ferrous sulfate 325 (65 FE) MG tablet, Take 325 mg by mouth daily., Disp: , Rfl:  .  furosemide (LASIX) 20 MG tablet, Take 20 mg by mouth daily. , Disp: , Rfl:  .  hydrocortisone (CORTEF) 10 MG tablet, Take 5-15 mg by mouth See admin instructions. Takes 15 mg in am and 5mg  in afternoon., Disp: , Rfl: 4 .  levothyroxine (SYNTHROID, LEVOTHROID) 88 MCG tablet, Take 88  mcg by mouth daily before breakfast. , Disp: , Rfl:  .  loperamide (IMODIUM) 2 MG capsule, Take 2 mg by mouth as needed for diarrhea or loose stools. , Disp: , Rfl:  .  NASAL WASH NA, Place 1 spray into the nose daily. , Disp: , Rfl:  .  pantoprazole (PROTONIX) 40 MG tablet, Take 40 mg by mouth every morning. , Disp: , Rfl:  .  polyvinyl alcohol (LIQUIFILM TEARS) 1.4 % ophthalmic solution, Place 1 drop into both eyes daily as needed for dry eyes. , Disp: , Rfl:  .  potassium chloride (KLOR-CON 10) 10 MEQ tablet, Take 20 mEq by mouth daily., Disp: , Rfl:  .  prochlorperazine (COMPAZINE) 10 MG tablet, Take 10 mg by mouth every 8 (eight) hours as needed for nausea or vomiting. , Disp: , Rfl:  .  testosterone (ANDRODERM) 4 MG/24HR PT24 patch, Place 1 patch onto the skin daily. , Disp: , Rfl:  .  warfarin (COUMADIN) 5 MG tablet, Take 5 mg by mouth daily., Disp: , Rfl: 0  Allergies: No Known Allergies  Past Medical History, Surgical history, Social history, and Family History were reviewed and updated.  Review of Systems: Review of Systems  Constitutional: Negative.   HENT:  Negative.   Eyes: Negative.   Respiratory: Negative.   Cardiovascular: Negative.   Gastrointestinal: Negative.   Endocrine: Negative.   Genitourinary: Negative.    Musculoskeletal: Negative.   Skin: Negative.   Neurological: Negative.   Hematological: Negative.   Psychiatric/Behavioral: Negative.     Physical Exam:  vitals were not taken for this visit.   Wt Readings from Last 3 Encounters:  09/14/18 234 lb (106.1 kg)  07/30/18 237 lb (107.5 kg)  07/17/18 240 lb (108.9 kg)    Physical Exam Vitals signs reviewed.  HENT:     Head: Normocephalic and atraumatic.  Eyes:     Pupils: Pupils are equal, round, and reactive to light.  Neck:     Musculoskeletal: Normal range of motion.  Cardiovascular:     Rate and Rhythm: Normal rate and regular rhythm.     Heart sounds: Normal heart sounds.     Comments:  Regular rate and rhythm with an occasional extra beat. Pulmonary:     Effort: Pulmonary effort is normal.     Breath sounds: Normal breath sounds.  Abdominal:     General: Bowel sounds are normal.     Palpations: Abdomen is soft.     Comments: Abdomen is soft.  He has multiple laparotomy scars.  There is no fluid wave.  There is no palpable liver or spleen tip.  Musculoskeletal: Normal range of motion.        General: No tenderness or deformity.  Lymphadenopathy:     Cervical: No cervical adenopathy.  Skin:    General: Skin is warm and dry.     Findings: No erythema or rash.  Neurological:     Mental Status: He is alert and oriented to person, place, and time.  Psychiatric:        Behavior: Behavior normal.        Thought Content: Thought content normal.        Judgment: Judgment normal.      Lab Results  Component Value Date   WBC 6.4 09/14/2018   HGB 13.7 09/14/2018   HCT 42.3 09/14/2018   MCV 88.5 09/14/2018   PLT 133 (L) 09/14/2018     Chemistry      Component Value Date/Time   NA 142 08/27/2018 0833   K 3.6 08/27/2018 0833   CL 108 08/27/2018 0833   CO2 27 08/27/2018 0833   BUN 27 (H) 08/27/2018 0833   CREATININE 1.16 08/27/2018 0833   CREATININE 1.25 (H) 06/29/2018 0935      Component Value Date/Time   CALCIUM 8.8 (L) 08/27/2018 0833   ALKPHOS 95 08/27/2018 0833   AST 31 08/27/2018 0833   AST 20 06/29/2018 0935   ALT 29 08/27/2018 0833   ALT 14 06/29/2018 0935   BILITOT 1.1 08/27/2018 0833   BILITOT 0.8 06/29/2018 0935       Impression and Plan: Zachary Willis is a 73 year old white male with metastatic colon cancer.  I am so impressed with interventional radiology.  As always, they really did a top-notch job with him.  Hopefully, we will see that the Y-90 has helped.  I would not expect to have a scan done on him until November.  Zachary Willis says that radiology will order all of this.  We will make sure he comes back in 6 weeks for a Port-A-Cath flush.   I will see him back after Thanksgiving.  By then, he should have had his follow-up scan.  [name], MD 9/11/20209:52 AM

## 2018-09-19 ENCOUNTER — Ambulatory Visit
Admission: RE | Admit: 2018-09-19 | Discharge: 2018-09-19 | Disposition: A | Discharge status: Home / Self Care | Payer: Medicare HMO | Source: Ambulatory Visit | Attending: Interventional Radiology | Admitting: Interventional Radiology

## 2018-09-19 ENCOUNTER — Encounter: Payer: Self-pay | Admitting: *Deleted

## 2018-09-19 ENCOUNTER — Other Ambulatory Visit: Payer: Self-pay

## 2018-09-19 DIAGNOSIS — C189 Malignant neoplasm of colon, unspecified: Secondary | ICD-10-CM

## 2018-09-19 HISTORY — PX: IR RADIOLOGIST EVAL & MGMT: IMG5224

## 2018-09-19 MED ORDER — CARVEDILOL 3.125 MG PO TABS
6.25 | ORAL_TABLET | ORAL | Status: DC
Start: 2018-09-21 — End: 2018-09-19

## 2018-09-19 MED ORDER — HYDROCORTISONE 10 MG PO TABS
30.00 | ORAL_TABLET | ORAL | Status: DC
Start: 2018-09-22 — End: 2018-09-19

## 2018-09-19 MED ORDER — HYTAKEROL 0.125 MG PO CAPS
1.00 | ORAL_CAPSULE | ORAL | Status: DC
Start: 2018-09-19 — End: 2018-09-19

## 2018-09-19 MED ORDER — FP STAY AWAKE PO
20.00 | ORAL | Status: DC
Start: 2018-09-22 — End: 2018-09-19

## 2018-09-19 MED ORDER — BAYER WOMENS 81-300 MG PO TABS
40.00 | ORAL_TABLET | ORAL | Status: DC
Start: 2018-09-22 — End: 2018-09-19

## 2018-09-19 MED ORDER — CVS KIDPANT BOYS X-LARGE MISC
40.00 | Status: DC
Start: 2018-09-21 — End: 2018-09-19

## 2018-09-19 MED ORDER — AMERIFED DM PO
5.00 | ORAL | Status: DC
Start: 2018-09-19 — End: 2018-09-19

## 2018-09-19 MED ORDER — GENERIC EXTERNAL MEDICATION
15.00 | Status: DC
Start: 2018-09-19 — End: 2018-09-19

## 2018-09-19 MED ORDER — PHENYLEPH-POT GUAIACOLSULF
81.00 | Status: DC
Start: 2018-09-22 — End: 2018-09-19

## 2018-09-19 MED ORDER — CVS CHILDRENS NASAL DECONGEST 15 MG/5ML PO LIQD
88.00 | ORAL | Status: DC
Start: 2018-09-22 — End: 2018-09-19

## 2018-09-19 MED ORDER — DEODERANT PANTY SHIELDS PADS
5.00 | MEDICATED_PAD | Status: DC
Start: 2018-09-22 — End: 2018-09-19

## 2018-09-19 MED ORDER — Medication
3.38 | Status: DC
Start: 2018-09-19 — End: 2018-09-19

## 2018-09-19 MED ORDER — Medication
300.00 | Status: DC
Start: 2018-09-22 — End: 2018-09-19

## 2018-09-19 MED ORDER — QUINERVA 260 MG PO TABS
650.00 | ORAL_TABLET | ORAL | Status: DC
Start: ? — End: 2018-09-19

## 2018-09-19 NOTE — Progress Notes (Signed)
Patient ID: Zachary Willis, male   DOB: July 08, 1945, 73 y.o.   MRN: [id number]         Chief Complaint: Metastatic colon cancer, post Y-90 radioembolization  Referring Physician(s): Ennever  History of Present Illness: Zachary Willis is a 73 y.o. male with past medical history significant for atrial fibrillation(on Coumadin), pituitary tumor (post resection) with chronic steroid supplementation, hypertension and hypothyroidism who initially presented to the interventional radiology clinic on 10/10/2017 for evaluation of liver directed therapy for metastatic colon cancer.  Patient initially underwent image guided microwave ablation of two liver lesions on 11/22/2017.  Surveillance PET/CT performed 02/15/2018 demonstrated residual/recurrent hypermetabolic activity within the lesion within the central aspect of the caudate lobe without definitive activity associated with the ablated lesion with the peripheral aspect of the right lobe of the liver.  This finding is associated with significant reduction in the patient's CEA level which was 36 on 08/30/2017, reduced to 3.2 on February 17.  Given the COVID-19 pandemic the decision was made to continue with continued surveillance and dedicated intervention was not performed at that time.  Unfortunately, the patient's CEA level has risen since February, found to be 12.4 on April 30 and 18 on 5/27, which has prompted the acquisition of an abdominal MRI which was performed on 06/15/2018.  Additionally, I ordered a surveillance CT scan of the chest abdomen and pelvis which was performed on 06/18/2018.  Ultimately the decision was made to pursue Y 90 radioembolization with mapping Y 90 embolization performed on 07/17/2018, Y 90 embolization of the medial segmental division of the left lobe of the liver performed on 07/30/2018 and Y 90 embolization of the right lobe of the liver performed on 08/27/2018.  The patient was scheduled for a postprocedural telemedicine  visit today however unfortunately, the patient is currently admitted to the hospital with lower extremity cellulitis.  As such, a telemedicine consultation was held with the patient's wife.  The patient's wife states the patient had fully recovered from the completion Y 90 radioembolization of the right lobe of the liver approximately 5 days following the procedure.  Prior to this recent episode of cellulitis, the patient had returned to all activities of daily living and was in his baseline state of health.   Past Medical History:  Diagnosis Date   Anticoagulated on Coumadin    chronic long term   Antineoplastic chemotherapy induced anemia    Chronic atrial fibrillation    cardiologist--  dr Curly Rim [facility] in [place])--- hx DVVC in 2006/  TEE 10-21-2015 (care everywhere)  ef 55-60%, moderate dilated RA, mild MR, RVSP 67mHg   CKD (chronic kidney disease), stage II    Colon cancer metastasized to liver ([facility]    current oncologist-- [facility] eMarin Olp[facility]--- dx 12/ 2017,  Stage IIb ([id number]),  s/p  resection colon tumor 01-01-2016 ,  recurrent w/ liver mets via bx 08/ 2018  , had chemo then partial hepatectomy and completed chemo after surgery/ liver mets recurrence 08/ 2019   Cushing's disease ([facility]    endocriniologist-- dr eElisabeth Most([facility]   Droopy eyelid, right    11-15-2017   GERD (gastroesophageal reflux disease)    Goals of care, counseling/discussion 08/30/2017   Gout    11-15-2017 per pt last flare-up 2018   History of benign pituitary tumor    resection 07-25-2013   History of cancer chemotherapy    COMPLETED 02-20-2017  FOR  COLON CANCER WITH LIVER METS   History  of radiation therapy    03/ 2016  remainder of pituitary tumor   Hypertensive cardiovascular disease    Hypothyroidism, secondary    OA (osteoarthritis)    Secondary adrenal insufficiency ([place])    Secondary male hypogonadism    Wears glasses    Wears  partial dentures    upper and lower    Past Surgical History:  Procedure Laterality Date   COLOSTOMY TAKEDOWN  05-16-2016   _0    IR 3D INDEPENDENT WKST  07/17/2018   IR ANGIOGRAM SELECTIVE EACH ADDITIONAL VESSEL  07/17/2018   IR ANGIOGRAM SELECTIVE EACH ADDITIONAL VESSEL  07/17/2018   IR ANGIOGRAM SELECTIVE EACH ADDITIONAL VESSEL  07/17/2018   IR ANGIOGRAM SELECTIVE EACH ADDITIONAL VESSEL  07/17/2018   IR ANGIOGRAM SELECTIVE EACH ADDITIONAL VESSEL  07/17/2018   IR ANGIOGRAM SELECTIVE EACH ADDITIONAL VESSEL  07/30/2018   IR ANGIOGRAM SELECTIVE EACH ADDITIONAL VESSEL  07/30/2018   IR ANGIOGRAM SELECTIVE EACH ADDITIONAL VESSEL  07/30/2018   IR ANGIOGRAM SELECTIVE EACH ADDITIONAL VESSEL  07/30/2018   IR ANGIOGRAM SELECTIVE EACH ADDITIONAL VESSEL  07/30/2018   IR ANGIOGRAM SELECTIVE EACH ADDITIONAL VESSEL  08/27/2018   IR ANGIOGRAM SELECTIVE EACH ADDITIONAL VESSEL  08/27/2018   IR ANGIOGRAM VISCERAL SELECTIVE  07/17/2018   IR ANGIOGRAM VISCERAL SELECTIVE  07/17/2018   IR ANGIOGRAM VISCERAL SELECTIVE  08/27/2018   IR EMBO ARTERIAL NOT HEMORR HEMANG INC GUIDE ROADMAPPING  07/17/2018   IR EMBO TUMOR ORGAN ISCHEMIA INFARCT INC GUIDE ROADMAPPING  07/30/2018   IR EMBO TUMOR ORGAN ISCHEMIA INFARCT INC GUIDE ROADMAPPING  08/27/2018   IR RADIOLOGIST EVAL & MGMT  10/10/2017   IR RADIOLOGIST EVAL & MGMT  11/07/2017   IR RADIOLOGIST EVAL & MGMT  12/21/2017   IR RADIOLOGIST EVAL & MGMT  06/19/2018   IR US GUIDE VASC ACCESS RIGHT  07/17/2018   IR US GUIDE VASC ACCESS RIGHT  07/30/2018   IR US GUIDE VASC ACCESS RIGHT  08/27/2018   OPEN PARTIAL HEPATECTOMY   01-13-2017    _1    AND CHOLECYSTECTOMY   RADIOFREQUENCY ABLATION N/A 11/22/2017   Procedure: CT MICROWAVE ABLATION LIVER;  Surgeon: Sandi Mariscal, MD;  Location: [facility];  Service: Anesthesiology;  Laterality: N/A;   REVISION TOTAL KNEE ARTHROPLASTY Left 12/20/2013   SUBTOTAL COLECTOMY  01-01-2016  _2    W/  CREATION ILEOSTOMY AND  UMBILICAL HERNIA REPAIR   TOTAL KNEE ARTHROPLASTY Bilateral left 11-16-2010;  right 10-26-2011   TRANSPHENOIDAL PITUITARY RESECTION  07-25-2013   _3 -[facility]    Allergies: Patient has no known allergies.  Medications: Prior to Admission medications   Medication Sig Start Date End Date Taking? Authorizing Provider  allopurinol (ZYLOPRIM) 300 MG tablet Take 300 mg by mouth every morning.  01/28/10   [provider]  atorvastatin (LIPITOR) 40 MG tablet Take 40 mg by mouth daily. 03/22/18   [provider]  carvedilol (COREG) 6.25 MG tablet TAKE 1 TABLET (6.25 MG TOTAL) BY MOUTH 2 TIMES DAILY WITH MEALS. 05/31/18   [provider]  [facility] ASPIRIN ADULT LOW DOSE 81 MG chewable tablet Chew 81 mg by mouth daily. 02/07/18   [provider]  [employer] STOOL SOFTENER/LAXATIVE 8.6-50 MG tablet TAKE 2 TABLETS EVERY EVENING 03/22/18   [provider]  ferrous sulfate 325 (65 FE) MG tablet Take 325 mg by mouth daily.    [provider]  furosemide (LASIX) 20 MG tablet Take 20 mg by mouth daily.     [provider]  hydrocortisone (CORTEF) 10 MG  tablet Take 5-15 mg by mouth See admin instructions. Takes 15 mg in am and 15m in afternoon. 08/19/17   [provider]  levothyroxine (SYNTHROID, LEVOTHROID) 88 MCG tablet Take 88 mcg by mouth daily before breakfast.  05/15/17   [provider]  loperamide (IMODIUM) 2 MG capsule Take 2 mg by mouth as needed for diarrhea or loose stools.     [provider]  NASAL [facility] Place 1 spray into the nose daily.     [provider]  pantoprazole (PROTONIX) 40 MG tablet Take 40 mg by mouth every morning.  05/16/14   [provider]  polyvinyl alcohol (LIQUIFILM TEARS) 1.4 % ophthalmic solution Place 1 drop into both eyes daily as needed for dry eyes.     [provider]  potassium chloride (KLOR-CON 10) 10 MEQ tablet Take 20 mEq by mouth daily. 11/17/14   [provider]  prochlorperazine (COMPAZINE) 10 MG tablet Take 10 mg by mouth every 8 (eight) hours as needed for nausea or vomiting.  09/01/16   [provider]  testosterone (ANDRODERM) 4 MG/24HR PT24 patch Place 1 patch onto the skin daily.  04/17/15   [provider]  warfarin (COUMADIN) 5 MG tablet Take 5 mg by mouth daily. 07/05/17   [provider]     No family history on file.  Social History   Socioeconomic History   Marital status: Married    Spouse name: Not on file   Number of children: Not on file   Years of education: Not on file   Highest education level: Not on file  Occupational History   Not on file  Social Needs   Financial resource strain: Not on file   Food insecurity    Worry: Not on file    Inability: Not on file   Transportation needs    Medical: Not on file    Non-medical: Not on file  Tobacco Use   Smoking status: Never Smoker   Smokeless tobacco: Never Used  Substance and Sexual Activity   Alcohol use: Not Currently   Drug use: Never   Sexual activity: Not on file  Lifestyle   Physical activity    Days per week: Not on file    Minutes per session: Not on file   Stress: Not on file  Relationships   Social connections    Talks on phone: Not on file    Gets together: Not on file    Attends religious service: Not on file    Active member of club or organization: Not on file    Attends meetings of clubs or organizations: Not on file    Relationship status: Not on file  Other Topics Concern   Not on file  Social History Narrative   Not on file    ECOG Status: 1 - Symptomatic but completely ambulatory  Review of Systems  Review of Systems: A 12 point ROS discussed and pertinent positives are indicated in the HPI above.  All other systems are negative.  Physical Exam No direct physical exam was performed (except for noted visual exam findings with Video Visits).   Vital Signs: There were no vitals taken  for this visit.  Imaging: Nm Liver Tumor Loc Imflam Spect 1 Day  Result Date: 08/27/2018 CLINICAL DATA:  Unresectable hepatic metastasis from colorectal carcinoma primary. Prior ablation to the RIGHT hepatic lobe. Prior yttrium 90 radio embolization in the subsegmental LEFT hepatic lobe with 9.7 millicuries. EXAM: NUCLEAR  MEDICINE SPECIAL MED RAD PHYSICS CONS; NUCLEAR MEDICINE RADIO PHARM THERAPY INTRA ARTERIAL; NUCLEAR MEDICINE TREATMENT PROCEDURE; NUCLEAR MEDICINE LIVER SCAN TECHNIQUE: In conjunction with the interventional radiologist a Y- Microsphere dose was calculated utilizing body surface area formulation. Calculated dose equal 30.1 mCi. Pre therapy MAA liver SPECT scan and CTA were evaluated. Utilizing a microcatheter system, the hepatic artery was selected and Y-90 microspheres were delivered in fractionated aliquots. Radiopharmaceutical was delivered by the interventional radiologist and nuclear radiologist. The patient tolerated procedure well. No adverse effects were noted. Bremsstrahlung planar and SPECT imaging of the abdomen following intrahepatic arterial delivery of Y-90 microsphere was performed. RADIOPHARMACEUTICALS:  30.6 microcuries yttrium 90 microspheres COMPARISON:  MAA scan 07/17/2018, MR 06/15/2018 FINDINGS: Y - 90 microspheres therapy as above. First therapy to the right hepatic lobe (15.1 millicuries). Prior therapy to the LEFT hepatic lobe (9.7 millicuries) on 76/16/0737. Bremsstrahlung planar and SPECT imaging of the abdomen following intrahepatic arterial delivery of Y-44mcrosphere demonstrates radioactivity localized to the RIGHT hepatic lobe. No evidence of extrahepatic activity. IMPRESSION: Successful Y - 90 microsphere delivery for treatment of unresectable liver metastasis. First therapy to the RIGHT lobe. Bremssstrahlung scan demonstrates activity localized to RIGHT hepatic lobe with no extrahepatic activity identified. Electronically Signed   By: SSuzy BouchardM.D.    On: 08/27/2018 12:48   Ir Angiogram Visceral Selective  Result Date: 08/27/2018 INDICATION: History metastatic colon cancer. Patient presents now for Y 90 radioembolization of the right lobe of the liver after undergoing Y 90 radioembolization the medial segmental branch of the left lobe of the liver 07/30/2018. EXAM: 1. ULTRASOUND GUIDANCE FOR ARTERIAL ACCESS. 2. CELIAC ARTERIOGRAM 3. SELECTIVE COMMON HEPATIC ARTERIOGRAM 4. SELECTIVE RIGHT HEPATIC ARTERIOGRAM AND FLUOROSCOPIC GUIDED ADMINISTRATION OF SIR-SPHERES COMPARISON:  Mapping Y 90 radioembolization and postprocedural nuclear medicine imaging-07/17/2018; Y90 radioembolization the left lobe of the liver and postprocedural nuclear medicine-07/30/2018; PET-CT-02/15/2018; CT abdomen pelvis-06/18/2018 MEDICATIONS: Protonix 40 mg IV; Decadron 20 mg IV; Toradol 30 mg IV; Zofran 4 mg IV CONTRAST:  555mOMNIPAQUE IOHEXOL 300 MG/ML  SOLN ANESTHESIA/SEDATION: Moderate (conscious) sedation was employed during this procedure. A total of Versed 2 mg and Fentanyl 100 mcg was administered intravenously. Moderate Sedation Time: 55 minutes. The patient's level of consciousness and vital signs were monitored continuously by radiology nursing throughout the procedure under my direct supervision. FLUOROSCOPY TIME:  5 minutes, 48 seconds (497 mGy) ACCESS: Right common femoral artery; hemostasis achieved with manual compression. COMPLICATIONS: None immediate. TECHNIQUE: Informed written consent was obtained from the patient after a discussion of the risks, benefits and alternatives to treatment. Questions regarding the procedure were encouraged and answered. A timeout was performed prior to the initiation of the procedure. The right groin was prepped and draped in the usual sterile fashion, and a sterile drape was applied covering the operative field. Maximum barrier sterile technique with sterile gowns and gloves were used for the procedure. A timeout was performed prior to  the initiation of the procedure. Local anesthesia was provided with 1% lidocaine. The right femoral head was marked fluoroscopically. Under direct ultrasound guidance, the right common femoral artery was accessed with a micropuncture kit allowing placement of a of 5-French vascular sheath. An ultrasound image was saved for documentation purposes. A limited arteriogram performed through the side arm of the sheath confirming appropriate access within the right common femoral artery. Over a BeBritta Mccreedyire, a Mickelson catheter was advanced to the level of the inferior thoracic aorta where it was reformed, back bled and flushed. The [facility]  was utilized to select the celiac artery and a celiac arteriogram was performed. Next, a stiff glidewire was advanced into the proximal aspect of the splenic artery. Under intermittent fluoroscopic guidance, the Mickelson catheter was exchanged for a Kumpe catheter which was advanced into the proximal aspect of the celiac artery. Next, with the use of a stiff Glidewire, the Kumpe catheter was advanced into the common hepatic artery and a common hepatic arteriogram was performed. With the use of a fathom 14 microwire, a high-flow Renegade microcatheter was advanced into the right hepatic artery and a selective right hepatic arteriogram was performed. Appropriate positioning for radioembolization was selected and then performed with Yttrium-90 SIR Spheres. Particles were administered via a microcatheter utilizing a completely enclosed system. Monitoring of antegrade flow was performed during and after the administration under fluoroscopy with use of contrast intermittently. After the administration of the particles, the microcatheter, outer catheter and administration system were then discarded into radiation safety receptacle. At this point, the procedure was terminated. The right common femoral approach vascular sheath was removed and hemostasis was achieved with manual  compression. A dressing was placed. The patient tolerated procedure well without immediate postprocedural complication. All participants involved with the procedure were surveyed by the radiation safety officer prior to exiting the fluoroscopy suite. The patient was escorted to the nuclear medicine department for post-treatment imaging. 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 Selective right hepatic arteriogram demonstrated development of several ill-defined lesions within both the anterior and posterior segments of the right lobe of the liver worrisome for metastatic disease (best seen on series 6) and likely progressed compared to abdominal MRI performed 06/15/2018. Technically successful administration of Y 90 radioembolization from the right lobe of the liver proximal to the vessel's bifurcation. Intermittent contrast injections during and after the radioembolization confirming preserved antegrade flow without reflux. IMPRESSION: Technically successful radioembolization of the right lobe of the liver with Yttrium-90 microspheres. PLAN: - The patient be seen in follow-up consultation (either in person or via tele medicine) and 1 month following the acquisition of a postprocedural CMP. - Initial postprocedural surveillance imaging will be performed in 3 months (late November/early December) likely initially with PET-CT given concern for disease progression on today's angiogram. Electronically Signed   By: Sandi Mariscal M.D.   On: 08/27/2018 12:54   Ir Angiogram Selective Each Additional Vessel  Result Date: 08/27/2018 INDICATION: History metastatic colon cancer. Patient presents now for Y 90 radioembolization of the right lobe of the liver after  undergoing Y 90 radioembolization the medial segmental branch of the left lobe of the liver 07/30/2018. EXAM: 1. ULTRASOUND GUIDANCE FOR ARTERIAL ACCESS. 2. CELIAC ARTERIOGRAM 3. SELECTIVE COMMON HEPATIC ARTERIOGRAM 4. SELECTIVE RIGHT HEPATIC ARTERIOGRAM AND FLUOROSCOPIC GUIDED ADMINISTRATION OF SIR-SPHERES COMPARISON:  Mapping Y 90 radioembolization and postprocedural nuclear medicine imaging-07/17/2018; Y90 radioembolization the left lobe of the liver and postprocedural nuclear medicine-07/30/2018; PET-CT-02/15/2018; CT abdomen pelvis-06/18/2018 MEDICATIONS: Protonix 40 mg IV; Decadron 20 mg IV; Toradol 30 mg IV; Zofran 4 mg IV CONTRAST:  66m OMNIPAQUE IOHEXOL 300 MG/ML  SOLN ANESTHESIA/SEDATION: Moderate (conscious) sedation was employed during this procedure. A total of Versed 2 mg and Fentanyl 100 mcg was administered intravenously. Moderate Sedation Time: 55 minutes. The patient's level of consciousness and vital signs were monitored continuously by radiology nursing throughout the procedure  under my direct supervision. FLUOROSCOPY TIME:  5 minutes, 48 seconds (497 mGy) ACCESS: Right common femoral artery; hemostasis achieved with manual compression. COMPLICATIONS: None immediate. TECHNIQUE: Informed written consent was obtained from the patient after a discussion of the risks, benefits and alternatives to treatment. Questions regarding the procedure were encouraged and answered. A timeout was performed prior to the initiation of the procedure. The right groin was prepped and draped in the usual sterile fashion, and a sterile drape was applied covering the operative field. Maximum barrier sterile technique with sterile gowns and gloves were used for the procedure. A timeout was performed prior to the initiation of the procedure. Local anesthesia was provided with 1% lidocaine. The right femoral head was marked fluoroscopically. Under direct ultrasound guidance, the right common femoral artery was accessed  with a micropuncture kit allowing placement of a of 5-French vascular sheath. An ultrasound image was saved for documentation purposes. A limited arteriogram performed through the side arm of the sheath confirming appropriate access within the right common femoral artery. Over a Britta Mccreedy wire, a Mickelson catheter was advanced to the level of the inferior thoracic aorta where it was reformed, back bled and flushed. The Mickelson catheter was utilized to select the celiac artery and a celiac arteriogram was performed. Next, a stiff glidewire was advanced into the proximal aspect of the splenic artery. Under intermittent fluoroscopic guidance, the Mickelson catheter was exchanged for a Kumpe catheter which was advanced into the proximal aspect of the celiac artery. Next, with the use of a stiff Glidewire, the Kumpe catheter was advanced into the common hepatic artery and a common hepatic arteriogram was performed. With the use of a fathom 14 microwire, a high-flow Renegade microcatheter was advanced into the right hepatic artery and a selective right hepatic arteriogram was performed. Appropriate positioning for radioembolization was selected and then performed with Yttrium-90 SIR Spheres. Particles were administered via a microcatheter utilizing a completely enclosed system. Monitoring of antegrade flow was performed during and after the administration under fluoroscopy with use of contrast intermittently. After the administration of the particles, the microcatheter, outer catheter and administration system were then discarded into radiation safety receptacle. At this point, the procedure was terminated. The right common femoral approach vascular sheath was removed and hemostasis was achieved with manual compression. A dressing was placed. The patient tolerated procedure well without immediate postprocedural complication. All participants involved with the procedure were surveyed by the radiation safety officer prior  to exiting the fluoroscopy suite. The patient was escorted to the nuclear medicine department for post-treatment imaging. 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 Selective right hepatic arteriogram demonstrated development of several ill-defined lesions within both the anterior and posterior segments of the right lobe of the liver worrisome for metastatic disease (best seen on series 6) and likely progressed compared to abdominal MRI performed 06/15/2018. Technically successful administration of Y 90 radioembolization from the right lobe of the liver proximal to the vessel's bifurcation. Intermittent contrast injections during and after the radioembolization confirming preserved antegrade flow without reflux. IMPRESSION: Technically successful radioembolization of the right lobe of the liver with Yttrium-90 microspheres. PLAN: - The patient be seen in follow-up consultation (either in person or via tele medicine) and 1 month following the acquisition of a postprocedural CMP. - Initial postprocedural surveillance imaging will  be performed in 3 months (late November/early December) likely initially with PET-CT given concern for disease progression on today's angiogram. Electronically Signed   By: Sandi Mariscal M.D.   On: 08/27/2018 12:54   Ir Angiogram Selective Each Additional Vessel  Result Date: 08/27/2018 INDICATION: History metastatic colon cancer. Patient presents now for Y 90 radioembolization of the right lobe of the liver after undergoing Y 90 radioembolization the medial segmental branch of the left lobe of the liver 07/30/2018. EXAM: 1. ULTRASOUND GUIDANCE FOR ARTERIAL ACCESS. 2. CELIAC ARTERIOGRAM 3. SELECTIVE COMMON HEPATIC ARTERIOGRAM 4.  SELECTIVE RIGHT HEPATIC ARTERIOGRAM AND FLUOROSCOPIC GUIDED ADMINISTRATION OF SIR-SPHERES COMPARISON:  Mapping Y 90 radioembolization and postprocedural nuclear medicine imaging-07/17/2018; Y90 radioembolization the left lobe of the liver and postprocedural nuclear medicine-07/30/2018; PET-CT-02/15/2018; CT abdomen pelvis-06/18/2018 MEDICATIONS: Protonix 40 mg IV; Decadron 20 mg IV; Toradol 30 mg IV; Zofran 4 mg IV CONTRAST:  76m OMNIPAQUE IOHEXOL 300 MG/ML  SOLN ANESTHESIA/SEDATION: Moderate (conscious) sedation was employed during this procedure. A total of Versed 2 mg and Fentanyl 100 mcg was administered intravenously. Moderate Sedation Time: 55 minutes. The patient's level of consciousness and vital signs were monitored continuously by radiology nursing throughout the procedure under my direct supervision. FLUOROSCOPY TIME:  5 minutes, 48 seconds (497 mGy) ACCESS: Right common femoral artery; hemostasis achieved with manual compression. COMPLICATIONS: None immediate. TECHNIQUE: Informed written consent was obtained from the patient after a discussion of the risks, benefits and alternatives to treatment. Questions regarding the procedure were encouraged and answered. A timeout was performed prior to the initiation of the procedure. The right groin was prepped and draped in the usual sterile fashion, and a sterile drape was applied covering the operative field. Maximum barrier sterile technique with sterile gowns and gloves were used for the procedure. A timeout was performed prior to the initiation of the procedure. Local anesthesia was provided with 1% lidocaine. The right femoral head was marked fluoroscopically. Under direct ultrasound guidance, the right common femoral artery was accessed with a micropuncture kit allowing placement of a of 5-French vascular sheath. An ultrasound image was saved for documentation purposes. A limited arteriogram performed through the side arm of the sheath confirming  appropriate access within the right common femoral artery. Over a BBritta Mccreedywire, a Mickelson catheter was advanced to the level of the inferior thoracic aorta where it was reformed, back bled and flushed. The Mickelson catheter was utilized to select the celiac artery and a celiac arteriogram was performed. Next, a stiff glidewire was advanced into the proximal aspect of the splenic artery. Under intermittent fluoroscopic guidance, the Mickelson catheter was exchanged for a Kumpe catheter which was advanced into the proximal aspect of the celiac artery. Next, with the use of a stiff Glidewire, the Kumpe catheter was advanced into the common hepatic artery and a common hepatic arteriogram was performed. With the use of a fathom 14 microwire, a high-flow Renegade microcatheter was advanced into the right hepatic artery and a selective right hepatic arteriogram was performed. Appropriate positioning for radioembolization was selected and then performed with Yttrium-90 SIR Spheres. Particles were administered via a microcatheter utilizing a completely enclosed system. Monitoring of antegrade flow was performed during and after the administration under fluoroscopy with use of contrast intermittently. After the administration of the particles, the microcatheter, outer catheter and administration system were then discarded into radiation safety receptacle. At this point, the procedure was terminated. The right common femoral approach vascular sheath was removed and hemostasis was achieved with manual compression.  A dressing was placed. The patient tolerated procedure well without immediate postprocedural complication. All participants involved with the procedure were surveyed by the radiation safety officer prior to exiting the fluoroscopy suite. The patient was escorted to the nuclear medicine department for post-treatment imaging. 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 Selective right hepatic arteriogram demonstrated development of several ill-defined lesions within both the anterior and posterior segments of the right lobe of the liver worrisome for metastatic disease (best seen on series 6) and likely progressed compared to abdominal MRI performed 06/15/2018. Technically successful administration of Y 90 radioembolization from the right lobe of the liver proximal to the vessel's bifurcation. Intermittent contrast injections during and after the radioembolization confirming preserved antegrade flow without reflux. IMPRESSION: Technically successful radioembolization of the right lobe of the liver with Yttrium-90 microspheres. PLAN: - The patient be seen in follow-up consultation (either in person or via tele medicine) and 1 month following the acquisition of a postprocedural CMP. - Initial postprocedural surveillance imaging will be performed in 3 months (late November/early December) likely initially with PET-CT given concern for disease progression on today's angiogram. Electronically Signed   By: Sandi Mariscal M.D.   On: 08/27/2018 12:54   Nm Special Med Rad Physics Cons  Result Date: 08/27/2018 CLINICAL DATA:  Unresectable hepatic metastasis from colorectal carcinoma primary. Prior ablation to the RIGHT hepatic lobe. Prior yttrium 90 radio embolization in the subsegmental LEFT hepatic lobe with 9.7 millicuries. EXAM: NUCLEAR MEDICINE SPECIAL MED RAD PHYSICS CONS; NUCLEAR MEDICINE RADIO PHARM THERAPY INTRA ARTERIAL; NUCLEAR MEDICINE TREATMENT PROCEDURE; NUCLEAR MEDICINE LIVER SCAN TECHNIQUE: In conjunction with the interventional radiologist a Y- Microsphere dose was calculated utilizing body surface area formulation. Calculated dose equal 30.1 mCi. Pre therapy  MAA liver SPECT scan and CTA were evaluated. Utilizing a microcatheter system, the hepatic artery was selected and Y-90 microspheres were delivered in fractionated aliquots. Radiopharmaceutical was delivered by the interventional radiologist and nuclear radiologist. The patient tolerated procedure well. No adverse effects were noted. Bremsstrahlung planar and SPECT imaging of the abdomen following intrahepatic arterial delivery of Y-90 microsphere was performed. RADIOPHARMACEUTICALS:  30.6 microcuries yttrium 90 microspheres COMPARISON:  MAA scan 07/17/2018, MR 06/15/2018 FINDINGS: Y - 90 microspheres therapy as above. First therapy to the right hepatic lobe (45.4 millicuries). Prior therapy to the LEFT hepatic lobe (9.7 millicuries) on 09/81/1914. Bremsstrahlung planar and SPECT imaging of the abdomen following intrahepatic arterial delivery of Y-4mcrosphere demonstrates radioactivity localized to the RIGHT hepatic lobe. No evidence of extrahepatic activity. IMPRESSION: Successful Y - 90 microsphere delivery for treatment of unresectable liver metastasis. First therapy to the RIGHT lobe. Bremssstrahlung scan demonstrates activity localized to RIGHT hepatic lobe with no extrahepatic activity identified. Electronically Signed   By: SSuzy BouchardM.D.   On: 08/27/2018 12:48   Nm Special Treatment Procedure  Result Date: 08/27/2018 CLINICAL DATA:  Unresectable hepatic metastasis from colorectal carcinoma primary. Prior ablation to the RIGHT hepatic lobe. Prior yttrium 90 radio embolization in the subsegmental LEFT hepatic lobe with 9.7 millicuries. EXAM: NUCLEAR MEDICINE SPECIAL MED RAD PHYSICS CONS; NUCLEAR MEDICINE RADIO PHARM THERAPY INTRA ARTERIAL; NUCLEAR MEDICINE TREATMENT PROCEDURE; NUCLEAR MEDICINE LIVER SCAN TECHNIQUE: In conjunction with the interventional radiologist a Y- Microsphere dose was calculated utilizing body surface area formulation. Calculated dose equal 30.1 mCi. Pre therapy MAA  liver  SPECT scan and CTA were evaluated. Utilizing a microcatheter system, the hepatic artery was selected and Y-90 microspheres were delivered in fractionated aliquots. Radiopharmaceutical was delivered by the interventional radiologist and nuclear radiologist. The patient tolerated procedure well. No adverse effects were noted. Bremsstrahlung planar and SPECT imaging of the abdomen following intrahepatic arterial delivery of Y-90 microsphere was performed. RADIOPHARMACEUTICALS:  30.6 microcuries yttrium 90 microspheres COMPARISON:  MAA scan 07/17/2018, MR 06/15/2018 FINDINGS: Y - 90 microspheres therapy as above. First therapy to the right hepatic lobe (91.7 millicuries). Prior therapy to the LEFT hepatic lobe (9.7 millicuries) on 91/50/5697. Bremsstrahlung planar and SPECT imaging of the abdomen following intrahepatic arterial delivery of Y-74mcrosphere demonstrates radioactivity localized to the RIGHT hepatic lobe. No evidence of extrahepatic activity. IMPRESSION: Successful Y - 90 microsphere delivery for treatment of unresectable liver metastasis. First therapy to the RIGHT lobe. Bremssstrahlung scan demonstrates activity localized to RIGHT hepatic lobe with no extrahepatic activity identified. Electronically Signed   By: SSuzy BouchardM.D.   On: 08/27/2018 12:48   Ir [place] Vasc Access Right  Result Date: 08/27/2018 INDICATION: History metastatic colon cancer. Patient presents now for Y 90 radioembolization of the right lobe of the liver after undergoing Y 90 radioembolization the medial segmental branch of the left lobe of the liver 07/30/2018. EXAM: 1. ULTRASOUND GUIDANCE FOR ARTERIAL ACCESS. 2. CELIAC ARTERIOGRAM 3. SELECTIVE COMMON HEPATIC ARTERIOGRAM 4. SELECTIVE RIGHT HEPATIC ARTERIOGRAM AND FLUOROSCOPIC GUIDED ADMINISTRATION OF SIR-SPHERES COMPARISON:  Mapping Y 90 radioembolization and postprocedural nuclear medicine imaging-07/17/2018; Y90 radioembolization the left lobe of the liver and  postprocedural nuclear medicine-07/30/2018; PET-CT-02/15/2018; CT abdomen pelvis-06/18/2018 MEDICATIONS: Protonix 40 mg IV; Decadron 20 mg IV; Toradol 30 mg IV; Zofran 4 mg IV CONTRAST:  534mOMNIPAQUE IOHEXOL 300 MG/ML  SOLN ANESTHESIA/SEDATION: Moderate (conscious) sedation was employed during this procedure. A total of Versed 2 mg and Fentanyl 100 mcg was administered intravenously. Moderate Sedation Time: 55 minutes. The patient's level of consciousness and vital signs were monitored continuously by radiology nursing throughout the procedure under my direct supervision. FLUOROSCOPY TIME:  5 minutes, 48 seconds (497 mGy) ACCESS: Right common femoral artery; hemostasis achieved with manual compression. COMPLICATIONS: None immediate. TECHNIQUE: Informed written consent was obtained from the patient after a discussion of the risks, benefits and alternatives to treatment. Questions regarding the procedure were encouraged and answered. A timeout was performed prior to the initiation of the procedure. The right groin was prepped and draped in the usual sterile fashion, and a sterile drape was applied covering the operative field. Maximum barrier sterile technique with sterile gowns and gloves were used for the procedure. A timeout was performed prior to the initiation of the procedure. Local anesthesia was provided with 1% lidocaine. The right femoral head was marked fluoroscopically. Under direct ultrasound guidance, the right common femoral artery was accessed with a micropuncture kit allowing placement of a of 5-French vascular sheath. An ultrasound image was saved for documentation purposes. A limited arteriogram performed through the side arm of the sheath confirming appropriate access within the right common femoral artery. Over a BeBritta Mccreedyire, a Mickelson catheter was advanced to the level of the inferior thoracic aorta where it was reformed, back bled and flushed. The Mickelson catheter was utilized to select  the celiac artery and a celiac arteriogram was performed. Next, a stiff glidewire was advanced into the proximal aspect of the splenic artery. Under intermittent fluoroscopic guidance, the Mickelson catheter was exchanged for a Kumpe catheter which was advanced into  the proximal aspect of the celiac artery. Next, with the use of a stiff Glidewire, the Kumpe catheter was advanced into the common hepatic artery and a common hepatic arteriogram was performed. With the use of a fathom 14 microwire, a high-flow Renegade microcatheter was advanced into the right hepatic artery and a selective right hepatic arteriogram was performed. Appropriate positioning for radioembolization was selected and then performed with Yttrium-90 SIR Spheres. Particles were administered via a microcatheter utilizing a completely enclosed system. Monitoring of antegrade flow was performed during and after the administration under fluoroscopy with use of contrast intermittently. After the administration of the particles, the microcatheter, outer catheter and administration system were then discarded into radiation safety receptacle. At this point, the procedure was terminated. The right common femoral approach vascular sheath was removed and hemostasis was achieved with manual compression. A dressing was placed. The patient tolerated procedure well without immediate postprocedural complication. All participants involved with the procedure were surveyed by the radiation safety officer prior to exiting the fluoroscopy suite. The patient was escorted to the nuclear medicine department for post-treatment imaging. 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 Selective right hepatic arteriogram  demonstrated development of several ill-defined lesions within both the anterior and posterior segments of the right lobe of the liver worrisome for metastatic disease (best seen on series 6) and likely progressed compared to abdominal MRI performed 06/15/2018. Technically successful administration of Y 90 radioembolization from the right lobe of the liver proximal to the vessel's bifurcation. Intermittent contrast injections during and after the radioembolization confirming preserved antegrade flow without reflux. IMPRESSION: Technically successful radioembolization of the right lobe of the liver with Yttrium-90 microspheres. PLAN: - The patient be seen in follow-up consultation (either in person or via tele medicine) and 1 month following the acquisition of a postprocedural CMP. - Initial postprocedural surveillance imaging will be performed in 3 months (late November/early December) likely initially with PET-CT given concern for disease progression on today's angiogram. Electronically Signed   By: Sandi Mariscal M.D.   On: 08/27/2018 12:54   Ir Embo Tumor Organ Ischemia Infarct Inc Guide Roadmapping  Result Date: 08/27/2018 INDICATION: History metastatic colon cancer. Patient presents now for Y 90 radioembolization of the right lobe of the liver after undergoing Y 90 radioembolization the medial segmental branch of the left lobe of the liver 07/30/2018. EXAM: 1. ULTRASOUND GUIDANCE FOR ARTERIAL ACCESS. 2. CELIAC ARTERIOGRAM 3. SELECTIVE COMMON HEPATIC ARTERIOGRAM 4. SELECTIVE RIGHT HEPATIC ARTERIOGRAM AND FLUOROSCOPIC GUIDED ADMINISTRATION OF SIR-SPHERES COMPARISON:  Mapping Y 90 radioembolization and postprocedural nuclear medicine imaging-07/17/2018; Y90 radioembolization the left lobe of the liver and postprocedural nuclear medicine-07/30/2018; PET-CT-02/15/2018; CT abdomen pelvis-06/18/2018 MEDICATIONS: Protonix 40 mg IV; Decadron 20 mg IV; Toradol 30 mg IV; Zofran 4 mg IV CONTRAST:  51m OMNIPAQUE IOHEXOL  300 MG/ML  SOLN ANESTHESIA/SEDATION: Moderate (conscious) sedation was employed during this procedure. A total of Versed 2 mg and Fentanyl 100 mcg was administered intravenously. Moderate Sedation Time: 55 minutes. The patient's level of consciousness and vital signs were monitored continuously by radiology nursing throughout the procedure under my direct supervision. FLUOROSCOPY TIME:  5 minutes, 48 seconds (497 mGy) ACCESS: Right common femoral artery; hemostasis achieved with manual compression. COMPLICATIONS: None immediate. TECHNIQUE: Informed written consent was obtained from the patient after a discussion of the risks,  benefits and alternatives to treatment. Questions regarding the procedure were encouraged and answered. A timeout was performed prior to the initiation of the procedure. The right groin was prepped and draped in the usual sterile fashion, and a sterile drape was applied covering the operative field. Maximum barrier sterile technique with sterile gowns and gloves were used for the procedure. A timeout was performed prior to the initiation of the procedure. Local anesthesia was provided with 1% lidocaine. The right femoral head was marked fluoroscopically. Under direct ultrasound guidance, the right common femoral artery was accessed with a micropuncture kit allowing placement of a of 5-French vascular sheath. An ultrasound image was saved for documentation purposes. A limited arteriogram performed through the side arm of the sheath confirming appropriate access within the right common femoral artery. Over a Britta Mccreedy wire, a Mickelson catheter was advanced to the level of the inferior thoracic aorta where it was reformed, back bled and flushed. The Mickelson catheter was utilized to select the celiac artery and a celiac arteriogram was performed. Next, a stiff glidewire was advanced into the proximal aspect of the splenic artery. Under intermittent fluoroscopic guidance, the Mickelson catheter was  exchanged for a Kumpe catheter which was advanced into the proximal aspect of the celiac artery. Next, with the use of a stiff Glidewire, the Kumpe catheter was advanced into the common hepatic artery and a common hepatic arteriogram was performed. With the use of a fathom 14 microwire, a high-flow Renegade microcatheter was advanced into the right hepatic artery and a selective right hepatic arteriogram was performed. Appropriate positioning for radioembolization was selected and then performed with Yttrium-90 SIR Spheres. Particles were administered via a microcatheter utilizing a completely enclosed system. Monitoring of antegrade flow was performed during and after the administration under fluoroscopy with use of contrast intermittently. After the administration of the particles, the microcatheter, outer catheter and administration system were then discarded into radiation safety receptacle. At this point, the procedure was terminated. The right common femoral approach vascular sheath was removed and hemostasis was achieved with manual compression. A dressing was placed. The patient tolerated procedure well without immediate postprocedural complication. All participants involved with the procedure were surveyed by the radiation safety officer prior to exiting the fluoroscopy suite. The patient was escorted to the nuclear medicine department for post-treatment imaging. 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 Selective right hepatic arteriogram demonstrated development of several ill-defined lesions within both the anterior and posterior segments of the right lobe of the liver worrisome for metastatic disease (best seen on series 6) and likely progressed  compared to abdominal MRI performed 06/15/2018. Technically successful administration of Y 90 radioembolization from the right lobe of the liver proximal to the vessel's bifurcation. Intermittent contrast injections during and after the radioembolization confirming preserved antegrade flow without reflux. IMPRESSION: Technically successful radioembolization of the right lobe of the liver with Yttrium-90 microspheres. PLAN: - The patient be seen in follow-up consultation (either in person or via tele medicine) and 1 month following the acquisition of a postprocedural CMP. - Initial postprocedural surveillance imaging will be performed in 3 months (late November/early December) likely initially with PET-CT given concern for disease progression on today's angiogram. Electronically Signed   By: Sandi Mariscal M.D.   On: 08/27/2018 12:54   Nm Radio Pharm Therapy Intraarterial  Result Date: 08/27/2018 CLINICAL DATA:  Unresectable hepatic metastasis from colorectal carcinoma primary. Prior ablation to the RIGHT hepatic lobe. Prior yttrium 90 radio embolization in the subsegmental LEFT hepatic lobe with 9.7 millicuries. EXAM: NUCLEAR MEDICINE SPECIAL MED RAD PHYSICS CONS; NUCLEAR MEDICINE RADIO PHARM THERAPY INTRA ARTERIAL; NUCLEAR MEDICINE TREATMENT PROCEDURE; NUCLEAR MEDICINE LIVER SCAN TECHNIQUE: In conjunction with the interventional radiologist a Y- Microsphere dose was calculated utilizing body surface area formulation. Calculated dose equal 30.1 mCi. Pre therapy MAA liver SPECT scan and CTA were evaluated. Utilizing a microcatheter system, the hepatic artery was selected and Y-90 microspheres were delivered in fractionated aliquots. Radiopharmaceutical was delivered by the interventional radiologist and nuclear radiologist. The patient tolerated procedure well. No adverse effects were noted. Bremsstrahlung planar and SPECT imaging of the abdomen following intrahepatic arterial delivery of Y-90 microsphere was  performed. RADIOPHARMACEUTICALS:  30.6 microcuries yttrium 90 microspheres COMPARISON:  MAA scan 07/17/2018, MR 06/15/2018 FINDINGS: Y - 90 microspheres therapy as above. First therapy to the right hepatic lobe (16.0 millicuries). Prior therapy to the LEFT hepatic lobe (9.7 millicuries) on 10/93/2355. Bremsstrahlung planar and SPECT imaging of the abdomen following intrahepatic arterial delivery of Y-45mcrosphere demonstrates radioactivity localized to the RIGHT hepatic lobe. No evidence of extrahepatic activity. IMPRESSION: Successful Y - 90 microsphere delivery for treatment of unresectable liver metastasis. First therapy to the RIGHT lobe. Bremssstrahlung scan demonstrates activity localized to RIGHT hepatic lobe with no extrahepatic activity identified. Electronically Signed   By: SSuzy BouchardM.D.   On: 08/27/2018 12:48    Labs:  CBC: Recent Labs    07/17/18 0847 07/30/18 0826 08/27/18 0833 09/14/18 0916  WBC 6.6 6.3 6.0 6.4  HGB 13.2 12.8* 13.4 13.7  HCT 41.9 40.9 41.8 42.3  PLT 124* 128* 113* 133*    COAGS: Recent Labs    11/22/17 0746 07/17/18 0847 07/30/18 0826 08/27/18 0833  INR 1.07 1.2 1.1 1.1  APTT  --  41*  --   --     BMP: Recent Labs    07/17/18 0847 07/30/18 0826 08/27/18 0833 09/14/18 0916  NA 140 143 142 141  K 3.7 3.7 3.6 4.0  CL 104 107 108 105  CO2 _0 GLUCOSE 106* 108* 115* 97  BUN 28* 22 27* 18  CALCIUM 9.0 8.6* 8.8* 9.2  CREATININE 1.36* 1.36* 1.16 1.17  GFRNONAA 52* 52* >60 >60  GFRAA >60 >60 >60 >60    LIVER FUNCTION TESTS: Recent Labs    07/17/18 0847 07/30/18 0826 08/27/18 0833 09/14/18 0916  BILITOT 1.1 0.8 1.1 0.9  AST _1 52*  ALT _2 55*  ALKPHOS 89 88 95 186*  PROT 6.9 6.6 6.6 6.3*  ALBUMIN 4.1 3.8 3.8 3.7    TUMOR MARKERS:     Assessment and Plan:  JJUDY GOODENOWis a 73y.o. male with past medical history significant for atrial fibrillation(on Coumadin), pituitary tumor (post resection)  with chronic steroid supplementation, hypertension and hypothyroidism post liver directed therapy for metastatic colon cancer including image guided microwave ablation of two liver lesions on 11/22/2017, mapping Y 90 embolization performed on 07/17/2018, Y 90 embolization of the medial segmental division of the left lobe of the liver performed on 07/30/2018 and Y 90 embolization of the right lobe of the liver performed on 08/27/2018.  Per the patient's wife report, the patient has recovered completely from the completion Y 90 embolization prior to this recent episode of lower extremity cellulitis resulting in the  patient's admission.  I explained that at the time of the right sided radioembolization I identified several new ill-defined areas of hyperenhancement within the liver which were concerning for additional areas of metastatic disease.  This finding is supported by continued elevation of the patient's CEA level as detailed above.  LFTs obtained on 09/14/2018 demonstrated mild elevation of patient's AST, ALT and alkaline phosphatase with persistent normalization of the patient's bilirubin, an expected finding given recent Y 90 radioembolization.  Plan: - Will obtain initial postprocedural imaging 3 months following the completion Y 90 radioembolization (late November 2020).  Initial surveillance imaging will be obtained with a PET/CT given potential for extrahepatic metastatic disease.  Ultimately, dedicated imaging of the liver with abdominal MRI may be pursued. - We will obtain 2-monthpostprocedural CMP and CEA levels.  The patient's wife demonstrated excellent understanding of the above conversation and is agreement with the plan of care.  She knows to call the interventional radiology clinic with any interval questions or concerns.  I wished her husband a speedy recovery and hopefully a prompt discharge from the hospital for this episode of cellulitis.  A copy of this report was sent to the  requesting provider on this date.  Electronically Signed: JSandi Mariscal9/16/2020, 9:47 AM   I spent a total of 15 Minutes in remote  clinical consultation, greater than 50% of which was counseling/coordinating care for post Y 90 radioembolization.    Visit type: Audio only (telephone). Audio (no video) only due to patient's lack of internet/smartphone capability. [facility] [place] This visit type was conducted due to national recommendations for restrictions regarding the COVID-19 Pandemic (e.g. social distancing).  This format is felt to be most appropriate for this patient at this time.  All issues noted in this document were discussed and addressed.

## 2018-09-21 ENCOUNTER — Telehealth: Payer: Self-pay | Admitting: *Deleted

## 2018-09-21 MED ORDER — Medication
1.75 | Status: DC
Start: 2018-09-22 — End: 2018-09-21

## 2018-09-21 MED ORDER — TESTOSTERONE 4 MG/24HR TD PT24
1.00 | MEDICATED_PATCH | TRANSDERMAL | Status: DC
Start: 2018-09-21 — End: 2018-09-21

## 2018-09-21 MED ORDER — GENERIC EXTERNAL MEDICATION
1.00 | Status: DC
Start: 2018-09-21 — End: 2018-09-21

## 2018-09-21 MED ORDER — Medication
7.50 | Status: DC
Start: 2018-09-21 — End: 2018-09-21

## 2018-09-21 NOTE — Telephone Encounter (Signed)
Call received from patient's wife to notify Dr. Marin Olp that pt was admitted to [facility] on Tuesday, 09/18/18 with "infection".  Dr. Marin Olp notified.

## 2018-10-26 ENCOUNTER — Telehealth: Payer: Self-pay

## 2018-10-26 NOTE — Telephone Encounter (Signed)
Called patient to see if he could reschedule his port flush appt from 10/30 to the following week, anytime that works for him.  Was not able to leave a message

## 2018-11-28 ENCOUNTER — Other Ambulatory Visit: Payer: Self-pay | Admitting: Interventional Radiology

## 2018-11-28 DIAGNOSIS — C787 Secondary malignant neoplasm of liver and intrahepatic bile duct: Secondary | ICD-10-CM

## 2018-11-28 DIAGNOSIS — C189 Malignant neoplasm of colon, unspecified: Secondary | ICD-10-CM

## 2018-12-07 ENCOUNTER — Other Ambulatory Visit (HOSPITAL_COMMUNITY): Payer: Medicare HMO

## 2018-12-11 ENCOUNTER — Other Ambulatory Visit: Payer: Self-pay

## 2018-12-11 ENCOUNTER — Encounter (HOSPITAL_COMMUNITY)
Admission: RE | Admit: 2018-12-11 | Discharge: 2018-12-11 | Disposition: A | Discharge status: Home / Self Care | Payer: Medicare HMO | Source: Ambulatory Visit | Attending: Interventional Radiology | Admitting: Interventional Radiology

## 2018-12-11 DIAGNOSIS — C787 Secondary malignant neoplasm of liver and intrahepatic bile duct: Secondary | ICD-10-CM | POA: Insufficient documentation

## 2018-12-11 DIAGNOSIS — I700 Atherosclerosis of aorta: Secondary | ICD-10-CM | POA: Insufficient documentation

## 2018-12-11 DIAGNOSIS — C189 Malignant neoplasm of colon, unspecified: Secondary | ICD-10-CM

## 2018-12-11 LAB — GLUCOSE, CAPILLARY: Glucose-Capillary: 83 mg/dL (ref 70–99)

## 2018-12-11 MED ORDER — FLUDEOXYGLUCOSE F - 18 (FDG) INJECTION
11.4000 | Freq: Once | INTRAVENOUS | Status: AC | PRN
  Administered 2018-12-11: 11.4 via INTRAVENOUS

## 2018-12-14 ENCOUNTER — Encounter: Payer: Self-pay | Admitting: *Deleted

## 2018-12-14 ENCOUNTER — Inpatient Hospital Stay: Payer: Medicare HMO

## 2018-12-14 ENCOUNTER — Other Ambulatory Visit: Payer: Self-pay

## 2018-12-14 ENCOUNTER — Inpatient Hospital Stay: Payer: Medicare HMO | Attending: Hematology & Oncology | Admitting: Hematology & Oncology

## 2018-12-14 VITALS — BP 144/81 | HR 56 | Temp 97.8°F | Resp 18 | Wt 244.8 lb

## 2018-12-14 VITALS — BP 144/81 | HR 56 | Temp 97.8°F | Resp 18 | Ht 70.0 in | Wt 244.1 lb

## 2018-12-14 DIAGNOSIS — C189 Malignant neoplasm of colon, unspecified: Secondary | ICD-10-CM

## 2018-12-14 DIAGNOSIS — C787 Secondary malignant neoplasm of liver and intrahepatic bile duct: Secondary | ICD-10-CM | POA: Insufficient documentation

## 2018-12-14 DIAGNOSIS — Z7901 Long term (current) use of anticoagulants: Secondary | ICD-10-CM | POA: Insufficient documentation

## 2018-12-14 DIAGNOSIS — Z79899 Other long term (current) drug therapy: Secondary | ICD-10-CM | POA: Diagnosis not present

## 2018-12-14 LAB — CBC WITH DIFFERENTIAL (CANCER CENTER ONLY)
Abs Immature Granulocytes: 0.03 10*3/uL (ref 0.00–0.07)
Basophils Absolute: 0.1 10*3/uL (ref 0.0–0.1)
Basophils Relative: 1 %
Eosinophils Absolute: 0.2 10*3/uL (ref 0.0–0.5)
Eosinophils Relative: 4 %
HCT: 40.2 % (ref 39.0–52.0)
Hemoglobin: 13.3 g/dL (ref 13.0–17.0)
Immature Granulocytes: 1 %
Lymphocytes Relative: 21 %
Lymphs Abs: 1 10*3/uL (ref 0.7–4.0)
MCH: 29.7 pg (ref 26.0–34.0)
MCHC: 33.1 g/dL (ref 30.0–36.0)
MCV: 89.7 fL (ref 80.0–100.0)
Monocytes Absolute: 0.4 10*3/uL (ref 0.1–1.0)
Monocytes Relative: 9 %
Neutro Abs: 3.2 10*3/uL (ref 1.7–7.7)
Neutrophils Relative %: 64 %
Platelet Count: 96 10*3/uL — ABNORMAL LOW (ref 150–400)
RBC: 4.48 MIL/uL (ref 4.22–5.81)
RDW: 15.6 % — ABNORMAL HIGH (ref 11.5–15.5)
WBC Count: 4.8 10*3/uL (ref 4.0–10.5)
nRBC: 0 % (ref 0.0–0.2)

## 2018-12-14 LAB — CMP (CANCER CENTER ONLY)
ALT: 49 U/L — ABNORMAL HIGH (ref 0–44)
AST: 71 U/L — ABNORMAL HIGH (ref 15–41)
Albumin: 3.8 g/dL (ref 3.5–5.0)
Alkaline Phosphatase: 252 U/L — ABNORMAL HIGH (ref 38–126)
Anion gap: 6 (ref 5–15)
BUN: 20 mg/dL (ref 8–23)
CO2: 29 mmol/L (ref 22–32)
Calcium: 8.9 mg/dL (ref 8.9–10.3)
Chloride: 107 mmol/L (ref 98–111)
Creatinine: 1.17 mg/dL (ref 0.61–1.24)
GFR, Est AFR Am: >60 mL/min (ref >60)
GFR, Estimated: >60 mL/min (ref >60)
Glucose, Bld: 97 mg/dL (ref 70–99)
Potassium: 4 mmol/L (ref 3.5–5.1)
Sodium: 142 mmol/L (ref 135–145)
Total Bilirubin: 0.7 mg/dL (ref 0.3–1.2)
Total Protein: 6.4 g/dL — ABNORMAL LOW (ref 6.5–8.1)

## 2018-12-14 LAB — CEA (IN HOUSE-CHCC): CEA (CHCC-In House): 229.43 ng/mL — ABNORMAL HIGH (ref 0.00–5.00)

## 2018-12-14 MED ORDER — SODIUM CHLORIDE 0.9% FLUSH
10.0000 mL | INTRAVENOUS | Status: DC | PRN
  Administered 2018-12-14: 10 mL via INTRAVENOUS
  Filled 2018-12-14: qty 10

## 2018-12-14 MED ORDER — HEPARIN SOD (PORK) LOCK FLUSH 100 UNIT/ML IV SOLN
500.0000 [IU] | Freq: Once | INTRAVENOUS | Status: AC
  Administered 2018-12-14: 500 [IU] via INTRAVENOUS
  Filled 2018-12-14: qty 5

## 2018-12-14 NOTE — Progress Notes (Signed)
Hematology and Oncology Follow Up Visit  BRAYLEE BOSHER [id number] 1945/12/27 73 y.o. 12/14/2018   Principle Diagnosis:  Metastatic colon cancer - progressive  Current Therapy:   Status post RFA to the liver-11/21/2017  S/P Y-90 x 2 -- last therapy on 08/27/2018 FOLFIRI --  Cycle #1 to start on 01/09/2019    Interim History:  Mr. Whang is back for follow-up.  He looks great.  He says he feels good.  Unfortunately, I think we have a problem.  He had a PET scan that was done recently.  It was done on 12/11/2018.  The PET scan showed progression of his liver disease.  He has increase in a tumor in the central liver now measuring 3.1 cm.  The SUV was 23.  There is a new area in the right liver measuring 3 x 2.9 cm.  This has an SUV of 12.5.  There might be a little bit of activity in a right pleural region.  His CEA today is up to 225.  Clearly, we are going to have to start him on systemic chemotherapy.  I have been trying to hold off as long as possible.  I have been doing intrahepatic therapy with him.  Radiology has been doing a fantastic job with [place] to try to help control his hepatic metastasis.  He has had FOLFOX in the past.  It probably has been a couple years that he has had chemotherapy.  We had gotten a biopsy previously.  We had gotten molecular studies.  Unfortunately, there was not enough material for molecular markers.  As such, I do not know if this tumor is K-ras positive or BRAF positive.  I may have to consider doing some type of liquid biopsy.  He is on Coumadin.  His heart is doing well.  He has had no chest pain.  He has had no increase shortness of breath.  He has had no bleeding.  He has had no nausea or vomiting.  Overall, I would have to say that his performance status is ECOG 1.      Medications:  Current Outpatient Medications:  .  allopurinol (ZYLOPRIM) 300 MG tablet, Take 300 mg by mouth every morning. , Disp: , Rfl:  .  atorvastatin (LIPITOR) 40 MG tablet,  Take 40 mg by mouth daily., Disp: , Rfl:  .  carvedilol (COREG) 6.25 MG tablet, TAKE 1 TABLET (6.25 MG TOTAL) BY MOUTH 2 TIMES DAILY WITH MEALS., Disp: , Rfl:  .  [employer] ASPIRIN ADULT LOW DOSE 81 MG chewable tablet, Chew 81 mg by mouth daily., Disp: , Rfl:  .  [employer] STOOL SOFTENER/LAXATIVE 8.6-50 MG tablet, TAKE 2 TABLETS EVERY EVENING, Disp: , Rfl:  .  ferrous sulfate 325 (65 FE) MG tablet, Take 325 mg by mouth daily., Disp: , Rfl:  .  furosemide (LASIX) 20 MG tablet, Take 20 mg by mouth daily. , Disp: , Rfl:  .  hydrocortisone (CORTEF) 10 MG tablet, Take 5-15 mg by mouth See admin instructions. Takes 15 mg in am and 83m in afternoon., Disp: , Rfl: 4 .  levothyroxine (SYNTHROID, LEVOTHROID) 88 MCG tablet, Take 88 mcg by mouth daily before breakfast. , Disp: , Rfl:  .  loperamide (IMODIUM) 2 MG capsule, Take 2 mg by mouth as needed for diarrhea or loose stools. , Disp: , Rfl:  .  NASAL WASH NA, Place 1 spray into the nose daily. , Disp: , Rfl:  .  pantoprazole (PROTONIX) 40 MG tablet, Take 40  mg by mouth every morning. , Disp: , Rfl:  .  polyvinyl alcohol (LIQUIFILM TEARS) 1.4 % ophthalmic solution, Place 1 drop into both eyes daily as needed for dry eyes. , Disp: , Rfl:  .  potassium chloride (KLOR-CON 10) 10 MEQ tablet, Take 20 mEq by mouth daily., Disp: , Rfl:  .  testosterone (ANDRODERM) 4 MG/24HR PT24 patch, Place 1 patch onto the skin daily. , Disp: , Rfl:  .  warfarin (COUMADIN) 5 MG tablet, Take 5 mg by mouth daily., Disp: , Rfl: 0 .  prochlorperazine (COMPAZINE) 10 MG tablet, Take 10 mg by mouth every 8 (eight) hours as needed for nausea or vomiting. , Disp: , Rfl:   Allergies: No Known Allergies  Past Medical History, Surgical history, Social history, and Family History were reviewed and updated.  Review of Systems: Review of Systems  Constitutional: Negative.   HENT:  Negative.   Eyes: Negative.   Respiratory: Negative.   Cardiovascular: Negative.   Gastrointestinal: Negative.     Endocrine: Negative.   Genitourinary: Negative.    Musculoskeletal: Negative.   Skin: Negative.   Neurological: Negative.   Hematological: Negative.   Psychiatric/Behavioral: Negative.     Physical Exam:  height is '5\' 10"'  (1.778 m) and weight is 244 lb 1.9 oz (110.7 kg). His temporal temperature is 97.8 F (36.6 C). His blood pressure is 144/81 (abnormal) and his pulse is 56 (abnormal). His respiration is 18 and oxygen saturation is 100%.   Wt Readings from Last 3 Encounters:  12/14/18 244 lb 1.9 oz (110.7 kg)  12/14/18 244 lb 12 oz (111 kg)  09/14/18 234 lb (106.1 kg)    Physical Exam Vitals reviewed.  HENT:     Head: Normocephalic and atraumatic.  Eyes:     Pupils: Pupils are equal, round, and reactive to light.  Cardiovascular:     Rate and Rhythm: Normal rate and regular rhythm.     Heart sounds: Normal heart sounds.     Comments: Regular rate and rhythm with an occasional extra beat. Pulmonary:     Effort: Pulmonary effort is normal.     Breath sounds: Normal breath sounds.  Abdominal:     General: Bowel sounds are normal.     Palpations: Abdomen is soft.     Comments: Abdomen is soft.  He has multiple laparotomy scars.  There is no fluid wave.  There is no palpable liver or spleen tip.  Musculoskeletal:        General: No tenderness or deformity. Normal range of motion.     Cervical back: Normal range of motion.  Lymphadenopathy:     Cervical: No cervical adenopathy.  Skin:    General: Skin is warm and dry.     Findings: No erythema or rash.  Neurological:     Mental Status: He is alert and oriented to person, place, and time.  Psychiatric:        Behavior: Behavior normal.        Thought Content: Thought content normal.        Judgment: Judgment normal.      Lab Results  Component Value Date   WBC 4.8 12/14/2018   HGB 13.3 12/14/2018   HCT 40.2 12/14/2018   MCV 89.7 12/14/2018   PLT 96 (L) 12/14/2018     Chemistry      Component Value Date/Time    NA 142 12/14/2018 1020   K 4.0 12/14/2018 1020   CL 107 12/14/2018 1020  CO2 29 12/14/2018 1020   BUN 20 12/14/2018 1020   CREATININE 1.17 12/14/2018 1020      Component Value Date/Time   CALCIUM 8.9 12/14/2018 1020   ALKPHOS 252 (H) 12/14/2018 1020   AST 71 (H) 12/14/2018 1020   ALT 49 (H) 12/14/2018 1020   BILITOT 0.7 12/14/2018 1020       Impression and Plan: Mr. Marbach is a 73 year old white male with metastatic colon cancer.  Again, he has had pretty much the intrahepatic therapy that we can give him.  Radiology has done a great job with this.  We will have to start him on systemic chemotherapy.  The rising CEA I think indicates that he is at significant risk for extrahepatic metastasis.  We actually was able to do chemotherapy teaching on him today.  I will start FOLFIRI after New Year's.  I do not want to use Avastin right now given his cardiac situation.  I really think we should try to hold off on Avastin unless we really have to use it.  I realize that he is on blood thinner but still, I just think that Avastin might cause issues for him with his heart.  I spent about 45 minutes with him today.  This was complicated since we now have progressive disease.  I really needed to talk with him and give him an update and overview asked to my recommendations.  I answered all of his questions.  He is in agreement.  We will start him on treatment the first week in January.  We will do 4 cycles and then repeat his scan.       [name], MD 12/11/202011:17 AM

## 2018-12-14 NOTE — Patient Instructions (Signed)

## 2018-12-14 NOTE — Progress Notes (Unsigned)
Patient in chemotherapy education class with  Self.  Discussed side effects of 5FU, Irinotecan, Leucovorin which include but are not limited to myelosuppression, decreased appetite, fatigue, fever, allergic or infusional reaction, mucositis, cardiac toxicity, cough, SOB, altered taste, nausea and vomiting, diarrhea, constipation, elevated LFTs myalgia and arthralgias, hair loss or thinning, rash, skin dryness, nail changes, peripheral neuropathy, discolored urine, delayed wound healing, mental changes (Chemo brain), increased risk of infections, weight loss.  Reviewed infusion room and office policy and procedure and phone numbers 24 hours x 7 days a week.  Reviewed ambulatory pump specifics and how to manage safe handling at home.  Reviewed when to call the office with any concerns or problems.  Scientist, clinical (histocompatibility and immunogenetics) given.  Discussed portacath insertion and EMLA cream administration.  Antiemetic protocol and chemotherapy schedule reviewed. Patient verbalized understanding of chemotherapy indications and possible side effects.  Teachback done

## 2018-12-14 NOTE — Progress Notes (Signed)
START ON PATHWAY REGIMEN - Colorectal     A cycle is every 14 days:     Irinotecan      Leucovorin      Fluorouracil      Fluorouracil   **Always confirm dose/schedule in your pharmacy ordering system**  Patient Characteristics: Distant Metastases, Nonsurgical Candidate, KRAS/NRAS Mutation Positive/Unknown (BRAF V600 Wild-Type/Unknown), Standard Cytotoxic Therapy, Second Line Standard Cytotoxic Therapy, Bevacizumab Ineligible Tumor Location: Colon Therapeutic Status: Distant Metastases Microsatellite/Mismatch Repair Status: Unknown BRAF Mutation Status: Quantity Not Sufficient KRAS/NRAS Mutation Status: Quantity Not Sufficient Standard Cytotoxic Line of Therapy: Second Line Standard Cytotoxic Therapy Bevacizumab Eligibility: Ineligible Intent of Therapy: Non-Curative / Palliative Intent, Discussed with Patient

## 2018-12-18 ENCOUNTER — Encounter: Payer: Self-pay | Admitting: *Deleted

## 2018-12-18 ENCOUNTER — Other Ambulatory Visit: Payer: Self-pay | Admitting: *Deleted

## 2018-12-18 ENCOUNTER — Ambulatory Visit
Admission: RE | Admit: 2018-12-18 | Discharge: 2018-12-18 | Disposition: A | Discharge status: Home / Self Care | Payer: Medicare HMO | Source: Ambulatory Visit | Attending: Interventional Radiology | Admitting: Interventional Radiology

## 2018-12-18 DIAGNOSIS — C189 Malignant neoplasm of colon, unspecified: Secondary | ICD-10-CM

## 2018-12-18 DIAGNOSIS — C787 Secondary malignant neoplasm of liver and intrahepatic bile duct: Secondary | ICD-10-CM

## 2018-12-18 HISTORY — PX: IR RADIOLOGIST EVAL & MGMT: IMG5224

## 2018-12-18 MED ORDER — LORAZEPAM 1 MG PO TABS: 1.0000 mg | ORAL_TABLET | Freq: Four times a day (QID) | ORAL | 0 refills | Status: DC | PRN

## 2018-12-18 MED ORDER — PROCHLORPERAZINE MALEATE 10 MG PO TABS: 10.0000 mg | ORAL_TABLET | Freq: Four times a day (QID) | ORAL | 1 refills | Status: DC | PRN

## 2018-12-18 MED ORDER — ONDANSETRON HCL 8 MG PO TABS: 8.0000 mg | ORAL_TABLET | Freq: Two times a day (BID) | ORAL | 1 refills | Status: DC | PRN

## 2018-12-18 MED ORDER — DEXAMETHASONE 4 MG PO TABS: 8.0000 mg | ORAL_TABLET | Freq: Every day | ORAL | 5 refills | Status: DC

## 2018-12-18 MED ORDER — LIDOCAINE-PRILOCAINE 2.5-2.5 % EX CREA: TOPICAL_CREAM | CUTANEOUS | 3 refills | Status: DC

## 2018-12-18 MED ORDER — LOPERAMIDE HCL 2 MG PO TABS: 2.0000 mg | ORAL_TABLET | Freq: Four times a day (QID) | ORAL | 1 refills | Status: DC | PRN

## 2018-12-18 NOTE — Progress Notes (Signed)
Patient ID: Zachary Willis, male   DOB: [birth year], 73 y.o.   MRN: [id number]         Chief Complaint: Metastatic colon cancer  Referring Physician(s): Ennever  History of Present Illness: Zachary Willis is a 73 y.o. male with past medical history significant for atrial fibrillation(on Coumadin), pituitary tumor (post resection) with chronic steroid supplementation, hypertension and hypothyroidism who initially presented to the interventional radiology clinic on 10/10/2017 for evaluation of liver directed therapy for metastatic colon cancer.  In brief review: - The patient initially underwent image guided microwave ablation of two liver lesions on 11/22/2017. - Surveillance PET/CT performed 02/15/2018 demonstrated residual/recurrent hypermetabolic activity within the lesion within the central aspect of the caudate lobe without definitive activity associated with the ablated lesion with the peripheral aspect of the right lobe of the liver. This finding is associated with significant reduction in the patient's CEA level which was 36 on 08/30/2017, reduced to 3.2 on February 17. Given the COVID-19 pandemic the decision was made to continue with continued surveillance and dedicated intervention was not performed at that time. Unfortunately,thepatient's CEA level has risen since Zachary Willis to be 12.4 on April 30 and 18 on 5/27. - Ultimately the decision was made to pursue Y 90 radioembolization with mapping Y 90 embolization performed on 07/17/2018, Y 90 embolization of the medial segmental division of the left lobe of the liver performed on 07/30/2018 and Y 90 embolization of the right lobe of the liver performed on 08/27/2018.  The patient is seen today in telemedicine consultation following acquisition of postprocedural PET/CT performed 12/11/2018.  The patient is accompanied on the phone call today by his wife.  Patient is again without complaint.  Specifically, no change in appetite or energy  level.    Past Medical History:  Diagnosis Date  . Anticoagulated on Coumadin    chronic long term  . Antineoplastic chemotherapy induced anemia   . Chronic atrial fibrillation    cardiologist--  dr Curly [facility] in [place])--- hx DVVC in 2006/  TEE 10-21-2015 (care everywhere)  ef 55-60%, moderate dilated RA, mild MR, RVSP 28mmHg  . CKD (chronic kidney disease), stage II   . Colon cancer metastasized to liver [facility])    current oncologist-- dr Marin Olp (previous oncologist at [facility] oncology)--- dx 12/ 2017,  Stage IIb ([id number]),  s/p  resection colon tumor 01-01-2016 ,  recurrent w/ liver mets via bx 08/ 2018  , had chemo then partial hepatectomy and completed chemo after surgery/ liver mets recurrence 08/ 2019  . Cushing's disease [facility])    endocriniologist-- dr Elisabeth Most [facility])  . Droopy eyelid, right    11-15-2017  . GERD (gastroesophageal reflux disease)   . Goals of care, counseling/discussion 08/30/2017  . Gout    11-15-2017 per pt last flare-up 2018  . History of benign pituitary tumor    resection 07-25-2013  . History of cancer chemotherapy    COMPLETED 02-20-2017  FOR  COLON CANCER WITH LIVER METS  . History of radiation therapy    03/ 2016  remainder of pituitary tumor  . Hypertensive cardiovascular disease   . Hypothyroidism, secondary   . OA (osteoarthritis)   . Secondary adrenal insufficiency ([place])   . Secondary male hypogonadism   . Wears glasses   . Wears partial dentures    upper and lower    Past Surgical History:  Procedure Laterality Date  . COLOSTOMY TAKEDOWN  05-16-2016   @[facility]   . IR 3D INDEPENDENT [facility]KST  07/17/2018  . IR ANGIOGRAM SELECTIVE EACH ADDITIONAL VESSEL  07/17/2018  . IR ANGIOGRAM SELECTIVE EACH ADDITIONAL VESSEL  07/17/2018  . IR ANGIOGRAM SELECTIVE EACH ADDITIONAL VESSEL  07/17/2018  . IR ANGIOGRAM SELECTIVE EACH ADDITIONAL VESSEL  07/17/2018  . IR ANGIOGRAM SELECTIVE EACH ADDITIONAL VESSEL  07/17/2018  . IR ANGIOGRAM SELECTIVE  EACH ADDITIONAL VESSEL  07/30/2018  . IR ANGIOGRAM SELECTIVE EACH ADDITIONAL VESSEL  07/30/2018  . IR ANGIOGRAM SELECTIVE EACH ADDITIONAL VESSEL  07/30/2018  . IR ANGIOGRAM SELECTIVE EACH ADDITIONAL VESSEL  07/30/2018  . IR ANGIOGRAM SELECTIVE EACH ADDITIONAL VESSEL  07/30/2018  . IR ANGIOGRAM SELECTIVE EACH ADDITIONAL VESSEL  08/27/2018  . IR ANGIOGRAM SELECTIVE EACH ADDITIONAL VESSEL  08/27/2018  . IR ANGIOGRAM VISCERAL SELECTIVE  07/17/2018  . IR ANGIOGRAM VISCERAL SELECTIVE  07/17/2018  . IR ANGIOGRAM VISCERAL SELECTIVE  08/27/2018  . IR EMBO ARTERIAL NOT HEMORR HEMANG INC GUIDE ROADMAPPING  07/17/2018  . IR EMBO TUMOR ORGAN ISCHEMIA INFARCT INC GUIDE ROADMAPPING  07/30/2018  . IR EMBO TUMOR ORGAN ISCHEMIA INFARCT INC GUIDE ROADMAPPING  08/27/2018  . IR RADIOLOGIST EVAL & MGMT  10/10/2017  . IR RADIOLOGIST EVAL & MGMT  11/07/2017  . IR RADIOLOGIST EVAL & MGMT  12/21/2017  . IR RADIOLOGIST EVAL & MGMT  06/19/2018  . IR RADIOLOGIST EVAL & MGMT  09/19/2018  . IR US GUIDE VASC ACCESS RIGHT  07/17/2018  . IR US GUIDE VASC ACCESS RIGHT  07/30/2018  . IR US GUIDE VASC ACCESS RIGHT  08/27/2018  . OPEN PARTIAL HEPATECTOMY   01-13-2017    @[facility]    AND CHOLECYSTECTOMY  . RADIOFREQUENCY ABLATION N/A 11/22/2017   Procedure: CT MICROWAVE ABLATION LIVER;  Surgeon: Sandi Mariscal, MD;  Location: [facility];  Service: Anesthesiology;  Laterality: N/A;  . REVISION TOTAL KNEE ARTHROPLASTY Left 12/20/2013  . SUBTOTAL COLECTOMY  01-01-2016  @[facility]    W/  CREATION ILEOSTOMY AND UMBILICAL HERNIA REPAIR  . TOTAL KNEE ARTHROPLASTY Bilateral left 11-16-2010;  right 10-26-2011  . TRANSPHENOIDAL PITUITARY RESECTION  07-25-2013   @[facility]    Allergies: Patient has no known allergies.  Medications: Prior to Admission medications   Medication Sig Start Date End Date Taking? Authorizing Provider  allopurinol (ZYLOPRIM) 300 MG tablet Take 300 mg by mouth every morning.  01/28/10   [provider]  atorvastatin (LIPITOR) 40 MG  tablet Take 40 mg by mouth daily. 03/22/18   [provider]  carvedilol (COREG) 6.25 MG tablet TAKE 1 TABLET (6.25 MG TOTAL) BY MOUTH 2 TIMES DAILY WITH MEALS. 05/31/18   [provider]  [facility] ASPIRIN ADULT LOW DOSE 81 MG chewable tablet Chew 81 mg by mouth daily. 02/07/18   [provider]  [employer] STOOL SOFTENER/LAXATIVE 8.6-50 MG tablet TAKE 2 TABLETS EVERY EVENING 03/22/18   [provider]  ferrous sulfate 325 (65 FE) MG tablet Take 325 mg by mouth daily.    [provider]  furosemide (LASIX) 20 MG tablet Take 20 mg by mouth daily.     [provider]  hydrocortisone (CORTEF) 10 MG tablet Take 5-15 mg by mouth See admin instructions. Takes 15 mg in am and 5mg  in afternoon. 08/19/17   [provider]  levothyroxine (SYNTHROID, LEVOTHROID) 88 MCG tablet Take 88 mcg by mouth daily before breakfast.  05/15/17   [provider]  loperamide (IMODIUM) 2 MG capsule Take 2 mg by mouth as needed for diarrhea or loose stools.     [provider]  NASAL [facility] NA  Place 1 spray into the nose daily.     [provider]  pantoprazole (PROTONIX) 40 MG tablet Take 40 mg by mouth every morning.  05/16/14   [provider]  polyvinyl alcohol (LIQUIFILM TEARS) 1.4 % ophthalmic solution Place 1 drop into both eyes daily as needed for dry eyes.     [provider]  potassium chloride (KLOR-CON 10) 10 MEQ tablet Take 20 mEq by mouth daily. 11/17/14   [provider]  prochlorperazine (COMPAZINE) 10 MG tablet Take 10 mg by mouth every 8 (eight) hours as needed for nausea or vomiting.  09/01/16   [provider]  testosterone (ANDRODERM) 4 MG/24HR PT24 patch Place 1 patch onto the skin daily.  04/17/15   [provider]  warfarin (COUMADIN) 5 MG tablet Take 5 mg by mouth daily. 07/05/17   [provider]     No family history on file.  Social History   Socioeconomic History  . Marital  status: Married    Spouse name: Not on file  . Number of children: Not on file  . Years of education: Not on file  . Highest education level: Not on file  Occupational History  . Not on file  Tobacco Use  . Smoking status: Never Smoker  . Smokeless tobacco: Never Used  Substance and Sexual Activity  . Alcohol use: Not Currently  . Drug use: Never  . Sexual activity: Not on file  Other Topics Concern  . Not on file  Social History Narrative  . Not on file   Social Determinants of Health   Financial Resource Strain:   . Difficulty of Paying Living Expenses: Not on file  Food Insecurity:   . Worried About Charity fundraiser in the Last Year: Not on file  . Ran Out of Food in the Last Year: Not on file  Transportation Needs:   . Lack of Transportation (Medical): Not on file  . Lack of Transportation (Non-Medical): Not on file  Physical Activity:   . Days of Exercise per Week: Not on file  . Minutes of Exercise per Session: Not on file  Stress:   . Feeling of Stress : Not on file  Social Connections:   . Frequency of Communication with Friends and Family: Not on file  . Frequency of Social Gatherings with Friends and Family: Not on file  . Attends Religious Services: Not on file  . Active Member of Clubs or Organizations: Not on file  . Attends Archivist Meetings: Not on file  . Marital Status: Not on file    ECOG Status: 1 - Symptomatic but completely ambulatory  Review of Systems  Review of Systems: A 12 point ROS discussed and pertinent positives are indicated in the HPI above.  All other systems are negative.  Physical Exam No direct physical exam was performed (except for noted visual exam findings with Video Visits).   Vital Signs: There were no vitals taken for this visit.  Imaging:  Selected images from PET/CT performed 12/11/2018 as well as Y 90 radioembolization of the right lobe liver performed were reviewed in detail  NM PET Image Restag  (PS) Skull Base To Thigh  Result Date: 12/11/2018 CLINICAL DATA:  Subsequent treatment strategy for metastatic colon cancer. EXAM: NUCLEAR MEDICINE PET SKULL BASE TO THIGH TECHNIQUE: A 11.4 mCi F-18 FDG was injected intravenously. Full-ring PET imaging was performed from the skull base to thigh after the radiotracer. CT data was obtained and used  for attenuation correction and anatomic localization. Fasting blood glucose: 83 mg/dl COMPARISON:  02/15/2018 FINDINGS: Mediastinal blood pool activity: SUV max 2.9 Liver activity: SUV max NA NECK: Prominent uptake noted along the soft palate, indeterminate. No hypermetabolic cervical lymphadenopathy. Hypermetabolism again identified in the diffusely enlarged left thyroid lobe with SUV max = 10.8 today, stable since prior. Incidental CT findings: Both maxillary sinuses are opacified. CHEST: Scattered foci of FDG accumulation are identified along the posterior right pleural space with subtle small pleural soft tissue nodules evident (see images 75, 76 and 79 of series 4). No hypermetabolic mediastinal or hilar lymphadenopathy. No hypermetabolic pulmonary nodule or mass. Incidental CT findings: Trace bilateral pleural effusions. Heart size upper normal. Coronary artery calcification is evident. Atherosclerotic calcification is noted in the wall of the thoracic aorta. Right Port-A-Cath tip is positioned in the mid SVC. ABDOMEN/PELVIS: Hypermetabolic central liver lesion has progressed in the interval measuring 3.1 cm on CT imaging and demonstrating SUV max = 23.2 today which compares to 15.3 previously. Lesion was measured approximately 1.9 cm on the prior study. New hypermetabolism in the lateral aspect of the inferior right liver is associated with a 3.0 x 2.9 cm lesion visible on image 111/4. SUV max = 12.5. Low level uptake is identified in the region of the colonic anastomosis, likely due to scarring.No hypermetabolic lymphadenopathy in the abdomen. Incidental CT  findings: Embolization coils noted right upper quadrant. Left renal cyst is similar to prior There is abdominal aortic atherosclerosis without aneurysm. SKELETON: Focal hypermetabolism is identified in the left aspect of the L1 inferior endplate, likely related to spurring. Similar focal uptake is identified along the left L3-4 interspace, likely degenerative.123. Incidental CT findings: none IMPRESSION: 1. Interval progression of hypermetabolic liver metastases. The new hypermetabolism today is identified in the region of the inferior right hepatic lobe ablation bed. 2. New subtle soft tissue nodules along the posterolateral right pleural space show hypermetabolism. Finding is indeterminate but early metastatic disease not excluded and close attention on follow-up recommended. 3. Focal FDG accumulation in the left aspect of the L1 inferior endplate and lateral aspect of the left L3-4 interspace, likely related to degenerative spurring at these levels as no underlying bony lesions are evident. Close attention on follow-up recommended. 4.  Aortic Atherosclerois (ICD10-170.0) the Electronically Signed   By: Misty Stanley M.D.   On: 12/11/2018 14:21    Labs:  CBC: Recent Labs    07/30/18 0826 08/27/18 0833 09/14/18 0916 12/14/18 1020  WBC 6.3 6.0 6.4 4.8  HGB 12.8* 13.4 13.7 13.3  HCT 40.9 41.8 42.3 40.2  PLT 128* 113* 133* 96*    COAGS: Recent Labs    07/17/18 0847 07/30/18 0826 08/27/18 0833  INR 1.2 1.1 1.1  APTT 41*  --   --     BMP: Recent Labs    07/30/18 0826 08/27/18 0833 09/14/18 0916 12/14/18 1020  NA 143 142 141 142  K 3.7 3.6 4.0 4.0  CL 107 108 105 107  CO2 26 27 29 29   GLUCOSE 108* 115* 97 97  BUN 22 27* 18 20  CALCIUM 8.6* 8.8* 9.2 8.9  CREATININE 1.36* 1.16 1.17 1.17  GFRNONAA 52* >60 >60 >60  GFRAA >60 >60 >60 >60    LIVER FUNCTION TESTS: Recent Labs    07/30/18 0826 08/27/18 0833 09/14/18 0916 12/14/18 1020  BILITOT 0.8 1.1 0.9 0.7  AST 23 31 52*  71*  ALT 22 29 55* 49*  ALKPHOS 88 95 186* 252*  PROT 6.6 6.6 6.3* 6.4*  ALBUMIN 3.8 3.8 3.7 3.8    TUMOR MARKERS:    Assessment and Plan:  JACHAI KENNER is a 73 y.o. male with past medical history significant for atrial fibrillation(on Coumadin), pituitary tumor (post resection) with chronic steroid supplementation, hypertension and hypothyroidism who is post image guided microwave ablation of two liver lesions on 11/22/2017 and subsequent Y 90 radioembolization with completion radioembolization of the right lobe of the liver performed 08/27/2018.  Unfortunately, PET/CT performed 12/11/2018 demonstrates worsening hypermetabolic activity within both the right and left lobes of liver worrisome for progression of hepatic metastatic disease.  This finding is associated with further elevation of the patient's CEA level, most recently 229 (obtained on 12/14/2018).  I held conversations with Dr. Marin Olp today regarding the appropriateness of proceeding with repeat Y 90 radioembolization as the patient's bilirubin level has remained normal despite his previous interventions.  Following this conversation, Dr. Marin Olp wishes to pursue systemic therapy at this time but knows to refer the patient back to interventional radiology if liver directed therapy if desired in the future.  Above was discussed in great length with the patient and the patient's wife who are in agreement with the above plan of care.   They know to call the interventional radiology clinic with any future questions or concerns though may otherwise follow-up on a as needed basis.   Thank you for this interesting consult.  I greatly enjoyed meeting Zachary Willis and look forward to participating in their care.  A copy of this report was sent to the requesting provider on this date.  Electronically Signed: Sandi Mariscal 12/18/2018, 10:59 AM   I spent a total of 15 Minutes in remote  clinical consultation, greater than 50% of which  was counseling/coordinating care for metastatic colorectal cancer.    Visit type: Audio only (telephone). Audio (no video) only due to patient's lack of internet/smartphone capability. Alternative for in-person consultation at [facility], [address], [place], [place]. This visit type was conducted due to national recommendations for restrictions regarding the COVID-19 Pandemic (e.g. social distancing).  This format is felt to be most appropriate for this patient at this time.  All issues noted in this document were discussed and addressed.

## 2018-12-19 ENCOUNTER — Other Ambulatory Visit: Payer: Self-pay | Admitting: *Deleted

## 2018-12-19 DIAGNOSIS — C189 Malignant neoplasm of colon, unspecified: Secondary | ICD-10-CM

## 2018-12-19 MED ORDER — LORAZEPAM 1 MG PO TABS: 1.0000 mg | ORAL_TABLET | Freq: Four times a day (QID) | ORAL | 0 refills | Status: DC | PRN

## 2019-01-02 ENCOUNTER — Telehealth: Payer: Self-pay | Admitting: *Deleted

## 2019-01-02 NOTE — Telephone Encounter (Signed)
Received a call from [facility], patients wife letting us know that patient was treated with antibiotics for Cellulitis by his primary care physician last week.  This is much better and antibiotics are almost complete.  Wanted to know if this will interfere with chemo scheduled for next week.  This will not interfere per Dr Marin Olp.  Reviewed nausea meds with [name] for post chemo.

## 2019-01-08 ENCOUNTER — Other Ambulatory Visit: Payer: Self-pay | Admitting: *Deleted

## 2019-01-08 DIAGNOSIS — C787 Secondary malignant neoplasm of liver and intrahepatic bile duct: Secondary | ICD-10-CM

## 2019-01-08 DIAGNOSIS — C189 Malignant neoplasm of colon, unspecified: Secondary | ICD-10-CM

## 2019-01-09 ENCOUNTER — Inpatient Hospital Stay: Payer: Medicare HMO

## 2019-01-09 ENCOUNTER — Inpatient Hospital Stay: Payer: Medicare HMO | Attending: Hematology & Oncology

## 2019-01-09 ENCOUNTER — Other Ambulatory Visit: Payer: Self-pay

## 2019-01-09 DIAGNOSIS — C787 Secondary malignant neoplasm of liver and intrahepatic bile duct: Secondary | ICD-10-CM | POA: Diagnosis not present

## 2019-01-09 DIAGNOSIS — C189 Malignant neoplasm of colon, unspecified: Secondary | ICD-10-CM | POA: Insufficient documentation

## 2019-01-09 DIAGNOSIS — Z5111 Encounter for antineoplastic chemotherapy: Secondary | ICD-10-CM | POA: Insufficient documentation

## 2019-01-09 LAB — CBC WITH DIFFERENTIAL (CANCER CENTER ONLY)
Abs Immature Granulocytes: 0.04 10*3/uL (ref 0.00–0.07)
Basophils Absolute: 0.1 10*3/uL (ref 0.0–0.1)
Basophils Relative: 1 %
Eosinophils Absolute: 0.2 10*3/uL (ref 0.0–0.5)
Eosinophils Relative: 4 %
HCT: 39.5 % (ref 39.0–52.0)
Hemoglobin: 12.8 g/dL — ABNORMAL LOW (ref 13.0–17.0)
Immature Granulocytes: 1 %
Lymphocytes Relative: 19 %
Lymphs Abs: 1.2 10*3/uL (ref 0.7–4.0)
MCH: 29.3 pg (ref 26.0–34.0)
MCHC: 32.4 g/dL (ref 30.0–36.0)
MCV: 90.4 fL (ref 80.0–100.0)
Monocytes Absolute: 0.6 10*3/uL (ref 0.1–1.0)
Monocytes Relative: 9 %
Neutro Abs: 4.3 10*3/uL (ref 1.7–7.7)
Neutrophils Relative %: 66 %
Platelet Count: 92 10*3/uL — ABNORMAL LOW (ref 150–400)
RBC: 4.37 MIL/uL (ref 4.22–5.81)
RDW: 16.2 % — ABNORMAL HIGH (ref 11.5–15.5)
WBC Count: 6.4 10*3/uL (ref 4.0–10.5)
nRBC: 0 % (ref 0.0–0.2)

## 2019-01-09 LAB — CMP (CANCER CENTER ONLY)
ALT: 48 U/L — ABNORMAL HIGH (ref 0–44)
AST: 51 U/L — ABNORMAL HIGH (ref 15–41)
Albumin: 3.5 g/dL (ref 3.5–5.0)
Alkaline Phosphatase: 201 U/L — ABNORMAL HIGH (ref 38–126)
Anion gap: 6 (ref 5–15)
BUN: 23 mg/dL (ref 8–23)
CO2: 31 mmol/L (ref 22–32)
Calcium: 9 mg/dL (ref 8.9–10.3)
Chloride: 105 mmol/L (ref 98–111)
Creatinine: 1.15 mg/dL (ref 0.61–1.24)
GFR, Est AFR Am: >60 mL/min (ref >60)
GFR, Estimated: >60 mL/min (ref >60)
Glucose, Bld: 106 mg/dL — ABNORMAL HIGH (ref 70–99)
Potassium: 3.8 mmol/L (ref 3.5–5.1)
Sodium: 142 mmol/L (ref 135–145)
Total Bilirubin: 0.7 mg/dL (ref 0.3–1.2)
Total Protein: 6.2 g/dL — ABNORMAL LOW (ref 6.5–8.1)

## 2019-01-09 LAB — LACTATE DEHYDROGENASE: LDH: 278 U/L — ABNORMAL HIGH (ref 98–192)

## 2019-01-09 MED ORDER — PALONOSETRON HCL INJECTION 0.25 MG/5ML
0.2500 mg | Freq: Once | INTRAVENOUS | Status: AC
  Administered 2019-01-09: 0.25 mg via INTRAVENOUS

## 2019-01-09 MED ORDER — FLUOROURACIL CHEMO INJECTION 2.5 GM/50ML
400.0000 mg/m2 | Freq: Once | INTRAVENOUS | Status: AC
  Administered 2019-01-09: 950 mg via INTRAVENOUS
  Filled 2019-01-09: qty 19

## 2019-01-09 MED ORDER — SODIUM CHLORIDE 0.9 % IV SOLN
Freq: Once | INTRAVENOUS | Status: AC
  Filled 2019-01-09: qty 250

## 2019-01-09 MED ORDER — SODIUM CHLORIDE 0.9 % IV SOLN: 2160.0000 mg/m2 | INTRAVENOUS | Status: DC

## 2019-01-09 MED ORDER — SODIUM CHLORIDE 0.9 % IV SOLN
2140.0000 mg/m2 | Freq: Once | INTRAVENOUS | Status: DC
  Administered 2019-01-09: 5000 mg via INTRAVENOUS
  Filled 2019-01-09: qty 100

## 2019-01-09 MED ORDER — SODIUM CHLORIDE 0.9 % IV SOLN
400.0000 mg/m2 | Freq: Once | INTRAVENOUS | Status: AC
  Administered 2019-01-09: 936 mg via INTRAVENOUS
  Filled 2019-01-09: qty 46.8

## 2019-01-09 MED ORDER — DEXAMETHASONE SODIUM PHOSPHATE 10 MG/ML IJ SOLN
INTRAMUSCULAR | Status: AC
  Filled 2019-01-09: qty 1

## 2019-01-09 MED ORDER — SODIUM CHLORIDE 0.9 % IV SOLN
162.0000 mg/m2 | Freq: Once | INTRAVENOUS | Status: AC
  Administered 2019-01-09: 380 mg via INTRAVENOUS
  Filled 2019-01-09: qty 15

## 2019-01-09 MED ORDER — DEXAMETHASONE SODIUM PHOSPHATE 10 MG/ML IJ SOLN
10.0000 mg | Freq: Once | INTRAMUSCULAR | Status: AC
  Administered 2019-01-09: 10 mg via INTRAVENOUS

## 2019-01-09 MED ORDER — PALONOSETRON HCL INJECTION 0.25 MG/5ML
INTRAVENOUS | Status: AC
  Filled 2019-01-09: qty 5

## 2019-01-09 MED ORDER — ATROPINE SULFATE 1 MG/ML IJ SOLN: 0.5000 mg | Freq: Once | INTRAMUSCULAR | Status: DC | PRN

## 2019-01-09 MED ORDER — SODIUM CHLORIDE 0.9 % IV SOLN: 2160.0000 mg/m2 | Freq: Once | INTRAVENOUS | Status: DC

## 2019-01-09 MED ORDER — ATROPINE SULFATE 1 MG/ML IJ SOLN
INTRAMUSCULAR | Status: AC
  Filled 2019-01-09: qty 1

## 2019-01-09 NOTE — Patient Instructions (Signed)
Leucovorin injection What is this medicine? LEUCOVORIN (loo koe VOR in) is used to prevent or treat the harmful effects of some medicines. This medicine is used to treat anemia caused by a low amount of folic acid in the body. It is also used with 5-fluorouracil (5-FU) to treat colon cancer. This medicine may be used for other purposes; ask your health care provider or pharmacist if you have questions. What should I tell my health care provider before I take this medicine? They need to know if you have any of these conditions:  anemia from low levels of vitamin B-12 in the blood  an unusual or allergic reaction to leucovorin, folic acid, other medicines, foods, dyes, or preservatives  pregnant or trying to get pregnant  breast-feeding How should I use this medicine? This medicine is for injection into a muscle or into a vein. It is given by a health care professional in a hospital or clinic setting. Talk to your pediatrician regarding the use of this medicine in children. Special care may be needed. Overdosage: If you think you have taken too much of this medicine contact a poison control center or emergency room at once. NOTE: This medicine is only for you. Do not share this medicine with others. What if I miss a dose? This does not apply. What may interact with this medicine?  capecitabine  fluorouracil  phenobarbital  phenytoin  primidone  trimethoprim-sulfamethoxazole This list may not describe all possible interactions. Give your health care provider a list of all the medicines, herbs, non-prescription drugs, or dietary supplements you use. Also tell them if you smoke, drink alcohol, or use illegal drugs. Some items may interact with your medicine. What should I watch for while using this medicine? Your condition will be monitored carefully while you are receiving this medicine. This medicine may increase the side effects of 5-fluorouracil, 5-FU. Tell your doctor or health  care professional if you have diarrhea or mouth sores that do not get better or that get worse. What side effects may I notice from receiving this medicine? Side effects that you should report to your doctor or health care professional as soon as possible:  allergic reactions like skin rash, itching or hives, swelling of the face, lips, or tongue  breathing problems  fever, infection  mouth sores  unusual bleeding or bruising  unusually weak or tired Side effects that usually do not require medical attention (report to your doctor or health care professional if they continue or are bothersome):  constipation or diarrhea  loss of appetite  nausea, vomiting This list may not describe all possible side effects. Call your doctor for medical advice about side effects. You may report side effects to FDA at 1-800-FDA-1088. Where should I keep my medicine? This drug is given in a hospital or clinic and will not be stored at home. NOTE: This sheet is a summary. It may not cover all possible information. If you have questions about this medicine, talk to your doctor, pharmacist, or health care provider.  2020 Elsevier/Gold Standard (2007-06-26 16:50:29) Irinotecan injection What is this medicine? IRINOTECAN (ir in oh TEE kan ) is a chemotherapy drug. It is used to treat colon and rectal cancer. This medicine may be used for other purposes; ask your health care provider or pharmacist if you have questions. COMMON BRAND NAME(S): Camptosar What should I tell my health care provider before I take this medicine? They need to know if you have any of these conditions:  dehydration  diarrhea  infection (especially a virus infection such as chickenpox, cold sores, or herpes)  liver disease  low blood counts, like low white cell, platelet, or red cell counts  low levels of calcium, magnesium, or potassium in the blood  recent or ongoing radiation therapy  an unusual or allergic reaction  to irinotecan, other medicines, foods, dyes, or preservatives  pregnant or trying to get pregnant  breast-feeding How should I use this medicine? This drug is given as an infusion into a vein. It is administered in a hospital or clinic by a specially trained health care professional. Talk to your pediatrician regarding the use of this medicine in children. Special care may be needed. Overdosage: If you think you have taken too much of this medicine contact a poison control center or emergency room at once. NOTE: This medicine is only for you. Do not share this medicine with others. What if I miss a dose? It is important not to miss your dose. Call your doctor or health care professional if you are unable to keep an appointment. What may interact with this medicine? This medicine may interact with the following medications:  antiviral medicines for HIV or AIDS  certain antibiotics like rifampin or rifabutin  certain medicines for fungal infections like itraconazole, ketoconazole, posaconazole, and voriconazole  certain medicines for seizures like carbamazepine, phenobarbital, phenotoin  clarithromycin  gemfibrozil  nefazodone  St. John's Wort This list may not describe all possible interactions. Give your health care provider a list of all the medicines, herbs, non-prescription drugs, or dietary supplements you use. Also tell them if you smoke, drink alcohol, or use illegal drugs. Some items may interact with your medicine. What should I watch for while using this medicine? Your condition will be monitored carefully while you are receiving this medicine. You will need important blood work done while you are taking this medicine. This drug may make you feel generally unwell. This is not uncommon, as chemotherapy can affect healthy cells as well as cancer cells. Report any side effects. Continue your course of treatment even though you feel ill unless your doctor tells you to stop. In  some cases, you may be given additional medicines to help with side effects. Follow all directions for their use. You may get drowsy or dizzy. Do not drive, use machinery, or do anything that needs mental alertness until you know how this medicine affects you. Do not stand or sit up quickly, especially if you are an older patient. This reduces the risk of dizzy or fainting spells. Call your health care professional for advice if you get a fever, chills, or sore throat, or other symptoms of a cold or flu. Do not treat yourself. This medicine decreases your body's ability to fight infections. Try to avoid being around people who are sick. Avoid taking products that contain aspirin, acetaminophen, ibuprofen, naproxen, or ketoprofen unless instructed by your doctor. These medicines may hide a fever. This medicine may increase your risk to bruise or bleed. Call your doctor or health care professional if you notice any unusual bleeding. Be careful brushing and flossing your teeth or using a toothpick because you may get an infection or bleed more easily. If you have any dental work done, tell your dentist you are receiving this medicine. Do not become pregnant while taking this medicine or for 6 months after stopping it. Women should inform their health care professional if they wish to become pregnant or think they might be pregnant. Men should  not father a child while taking this medicine and for 3 months after stopping it. There is potential for serious side effects to an unborn child. Talk to your health care professional for more information. Do not breast-feed an infant while taking this medicine or for 7 days after stopping it. This medicine has caused ovarian failure in some women. This medicine may make it more difficult to get pregnant. Talk to your health care professional if you are concerned about your fertility. This medicine has caused decreased sperm counts in some men. This may make it more  difficult to father a child. Talk to your health care professional if you are concerned about your fertility. What side effects may I notice from receiving this medicine? Side effects that you should report to your doctor or health care professional as soon as possible:  allergic reactions like skin rash, itching or hives, swelling of the face, lips, or tongue  chest pain  diarrhea  flushing, runny nose, sweating during infusion  low blood counts - this medicine may decrease the number of white blood cells, red blood cells and platelets. You may be at increased risk for infections and bleeding.  nausea, vomiting  pain, swelling, warmth in the leg  signs of decreased platelets or bleeding - bruising, pinpoint red spots on the skin, black, tarry stools, blood in the urine  signs of infection - fever or chills, cough, sore throat, pain or difficulty passing urine  signs of decreased red blood cells - unusually weak or tired, fainting spells, lightheadedness Side effects that usually do not require medical attention (report to your doctor or health care professional if they continue or are bothersome):  constipation  hair loss  headache  loss of appetite  mouth sores  stomach pain This list may not describe all possible side effects. Call your doctor for medical advice about side effects. You may report side effects to FDA at 1-800-FDA-1088. Where should I keep my medicine? This drug is given in a hospital or clinic and will not be stored at home. NOTE: This sheet is a summary. It may not cover all possible information. If you have questions about this medicine, talk to your doctor, pharmacist, or health care provider.  2020 Elsevier/Gold Standard (2018-02-09 10:09:17)

## 2019-01-09 NOTE — Patient Instructions (Signed)

## 2019-01-09 NOTE — Progress Notes (Signed)
Ok to treat with platelets of 92 per DR Ennever. dph

## 2019-01-10 ENCOUNTER — Other Ambulatory Visit: Payer: Self-pay | Admitting: Hematology & Oncology

## 2019-01-10 ENCOUNTER — Telehealth: Payer: Self-pay | Admitting: *Deleted

## 2019-01-10 NOTE — Telephone Encounter (Signed)
Received a call from patients wife Meredith Mody asking if ok to take Dexamethasone as prescribed since patient is on Cortef.  Ok per Dr Marin Olp

## 2019-01-11 ENCOUNTER — Other Ambulatory Visit: Payer: Self-pay

## 2019-01-11 ENCOUNTER — Inpatient Hospital Stay: Payer: Medicare HMO

## 2019-01-11 VITALS — BP 153/86 | HR 61 | Temp 97.5°F | Resp 18

## 2019-01-11 DIAGNOSIS — Z5111 Encounter for antineoplastic chemotherapy: Secondary | ICD-10-CM | POA: Diagnosis not present

## 2019-01-11 DIAGNOSIS — C787 Secondary malignant neoplasm of liver and intrahepatic bile duct: Secondary | ICD-10-CM

## 2019-01-11 DIAGNOSIS — C189 Malignant neoplasm of colon, unspecified: Secondary | ICD-10-CM

## 2019-01-11 MED ORDER — SODIUM CHLORIDE 0.9% FLUSH
10.0000 mL | INTRAVENOUS | Status: DC | PRN
  Administered 2019-01-11: 10 mL
  Filled 2019-01-11: qty 10

## 2019-01-11 MED ORDER — HEPARIN SOD (PORK) LOCK FLUSH 100 UNIT/ML IV SOLN
500.0000 [IU] | Freq: Once | INTRAVENOUS | Status: AC | PRN
  Administered 2019-01-11: 500 [IU]
  Filled 2019-01-11: qty 5

## 2019-01-23 ENCOUNTER — Inpatient Hospital Stay (HOSPITAL_BASED_OUTPATIENT_CLINIC_OR_DEPARTMENT_OTHER): Payer: Medicare HMO | Admitting: Hematology & Oncology

## 2019-01-23 ENCOUNTER — Inpatient Hospital Stay: Payer: Medicare HMO

## 2019-01-23 ENCOUNTER — Other Ambulatory Visit: Payer: Self-pay

## 2019-01-23 ENCOUNTER — Encounter: Payer: Self-pay | Admitting: Hematology & Oncology

## 2019-01-23 VITALS — BP 115/73 | HR 70 | Temp 96.9°F | Resp 18 | Wt 245.0 lb

## 2019-01-23 DIAGNOSIS — C189 Malignant neoplasm of colon, unspecified: Secondary | ICD-10-CM

## 2019-01-23 DIAGNOSIS — C787 Secondary malignant neoplasm of liver and intrahepatic bile duct: Secondary | ICD-10-CM

## 2019-01-23 DIAGNOSIS — Z5111 Encounter for antineoplastic chemotherapy: Secondary | ICD-10-CM | POA: Diagnosis not present

## 2019-01-23 LAB — CBC WITH DIFFERENTIAL (CANCER CENTER ONLY)
Abs Immature Granulocytes: 0.01 10*3/uL (ref 0.00–0.07)
Basophils Absolute: 0 10*3/uL (ref 0.0–0.1)
Basophils Relative: 1 %
Eosinophils Absolute: 0.2 10*3/uL (ref 0.0–0.5)
Eosinophils Relative: 4 %
HCT: 38.7 % — ABNORMAL LOW (ref 39.0–52.0)
Hemoglobin: 12.6 g/dL — ABNORMAL LOW (ref 13.0–17.0)
Immature Granulocytes: 0 %
Lymphocytes Relative: 29 %
Lymphs Abs: 1.2 10*3/uL (ref 0.7–4.0)
MCH: 29.2 pg (ref 26.0–34.0)
MCHC: 32.6 g/dL (ref 30.0–36.0)
MCV: 89.6 fL (ref 80.0–100.0)
Monocytes Absolute: 0.3 10*3/uL (ref 0.1–1.0)
Monocytes Relative: 8 %
Neutro Abs: 2.4 10*3/uL (ref 1.7–7.7)
Neutrophils Relative %: 58 %
Platelet Count: 112 10*3/uL — ABNORMAL LOW (ref 150–400)
RBC: 4.32 MIL/uL (ref 4.22–5.81)
RDW: 16.6 % — ABNORMAL HIGH (ref 11.5–15.5)
WBC Count: 4.2 10*3/uL (ref 4.0–10.5)
nRBC: 0 % (ref 0.0–0.2)

## 2019-01-23 LAB — CMP (CANCER CENTER ONLY)
ALT: 34 U/L (ref 0–44)
AST: 31 U/L (ref 15–41)
Albumin: 3.5 g/dL (ref 3.5–5.0)
Alkaline Phosphatase: 197 U/L — ABNORMAL HIGH (ref 38–126)
Anion gap: 6 (ref 5–15)
BUN: 24 mg/dL — ABNORMAL HIGH (ref 8–23)
CO2: 30 mmol/L (ref 22–32)
Calcium: 9 mg/dL (ref 8.9–10.3)
Chloride: 105 mmol/L (ref 98–111)
Creatinine: 1.24 mg/dL (ref 0.61–1.24)
GFR, Est AFR Am: >60 mL/min (ref >60)
GFR, Estimated: 58 mL/min — ABNORMAL LOW (ref >60)
Glucose, Bld: 100 mg/dL — ABNORMAL HIGH (ref 70–99)
Potassium: 4 mmol/L (ref 3.5–5.1)
Sodium: 141 mmol/L (ref 135–145)
Total Bilirubin: 0.6 mg/dL (ref 0.3–1.2)
Total Protein: 6.1 g/dL — ABNORMAL LOW (ref 6.5–8.1)

## 2019-01-23 LAB — LACTATE DEHYDROGENASE: LDH: 208 U/L — ABNORMAL HIGH (ref 98–192)

## 2019-01-23 MED ORDER — SODIUM CHLORIDE 0.9 % IV SOLN
400.0000 mg/m2 | Freq: Once | INTRAVENOUS | Status: AC
  Administered 2019-01-23: 936 mg via INTRAVENOUS
  Filled 2019-01-23: qty 46.8

## 2019-01-23 MED ORDER — SODIUM CHLORIDE 0.9 % IV SOLN
162.0000 mg/m2 | Freq: Once | INTRAVENOUS | Status: AC
  Administered 2019-01-23: 380 mg via INTRAVENOUS
  Filled 2019-01-23: qty 15

## 2019-01-23 MED ORDER — SODIUM CHLORIDE 0.9 % IV SOLN
Freq: Once | INTRAVENOUS | Status: AC
  Filled 2019-01-23: qty 250

## 2019-01-23 MED ORDER — PALONOSETRON HCL INJECTION 0.25 MG/5ML
INTRAVENOUS | Status: AC
  Filled 2019-01-23: qty 5

## 2019-01-23 MED ORDER — PALONOSETRON HCL INJECTION 0.25 MG/5ML
0.2500 mg | Freq: Once | INTRAVENOUS | Status: AC
  Administered 2019-01-23: 0.25 mg via INTRAVENOUS

## 2019-01-23 MED ORDER — FLUOROURACIL CHEMO INJECTION 2.5 GM/50ML
400.0000 mg/m2 | Freq: Once | INTRAVENOUS | Status: AC
  Administered 2019-01-23: 14:00:00 950 mg via INTRAVENOUS
  Filled 2019-01-23: qty 19

## 2019-01-23 MED ORDER — DEXAMETHASONE SODIUM PHOSPHATE 10 MG/ML IJ SOLN
10.0000 mg | Freq: Once | INTRAMUSCULAR | Status: AC
  Administered 2019-01-23: 10 mg via INTRAVENOUS

## 2019-01-23 MED ORDER — SODIUM CHLORIDE 0.9 % IV SOLN
2140.0000 mg/m2 | INTRAVENOUS | Status: DC
  Administered 2019-01-23: 5000 mg via INTRAVENOUS
  Filled 2019-01-23: qty 100

## 2019-01-23 MED ORDER — DEXAMETHASONE SODIUM PHOSPHATE 10 MG/ML IJ SOLN
INTRAMUSCULAR | Status: AC
  Filled 2019-01-23: qty 1

## 2019-01-23 NOTE — Progress Notes (Signed)
Hematology and Oncology Follow Up Visit  Zachary Willis [id number] [birth year] 74 y.o. 01/23/2019   Principle Diagnosis:  Metastatic colon cancer - progressive  Current Therapy:   Status post RFA to the liver-11/21/2017  S/P Y-90 x 2 -- last therapy on 08/27/2018 FOLFIRI --  S/p cycle #1-- start on 01/09/2019    Interim History:  Zachary Willis is back for follow-up.  He tolerated his first cycle of chemotherapy quite well.  He really had very few in the way of side effects.  He had a little bit of diarrhea.  Otherwise, there is been no problems with bleeding.  He is on Coumadin.  He has had no cardiac issues.  There is been no problems with leg swelling.  He has had no focal weakness.  He has had no headache.  There is been no mouth sores.  His initial CEA level was 229.  I would like to hope that this will be down quite a bit now.  We will see what the CEA level is tomorrow.  There is been no problems with fever.  He did have a nice Christmas and New Year's.  Overall, his performance status is ECOG 1.    Medications:  Current Outpatient Medications:  .  allopurinol (ZYLOPRIM) 300 MG tablet, Take 300 mg by mouth every morning. , Disp: , Rfl:  .  atorvastatin (LIPITOR) 40 MG tablet, Take 40 mg by mouth daily., Disp: , Rfl:  .  carvedilol (COREG) 6.25 MG tablet, TAKE 1 TABLET (6.25 MG TOTAL) BY MOUTH 2 TIMES DAILY WITH MEALS., Disp: , Rfl:  .  [facility] ASPIRIN ADULT LOW DOSE 81 MG chewable tablet, Chew 81 mg by mouth daily., Disp: , Rfl:  .  [facility] STOOL SOFTENER/LAXATIVE 8.6-50 MG tablet, TAKE 2 TABLETS EVERY EVENING, Disp: , Rfl:  .  dexamethasone (DECADRON) 4 MG tablet, Take 2 tablets (8 mg total) by mouth daily. Start the day after chemo for 2 days., Disp: 8 tablet, Rfl: 5 .  ferrous sulfate 325 (65 FE) MG tablet, Take 325 mg by mouth daily., Disp: , Rfl:  .  furosemide (LASIX) 20 MG tablet, Take 20 mg by mouth daily. , Disp: , Rfl:  .  hydrocortisone (CORTEF) 10 MG tablet, Take 5-15 mg by  mouth See admin instructions. Takes 15 mg in am and 5mg  in afternoon., Disp: , Rfl: 4 .  levothyroxine (SYNTHROID, LEVOTHROID) 88 MCG tablet, Take 88 mcg by mouth daily before breakfast. , Disp: , Rfl:  .  lidocaine-prilocaine (EMLA) cream, Apply to affected area once, Disp: 30 g, Rfl: 3 .  loperamide (IMODIUM A-D) 2 MG tablet, Take 1 tablet (2 mg total) by mouth 4 (four) times daily as needed. Take 2 at diarrhea onset , then 1 every 2hr until 12hrs with no BM. May take 2 every 4hrs at night. If diarrhea recurs repeat., Disp: 100 tablet, Rfl: 1 .  loperamide (IMODIUM) 2 MG capsule, Take 2 mg by mouth as needed for diarrhea or loose stools. , Disp: , Rfl:  .  LORazepam (ATIVAN) 1 MG tablet, Take 1 tablet (1 mg total) by mouth every 6 (six) hours as needed (NAUSEA)., Disp: 30 tablet, Rfl: 0 .  NASAL WASH NA, Place 1 spray into the nose daily. , Disp: , Rfl:  .  ondansetron (ZOFRAN) 8 MG tablet, Take 1 tablet (8 mg total) by mouth 2 (two) times daily as needed for refractory nausea / vomiting. Start on day 3 after chemotherapy., Disp: 30 tablet, Rfl:  1 .  pantoprazole (PROTONIX) 40 MG tablet, Take 40 mg by mouth every morning. , Disp: , Rfl:  .  polyvinyl alcohol (LIQUIFILM TEARS) 1.4 % ophthalmic solution, Place 1 drop into both eyes daily as needed for dry eyes. , Disp: , Rfl:  .  potassium chloride (KLOR-CON 10) 10 MEQ tablet, Take 20 mEq by mouth daily., Disp: , Rfl:  .  prochlorperazine (COMPAZINE) 10 MG tablet, Take 10 mg by mouth every 8 (eight) hours as needed for nausea or vomiting. , Disp: , Rfl:  .  prochlorperazine (COMPAZINE) 10 MG tablet, Take 1 tablet (10 mg total) by mouth every 6 (six) hours as needed (NAUSEA)., Disp: 30 tablet, Rfl: 1 .  testosterone (ANDRODERM) 4 MG/24HR PT24 patch, Place 1 patch onto the skin daily. , Disp: , Rfl:  .  warfarin (COUMADIN) 5 MG tablet, Take 5 mg by mouth daily., Disp: , Rfl: 0  Allergies: No Known Allergies  Past Medical History, Surgical history,  Social history, and Family History were reviewed and updated.  Review of Systems: Review of Systems  Constitutional: Negative.   HENT:  Negative.   Eyes: Negative.   Respiratory: Negative.   Cardiovascular: Negative.   Gastrointestinal: Negative.   Endocrine: Negative.   Genitourinary: Negative.    Musculoskeletal: Negative.   Skin: Negative.   Neurological: Negative.   Hematological: Negative.   Psychiatric/Behavioral: Negative.     Physical Exam:  weight is 245 lb (111.1 kg). His temporal temperature is 96.9 F (36.1 C) (abnormal). His blood pressure is 115/73 and his pulse is 70. His respiration is 18 and oxygen saturation is 98%.   Wt Readings from Last 3 Encounters:  01/23/19 245 lb (111.1 kg)  12/14/18 244 lb 1.9 oz (110.7 kg)  12/14/18 244 lb 12 oz (111 kg)    Physical Exam Vitals reviewed.  HENT:     Head: Normocephalic and atraumatic.  Eyes:     Pupils: Pupils are equal, round, and reactive to light.  Cardiovascular:     Rate and Rhythm: Normal rate and regular rhythm.     Heart sounds: Normal heart sounds.     Comments: Regular rate and rhythm with an occasional extra beat. Pulmonary:     Effort: Pulmonary effort is normal.     Breath sounds: Normal breath sounds.  Abdominal:     General: Bowel sounds are normal.     Palpations: Abdomen is soft.     Comments: Abdomen is soft.  He has multiple laparotomy scars.  There is no fluid wave.  There is no palpable liver or spleen tip.  Musculoskeletal:        General: No tenderness or deformity. Normal range of motion.     Cervical back: Normal range of motion.  Lymphadenopathy:     Cervical: No cervical adenopathy.  Skin:    General: Skin is warm and dry.     Findings: No erythema or rash.  Neurological:     Mental Status: He is alert and oriented to person, place, and time.  Psychiatric:        Behavior: Behavior normal.        Thought Content: Thought content normal.        Judgment: Judgment normal.       Lab Results  Component Value Date   WBC 4.2 01/23/2019   HGB 12.6 (L) 01/23/2019   HCT 38.7 (L) 01/23/2019   MCV 89.6 01/23/2019   PLT 112 (L) 01/23/2019     Chemistry  Component Value Date/Time   NA 141 01/23/2019 1010   K 4.0 01/23/2019 1010   CL 105 01/23/2019 1010   CO2 30 01/23/2019 1010   BUN 24 (H) 01/23/2019 1010   CREATININE 1.24 01/23/2019 1010      Component Value Date/Time   CALCIUM 9.0 01/23/2019 1010   ALKPHOS 197 (H) 01/23/2019 1010   AST 31 01/23/2019 1010   ALT 34 01/23/2019 1010   BILITOT 0.6 01/23/2019 1010       Impression and Plan: Mr. Imperatore is a 74 year old white male with metastatic colon cancer.  We will go ahead with his second cycle of chemotherapy today.  Again, the CEA will be critical.  I noted that his liver function studies were somewhat improved.  This has to be encouraging.  We will plan to get her back in 2 weeks for his third cycle of treatment.         [name], MD 1/20/202111:29 AM

## 2019-01-24 LAB — CEA (IN HOUSE-CHCC): CEA (CHCC-In House): 404.57 ng/mL — ABNORMAL HIGH (ref 0.00–5.00)

## 2019-01-25 ENCOUNTER — Inpatient Hospital Stay: Payer: Medicare HMO

## 2019-01-25 ENCOUNTER — Other Ambulatory Visit: Payer: Self-pay

## 2019-01-25 VITALS — BP 154/82 | HR 48 | Temp 97.8°F | Resp 17

## 2019-01-25 DIAGNOSIS — Z5111 Encounter for antineoplastic chemotherapy: Secondary | ICD-10-CM | POA: Diagnosis not present

## 2019-01-25 DIAGNOSIS — C189 Malignant neoplasm of colon, unspecified: Secondary | ICD-10-CM

## 2019-01-25 MED ORDER — HEPARIN SOD (PORK) LOCK FLUSH 100 UNIT/ML IV SOLN
500.0000 [IU] | Freq: Once | INTRAVENOUS | Status: AC | PRN
  Administered 2019-01-25: 500 [IU]
  Filled 2019-01-25: qty 5

## 2019-01-25 MED ORDER — SODIUM CHLORIDE 0.9% FLUSH
10.0000 mL | INTRAVENOUS | Status: DC | PRN
  Administered 2019-01-25: 13:00:00 10 mL
  Filled 2019-01-25: qty 10

## 2019-01-31 ENCOUNTER — Telehealth: Payer: Self-pay | Admitting: *Deleted

## 2019-01-31 NOTE — Telephone Encounter (Signed)
Message received from patient's wife stating that patient's port is red and would like a call back.  Call placed back to patient's wife.  Pt.'s wife states that when port was deaccessed on 01/25/19 it bled and now has a small red spot on the port the size of a pencil eraser, with a small scab in the center of the red spot.  Pt.'s wife states that pt has had no fever, chills, pain or swelling to the right chest or arm.  Instructed pt.'s wife to call office if redness worsens or if pt has any new complaints.  Pt.'s wife appreciative of call back and has no further questions at this time.

## 2019-02-06 ENCOUNTER — Inpatient Hospital Stay (HOSPITAL_BASED_OUTPATIENT_CLINIC_OR_DEPARTMENT_OTHER): Payer: Medicare HMO | Admitting: Hematology & Oncology

## 2019-02-06 ENCOUNTER — Other Ambulatory Visit: Payer: Self-pay

## 2019-02-06 ENCOUNTER — Inpatient Hospital Stay: Payer: Medicare HMO | Attending: Hematology & Oncology

## 2019-02-06 ENCOUNTER — Inpatient Hospital Stay: Payer: Medicare HMO

## 2019-02-06 ENCOUNTER — Telehealth: Payer: Self-pay

## 2019-02-06 ENCOUNTER — Encounter: Payer: Self-pay | Admitting: Hematology & Oncology

## 2019-02-06 VITALS — Wt 247.0 lb

## 2019-02-06 VITALS — BP 110/73 | HR 54 | Temp 98.8°F | Resp 17 | Ht 70.0 in

## 2019-02-06 DIAGNOSIS — C787 Secondary malignant neoplasm of liver and intrahepatic bile duct: Secondary | ICD-10-CM | POA: Insufficient documentation

## 2019-02-06 DIAGNOSIS — R748 Abnormal levels of other serum enzymes: Secondary | ICD-10-CM | POA: Insufficient documentation

## 2019-02-06 DIAGNOSIS — Z7901 Long term (current) use of anticoagulants: Secondary | ICD-10-CM | POA: Diagnosis not present

## 2019-02-06 DIAGNOSIS — R970 Elevated carcinoembryonic antigen [CEA]: Secondary | ICD-10-CM | POA: Insufficient documentation

## 2019-02-06 DIAGNOSIS — Z95828 Presence of other vascular implants and grafts: Secondary | ICD-10-CM

## 2019-02-06 DIAGNOSIS — Z5111 Encounter for antineoplastic chemotherapy: Secondary | ICD-10-CM | POA: Diagnosis present

## 2019-02-06 DIAGNOSIS — C189 Malignant neoplasm of colon, unspecified: Secondary | ICD-10-CM

## 2019-02-06 LAB — CMP (CANCER CENTER ONLY)
ALT: 57 U/L — ABNORMAL HIGH (ref 0–44)
AST: 48 U/L — ABNORMAL HIGH (ref 15–41)
Albumin: 3.2 g/dL — ABNORMAL LOW (ref 3.5–5.0)
Alkaline Phosphatase: 186 U/L — ABNORMAL HIGH (ref 38–126)
Anion gap: 6 (ref 5–15)
BUN: 17 mg/dL (ref 8–23)
CO2: 31 mmol/L (ref 22–32)
Calcium: 8.6 mg/dL — ABNORMAL LOW (ref 8.9–10.3)
Chloride: 105 mmol/L (ref 98–111)
Creatinine: 1.25 mg/dL — ABNORMAL HIGH (ref 0.61–1.24)
GFR, Est AFR Am: >60 mL/min (ref >60)
GFR, Estimated: 58 mL/min — ABNORMAL LOW (ref >60)
Glucose, Bld: 120 mg/dL — ABNORMAL HIGH (ref 70–99)
Potassium: 3.9 mmol/L (ref 3.5–5.1)
Sodium: 142 mmol/L (ref 135–145)
Total Bilirubin: 0.7 mg/dL (ref 0.3–1.2)
Total Protein: 5.5 g/dL — ABNORMAL LOW (ref 6.5–8.1)

## 2019-02-06 LAB — CBC WITH DIFFERENTIAL (CANCER CENTER ONLY)
Abs Immature Granulocytes: 0.03 10*3/uL (ref 0.00–0.07)
Basophils Absolute: 0 10*3/uL (ref 0.0–0.1)
Basophils Relative: 1 %
Eosinophils Absolute: 0.1 10*3/uL (ref 0.0–0.5)
Eosinophils Relative: 3 %
HCT: 37 % — ABNORMAL LOW (ref 39.0–52.0)
Hemoglobin: 12 g/dL — ABNORMAL LOW (ref 13.0–17.0)
Immature Granulocytes: 1 %
Lymphocytes Relative: 42 %
Lymphs Abs: 1.1 10*3/uL (ref 0.7–4.0)
MCH: 29.3 pg (ref 26.0–34.0)
MCHC: 32.4 g/dL (ref 30.0–36.0)
MCV: 90.2 fL (ref 80.0–100.0)
Monocytes Absolute: 0.3 10*3/uL (ref 0.1–1.0)
Monocytes Relative: 11 %
Neutro Abs: 1 10*3/uL — ABNORMAL LOW (ref 1.7–7.7)
Neutrophils Relative %: 42 %
Platelet Count: 99 10*3/uL — ABNORMAL LOW (ref 150–400)
RBC: 4.1 MIL/uL — ABNORMAL LOW (ref 4.22–5.81)
RDW: 17 % — ABNORMAL HIGH (ref 11.5–15.5)
WBC Count: 2.5 10*3/uL — ABNORMAL LOW (ref 4.0–10.5)
nRBC: 0.8 % — ABNORMAL HIGH (ref 0.0–0.2)

## 2019-02-06 LAB — CEA (IN HOUSE-CHCC): CEA (CHCC-In House): 310.62 ng/mL — ABNORMAL HIGH (ref 0.00–5.00)

## 2019-02-06 MED ORDER — SODIUM CHLORIDE 0.9 % IV SOLN
400.0000 mg/m2 | Freq: Once | INTRAVENOUS | Status: AC
  Administered 2019-02-06: 936 mg via INTRAVENOUS
  Filled 2019-02-06: qty 46.8

## 2019-02-06 MED ORDER — PALONOSETRON HCL INJECTION 0.25 MG/5ML
INTRAVENOUS | Status: AC
  Filled 2019-02-06: qty 5

## 2019-02-06 MED ORDER — SODIUM CHLORIDE 0.9% FLUSH
10.0000 mL | INTRAVENOUS | Status: DC | PRN
  Filled 2019-02-06: qty 10

## 2019-02-06 MED ORDER — SODIUM CHLORIDE 0.9 % IV SOLN
Freq: Once | INTRAVENOUS | Status: AC
  Filled 2019-02-06: qty 250

## 2019-02-06 MED ORDER — PALONOSETRON HCL INJECTION 0.25 MG/5ML
0.2500 mg | Freq: Once | INTRAVENOUS | Status: AC
  Administered 2019-02-06: 0.25 mg via INTRAVENOUS

## 2019-02-06 MED ORDER — SODIUM CHLORIDE 0.9% FLUSH
10.0000 mL | Freq: Once | INTRAVENOUS | Status: AC
  Administered 2019-02-06: 10 mL via INTRAVENOUS
  Filled 2019-02-06: qty 10

## 2019-02-06 MED ORDER — FLUOROURACIL CHEMO INJECTION 2.5 GM/50ML
400.0000 mg/m2 | Freq: Once | INTRAVENOUS | Status: AC
  Administered 2019-02-06: 950 mg via INTRAVENOUS
  Filled 2019-02-06: qty 19

## 2019-02-06 MED ORDER — HEPARIN SOD (PORK) LOCK FLUSH 100 UNIT/ML IV SOLN
500.0000 [IU] | Freq: Once | INTRAVENOUS | Status: DC | PRN
  Filled 2019-02-06: qty 5

## 2019-02-06 MED ORDER — DEXAMETHASONE SODIUM PHOSPHATE 10 MG/ML IJ SOLN
10.0000 mg | Freq: Once | INTRAMUSCULAR | Status: AC
  Administered 2019-02-06: 10 mg via INTRAVENOUS

## 2019-02-06 MED ORDER — DEXAMETHASONE SODIUM PHOSPHATE 10 MG/ML IJ SOLN
INTRAMUSCULAR | Status: AC
  Filled 2019-02-06: qty 1

## 2019-02-06 MED ORDER — SODIUM CHLORIDE 0.9 % IV SOLN
5000.0000 mg | INTRAVENOUS | Status: DC
  Administered 2019-02-06: 13:00:00 5000 mg via INTRAVENOUS
  Filled 2019-02-06: qty 100

## 2019-02-06 MED ORDER — SODIUM CHLORIDE 0.9 % IV SOLN
144.0000 mg/m2 | Freq: Once | INTRAVENOUS | Status: AC
  Administered 2019-02-06: 340 mg via INTRAVENOUS
  Filled 2019-02-06: qty 17

## 2019-02-06 NOTE — Progress Notes (Signed)
Hematology and Oncology Follow Up Visit  Zachary Willis [id number] November 08, 1945 74 y.o. 02/06/2019   Principle Diagnosis:  Metastatic colon cancer - progressive  Current Therapy:   Status post RFA to the liver-11/21/2017  S/P Y-90 x 2 -- last therapy on 08/27/2018 FOLFIRI --  S/p cycle #2-- start on 01/09/2019     Interim History:  Zachary Willis is back for follow-up.  Unfortunately, I think we may have a problem with respect to his response to chemotherapy.  His last CEA was 400.  This is higher.  I realize that there might be a delay with respect to response.  Clearly, the CEA today will really determine what needs to be done.  I am pretty sure that we have genetic markers on him.  I do not think there is anything there that is all that negative for prognostic point of view.  He has had no problems with nausea or vomiting.  He has had no cough or shortness of breath.  He just saw his cardiologist and got a good report.  There has been no problems with bleeding.  He is on Coumadin.  There is been no problems with fever.  He has had no leg swelling.  Overall, his performance status is ECOG .  Medications:  Current Outpatient Medications:  .  allopurinol (ZYLOPRIM) 300 MG tablet, Take 300 mg by mouth every morning. , Disp: , Rfl:  .  atorvastatin (LIPITOR) 40 MG tablet, Take 40 mg by mouth daily., Disp: , Rfl:  .  carvedilol (COREG) 6.25 MG tablet, TAKE 1 TABLET (6.25 MG TOTAL) BY MOUTH 2 TIMES DAILY WITH MEALS., Disp: , Rfl:  .  [facility] ASPIRIN ADULT LOW DOSE 81 MG chewable tablet, Chew 81 mg by mouth daily., Disp: , Rfl:  .  [facility] STOOL SOFTENER/LAXATIVE 8.6-50 MG tablet, TAKE 2 TABLETS EVERY EVENING, Disp: , Rfl:  .  dexamethasone (DECADRON) 4 MG tablet, Take 2 tablets (8 mg total) by mouth daily. Start the day after chemo for 2 days., Disp: 8 tablet, Rfl: 5 .  ferrous sulfate 325 (65 FE) MG tablet, Take 325 mg by mouth daily., Disp: , Rfl:  .  furosemide (LASIX) 20 MG tablet, Take 20 mg by  mouth daily. , Disp: , Rfl:  .  hydrocortisone (CORTEF) 10 MG tablet, Take 5-15 mg by mouth See admin instructions. Takes 15 mg in am and 5mg  in afternoon., Disp: , Rfl: 4 .  levothyroxine (SYNTHROID, LEVOTHROID) 88 MCG tablet, Take 88 mcg by mouth daily before breakfast. , Disp: , Rfl:  .  lidocaine-prilocaine (EMLA) cream, Apply to affected area once, Disp: 30 g, Rfl: 3 .  loperamide (IMODIUM A-D) 2 MG tablet, Take 1 tablet (2 mg total) by mouth 4 (four) times daily as needed. Take 2 at diarrhea onset , then 1 every 2hr until 12hrs with no BM. May take 2 every 4hrs at night. If diarrhea recurs repeat., Disp: 100 tablet, Rfl: 1 .  loperamide (IMODIUM) 2 MG capsule, Take 2 mg by mouth as needed for diarrhea or loose stools. , Disp: , Rfl:  .  LORazepam (ATIVAN) 1 MG tablet, Take 1 tablet (1 mg total) by mouth every 6 (six) hours as needed (NAUSEA)., Disp: 30 tablet, Rfl: 0 .  NASAL WASH NA, Place 1 spray into the nose daily. , Disp: , Rfl:  .  ondansetron (ZOFRAN) 8 MG tablet, Take 1 tablet (8 mg total) by mouth 2 (two) times daily as needed for refractory nausea /  vomiting. Start on day 3 after chemotherapy., Disp: 30 tablet, Rfl: 1 .  pantoprazole (PROTONIX) 40 MG tablet, Take 40 mg by mouth every morning. , Disp: , Rfl:  .  polyvinyl alcohol (LIQUIFILM TEARS) 1.4 % ophthalmic solution, Place 1 drop into both eyes daily as needed for dry eyes. , Disp: , Rfl:  .  potassium chloride (KLOR-CON 10) 10 MEQ tablet, Take 20 mEq by mouth daily., Disp: , Rfl:  .  prochlorperazine (COMPAZINE) 10 MG tablet, Take 10 mg by mouth every 8 (eight) hours as needed for nausea or vomiting. , Disp: , Rfl:  .  prochlorperazine (COMPAZINE) 10 MG tablet, Take 1 tablet (10 mg total) by mouth every 6 (six) hours as needed (NAUSEA)., Disp: 30 tablet, Rfl: 1 .  testosterone (ANDRODERM) 4 MG/24HR PT24 patch, Place 1 patch onto the skin daily. , Disp: , Rfl:  .  warfarin (COUMADIN) 5 MG tablet, Take 5 mg by mouth daily., Disp:  , Rfl: 0  Allergies: No Known Allergies  Past Medical History, Surgical history, Social history, and Family History were reviewed and updated.  Review of Systems: Review of Systems  Constitutional: Negative.   HENT:  Negative.   Eyes: Negative.   Respiratory: Negative.   Cardiovascular: Negative.   Gastrointestinal: Negative.   Endocrine: Negative.   Genitourinary: Negative.    Musculoskeletal: Negative.   Skin: Negative.   Neurological: Negative.   Hematological: Negative.   Psychiatric/Behavioral: Negative.     Physical Exam:  weight is 247 lb (112 kg).   Wt Readings from Last 3 Encounters:  02/06/19 247 lb (112 kg)  01/23/19 245 lb (111.1 kg)  12/14/18 244 lb 1.9 oz (110.7 kg)    Physical Exam Vitals reviewed.  HENT:     Head: Normocephalic and atraumatic.  Eyes:     Pupils: Pupils are equal, round, and reactive to light.  Cardiovascular:     Rate and Rhythm: Normal rate and regular rhythm.     Heart sounds: Normal heart sounds.     Comments: Regular rate and rhythm with an occasional extra beat. Pulmonary:     Effort: Pulmonary effort is normal.     Breath sounds: Normal breath sounds.  Abdominal:     General: Bowel sounds are normal.     Palpations: Abdomen is soft.     Comments: Abdomen is soft.  He has multiple laparotomy scars.  There is no fluid wave.  There is no palpable liver or spleen tip.  Musculoskeletal:        General: No tenderness or deformity. Normal range of motion.     Cervical back: Normal range of motion.  Lymphadenopathy:     Cervical: No cervical adenopathy.  Skin:    General: Skin is warm and dry.     Findings: No erythema or rash.  Neurological:     Mental Status: He is alert and oriented to person, place, and time.  Psychiatric:        Behavior: Behavior normal.        Thought Content: Thought content normal.        Judgment: Judgment normal.      Lab Results  Component Value Date   WBC 2.5 (L) 02/06/2019   HGB 12.0  (L) 02/06/2019   HCT 37.0 (L) 02/06/2019   MCV 90.2 02/06/2019   PLT 99 (L) 02/06/2019     Chemistry      Component Value Date/Time   NA 142 02/06/2019 0909   K 3.9  02/06/2019 0909   CL 105 02/06/2019 0909   CO2 31 02/06/2019 0909   BUN 17 02/06/2019 0909   CREATININE 1.25 (H) 02/06/2019 0909      Component Value Date/Time   CALCIUM 8.6 (L) 02/06/2019 0909   ALKPHOS 186 (H) 02/06/2019 0909   AST 48 (H) 02/06/2019 0909   ALT 57 (H) 02/06/2019 0909   BILITOT 0.7 02/06/2019 0909       Impression and Plan: Zachary Willis is a 74 year old white male with metastatic colon cancer.  Hopefully, we will see that the CEA level is better.  His ocular phosphatase is a little bit better and sometimes this is a surrogate marker for response.  If he does not show response, we will have to get CT scan.  We may have to get a biopsy.  An option that we could use this is switch him over to FOLFOX.  We have not yet used Avastin or Erbitux.  This also might be a consideration.  This is quite complicated.  I spent about 35 minutes with him today.  I went over his lab work.  I explained what the choices are that we have depending on the CEA level.  He is quite eloquent.  He is very well educated.  He has a good understanding of the situation.    [name], MD 2/3/20219:44 AM

## 2019-02-06 NOTE — Progress Notes (Signed)
Labs CBC and CMET Reviewed with MD, ok to treat despite counts

## 2019-02-06 NOTE — Telephone Encounter (Addendum)
Pt notified by phone of the attached message. Verbalizes understanding and appreciation. dph  ----- Message from [name], MD sent at 02/06/2019  2:18 PM EST ----- Call - the CEA is down to 310!!!  The treatment is working!!  Zachary Willis

## 2019-02-08 ENCOUNTER — Other Ambulatory Visit: Payer: Self-pay

## 2019-02-08 ENCOUNTER — Inpatient Hospital Stay: Payer: Medicare HMO

## 2019-02-08 VITALS — BP 140/87 | HR 64 | Temp 98.5°F | Resp 17

## 2019-02-08 DIAGNOSIS — C189 Malignant neoplasm of colon, unspecified: Secondary | ICD-10-CM

## 2019-02-08 DIAGNOSIS — Z5111 Encounter for antineoplastic chemotherapy: Secondary | ICD-10-CM | POA: Diagnosis not present

## 2019-02-08 MED ORDER — HEPARIN SOD (PORK) LOCK FLUSH 100 UNIT/ML IV SOLN
500.0000 [IU] | Freq: Once | INTRAVENOUS | Status: AC | PRN
  Administered 2019-02-08: 500 [IU]
  Filled 2019-02-08: qty 5

## 2019-02-08 MED ORDER — SODIUM CHLORIDE 0.9% FLUSH
10.0000 mL | INTRAVENOUS | Status: DC | PRN
  Administered 2019-02-08: 10 mL
  Filled 2019-02-08: qty 10

## 2019-02-08 NOTE — Patient Instructions (Signed)

## 2019-02-20 ENCOUNTER — Inpatient Hospital Stay: Payer: Medicare HMO

## 2019-02-20 ENCOUNTER — Other Ambulatory Visit: Payer: Self-pay

## 2019-02-20 ENCOUNTER — Telehealth: Payer: Self-pay | Admitting: Hematology & Oncology

## 2019-02-20 ENCOUNTER — Encounter: Payer: Self-pay | Admitting: Hematology & Oncology

## 2019-02-20 ENCOUNTER — Inpatient Hospital Stay (HOSPITAL_BASED_OUTPATIENT_CLINIC_OR_DEPARTMENT_OTHER): Payer: Medicare HMO | Admitting: Hematology & Oncology

## 2019-02-20 VITALS — BP 118/73 | HR 63 | Temp 96.6°F | Resp 18 | Wt 248.0 lb

## 2019-02-20 DIAGNOSIS — C787 Secondary malignant neoplasm of liver and intrahepatic bile duct: Secondary | ICD-10-CM

## 2019-02-20 DIAGNOSIS — Z5111 Encounter for antineoplastic chemotherapy: Secondary | ICD-10-CM | POA: Diagnosis not present

## 2019-02-20 DIAGNOSIS — C189 Malignant neoplasm of colon, unspecified: Secondary | ICD-10-CM

## 2019-02-20 LAB — CBC WITH DIFFERENTIAL (CANCER CENTER ONLY)
Abs Immature Granulocytes: 0.04 10*3/uL (ref 0.00–0.07)
Basophils Absolute: 0 10*3/uL (ref 0.0–0.1)
Basophils Relative: 1 %
Eosinophils Absolute: 0.1 10*3/uL (ref 0.0–0.5)
Eosinophils Relative: 2 %
HCT: 34.9 % — ABNORMAL LOW (ref 39.0–52.0)
Hemoglobin: 11.3 g/dL — ABNORMAL LOW (ref 13.0–17.0)
Immature Granulocytes: 1 %
Lymphocytes Relative: 31 %
Lymphs Abs: 1 10*3/uL (ref 0.7–4.0)
MCH: 29.6 pg (ref 26.0–34.0)
MCHC: 32.4 g/dL (ref 30.0–36.0)
MCV: 91.4 fL (ref 80.0–100.0)
Monocytes Absolute: 0.3 10*3/uL (ref 0.1–1.0)
Monocytes Relative: 10 %
Neutro Abs: 1.7 10*3/uL (ref 1.7–7.7)
Neutrophils Relative %: 55 %
Platelet Count: 97 10*3/uL — ABNORMAL LOW (ref 150–400)
RBC: 3.82 MIL/uL — ABNORMAL LOW (ref 4.22–5.81)
RDW: 18.1 % — ABNORMAL HIGH (ref 11.5–15.5)
WBC Count: 3.1 10*3/uL — ABNORMAL LOW (ref 4.0–10.5)
nRBC: 2.6 % — ABNORMAL HIGH (ref 0.0–0.2)

## 2019-02-20 LAB — CMP (CANCER CENTER ONLY)
ALT: 38 U/L (ref 0–44)
AST: 34 U/L (ref 15–41)
Albumin: 3.4 g/dL — ABNORMAL LOW (ref 3.5–5.0)
Alkaline Phosphatase: 150 U/L — ABNORMAL HIGH (ref 38–126)
Anion gap: 7 (ref 5–15)
BUN: 26 mg/dL — ABNORMAL HIGH (ref 8–23)
CO2: 29 mmol/L (ref 22–32)
Calcium: 8.8 mg/dL — ABNORMAL LOW (ref 8.9–10.3)
Chloride: 105 mmol/L (ref 98–111)
Creatinine: 1.29 mg/dL — ABNORMAL HIGH (ref 0.61–1.24)
GFR, Est AFR Am: >60 mL/min (ref >60)
GFR, Estimated: 55 mL/min — ABNORMAL LOW (ref >60)
Glucose, Bld: 154 mg/dL — ABNORMAL HIGH (ref 70–99)
Potassium: 3.7 mmol/L (ref 3.5–5.1)
Sodium: 141 mmol/L (ref 135–145)
Total Bilirubin: 0.9 mg/dL (ref 0.3–1.2)
Total Protein: 5.2 g/dL — ABNORMAL LOW (ref 6.5–8.1)

## 2019-02-20 LAB — LACTATE DEHYDROGENASE: LDH: 343 U/L — ABNORMAL HIGH (ref 98–192)

## 2019-02-20 LAB — CEA (IN HOUSE-CHCC): CEA (CHCC-In House): 243.99 ng/mL — ABNORMAL HIGH (ref 0.00–5.00)

## 2019-02-20 MED ORDER — HEPARIN SOD (PORK) LOCK FLUSH 100 UNIT/ML IV SOLN
500.0000 [IU] | Freq: Once | INTRAVENOUS | Status: DC | PRN
  Filled 2019-02-20: qty 5

## 2019-02-20 MED ORDER — SODIUM CHLORIDE 0.9% FLUSH
10.0000 mL | INTRAVENOUS | Status: DC | PRN
  Filled 2019-02-20: qty 10

## 2019-02-20 MED ORDER — SODIUM CHLORIDE 0.9 % IV SOLN
Freq: Once | INTRAVENOUS | Status: AC
  Filled 2019-02-20: qty 250

## 2019-02-20 MED ORDER — PALONOSETRON HCL INJECTION 0.25 MG/5ML
INTRAVENOUS | Status: AC
  Filled 2019-02-20: qty 5

## 2019-02-20 MED ORDER — ATROPINE SULFATE 1 MG/ML IJ SOLN
0.5000 mg | Freq: Once | INTRAMUSCULAR | Status: AC | PRN
  Administered 2019-02-20: 0.5 mg via INTRAVENOUS

## 2019-02-20 MED ORDER — PALONOSETRON HCL INJECTION 0.25 MG/5ML
0.2500 mg | Freq: Once | INTRAVENOUS | Status: AC
  Administered 2019-02-20: 0.25 mg via INTRAVENOUS

## 2019-02-20 MED ORDER — FLUOROURACIL CHEMO INJECTION 2.5 GM/50ML
400.0000 mg/m2 | Freq: Once | INTRAVENOUS | Status: AC
  Administered 2019-02-20: 950 mg via INTRAVENOUS
  Filled 2019-02-20: qty 19

## 2019-02-20 MED ORDER — ATROPINE SULFATE 1 MG/ML IJ SOLN
INTRAMUSCULAR | Status: AC
  Filled 2019-02-20: qty 1

## 2019-02-20 MED ORDER — SODIUM CHLORIDE 0.9 % IV SOLN
400.0000 mg/m2 | Freq: Once | INTRAVENOUS | Status: AC
  Administered 2019-02-20: 936 mg via INTRAVENOUS
  Filled 2019-02-20: qty 46.8

## 2019-02-20 MED ORDER — SODIUM CHLORIDE 0.9 % IV SOLN
2140.0000 mg/m2 | INTRAVENOUS | Status: DC
  Administered 2019-02-20: 5000 mg via INTRAVENOUS
  Filled 2019-02-20: qty 100

## 2019-02-20 MED ORDER — DEXAMETHASONE SODIUM PHOSPHATE 10 MG/ML IJ SOLN
INTRAMUSCULAR | Status: AC
  Filled 2019-02-20: qty 1

## 2019-02-20 MED ORDER — DEXAMETHASONE SODIUM PHOSPHATE 10 MG/ML IJ SOLN
10.0000 mg | Freq: Once | INTRAMUSCULAR | Status: AC
  Administered 2019-02-20: 10 mg via INTRAVENOUS

## 2019-02-20 MED ORDER — SODIUM CHLORIDE 0.9 % IV SOLN
144.0000 mg/m2 | Freq: Once | INTRAVENOUS | Status: AC
  Administered 2019-02-20: 340 mg via INTRAVENOUS
  Filled 2019-02-20: qty 15

## 2019-02-20 NOTE — Telephone Encounter (Signed)
Appointments scheduled calendar printed per 2/17 los 

## 2019-02-20 NOTE — Patient Instructions (Signed)

## 2019-02-20 NOTE — Progress Notes (Signed)
Ok to treat with platelets of 97 per Dr Ennever. dph °

## 2019-02-20 NOTE — Progress Notes (Signed)
Hematology and Oncology Follow Up Visit  XZAYVIAN MACNAUGHTON [id number] [birth year] 74 y.o. 02/20/2019   Principle Diagnosis:  Metastatic colon cancer - progressive  Current Therapy:   Status post RFA to the liver-11/21/2017  S/P Y-90 x 2 -- last therapy on 08/27/2018 FOLFIRI --  S/p cycle #3-- start on 01/09/2019     Interim History:  Mr. Alessio is back for follow-up.  He feels well.  He really has had no complaints.  He has had no problems with nausea or vomiting.  He has had no cough or shortness of breath.  He is on Coumadin.  His cardiac issues seem to be doing pretty well right now.  His last CEA thankfully was down to 310.  Hopefully, we will see a continued downward trend for the CEA.  He is had no change in bowel or bladder habits.  He has had no leg swelling.  He has had no rashes.  He says that his mouth does get little bit sore.  He rinses with water and salt.  Overall, his performance status is ECOG .  Medications:  Current Outpatient Medications:  .  allopurinol (ZYLOPRIM) 300 MG tablet, Take 300 mg by mouth every morning. , Disp: , Rfl:  .  atorvastatin (LIPITOR) 40 MG tablet, Take 40 mg by mouth daily., Disp: , Rfl:  .  carvedilol (COREG) 6.25 MG tablet, TAKE 1 TABLET (6.25 MG TOTAL) BY MOUTH 2 TIMES DAILY WITH MEALS., Disp: , Rfl:  .  [facility] ASPIRIN ADULT LOW DOSE 81 MG chewable tablet, Chew 81 mg by mouth daily., Disp: , Rfl:  .  [facility] STOOL SOFTENER/LAXATIVE 8.6-50 MG tablet, TAKE 2 TABLETS EVERY EVENING, Disp: , Rfl:  .  dexamethasone (DECADRON) 4 MG tablet, Take 2 tablets (8 mg total) by mouth daily. Start the day after chemo for 2 days., Disp: 8 tablet, Rfl: 5 .  ferrous sulfate 325 (65 FE) MG tablet, Take 325 mg by mouth daily., Disp: , Rfl:  .  furosemide (LASIX) 20 MG tablet, Take 20 mg by mouth daily. , Disp: , Rfl:  .  hydrocortisone (CORTEF) 10 MG tablet, Take 5-15 mg by mouth See admin instructions. Takes 15 mg in am and 5mg  in afternoon., Disp: , Rfl: 4 .   levothyroxine (SYNTHROID, LEVOTHROID) 88 MCG tablet, Take 88 mcg by mouth daily before breakfast. , Disp: , Rfl:  .  lidocaine-prilocaine (EMLA) cream, Apply to affected area once, Disp: 30 g, Rfl: 3 .  loperamide (IMODIUM A-D) 2 MG tablet, Take 1 tablet (2 mg total) by mouth 4 (four) times daily as needed. Take 2 at diarrhea onset , then 1 every 2hr until 12hrs with no BM. May take 2 every 4hrs at night. If diarrhea recurs repeat., Disp: 100 tablet, Rfl: 1 .  loperamide (IMODIUM) 2 MG capsule, Take 2 mg by mouth as needed for diarrhea or loose stools. , Disp: , Rfl:  .  LORazepam (ATIVAN) 1 MG tablet, Take 1 tablet (1 mg total) by mouth every 6 (six) hours as needed (NAUSEA)., Disp: 30 tablet, Rfl: 0 .  NASAL WASH NA, Place 1 spray into the nose daily. , Disp: , Rfl:  .  ondansetron (ZOFRAN) 8 MG tablet, Take 1 tablet (8 mg total) by mouth 2 (two) times daily as needed for refractory nausea / vomiting. Start on day 3 after chemotherapy., Disp: 30 tablet, Rfl: 1 .  pantoprazole (PROTONIX) 40 MG tablet, Take 40 mg by mouth every morning. , Disp: , Rfl:  .  polyvinyl alcohol (LIQUIFILM TEARS) 1.4 % ophthalmic solution, Place 1 drop into both eyes daily as needed for dry eyes. , Disp: , Rfl:  .  potassium chloride (KLOR-CON 10) 10 MEQ tablet, Take 20 mEq by mouth daily., Disp: , Rfl:  .  prochlorperazine (COMPAZINE) 10 MG tablet, Take 10 mg by mouth every 8 (eight) hours as needed for nausea or vomiting. , Disp: , Rfl:  .  prochlorperazine (COMPAZINE) 10 MG tablet, Take 1 tablet (10 mg total) by mouth every 6 (six) hours as needed (NAUSEA)., Disp: 30 tablet, Rfl: 1 .  testosterone (ANDRODERM) 4 MG/24HR PT24 patch, Place 1 patch onto the skin daily. , Disp: , Rfl:  .  warfarin (COUMADIN) 5 MG tablet, Take 5 mg by mouth daily., Disp: , Rfl: 0  Allergies: No Known Allergies  Past Medical History, Surgical history, Social history, and Family History were reviewed and updated.  Review of Systems: Review  of Systems  Constitutional: Negative.   HENT:  Negative.   Eyes: Negative.   Respiratory: Negative.   Cardiovascular: Negative.   Gastrointestinal: Negative.   Endocrine: Negative.   Genitourinary: Negative.    Musculoskeletal: Negative.   Skin: Negative.   Neurological: Negative.   Hematological: Negative.   Psychiatric/Behavioral: Negative.     Physical Exam:  weight is 248 lb (112.5 kg). His temporal temperature is 96.6 F (35.9 C) (abnormal). His blood pressure is 118/73 and his pulse is 63. His respiration is 18 and oxygen saturation is 100%.   Wt Readings from Last 3 Encounters:  02/20/19 248 lb (112.5 kg)  02/06/19 247 lb (112 kg)  01/23/19 245 lb (111.1 kg)    Physical Exam Vitals reviewed.  HENT:     Head: Normocephalic and atraumatic.  Eyes:     Pupils: Pupils are equal, round, and reactive to light.  Cardiovascular:     Rate and Rhythm: Normal rate and regular rhythm.     Heart sounds: Normal heart sounds.     Comments: Regular rate and rhythm with an occasional extra beat. Pulmonary:     Effort: Pulmonary effort is normal.     Breath sounds: Normal breath sounds.  Abdominal:     General: Bowel sounds are normal.     Palpations: Abdomen is soft.     Comments: Abdomen is soft.  He has multiple laparotomy scars.  There is no fluid wave.  There is no palpable liver or spleen tip.  Musculoskeletal:        General: No tenderness or deformity. Normal range of motion.     Cervical back: Normal range of motion.  Lymphadenopathy:     Cervical: No cervical adenopathy.  Skin:    General: Skin is warm and dry.     Findings: No erythema or rash.  Neurological:     Mental Status: He is alert and oriented to person, place, and time.  Psychiatric:        Behavior: Behavior normal.        Thought Content: Thought content normal.        Judgment: Judgment normal.      Lab Results  Component Value Date   WBC 3.1 (L) 02/20/2019   HGB 11.3 (L) 02/20/2019   HCT  34.9 (L) 02/20/2019   MCV 91.4 02/20/2019   PLT 97 (L) 02/20/2019     Chemistry      Component Value Date/Time   NA 141 02/20/2019 0832   K 3.7 02/20/2019 0832   CL 105 02/20/2019  0832   CO2 29 02/20/2019 0832   BUN 26 (H) 02/20/2019 0832   CREATININE 1.29 (H) 02/20/2019 0832      Component Value Date/Time   CALCIUM 8.8 (L) 02/20/2019 0832   ALKPHOS 150 (H) 02/20/2019 0832   AST 34 02/20/2019 0832   ALT 38 02/20/2019 0832   BILITOT 0.9 02/20/2019 0832       Impression and Plan: Mr. Antunez is a 74 year old white male with metastatic colon cancer.  Hopefully, we will see that the CEA level is better.  His Alkaline Phosphatase is a little bit better and sometimes this is a surrogate marker for response.  We will go ahead with his fourth cycle of treatment.  After this treatment, we will then set him up with a PET scan.  I will probably give him 3 weeks in between cycles this time.  I think it would help his blood counts.      [name], MD 2/17/20219:39 AM

## 2019-02-20 NOTE — Patient Instructions (Signed)
Fluorouracil, 5-FU injection What is this medicine? FLUOROURACIL, 5-FU (flure oh YOOR a sil) is a chemotherapy drug. It slows the growth of cancer cells. This medicine is used to treat many types of cancer like breast cancer, colon or rectal cancer, pancreatic cancer, and stomach cancer. This medicine may be used for other purposes; ask your health care provider or pharmacist if you have questions. COMMON BRAND NAME(S): Adrucil What should I tell my health care provider before I take this medicine? They need to know if you have any of these conditions:  blood disorders  dihydropyrimidine dehydrogenase (DPD) deficiency  infection (especially a virus infection such as chickenpox, cold sores, or herpes)  kidney disease  liver disease  malnourished, poor nutrition  recent or ongoing radiation therapy  an unusual or allergic reaction to fluorouracil, other chemotherapy, other medicines, foods, dyes, or preservatives  pregnant or trying to get pregnant  breast-feeding How should I use this medicine? This drug is given as an infusion or injection into a vein. It is administered in a hospital or clinic by a specially trained health care professional. Talk to your pediatrician regarding the use of this medicine in children. Special care may be needed. Overdosage: If you think you have taken too much of this medicine contact a poison control center or emergency room at once. NOTE: This medicine is only for you. Do not share this medicine with others. What if I miss a dose? It is important not to miss your dose. Call your doctor or health care professional if you are unable to keep an appointment. What may interact with this medicine?  allopurinol  cimetidine  dapsone  digoxin  hydroxyurea  leucovorin  levamisole  medicines for seizures like ethotoin, fosphenytoin, phenytoin  medicines to increase blood counts like filgrastim, pegfilgrastim, sargramostim  medicines that  treat or prevent blood clots like warfarin, enoxaparin, and dalteparin  methotrexate  metronidazole  pyrimethamine  some other chemotherapy drugs like busulfan, cisplatin, estramustine, vinblastine  trimethoprim  trimetrexate  vaccines Talk to your doctor or health care professional before taking any of these medicines:  acetaminophen  aspirin  ibuprofen  ketoprofen  naproxen This list may not describe all possible interactions. Give your health care provider a list of all the medicines, herbs, non-prescription drugs, or dietary supplements you use. Also tell them if you smoke, drink alcohol, or use illegal drugs. Some items may interact with your medicine. What should I watch for while using this medicine? Visit your doctor for checks on your progress. This drug may make you feel generally unwell. This is not uncommon, as chemotherapy can affect healthy cells as well as cancer cells. Report any side effects. Continue your course of treatment even though you feel ill unless your doctor tells you to stop. In some cases, you may be given additional medicines to help with side effects. Follow all directions for their use. Call your doctor or health care professional for advice if you get a fever, chills or sore throat, or other symptoms of a cold or flu. Do not treat yourself. This drug decreases your body's ability to fight infections. Try to avoid being around people who are sick. This medicine may increase your risk to bruise or bleed. Call your doctor or health care professional if you notice any unusual bleeding. Be careful brushing and flossing your teeth or using a toothpick because you may get an infection or bleed more easily. If you have any dental work done, tell your dentist you are  receiving this medicine. Avoid taking products that contain aspirin, acetaminophen, ibuprofen, naproxen, or ketoprofen unless instructed by your doctor. These medicines may hide a fever. Do not  become pregnant while taking this medicine. Women should inform their doctor if they wish to become pregnant or think they might be pregnant. There is a potential for serious side effects to an unborn child. Talk to your health care professional or pharmacist for more information. Do not breast-feed an infant while taking this medicine. Men should inform their doctor if they wish to father a child. This medicine may lower sperm counts. Do not treat diarrhea with over the counter products. Contact your doctor if you have diarrhea that lasts more than 2 days or if it is severe and watery. This medicine can make you more sensitive to the sun. Keep out of the sun. If you cannot avoid being in the sun, wear protective clothing and use sunscreen. Do not use sun lamps or tanning beds/booths. What side effects may I notice from receiving this medicine? Side effects that you should report to your doctor or health care professional as soon as possible:  allergic reactions like skin rash, itching or hives, swelling of the face, lips, or tongue  low blood counts - this medicine may decrease the number of white blood cells, red blood cells and platelets. You may be at increased risk for infections and bleeding.  signs of infection - fever or chills, cough, sore throat, pain or difficulty passing urine  signs of decreased platelets or bleeding - bruising, pinpoint red spots on the skin, black, tarry stools, blood in the urine  signs of decreased red blood cells - unusually weak or tired, fainting spells, lightheadedness  breathing problems  changes in vision  chest pain  mouth sores  nausea and vomiting  pain, swelling, redness at site where injected  pain, tingling, numbness in the hands or feet  redness, swelling, or sores on hands or feet  stomach pain  unusual bleeding Side effects that usually do not require medical attention (report to your doctor or health care professional if they  continue or are bothersome):  changes in finger or toe nails  diarrhea  dry or itchy skin  hair loss  headache  loss of appetite  sensitivity of eyes to the light  stomach upset  unusually teary eyes This list may not describe all possible side effects. Call your doctor for medical advice about side effects. You may report side effects to FDA at 1-800-FDA-1088. Where should I keep my medicine? This drug is given in a hospital or clinic and will not be stored at home. NOTE: This sheet is a summary. It may not cover all possible information. If you have questions about this medicine, talk to your doctor, pharmacist, or health care provider.  2020 Elsevier/Gold Standard (2007-04-25 13:53:16) Leucovorin injection What is this medicine? LEUCOVORIN (loo koe VOR in) is used to prevent or treat the harmful effects of some medicines. This medicine is used to treat anemia caused by a low amount of folic acid in the body. It is also used with 5-fluorouracil (5-FU) to treat colon cancer. This medicine may be used for other purposes; ask your health care provider or pharmacist if you have questions. What should I tell my health care provider before I take this medicine? They need to know if you have any of these conditions:  anemia from low levels of vitamin B-12 in the blood  an unusual or allergic reaction to  leucovorin, folic acid, other medicines, foods, dyes, or preservatives  pregnant or trying to get pregnant  breast-feeding How should I use this medicine? This medicine is for injection into a muscle or into a vein. It is given by a health care professional in a hospital or clinic setting. Talk to your pediatrician regarding the use of this medicine in children. Special care may be needed. Overdosage: If you think you have taken too much of this medicine contact a poison control center or emergency room at once. NOTE: This medicine is only for you. Do not share this medicine with  others. What if I miss a dose? This does not apply. What may interact with this medicine?  capecitabine  fluorouracil  phenobarbital  phenytoin  primidone  trimethoprim-sulfamethoxazole This list may not describe all possible interactions. Give your health care provider a list of all the medicines, herbs, non-prescription drugs, or dietary supplements you use. Also tell them if you smoke, drink alcohol, or use illegal drugs. Some items may interact with your medicine. What should I watch for while using this medicine? Your condition will be monitored carefully while you are receiving this medicine. This medicine may increase the side effects of 5-fluorouracil, 5-FU. Tell your doctor or health care professional if you have diarrhea or mouth sores that do not get better or that get worse. What side effects may I notice from receiving this medicine? Side effects that you should report to your doctor or health care professional as soon as possible:  allergic reactions like skin rash, itching or hives, swelling of the face, lips, or tongue  breathing problems  fever, infection  mouth sores  unusual bleeding or bruising  unusually weak or tired Side effects that usually do not require medical attention (report to your doctor or health care professional if they continue or are bothersome):  constipation or diarrhea  loss of appetite  nausea, vomiting This list may not describe all possible side effects. Call your doctor for medical advice about side effects. You may report side effects to FDA at 1-800-FDA-1088. Where should I keep my medicine? This drug is given in a hospital or clinic and will not be stored at home. NOTE: This sheet is a summary. It may not cover all possible information. If you have questions about this medicine, talk to your doctor, pharmacist, or health care provider.  2020 Elsevier/Gold Standard (2007-06-26 16:50:29) Irinotecan injection What is this  medicine? IRINOTECAN (ir in oh TEE kan ) is a chemotherapy drug. It is used to treat colon and rectal cancer. This medicine may be used for other purposes; ask your health care provider or pharmacist if you have questions. COMMON BRAND NAME(S): Camptosar What should I tell my health care provider before I take this medicine? They need to know if you have any of these conditions:  dehydration  diarrhea  infection (especially a virus infection such as chickenpox, cold sores, or herpes)  liver disease  low blood counts, like low white cell, platelet, or red cell counts  low levels of calcium, magnesium, or potassium in the blood  recent or ongoing radiation therapy  an unusual or allergic reaction to irinotecan, other medicines, foods, dyes, or preservatives  pregnant or trying to get pregnant  breast-feeding How should I use this medicine? This drug is given as an infusion into a vein. It is administered in a hospital or clinic by a specially trained health care professional. Talk to your pediatrician regarding the use of this medicine   in children. Special care may be needed. Overdosage: If you think you have taken too much of this medicine contact a poison control center or emergency room at once. NOTE: This medicine is only for you. Do not share this medicine with others. What if I miss a dose? It is important not to miss your dose. Call your doctor or health care professional if you are unable to keep an appointment. What may interact with this medicine? This medicine may interact with the following medications:  antiviral medicines for HIV or AIDS  certain antibiotics like rifampin or rifabutin  certain medicines for fungal infections like itraconazole, ketoconazole, posaconazole, and voriconazole  certain medicines for seizures like carbamazepine, phenobarbital, phenotoin  clarithromycin  gemfibrozil  nefazodone  St. John's Wort This list may not describe all  possible interactions. Give your health care provider a list of all the medicines, herbs, non-prescription drugs, or dietary supplements you use. Also tell them if you smoke, drink alcohol, or use illegal drugs. Some items may interact with your medicine. What should I watch for while using this medicine? Your condition will be monitored carefully while you are receiving this medicine. You will need important blood work done while you are taking this medicine. This drug may make you feel generally unwell. This is not uncommon, as chemotherapy can affect healthy cells as well as cancer cells. Report any side effects. Continue your course of treatment even though you feel ill unless your doctor tells you to stop. In some cases, you may be given additional medicines to help with side effects. Follow all directions for their use. You may get drowsy or dizzy. Do not drive, use machinery, or do anything that needs mental alertness until you know how this medicine affects you. Do not stand or sit up quickly, especially if you are an older patient. This reduces the risk of dizzy or fainting spells. Call your health care professional for advice if you get a fever, chills, or sore throat, or other symptoms of a cold or flu. Do not treat yourself. This medicine decreases your body's ability to fight infections. Try to avoid being around people who are sick. Avoid taking products that contain aspirin, acetaminophen, ibuprofen, naproxen, or ketoprofen unless instructed by your doctor. These medicines may hide a fever. This medicine may increase your risk to bruise or bleed. Call your doctor or health care professional if you notice any unusual bleeding. Be careful brushing and flossing your teeth or using a toothpick because you may get an infection or bleed more easily. If you have any dental work done, tell your dentist you are receiving this medicine. Do not become pregnant while taking this medicine or for 6 months  after stopping it. Women should inform their health care professional if they wish to become pregnant or think they might be pregnant. Men should not father a child while taking this medicine and for 3 months after stopping it. There is potential for serious side effects to an unborn child. Talk to your health care professional for more information. Do not breast-feed an infant while taking this medicine or for 7 days after stopping it. This medicine has caused ovarian failure in some women. This medicine may make it more difficult to get pregnant. Talk to your health care professional if you are concerned about your fertility. This medicine has caused decreased sperm counts in some men. This may make it more difficult to father a child. Talk to your health care professional if you   are concerned about your fertility. What side effects may I notice from receiving this medicine? Side effects that you should report to your doctor or health care professional as soon as possible:  allergic reactions like skin rash, itching or hives, swelling of the face, lips, or tongue  chest pain  diarrhea  flushing, runny nose, sweating during infusion  low blood counts - this medicine may decrease the number of white blood cells, red blood cells and platelets. You may be at increased risk for infections and bleeding.  nausea, vomiting  pain, swelling, warmth in the leg  signs of decreased platelets or bleeding - bruising, pinpoint red spots on the skin, black, tarry stools, blood in the urine  signs of infection - fever or chills, cough, sore throat, pain or difficulty passing urine  signs of decreased red blood cells - unusually weak or tired, fainting spells, lightheadedness Side effects that usually do not require medical attention (report to your doctor or health care professional if they continue or are bothersome):  constipation  hair loss  headache  loss of appetite  mouth sores  stomach  pain This list may not describe all possible side effects. Call your doctor for medical advice about side effects. You may report side effects to FDA at 1-800-FDA-1088. Where should I keep my medicine? This drug is given in a hospital or clinic and will not be stored at home. NOTE: This sheet is a summary. It may not cover all possible information. If you have questions about this medicine, talk to your doctor, pharmacist, or health care provider.  2020 Elsevier/Gold Standard (2018-02-09 10:09:17)  

## 2019-02-21 ENCOUNTER — Encounter: Payer: Self-pay | Admitting: *Deleted

## 2019-02-22 ENCOUNTER — Inpatient Hospital Stay: Payer: Medicare HMO

## 2019-02-22 ENCOUNTER — Other Ambulatory Visit: Payer: Self-pay

## 2019-02-22 VITALS — BP 127/73 | HR 61 | Temp 96.9°F

## 2019-02-22 DIAGNOSIS — C787 Secondary malignant neoplasm of liver and intrahepatic bile duct: Secondary | ICD-10-CM

## 2019-02-22 DIAGNOSIS — Z5111 Encounter for antineoplastic chemotherapy: Secondary | ICD-10-CM | POA: Diagnosis not present

## 2019-02-22 DIAGNOSIS — C189 Malignant neoplasm of colon, unspecified: Secondary | ICD-10-CM

## 2019-02-22 MED ORDER — HEPARIN SOD (PORK) LOCK FLUSH 100 UNIT/ML IV SOLN
500.0000 [IU] | Freq: Once | INTRAVENOUS | Status: AC | PRN
  Administered 2019-02-22: 500 [IU]
  Filled 2019-02-22: qty 5

## 2019-02-22 MED ORDER — SODIUM CHLORIDE 0.9% FLUSH
10.0000 mL | INTRAVENOUS | Status: DC | PRN
  Administered 2019-02-22: 10 mL
  Filled 2019-02-22: qty 10

## 2019-02-22 NOTE — Patient Instructions (Signed)
Pump removed.  Patient instructed to call if he has any problems such as not eating or drinking.  Return to office as scheduled.  Patient has a printed schedule.

## 2019-03-01 ENCOUNTER — Encounter: Payer: Self-pay | Admitting: Family

## 2019-03-06 ENCOUNTER — Telehealth: Payer: Self-pay

## 2019-03-06 ENCOUNTER — Ambulatory Visit (HOSPITAL_COMMUNITY): Payer: Medicare HMO

## 2019-03-06 NOTE — Telephone Encounter (Signed)
Received call from pt's spouse reporting pt was dx with shingles this am at PCP office & started on Valtrex. Questions if Dr Marin Olp is ok with pt taking this medication & if chemo for next Monday 3/8 should be rescheduled.   Per Dr Marin Olp, ok to take Valtrex. Lab/MD/chemo scheduled for 3/8 to be pushed out x 1 week.   [name] notified of the above. Also questions if pt should r/s his second Covid vaccine at [place]. Advised [name] to contact [facility] regarding if vaccine should be r/s. Verbalizes understanding. dph

## 2019-03-11 ENCOUNTER — Ambulatory Visit: Payer: Medicare HMO

## 2019-03-11 ENCOUNTER — Other Ambulatory Visit: Payer: Medicare HMO

## 2019-03-11 ENCOUNTER — Ambulatory Visit: Payer: Medicare HMO | Admitting: Hematology & Oncology

## 2019-03-12 ENCOUNTER — Other Ambulatory Visit: Payer: Medicare HMO

## 2019-03-12 ENCOUNTER — Ambulatory Visit: Payer: Medicare HMO | Admitting: Hematology & Oncology

## 2019-03-12 ENCOUNTER — Ambulatory Visit: Payer: Medicare HMO

## 2019-03-19 ENCOUNTER — Encounter (HOSPITAL_COMMUNITY)
Admission: RE | Admit: 2019-03-19 | Discharge: 2019-03-19 | Disposition: A | Discharge status: Home / Self Care | Payer: Medicare HMO | Source: Ambulatory Visit | Attending: Hematology & Oncology | Admitting: Hematology & Oncology

## 2019-03-19 ENCOUNTER — Other Ambulatory Visit: Payer: Self-pay

## 2019-03-19 DIAGNOSIS — E041 Nontoxic single thyroid nodule: Secondary | ICD-10-CM | POA: Insufficient documentation

## 2019-03-19 DIAGNOSIS — C787 Secondary malignant neoplasm of liver and intrahepatic bile duct: Secondary | ICD-10-CM | POA: Diagnosis present

## 2019-03-19 DIAGNOSIS — I517 Cardiomegaly: Secondary | ICD-10-CM | POA: Diagnosis not present

## 2019-03-19 DIAGNOSIS — J328 Other chronic sinusitis: Secondary | ICD-10-CM | POA: Diagnosis not present

## 2019-03-19 DIAGNOSIS — I2510 Atherosclerotic heart disease of native coronary artery without angina pectoris: Secondary | ICD-10-CM | POA: Diagnosis not present

## 2019-03-19 DIAGNOSIS — R918 Other nonspecific abnormal finding of lung field: Secondary | ICD-10-CM | POA: Insufficient documentation

## 2019-03-19 DIAGNOSIS — I700 Atherosclerosis of aorta: Secondary | ICD-10-CM | POA: Insufficient documentation

## 2019-03-19 DIAGNOSIS — R161 Splenomegaly, not elsewhere classified: Secondary | ICD-10-CM | POA: Diagnosis not present

## 2019-03-19 DIAGNOSIS — Z9049 Acquired absence of other specified parts of digestive tract: Secondary | ICD-10-CM | POA: Diagnosis not present

## 2019-03-19 DIAGNOSIS — C189 Malignant neoplasm of colon, unspecified: Secondary | ICD-10-CM | POA: Insufficient documentation

## 2019-03-19 LAB — GLUCOSE, CAPILLARY: Glucose-Capillary: 135 mg/dL — ABNORMAL HIGH (ref 70–99)

## 2019-03-19 MED ORDER — FLUDEOXYGLUCOSE F - 18 (FDG) INJECTION
12.3000 | Freq: Once | INTRAVENOUS | Status: AC | PRN
  Administered 2019-03-19: 12.3 via INTRAVENOUS

## 2019-03-20 ENCOUNTER — Telehealth: Payer: Self-pay | Admitting: Hematology & Oncology

## 2019-03-20 ENCOUNTER — Other Ambulatory Visit: Payer: Self-pay

## 2019-03-20 ENCOUNTER — Telehealth: Payer: Self-pay | Admitting: *Deleted

## 2019-03-20 ENCOUNTER — Inpatient Hospital Stay: Payer: Medicare HMO

## 2019-03-20 ENCOUNTER — Inpatient Hospital Stay: Payer: Medicare HMO | Attending: Hematology & Oncology

## 2019-03-20 ENCOUNTER — Encounter: Payer: Self-pay | Admitting: Hematology & Oncology

## 2019-03-20 ENCOUNTER — Inpatient Hospital Stay (HOSPITAL_BASED_OUTPATIENT_CLINIC_OR_DEPARTMENT_OTHER): Payer: Medicare HMO | Admitting: Hematology & Oncology

## 2019-03-20 VITALS — BP 116/78 | HR 57 | Temp 96.0°F | Resp 17 | Wt 252.0 lb

## 2019-03-20 DIAGNOSIS — C787 Secondary malignant neoplasm of liver and intrahepatic bile duct: Secondary | ICD-10-CM

## 2019-03-20 DIAGNOSIS — Z5111 Encounter for antineoplastic chemotherapy: Secondary | ICD-10-CM | POA: Diagnosis present

## 2019-03-20 DIAGNOSIS — C189 Malignant neoplasm of colon, unspecified: Secondary | ICD-10-CM

## 2019-03-20 DIAGNOSIS — B029 Zoster without complications: Secondary | ICD-10-CM | POA: Diagnosis not present

## 2019-03-20 DIAGNOSIS — Z79899 Other long term (current) drug therapy: Secondary | ICD-10-CM | POA: Diagnosis not present

## 2019-03-20 DIAGNOSIS — R918 Other nonspecific abnormal finding of lung field: Secondary | ICD-10-CM | POA: Diagnosis not present

## 2019-03-20 DIAGNOSIS — C799 Secondary malignant neoplasm of unspecified site: Secondary | ICD-10-CM | POA: Diagnosis not present

## 2019-03-20 LAB — CBC WITH DIFFERENTIAL (CANCER CENTER ONLY)
Abs Immature Granulocytes: 0.29 10*3/uL — ABNORMAL HIGH (ref 0.00–0.07)
Basophils Absolute: 0.1 10*3/uL (ref 0.0–0.1)
Basophils Relative: 1 %
Eosinophils Absolute: 0.1 10*3/uL (ref 0.0–0.5)
Eosinophils Relative: 1 %
HCT: 31.9 % — ABNORMAL LOW (ref 39.0–52.0)
Hemoglobin: 10.1 g/dL — ABNORMAL LOW (ref 13.0–17.0)
Immature Granulocytes: 5 %
Lymphocytes Relative: 16 %
Lymphs Abs: 1.1 10*3/uL (ref 0.7–4.0)
MCH: 30.9 pg (ref 26.0–34.0)
MCHC: 31.7 g/dL (ref 30.0–36.0)
MCV: 97.6 fL (ref 80.0–100.0)
Monocytes Absolute: 0.5 10*3/uL (ref 0.1–1.0)
Monocytes Relative: 8 %
Neutro Abs: 4.5 10*3/uL (ref 1.7–7.7)
Neutrophils Relative %: 69 %
Platelet Count: 115 10*3/uL — ABNORMAL LOW (ref 150–400)
RBC: 3.27 MIL/uL — ABNORMAL LOW (ref 4.22–5.81)
RDW: 20.5 % — ABNORMAL HIGH (ref 11.5–15.5)
WBC Count: 6.5 10*3/uL (ref 4.0–10.5)
nRBC: 1.2 % — ABNORMAL HIGH (ref 0.0–0.2)

## 2019-03-20 LAB — CMP (CANCER CENTER ONLY)
ALT: 26 U/L (ref 0–44)
AST: 30 U/L (ref 15–41)
Albumin: 3.3 g/dL — ABNORMAL LOW (ref 3.5–5.0)
Alkaline Phosphatase: 163 U/L — ABNORMAL HIGH (ref 38–126)
Anion gap: 6 (ref 5–15)
BUN: 17 mg/dL (ref 8–23)
CO2: 30 mmol/L (ref 22–32)
Calcium: 8.7 mg/dL — ABNORMAL LOW (ref 8.9–10.3)
Chloride: 104 mmol/L (ref 98–111)
Creatinine: 1.09 mg/dL (ref 0.61–1.24)
GFR, Est AFR Am: >60 mL/min (ref >60)
GFR, Estimated: >60 mL/min (ref >60)
Glucose, Bld: 155 mg/dL — ABNORMAL HIGH (ref 70–99)
Potassium: 3.6 mmol/L (ref 3.5–5.1)
Sodium: 140 mmol/L (ref 135–145)
Total Bilirubin: 0.8 mg/dL (ref 0.3–1.2)
Total Protein: 5.7 g/dL — ABNORMAL LOW (ref 6.5–8.1)

## 2019-03-20 LAB — CEA (IN HOUSE-CHCC): CEA (CHCC-In House): 113.58 ng/mL — ABNORMAL HIGH (ref 0.00–5.00)

## 2019-03-20 MED ORDER — SODIUM CHLORIDE 0.9 % IV SOLN
144.0000 mg/m2 | Freq: Once | INTRAVENOUS | Status: AC
  Administered 2019-03-20: 340 mg via INTRAVENOUS
  Filled 2019-03-20: qty 15

## 2019-03-20 MED ORDER — FLUOROURACIL CHEMO INJECTION 2.5 GM/50ML
400.0000 mg/m2 | Freq: Once | INTRAVENOUS | Status: AC
  Administered 2019-03-20: 950 mg via INTRAVENOUS
  Filled 2019-03-20: qty 19

## 2019-03-20 MED ORDER — ATROPINE SULFATE 1 MG/ML IJ SOLN
INTRAMUSCULAR | Status: AC
  Filled 2019-03-20: qty 1

## 2019-03-20 MED ORDER — DEXAMETHASONE SODIUM PHOSPHATE 10 MG/ML IJ SOLN
10.0000 mg | Freq: Once | INTRAMUSCULAR | Status: AC
  Administered 2019-03-20: 10 mg via INTRAVENOUS

## 2019-03-20 MED ORDER — ACYCLOVIR 400 MG PO TABS: 400.0000 mg | ORAL_TABLET | Freq: Two times a day (BID) | ORAL | 4 refills | Status: DC

## 2019-03-20 MED ORDER — SODIUM CHLORIDE 0.9 % IV SOLN
2140.0000 mg/m2 | INTRAVENOUS | Status: DC
  Administered 2019-03-20: 5000 mg via INTRAVENOUS
  Filled 2019-03-20: qty 100

## 2019-03-20 MED ORDER — PALONOSETRON HCL INJECTION 0.25 MG/5ML
INTRAVENOUS | Status: AC
  Filled 2019-03-20: qty 5

## 2019-03-20 MED ORDER — SODIUM CHLORIDE 0.9 % IV SOLN
Freq: Once | INTRAVENOUS | Status: AC
  Filled 2019-03-20: qty 250

## 2019-03-20 MED ORDER — SODIUM CHLORIDE 0.9 % IV SOLN
400.0000 mg/m2 | Freq: Once | INTRAVENOUS | Status: AC
  Administered 2019-03-20: 936 mg via INTRAVENOUS
  Filled 2019-03-20: qty 46.8

## 2019-03-20 MED ORDER — ATROPINE SULFATE 1 MG/ML IJ SOLN
0.5000 mg | Freq: Once | INTRAMUSCULAR | Status: AC | PRN
  Administered 2019-03-20: 0.5 mg via INTRAVENOUS

## 2019-03-20 MED ORDER — DEXAMETHASONE SODIUM PHOSPHATE 10 MG/ML IJ SOLN
INTRAMUSCULAR | Status: AC
  Filled 2019-03-20: qty 1

## 2019-03-20 MED ORDER — PALONOSETRON HCL INJECTION 0.25 MG/5ML
0.2500 mg | Freq: Once | INTRAVENOUS | Status: AC
  Administered 2019-03-20: 0.25 mg via INTRAVENOUS

## 2019-03-20 NOTE — Telephone Encounter (Signed)
-----   Message from Zachary Napoleon, MD sent at 03/20/2019  1:58 PM EDT ----- Call - the CEA is down to 113!!!  This is great news!!  [name]

## 2019-03-20 NOTE — Patient Instructions (Signed)

## 2019-03-20 NOTE — Progress Notes (Signed)
Hematology and Oncology Follow Up Visit  HIROSHI MCGLATHERY [id number] 07/30/1945 74 y.o. 03/20/2019   Principle Diagnosis:  Metastatic colon cancer - progressive  Current Therapy:   Status post RFA to the liver-11/21/2017  S/P Y-90 x 2 -- last therapy on 08/27/2018 FOLFIRI --  S/p cycle #4-- start on 01/09/2019     Interim History:  Mr. Mccalla is back for follow-up.  Unfortunately, he developed shingles after his last treatment.  The shingles are in the left T10 dermatome.  He went to his family doctor.  Sounds like he was put on acyclovir.  I think he needs to be on some kind of maintenance treatment.  The lesions have healed up quite nicely and are drying.  His PET scan was done yesterday.  He has had a nice response.  His liver lesions have improved.  There is small lung nodules which are too small to characterize.  His last CEA was down to 245.  Overall, he is managing pretty well.  He is having no problems with bowels or bladder.  He does have some allergy issues.  There is been no bleeding.  His heart has been doing okay.  He has had no chest pain.  Overall, his performance status is ECOG 1.  Medications:  Current Outpatient Medications:  .  allopurinol (ZYLOPRIM) 300 MG tablet, Take 300 mg by mouth every morning. , Disp: , Rfl:  .  atorvastatin (LIPITOR) 40 MG tablet, Take 40 mg by mouth daily., Disp: , Rfl:  .  carvedilol (COREG) 6.25 MG tablet, TAKE 1 TABLET (6.25 MG TOTAL) BY MOUTH 2 TIMES DAILY WITH MEALS., Disp: , Rfl:  .  [employer] ASPIRIN ADULT LOW DOSE 81 MG chewable tablet, Chew 81 mg by mouth daily., Disp: , Rfl:  .  [employer] STOOL SOFTENER/LAXATIVE 8.6-50 MG tablet, TAKE 2 TABLETS EVERY EVENING, Disp: , Rfl:  .  dexamethasone (DECADRON) 4 MG tablet, Take 2 tablets (8 mg total) by mouth daily. Start the day after chemo for 2 days., Disp: 8 tablet, Rfl: 5 .  ferrous sulfate 325 (65 FE) MG tablet, Take 325 mg by mouth daily., Disp: , Rfl:  .  furosemide (LASIX) 20 MG tablet, Take  20 mg by mouth daily. , Disp: , Rfl:  .  hydrocortisone (CORTEF) 10 MG tablet, Take 5-15 mg by mouth See admin instructions. Takes 15 mg in am and 5mg  in afternoon., Disp: , Rfl: 4 .  levothyroxine (SYNTHROID, LEVOTHROID) 88 MCG tablet, Take 88 mcg by mouth daily before breakfast. , Disp: , Rfl:  .  pantoprazole (PROTONIX) 40 MG tablet, Take 40 mg by mouth every morning. , Disp: , Rfl:  .  polyvinyl alcohol (LIQUIFILM TEARS) 1.4 % ophthalmic solution, Place 1 drop into both eyes daily as needed for dry eyes. , Disp: , Rfl:  .  potassium chloride (KLOR-CON 10) 10 MEQ tablet, Take 20 mEq by mouth daily., Disp: , Rfl:  .  testosterone (ANDRODERM) 4 MG/24HR PT24 patch, Place 1 patch onto the skin daily. , Disp: , Rfl:  .  warfarin (COUMADIN) 5 MG tablet, Take 5 mg by mouth daily., Disp: , Rfl: 0 .  lidocaine-prilocaine (EMLA) cream, Apply to affected area once (Patient not taking: Reported on 03/20/2019), Disp: 30 g, Rfl: 3 .  loperamide (IMODIUM A-D) 2 MG tablet, Take 1 tablet (2 mg total) by mouth 4 (four) times daily as needed. Take 2 at diarrhea onset , then 1 every 2hr until 12hrs with no BM. May take  2 every 4hrs at night. If diarrhea recurs repeat. (Patient not taking: Reported on 03/20/2019), Disp: 100 tablet, Rfl: 1 .  loperamide (IMODIUM) 2 MG capsule, Take 2 mg by mouth as needed for diarrhea or loose stools. , Disp: , Rfl:  .  LORazepam (ATIVAN) 1 MG tablet, Take 1 tablet (1 mg total) by mouth every 6 (six) hours as needed (NAUSEA). (Patient not taking: Reported on 03/20/2019), Disp: 30 tablet, Rfl: 0 .  NASAL WASH NA, Place 1 spray into the nose daily. , Disp: , Rfl:  .  ondansetron (ZOFRAN) 8 MG tablet, Take 1 tablet (8 mg total) by mouth 2 (two) times daily as needed for refractory nausea / vomiting. Start on day 3 after chemotherapy. (Patient not taking: Reported on 03/20/2019), Disp: 30 tablet, Rfl: 1 .  prochlorperazine (COMPAZINE) 10 MG tablet, Take 1 tablet (10 mg total) by mouth every 6  (six) hours as needed (NAUSEA). (Patient not taking: Reported on 03/20/2019), Disp: 30 tablet, Rfl: 1  Allergies: No Known Allergies  Past Medical History, Surgical history, Social history, and Family History were reviewed and updated.  Review of Systems: Review of Systems  Constitutional: Negative.   HENT:  Negative.   Eyes: Negative.   Respiratory: Negative.   Cardiovascular: Negative.   Gastrointestinal: Negative.   Endocrine: Negative.   Genitourinary: Negative.    Musculoskeletal: Negative.   Skin: Negative.   Neurological: Negative.   Hematological: Negative.   Psychiatric/Behavioral: Negative.     Physical Exam:  weight is 252 lb (114.3 kg). His temporal temperature is 96 F (35.6 C) (abnormal). His blood pressure is 116/78 and his pulse is 57 (abnormal). His respiration is 17 and oxygen saturation is 100%.   Wt Readings from Last 3 Encounters:  03/20/19 252 lb (114.3 kg)  02/20/19 248 lb (112.5 kg)  02/06/19 247 lb (112 kg)    Physical Exam Vitals reviewed.  HENT:     Head: Normocephalic and atraumatic.  Eyes:     Pupils: Pupils are equal, round, and reactive to light.  Cardiovascular:     Rate and Rhythm: Normal rate and regular rhythm.     Heart sounds: Normal heart sounds.     Comments: Regular rate and rhythm with an occasional extra beat. Pulmonary:     Effort: Pulmonary effort is normal.     Breath sounds: Normal breath sounds.  Abdominal:     General: Bowel sounds are normal.     Palpations: Abdomen is soft.     Comments: Abdomen is soft.  He has multiple laparotomy scars.  There is no fluid wave.  There is no palpable liver or spleen tip.  Musculoskeletal:        General: No tenderness or deformity. Normal range of motion.     Cervical back: Normal range of motion.  Lymphadenopathy:     Cervical: No cervical adenopathy.  Skin:    General: Skin is warm and dry.     Findings: No erythema or rash.     Comments: Skin exam does show the healing  shingles lesions in the left T10 dermatome.  Neurological:     Mental Status: He is alert and oriented to person, place, and time.  Psychiatric:        Behavior: Behavior normal.        Thought Content: Thought content normal.        Judgment: Judgment normal.      Lab Results  Component Value Date   WBC 6.5 03/20/2019  HGB 10.1 (L) 03/20/2019   HCT 31.9 (L) 03/20/2019   MCV 97.6 03/20/2019   PLT 115 (L) 03/20/2019     Chemistry      Component Value Date/Time   NA 140 03/20/2019 0900   K 3.6 03/20/2019 0900   CL 104 03/20/2019 0900   CO2 30 03/20/2019 0900   BUN 17 03/20/2019 0900   CREATININE 1.09 03/20/2019 0900      Component Value Date/Time   CALCIUM 8.7 (L) 03/20/2019 0900   ALKPHOS 163 (H) 03/20/2019 0900   AST 30 03/20/2019 0900   ALT 26 03/20/2019 0900   BILITOT 0.8 03/20/2019 0900       Impression and Plan: Mr. Isgro is a 74 year old white male with metastatic colon cancer.  I am glad that the PET scan showed things were improving.  The fact that his CEA is coming down is a good barometer for response.  I am sad that he had the shingles.  We will get him on acyclovir as a maintenance.  We will go ahead with his fifth cycle of treatment today.  He will come back in 3 weeks for his sixth cycle.        [name], MD 3/17/20219:52 AM

## 2019-03-20 NOTE — Telephone Encounter (Signed)
Appointments scheduled calendar printed per 3/17 los 

## 2019-03-20 NOTE — Patient Instructions (Signed)
[facility] Discharge Instructions for Patients Receiving Chemotherapy  Today you received the following chemotherapy agents Irinotecan, Leucovorin and 5FU.  To help prevent nausea and vomiting after your treatment, we encourage you to take your nausea medication as prescribed by MD. **DO NOT TAKE ZOFRAN FOR 3 DAYS AFTER TREATMENT**   If you develop nausea and vomiting that is not controlled by your nausea medication, call the clinic.   BELOW ARE SYMPTOMS THAT SHOULD BE REPORTED IMMEDIATELY:  *FEVER GREATER THAN 100.5 F  *CHILLS WITH OR WITHOUT FEVER  NAUSEA AND VOMITING THAT IS NOT CONTROLLED WITH YOUR NAUSEA MEDICATION  *UNUSUAL SHORTNESS OF BREATH  *UNUSUAL BRUISING OR BLEEDING  TENDERNESS IN MOUTH AND THROAT WITH OR WITHOUT PRESENCE OF ULCERS  *URINARY PROBLEMS  *BOWEL PROBLEMS  UNUSUAL RASH Items with * indicate a potential emergency and should be followed up as soon as possible.  Feel free to call the clinic should you have any questions or concerns. The clinic phone number is [phone].  Please show the [facility] at check-in to the Emergency Department and triage nurse.

## 2019-03-20 NOTE — Telephone Encounter (Signed)
Patient notified per order of Dr. Marin Olp that "the CEA is down to 113!!  This is great news!!"  Pt appreciative of call and has no questions or concerns at this time.

## 2019-03-22 ENCOUNTER — Inpatient Hospital Stay: Payer: Medicare HMO

## 2019-03-22 ENCOUNTER — Other Ambulatory Visit: Payer: Self-pay

## 2019-03-22 VITALS — BP 141/89 | HR 69 | Temp 96.0°F | Resp 19

## 2019-03-22 DIAGNOSIS — C189 Malignant neoplasm of colon, unspecified: Secondary | ICD-10-CM

## 2019-03-22 DIAGNOSIS — Z5111 Encounter for antineoplastic chemotherapy: Secondary | ICD-10-CM | POA: Diagnosis not present

## 2019-03-22 MED ORDER — HEPARIN SOD (PORK) LOCK FLUSH 100 UNIT/ML IV SOLN
500.0000 [IU] | Freq: Once | INTRAVENOUS | Status: AC | PRN
  Administered 2019-03-22: 500 [IU]
  Filled 2019-03-22: qty 5

## 2019-03-22 MED ORDER — SODIUM CHLORIDE 0.9% FLUSH
10.0000 mL | INTRAVENOUS | Status: DC | PRN
  Administered 2019-03-22: 10 mL
  Filled 2019-03-22: qty 10

## 2019-03-22 NOTE — Patient Instructions (Signed)
Fluorouracil, 5-FU injection What is this medicine? FLUOROURACIL, 5-FU (flure oh YOOR a sil) is a chemotherapy drug. It slows the growth of cancer cells. This medicine is used to treat many types of cancer like breast cancer, colon or rectal cancer, pancreatic cancer, and stomach cancer. This medicine may be used for other purposes; ask your health care provider or pharmacist if you have questions. COMMON BRAND NAME(S): Adrucil What should I tell my health care provider before I take this medicine? They need to know if you have any of these conditions:  blood disorders  dihydropyrimidine dehydrogenase (DPD) deficiency  infection (especially a virus infection such as chickenpox, cold sores, or herpes)  kidney disease  liver disease  malnourished, poor nutrition  recent or ongoing radiation therapy  an unusual or allergic reaction to fluorouracil, other chemotherapy, other medicines, foods, dyes, or preservatives  pregnant or trying to get pregnant  breast-feeding How should I use this medicine? This drug is given as an infusion or injection into a vein. It is administered in a hospital or clinic by a specially trained health care professional. Talk to your pediatrician regarding the use of this medicine in children. Special care may be needed. Overdosage: If you think you have taken too much of this medicine contact a poison control center or emergency room at once. NOTE: This medicine is only for you. Do not share this medicine with others. What if I miss a dose? It is important not to miss your dose. Call your doctor or health care professional if you are unable to keep an appointment. What may interact with this medicine?  allopurinol  cimetidine  dapsone  digoxin  hydroxyurea  leucovorin  levamisole  medicines for seizures like ethotoin, fosphenytoin, phenytoin  medicines to increase blood counts like filgrastim, pegfilgrastim, sargramostim  medicines that  treat or prevent blood clots like warfarin, enoxaparin, and dalteparin  methotrexate  metronidazole  pyrimethamine  some other chemotherapy drugs like busulfan, cisplatin, estramustine, vinblastine  trimethoprim  trimetrexate  vaccines Talk to your doctor or health care professional before taking any of these medicines:  acetaminophen  aspirin  ibuprofen  ketoprofen  naproxen This list may not describe all possible interactions. Give your health care provider a list of all the medicines, herbs, non-prescription drugs, or dietary supplements you use. Also tell them if you smoke, drink alcohol, or use illegal drugs. Some items may interact with your medicine. What should I watch for while using this medicine? Visit your doctor for checks on your progress. This drug may make you feel generally unwell. This is not uncommon, as chemotherapy can affect healthy cells as well as cancer cells. Report any side effects. Continue your course of treatment even though you feel ill unless your doctor tells you to stop. In some cases, you may be given additional medicines to help with side effects. Follow all directions for their use. Call your doctor or health care professional for advice if you get a fever, chills or sore throat, or other symptoms of a cold or flu. Do not treat yourself. This drug decreases your body's ability to fight infections. Try to avoid being around people who are sick. This medicine may increase your risk to bruise or bleed. Call your doctor or health care professional if you notice any unusual bleeding. Be careful brushing and flossing your teeth or using a toothpick because you may get an infection or bleed more easily. If you have any dental work done, tell your dentist you are   receiving this medicine. Avoid taking products that contain aspirin, acetaminophen, ibuprofen, naproxen, or ketoprofen unless instructed by your doctor. These medicines may hide a fever. Do not  become pregnant while taking this medicine. Women should inform their doctor if they wish to become pregnant or think they might be pregnant. There is a potential for serious side effects to an unborn child. Talk to your health care professional or pharmacist for more information. Do not breast-feed an infant while taking this medicine. Men should inform their doctor if they wish to father a child. This medicine may lower sperm counts. Do not treat diarrhea with over the counter products. Contact your doctor if you have diarrhea that lasts more than 2 days or if it is severe and watery. This medicine can make you more sensitive to the sun. Keep out of the sun. If you cannot avoid being in the sun, wear protective clothing and use sunscreen. Do not use sun lamps or tanning beds/booths. What side effects may I notice from receiving this medicine? Side effects that you should report to your doctor or health care professional as soon as possible:  allergic reactions like skin rash, itching or hives, swelling of the face, lips, or tongue  low blood counts - this medicine may decrease the number of white blood cells, red blood cells and platelets. You may be at increased risk for infections and bleeding.  signs of infection - fever or chills, cough, sore throat, pain or difficulty passing urine  signs of decreased platelets or bleeding - bruising, pinpoint red spots on the skin, black, tarry stools, blood in the urine  signs of decreased red blood cells - unusually weak or tired, fainting spells, lightheadedness  breathing problems  changes in vision  chest pain  mouth sores  nausea and vomiting  pain, swelling, redness at site where injected  pain, tingling, numbness in the hands or feet  redness, swelling, or sores on hands or feet  stomach pain  unusual bleeding Side effects that usually do not require medical attention (report to your doctor or health care professional if they  continue or are bothersome):  changes in finger or toe nails  diarrhea  dry or itchy skin  hair loss  headache  loss of appetite  sensitivity of eyes to the light  stomach upset  unusually teary eyes This list may not describe all possible side effects. Call your doctor for medical advice about side effects. You may report side effects to FDA at 1-800-FDA-1088. Where should I keep my medicine? This drug is given in a hospital or clinic and will not be stored at home. NOTE: This sheet is a summary. It may not cover all possible information. If you have questions about this medicine, talk to your doctor, pharmacist, or health care provider.  2020 Elsevier/Gold Standard (2007-04-25 13:53:16)  

## 2019-04-03 ENCOUNTER — Other Ambulatory Visit: Payer: Medicare HMO

## 2019-04-03 ENCOUNTER — Ambulatory Visit: Payer: Medicare HMO | Admitting: Hematology & Oncology

## 2019-04-03 ENCOUNTER — Ambulatory Visit: Payer: Medicare HMO

## 2019-04-03 NOTE — Progress Notes (Signed)
Pharmacist Chemotherapy Monitoring - Follow Up Assessment    I verify that I have reviewed each item in the below checklist:  . Regimen for the patient is scheduled for the appropriate day and plan matches scheduled date. Marland Kitchen Appropriate non-routine labs are ordered dependent on drug ordered. . If applicable, additional medications reviewed and ordered per protocol based on lifetime cumulative doses and/or treatment regimen.   Plan for follow-up and/or issues identified: No . I-vent associated with next due treatment: No . MD and/or nursing notified: No  Holden Maniscalco, [name] 04/03/2019 8:13 AM

## 2019-04-10 ENCOUNTER — Telehealth: Payer: Self-pay | Admitting: Family

## 2019-04-10 ENCOUNTER — Inpatient Hospital Stay (HOSPITAL_BASED_OUTPATIENT_CLINIC_OR_DEPARTMENT_OTHER): Payer: Medicare HMO | Admitting: Family

## 2019-04-10 ENCOUNTER — Inpatient Hospital Stay: Payer: Medicare HMO | Attending: Hematology & Oncology

## 2019-04-10 ENCOUNTER — Encounter: Payer: Self-pay | Admitting: Family

## 2019-04-10 ENCOUNTER — Inpatient Hospital Stay: Payer: Medicare HMO

## 2019-04-10 ENCOUNTER — Other Ambulatory Visit: Payer: Self-pay

## 2019-04-10 VITALS — BP 120/71 | HR 72 | Temp 98.0°F | Resp 19 | Ht 70.0 in | Wt 253.1 lb

## 2019-04-10 DIAGNOSIS — D508 Other iron deficiency anemias: Secondary | ICD-10-CM

## 2019-04-10 DIAGNOSIS — Z79899 Other long term (current) drug therapy: Secondary | ICD-10-CM | POA: Diagnosis not present

## 2019-04-10 DIAGNOSIS — C799 Secondary malignant neoplasm of unspecified site: Secondary | ICD-10-CM | POA: Insufficient documentation

## 2019-04-10 DIAGNOSIS — Z5189 Encounter for other specified aftercare: Secondary | ICD-10-CM | POA: Insufficient documentation

## 2019-04-10 DIAGNOSIS — C189 Malignant neoplasm of colon, unspecified: Secondary | ICD-10-CM

## 2019-04-10 DIAGNOSIS — Z7901 Long term (current) use of anticoagulants: Secondary | ICD-10-CM | POA: Insufficient documentation

## 2019-04-10 DIAGNOSIS — Z5111 Encounter for antineoplastic chemotherapy: Secondary | ICD-10-CM | POA: Diagnosis present

## 2019-04-10 DIAGNOSIS — C787 Secondary malignant neoplasm of liver and intrahepatic bile duct: Secondary | ICD-10-CM

## 2019-04-10 LAB — CBC WITH DIFFERENTIAL (CANCER CENTER ONLY)
Abs Immature Granulocytes: 0.07 10*3/uL (ref 0.00–0.07)
Basophils Absolute: 0 10*3/uL (ref 0.0–0.1)
Basophils Relative: 1 %
Eosinophils Absolute: 0.1 10*3/uL (ref 0.0–0.5)
Eosinophils Relative: 4 %
HCT: 32.2 % — ABNORMAL LOW (ref 39.0–52.0)
Hemoglobin: 10 g/dL — ABNORMAL LOW (ref 13.0–17.0)
Immature Granulocytes: 3 %
Lymphocytes Relative: 28 %
Lymphs Abs: 0.8 10*3/uL (ref 0.7–4.0)
MCH: 30.5 pg (ref 26.0–34.0)
MCHC: 31.1 g/dL (ref 30.0–36.0)
MCV: 98.2 fL (ref 80.0–100.0)
Monocytes Absolute: 0.4 10*3/uL (ref 0.1–1.0)
Monocytes Relative: 14 %
Neutro Abs: 1.4 10*3/uL — ABNORMAL LOW (ref 1.7–7.7)
Neutrophils Relative %: 50 %
Platelet Count: 106 10*3/uL — ABNORMAL LOW (ref 150–400)
RBC: 3.28 MIL/uL — ABNORMAL LOW (ref 4.22–5.81)
RDW: 17.8 % — ABNORMAL HIGH (ref 11.5–15.5)
WBC Count: 2.8 10*3/uL — ABNORMAL LOW (ref 4.0–10.5)
nRBC: 2.5 % — ABNORMAL HIGH (ref 0.0–0.2)

## 2019-04-10 LAB — CMP (CANCER CENTER ONLY)
ALT: 26 U/L (ref 0–44)
AST: 32 U/L (ref 15–41)
Albumin: 3.3 g/dL — ABNORMAL LOW (ref 3.5–5.0)
Alkaline Phosphatase: 156 U/L — ABNORMAL HIGH (ref 38–126)
Anion gap: 6 (ref 5–15)
BUN: 16 mg/dL (ref 8–23)
CO2: 30 mmol/L (ref 22–32)
Calcium: 8.9 mg/dL (ref 8.9–10.3)
Chloride: 105 mmol/L (ref 98–111)
Creatinine: 1.22 mg/dL (ref 0.61–1.24)
GFR, Est AFR Am: >60 mL/min (ref >60)
GFR, Estimated: 59 mL/min — ABNORMAL LOW (ref >60)
Glucose, Bld: 207 mg/dL — ABNORMAL HIGH (ref 70–99)
Potassium: 3.8 mmol/L (ref 3.5–5.1)
Sodium: 141 mmol/L (ref 135–145)
Total Bilirubin: 1.1 mg/dL (ref 0.3–1.2)
Total Protein: 5.5 g/dL — ABNORMAL LOW (ref 6.5–8.1)

## 2019-04-10 LAB — CEA (IN HOUSE-CHCC): CEA (CHCC-In House): 130.12 ng/mL — ABNORMAL HIGH (ref 0.00–5.00)

## 2019-04-10 MED ORDER — SODIUM CHLORIDE 0.9 % IV SOLN
Freq: Once | INTRAVENOUS | Status: AC
  Filled 2019-04-10: qty 250

## 2019-04-10 MED ORDER — FLUOROURACIL CHEMO INJECTION 2.5 GM/50ML
400.0000 mg/m2 | Freq: Once | INTRAVENOUS | Status: AC
  Administered 2019-04-10: 950 mg via INTRAVENOUS
  Filled 2019-04-10: qty 19

## 2019-04-10 MED ORDER — SODIUM CHLORIDE 0.9 % IV SOLN
400.0000 mg/m2 | Freq: Once | INTRAVENOUS | Status: AC
  Administered 2019-04-10: 936 mg via INTRAVENOUS
  Filled 2019-04-10: qty 46.8

## 2019-04-10 MED ORDER — ATROPINE SULFATE 1 MG/ML IJ SOLN
INTRAMUSCULAR | Status: AC
  Filled 2019-04-10: qty 1

## 2019-04-10 MED ORDER — SODIUM CHLORIDE 0.9 % IV SOLN
10.0000 mg | Freq: Once | INTRAVENOUS | Status: AC
  Administered 2019-04-10: 11:00:00 10 mg via INTRAVENOUS
  Filled 2019-04-10: qty 10

## 2019-04-10 MED ORDER — SODIUM CHLORIDE 0.9 % IV SOLN
144.0000 mg/m2 | Freq: Once | INTRAVENOUS | Status: AC
  Administered 2019-04-10: 340 mg via INTRAVENOUS
  Filled 2019-04-10: qty 15

## 2019-04-10 MED ORDER — SODIUM CHLORIDE 0.9 % IV SOLN
2140.0000 mg/m2 | INTRAVENOUS | Status: DC
  Administered 2019-04-10: 5000 mg via INTRAVENOUS
  Filled 2019-04-10: qty 100

## 2019-04-10 MED ORDER — PALONOSETRON HCL INJECTION 0.25 MG/5ML
INTRAVENOUS | Status: AC
  Filled 2019-04-10: qty 5

## 2019-04-10 MED ORDER — PALONOSETRON HCL INJECTION 0.25 MG/5ML
0.2500 mg | Freq: Once | INTRAVENOUS | Status: AC
  Administered 2019-04-10: 0.25 mg via INTRAVENOUS

## 2019-04-10 MED ORDER — ATROPINE SULFATE 1 MG/ML IJ SOLN
0.5000 mg | Freq: Once | INTRAMUSCULAR | Status: AC | PRN
  Administered 2019-04-10: 0.5 mg via INTRAVENOUS

## 2019-04-10 NOTE — Patient Instructions (Signed)
[facility] Discharge Instructions for Patients Receiving Chemotherapy  Today you received the following chemotherapy agents Irinotecan, Leucovorin and 5FU.  To help prevent nausea and vomiting after your treatment, we encourage you to take your nausea medication as prescribed by MD. **DO NOT TAKE ZOFRAN FOR 3 DAYS AFTER TREATMENT**   If you develop nausea and vomiting that is not controlled by your nausea medication, call the clinic.   BELOW ARE SYMPTOMS THAT SHOULD BE REPORTED IMMEDIATELY:  *FEVER GREATER THAN 100.5 F  *CHILLS WITH OR WITHOUT FEVER  NAUSEA AND VOMITING THAT IS NOT CONTROLLED WITH YOUR NAUSEA MEDICATION  *UNUSUAL SHORTNESS OF BREATH  *UNUSUAL BRUISING OR BLEEDING  TENDERNESS IN MOUTH AND THROAT WITH OR WITHOUT PRESENCE OF ULCERS  *URINARY PROBLEMS  *BOWEL PROBLEMS  UNUSUAL RASH Items with * indicate a potential emergency and should be followed up as soon as possible.  Feel free to call the clinic should you have any questions or concerns. The clinic phone number is [phone].  Please show the [facility] at check-in to the Emergency Department and triage nurse.

## 2019-04-10 NOTE — Progress Notes (Signed)
Per Lamar Blinks to treat with ANC 1.4

## 2019-04-10 NOTE — Patient Instructions (Signed)

## 2019-04-10 NOTE — Progress Notes (Signed)
Hematology and Oncology Follow Up Visit  ROMONE CZAPLEWSKI [id number] Sep 23, 1945 74 y.o. 04/10/2019   Principle Diagnosis:  Metastatic colon cancer - progressive  Past Therapy: Status post RFA to the liver-11/21/2017  S/P Y-90 x 2 -- last therapy on 08/27/2018  Current Therapy:        FOLFIRI --  S/p cycle 5-- start on 01/09/2019   Interim History:  Mr. Parvin is here today for follow-up and treatment. He is feeling better and states that his shingles rash has almost completely resolved. He has 2 small spots that are drying out on his left back. He is tolerating the Acyclovir nicely with no side effects.  His most recent CEA in March was down to 113.  No fever, chills, n/v, cough, dizziness, chest pain, palpitations, abdominal pain or changes in bowel or bladder habits.  He has some mild SOB with over exertion and will take a break to rest as needed.  No swelling, numbness or tingling in his extremities at this time.  He is careful with his fluid intake and feels that he is hydrating properly. He has a good appetite. He bruises easily on Coumadin but has not noted any episodes of blood loss. No petechiae noted.   ECOG Performance Status: 1 - Symptomatic but completely ambulatory  Medications:  Allergies as of 04/10/2019   No Known Allergies     Medication List       Accurate as of April 10, 2019  9:17 AM. If you have any questions, ask your nurse or doctor.        acyclovir 400 MG tablet Commonly known as: ZOVIRAX Take 1 tablet (400 mg total) by mouth 2 (two) times daily.   allopurinol 300 MG tablet Commonly known as: ZYLOPRIM Take 300 mg by mouth every morning.   atorvastatin 40 MG tablet Commonly known as: LIPITOR Take 40 mg by mouth daily.   carvedilol 6.25 MG tablet Commonly known as: COREG TAKE 1 TABLET (6.25 MG TOTAL) BY MOUTH 2 TIMES DAILY WITH MEALS.   [employer] Aspirin Adult Low Dose 81 MG chewable tablet Generic drug: aspirin Chew 81 mg by mouth daily.   [employer] Stool  Softener/Laxative 8.6-50 MG tablet Generic drug: senna-docusate TAKE 2 TABLETS EVERY EVENING   dexamethasone 4 MG tablet Commonly known as: DECADRON Take 2 tablets (8 mg total) by mouth daily. Start the day after chemo for 2 days.   ferrous sulfate 325 (65 FE) MG tablet Take 325 mg by mouth daily.   furosemide 20 MG tablet Commonly known as: LASIX Take 20 mg by mouth daily.   hydrocortisone 10 MG tablet Commonly known as: CORTEF Take 5-15 mg by mouth See admin instructions. Takes 15 mg in am and 5mg  in afternoon.   Klor-Con 10 10 MEQ tablet Generic drug: potassium chloride Take 20 mEq by mouth daily.   levothyroxine 88 MCG tablet Commonly known as: SYNTHROID Take 88 mcg by mouth daily before breakfast.   lidocaine-prilocaine cream Commonly known as: EMLA Apply to affected area once   loperamide 2 MG capsule Commonly known as: IMODIUM Take 2 mg by mouth as needed for diarrhea or loose stools.   loperamide 2 MG tablet Commonly known as: Imodium A-D Take 1 tablet (2 mg total) by mouth 4 (four) times daily as needed. Take 2 at diarrhea onset , then 1 every 2hr until 12hrs with no BM. May take 2 every 4hrs at night. If diarrhea recurs repeat.   LORazepam 1 MG tablet Commonly known as:  Ativan Take 1 tablet (1 mg total) by mouth every 6 (six) hours as needed (NAUSEA).   NASAL Ada NA Place 1 spray into the nose daily.   ondansetron 8 MG tablet Commonly known as: ZOFRAN Take 1 tablet (8 mg total) by mouth 2 (two) times daily as needed for refractory nausea / vomiting. Start on day 3 after chemotherapy.   pantoprazole 40 MG tablet Commonly known as: PROTONIX Take 40 mg by mouth every morning.   polyvinyl alcohol 1.4 % ophthalmic solution Commonly known as: LIQUIFILM TEARS Place 1 drop into both eyes daily as needed for dry eyes.   prochlorperazine 10 MG tablet Commonly known as: COMPAZINE Take 1 tablet (10 mg total) by mouth every 6 (six) hours as needed (NAUSEA).    testosterone 4 MG/24HR Pt24 patch Commonly known as: ANDRODERM Place 1 patch onto the skin daily.   warfarin 5 MG tablet Commonly known as: COUMADIN Take 5 mg by mouth daily.       Allergies: No Known Allergies  Past Medical History, Surgical history, Social history, and Family History were reviewed and updated.  Review of Systems: All other 10 point review of systems is negative.   Physical Exam:  vitals were not taken for this visit.   Wt Readings from Last 3 Encounters:  03/20/19 252 lb (114.3 kg)  02/20/19 248 lb (112.5 kg)  02/06/19 247 lb (112 kg)    Ocular: Sclerae unicteric, pupils equal, round and reactive to light Ear-nose-throat: Oropharynx clear, dentition fair Lymphatic: No cervical or supraclavicular adenopathy Lungs no rales or rhonchi, good excursion bilaterally Heart irregular rate and rhythm, no murmur appreciated Abd soft, nontender, positive bowel sounds, no liver or spleen tip palpated on exam, no fluid wave  MSK no focal spinal tenderness, no joint edema Neuro: non-focal, well-oriented, appropriate affect Breasts: Deferred   Lab Results  Component Value Date   WBC 6.5 03/20/2019   HGB 10.1 (L) 03/20/2019   HCT 31.9 (L) 03/20/2019   MCV 97.6 03/20/2019   PLT 115 (L) 03/20/2019   No results found for: FERRITIN, IRON, TIBC, UIBC, IRONPCTSAT Lab Results  Component Value Date   RBC 3.27 (L) 03/20/2019   No results found for: KPAFRELGTCHN, LAMBDASER, KAPLAMBRATIO No results found for: IGGSERUM, IGA, IGMSERUM No results found for: Ronnald Ramp, A1GS, A2GS, BETS, BETA2SER, GAMS, MSPIKE, SPEI   Chemistry      Component Value Date/Time   NA 140 03/20/2019 0900   K 3.6 03/20/2019 0900   CL 104 03/20/2019 0900   CO2 30 03/20/2019 0900   BUN 17 03/20/2019 0900   CREATININE 1.09 03/20/2019 0900      Component Value Date/Time   CALCIUM 8.7 (L) 03/20/2019 0900   ALKPHOS 163 (H) 03/20/2019 0900   AST 30 03/20/2019 0900   ALT 26  03/20/2019 0900   BILITOT 0.8 03/20/2019 0900       Impression and Plan: Mr. Biddy is a very pleasant 74 yo caucasian gentleman with metastatic colon cancer.  His recent PET scan showed improvement and CEA was down to 113.  Labs reviewed with Dr. Marin Olp.  We will proceed with cycle 6 of treatment today as planned and see him again in 3 weeks.  He will contact our office with any questions or concerns. We can certainly see him sooner if needed.   Laverna Peace, NP 4/7/20219:17 AM

## 2019-04-10 NOTE — Telephone Encounter (Signed)
Appointments were previously scheduled as requested per 4/7 los

## 2019-04-12 ENCOUNTER — Inpatient Hospital Stay: Payer: Medicare HMO

## 2019-04-12 ENCOUNTER — Other Ambulatory Visit: Payer: Self-pay

## 2019-04-12 VITALS — HR 66 | Temp 97.7°F | Resp 18

## 2019-04-12 DIAGNOSIS — Z5111 Encounter for antineoplastic chemotherapy: Secondary | ICD-10-CM | POA: Diagnosis not present

## 2019-04-12 DIAGNOSIS — C787 Secondary malignant neoplasm of liver and intrahepatic bile duct: Secondary | ICD-10-CM

## 2019-04-12 DIAGNOSIS — C189 Malignant neoplasm of colon, unspecified: Secondary | ICD-10-CM

## 2019-04-12 MED ORDER — SODIUM CHLORIDE 0.9% FLUSH
10.0000 mL | INTRAVENOUS | Status: DC | PRN
  Administered 2019-04-12: 10 mL
  Filled 2019-04-12: qty 10

## 2019-04-12 MED ORDER — HEPARIN SOD (PORK) LOCK FLUSH 100 UNIT/ML IV SOLN
500.0000 [IU] | Freq: Once | INTRAVENOUS | Status: AC | PRN
  Administered 2019-04-12: 12:00:00 500 [IU]
  Filled 2019-04-12: qty 5

## 2019-04-24 ENCOUNTER — Ambulatory Visit: Payer: Medicare HMO | Admitting: Hematology & Oncology

## 2019-04-24 ENCOUNTER — Other Ambulatory Visit: Payer: Medicare HMO

## 2019-04-24 ENCOUNTER — Ambulatory Visit: Payer: Medicare HMO

## 2019-04-24 NOTE — Progress Notes (Signed)
Pharmacist Chemotherapy Monitoring - Follow Up Assessment    I verify that I have reviewed each item in the below checklist:  . Regimen for the patient is scheduled for the appropriate day and plan matches scheduled date. Marland Kitchen Appropriate non-routine labs are ordered dependent on drug ordered. . If applicable, additional medications reviewed and ordered per protocol based on lifetime cumulative doses and/or treatment regimen.   Plan for follow-up and/or issues identified: No . I-vent associated with next due treatment: No . MD and/or nursing notified: No  Zachary Willis, Zachary Willis 04/24/2019 8:30 AM

## 2019-05-01 ENCOUNTER — Inpatient Hospital Stay: Payer: Medicare HMO

## 2019-05-01 ENCOUNTER — Other Ambulatory Visit: Payer: Self-pay

## 2019-05-01 ENCOUNTER — Encounter: Payer: Self-pay | Admitting: Hematology & Oncology

## 2019-05-01 ENCOUNTER — Inpatient Hospital Stay (HOSPITAL_BASED_OUTPATIENT_CLINIC_OR_DEPARTMENT_OTHER): Payer: Medicare HMO | Admitting: Hematology & Oncology

## 2019-05-01 VITALS — BP 153/78 | HR 66 | Temp 97.9°F | Resp 18 | Wt 251.0 lb

## 2019-05-01 DIAGNOSIS — C189 Malignant neoplasm of colon, unspecified: Secondary | ICD-10-CM

## 2019-05-01 DIAGNOSIS — C787 Secondary malignant neoplasm of liver and intrahepatic bile duct: Secondary | ICD-10-CM | POA: Diagnosis not present

## 2019-05-01 DIAGNOSIS — D508 Other iron deficiency anemias: Secondary | ICD-10-CM

## 2019-05-01 DIAGNOSIS — Z5111 Encounter for antineoplastic chemotherapy: Secondary | ICD-10-CM | POA: Diagnosis not present

## 2019-05-01 LAB — CBC WITH DIFFERENTIAL (CANCER CENTER ONLY)
Abs Immature Granulocytes: 0.11 10*3/uL — ABNORMAL HIGH (ref 0.00–0.07)
Basophils Absolute: 0 10*3/uL (ref 0.0–0.1)
Basophils Relative: 2 %
Eosinophils Absolute: 0.1 10*3/uL (ref 0.0–0.5)
Eosinophils Relative: 5 %
HCT: 32.3 % — ABNORMAL LOW (ref 39.0–52.0)
Hemoglobin: 10.2 g/dL — ABNORMAL LOW (ref 13.0–17.0)
Immature Granulocytes: 4 %
Lymphocytes Relative: 36 %
Lymphs Abs: 1 10*3/uL (ref 0.7–4.0)
MCH: 31.1 pg (ref 26.0–34.0)
MCHC: 31.6 g/dL (ref 30.0–36.0)
MCV: 98.5 fL (ref 80.0–100.0)
Monocytes Absolute: 0.5 10*3/uL (ref 0.1–1.0)
Monocytes Relative: 19 %
Neutro Abs: 0.9 10*3/uL — ABNORMAL LOW (ref 1.7–7.7)
Neutrophils Relative %: 34 %
Platelet Count: 115 10*3/uL — ABNORMAL LOW (ref 150–400)
RBC: 3.28 MIL/uL — ABNORMAL LOW (ref 4.22–5.81)
RDW: 16.8 % — ABNORMAL HIGH (ref 11.5–15.5)
WBC Count: 2.6 10*3/uL — ABNORMAL LOW (ref 4.0–10.5)
nRBC: 2.3 % — ABNORMAL HIGH (ref 0.0–0.2)

## 2019-05-01 LAB — CMP (CANCER CENTER ONLY)
ALT: 26 U/L (ref 0–44)
AST: 37 U/L (ref 15–41)
Albumin: 3.4 g/dL — ABNORMAL LOW (ref 3.5–5.0)
Alkaline Phosphatase: 164 U/L — ABNORMAL HIGH (ref 38–126)
Anion gap: 7 (ref 5–15)
BUN: 21 mg/dL (ref 8–23)
CO2: 30 mmol/L (ref 22–32)
Calcium: 9.1 mg/dL (ref 8.9–10.3)
Chloride: 106 mmol/L (ref 98–111)
Creatinine: 1.27 mg/dL — ABNORMAL HIGH (ref 0.61–1.24)
GFR, Est AFR Am: >60 mL/min (ref >60)
GFR, Estimated: 56 mL/min — ABNORMAL LOW (ref >60)
Glucose, Bld: 152 mg/dL — ABNORMAL HIGH (ref 70–99)
Potassium: 3.8 mmol/L (ref 3.5–5.1)
Sodium: 143 mmol/L (ref 135–145)
Total Bilirubin: 1.2 mg/dL (ref 0.3–1.2)
Total Protein: 5.7 g/dL — ABNORMAL LOW (ref 6.5–8.1)

## 2019-05-01 LAB — IRON AND TIBC
Iron: 55 ug/dL (ref 42–163)
Saturation Ratios: 24 % (ref 20–55)
TIBC: 225 ug/dL (ref 202–409)
UIBC: 170 ug/dL (ref 117–376)

## 2019-05-01 LAB — FERRITIN: Ferritin: 204 ng/mL (ref 24–336)

## 2019-05-01 LAB — CEA (IN HOUSE-CHCC): CEA (CHCC-In House): 144.69 ng/mL — ABNORMAL HIGH (ref 0.00–5.00)

## 2019-05-01 MED ORDER — SODIUM CHLORIDE 0.9 % IV SOLN
400.0000 mg/m2 | Freq: Once | INTRAVENOUS | Status: AC
  Administered 2019-05-01: 936 mg via INTRAVENOUS
  Filled 2019-05-01: qty 46.8

## 2019-05-01 MED ORDER — ATROPINE SULFATE 1 MG/ML IJ SOLN
0.5000 mg | Freq: Once | INTRAMUSCULAR | Status: AC | PRN
  Administered 2019-05-01: 12:00:00 0.5 mg via INTRAVENOUS

## 2019-05-01 MED ORDER — SODIUM CHLORIDE 0.9 % IV SOLN
Freq: Once | INTRAVENOUS | Status: AC
  Filled 2019-05-01: qty 250

## 2019-05-01 MED ORDER — SODIUM CHLORIDE 0.9 % IV SOLN
10.0000 mg | Freq: Once | INTRAVENOUS | Status: AC
  Administered 2019-05-01: 10 mg via INTRAVENOUS
  Filled 2019-05-01: qty 1

## 2019-05-01 MED ORDER — ATROPINE SULFATE 1 MG/ML IJ SOLN
INTRAMUSCULAR | Status: AC
  Filled 2019-05-01: qty 1

## 2019-05-01 MED ORDER — SODIUM CHLORIDE 0.9 % IV SOLN
2140.0000 mg/m2 | INTRAVENOUS | Status: DC
  Administered 2019-05-01: 14:00:00 5000 mg via INTRAVENOUS
  Filled 2019-05-01: qty 100

## 2019-05-01 MED ORDER — FLUOROURACIL CHEMO INJECTION 2.5 GM/50ML
400.0000 mg/m2 | Freq: Once | INTRAVENOUS | Status: AC
  Administered 2019-05-01: 950 mg via INTRAVENOUS
  Filled 2019-05-01: qty 19

## 2019-05-01 MED ORDER — PALONOSETRON HCL INJECTION 0.25 MG/5ML
INTRAVENOUS | Status: AC
  Filled 2019-05-01: qty 5

## 2019-05-01 MED ORDER — PALONOSETRON HCL INJECTION 0.25 MG/5ML
0.2500 mg | Freq: Once | INTRAVENOUS | Status: AC
  Administered 2019-05-01: 10:00:00 0.25 mg via INTRAVENOUS

## 2019-05-01 MED ORDER — SODIUM CHLORIDE 0.9 % IV SOLN
144.0000 mg/m2 | Freq: Once | INTRAVENOUS | Status: AC
  Administered 2019-05-01: 340 mg via INTRAVENOUS
  Filled 2019-05-01: qty 15

## 2019-05-01 NOTE — Progress Notes (Signed)
Reviewed pt labs with Dr. Marin Olp (CBC) and pt ok to treat with ANC 0.9. Per Dr. Marin Olp pt to receive Neulasta with pump d/c appointment.

## 2019-05-01 NOTE — Patient Instructions (Signed)
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
## 2019-05-01 NOTE — Patient Instructions (Signed)
Fluorouracil, 5-FU injection What is this medicine? FLUOROURACIL, 5-FU (flure oh YOOR a sil) is a chemotherapy drug. It slows the growth of cancer cells. This medicine is used to treat many types of cancer like breast cancer, colon or rectal cancer, pancreatic cancer, and stomach cancer. This medicine may be used for other purposes; ask your health care provider or pharmacist if you have questions. COMMON BRAND NAME(S): Adrucil What should I tell my health care provider before I take this medicine? They need to know if you have any of these conditions:  blood disorders  dihydropyrimidine dehydrogenase (DPD) deficiency  infection (especially a virus infection such as chickenpox, cold sores, or herpes)  kidney disease  liver disease  malnourished, poor nutrition  recent or ongoing radiation therapy  an unusual or allergic reaction to fluorouracil, other chemotherapy, other medicines, foods, dyes, or preservatives  pregnant or trying to get pregnant  breast-feeding How should I use this medicine? This drug is given as an infusion or injection into a vein. It is administered in a hospital or clinic by a specially trained health care professional. Talk to your pediatrician regarding the use of this medicine in children. Special care may be needed. Overdosage: If you think you have taken too much of this medicine contact a poison control center or emergency room at once. NOTE: This medicine is only for you. Do not share this medicine with others. What if I miss a dose? It is important not to miss your dose. Call your doctor or health care professional if you are unable to keep an appointment. What may interact with this medicine?  allopurinol  cimetidine  dapsone  digoxin  hydroxyurea  leucovorin  levamisole  medicines for seizures like ethotoin, fosphenytoin, phenytoin  medicines to increase blood counts like filgrastim, pegfilgrastim, sargramostim  medicines that  treat or prevent blood clots like warfarin, enoxaparin, and dalteparin  methotrexate  metronidazole  pyrimethamine  some other chemotherapy drugs like busulfan, cisplatin, estramustine, vinblastine  trimethoprim  trimetrexate  vaccines Talk to your doctor or health care professional before taking any of these medicines:  acetaminophen  aspirin  ibuprofen  ketoprofen  naproxen This list may not describe all possible interactions. Give your health care provider a list of all the medicines, herbs, non-prescription drugs, or dietary supplements you use. Also tell them if you smoke, drink alcohol, or use illegal drugs. Some items may interact with your medicine. What should I watch for while using this medicine? Visit your doctor for checks on your progress. This drug may make you feel generally unwell. This is not uncommon, as chemotherapy can affect healthy cells as well as cancer cells. Report any side effects. Continue your course of treatment even though you feel ill unless your doctor tells you to stop. In some cases, you may be given additional medicines to help with side effects. Follow all directions for their use. Call your doctor or health care professional for advice if you get a fever, chills or sore throat, or other symptoms of a cold or flu. Do not treat yourself. This drug decreases your body's ability to fight infections. Try to avoid being around people who are sick. This medicine may increase your risk to bruise or bleed. Call your doctor or health care professional if you notice any unusual bleeding. Be careful brushing and flossing your teeth or using a toothpick because you may get an infection or bleed more easily. If you have any dental work done, tell your dentist you are  receiving this medicine. Avoid taking products that contain aspirin, acetaminophen, ibuprofen, naproxen, or ketoprofen unless instructed by your doctor. These medicines may hide a fever. Do not  become pregnant while taking this medicine. Women should inform their doctor if they wish to become pregnant or think they might be pregnant. There is a potential for serious side effects to an unborn child. Talk to your health care professional or pharmacist for more information. Do not breast-feed an infant while taking this medicine. Men should inform their doctor if they wish to father a child. This medicine may lower sperm counts. Do not treat diarrhea with over the counter products. Contact your doctor if you have diarrhea that lasts more than 2 days or if it is severe and watery. This medicine can make you more sensitive to the sun. Keep out of the sun. If you cannot avoid being in the sun, wear protective clothing and use sunscreen. Do not use sun lamps or tanning beds/booths. What side effects may I notice from receiving this medicine? Side effects that you should report to your doctor or health care professional as soon as possible:  allergic reactions like skin rash, itching or hives, swelling of the face, lips, or tongue  low blood counts - this medicine may decrease the number of white blood cells, red blood cells and platelets. You may be at increased risk for infections and bleeding.  signs of infection - fever or chills, cough, sore throat, pain or difficulty passing urine  signs of decreased platelets or bleeding - bruising, pinpoint red spots on the skin, black, tarry stools, blood in the urine  signs of decreased red blood cells - unusually weak or tired, fainting spells, lightheadedness  breathing problems  changes in vision  chest pain  mouth sores  nausea and vomiting  pain, swelling, redness at site where injected  pain, tingling, numbness in the hands or feet  redness, swelling, or sores on hands or feet  stomach pain  unusual bleeding Side effects that usually do not require medical attention (report to your doctor or health care professional if they  continue or are bothersome):  changes in finger or toe nails  diarrhea  dry or itchy skin  hair loss  headache  loss of appetite  sensitivity of eyes to the light  stomach upset  unusually teary eyes This list may not describe all possible side effects. Call your doctor for medical advice about side effects. You may report side effects to FDA at 1-800-FDA-1088. Where should I keep my medicine? This drug is given in a hospital or clinic and will not be stored at home. NOTE: This sheet is a summary. It may not cover all possible information. If you have questions about this medicine, talk to your doctor, pharmacist, or health care provider.  2020 Elsevier/Gold Standard (2007-04-25 13:53:16) Leucovorin injection What is this medicine? LEUCOVORIN (loo koe VOR in) is used to prevent or treat the harmful effects of some medicines. This medicine is used to treat anemia caused by a low amount of folic acid in the body. It is also used with 5-fluorouracil (5-FU) to treat colon cancer. This medicine may be used for other purposes; ask your health care provider or pharmacist if you have questions. What should I tell my health care provider before I take this medicine? They need to know if you have any of these conditions:  anemia from low levels of vitamin B-12 in the blood  an unusual or allergic reaction to  leucovorin, folic acid, other medicines, foods, dyes, or preservatives  pregnant or trying to get pregnant  breast-feeding How should I use this medicine? This medicine is for injection into a muscle or into a vein. It is given by a health care professional in a hospital or clinic setting. Talk to your pediatrician regarding the use of this medicine in children. Special care may be needed. Overdosage: If you think you have taken too much of this medicine contact a poison control center or emergency room at once. NOTE: This medicine is only for you. Do not share this medicine with  others. What if I miss a dose? This does not apply. What may interact with this medicine?  capecitabine  fluorouracil  phenobarbital  phenytoin  primidone  trimethoprim-sulfamethoxazole This list may not describe all possible interactions. Give your health care provider a list of all the medicines, herbs, non-prescription drugs, or dietary supplements you use. Also tell them if you smoke, drink alcohol, or use illegal drugs. Some items may interact with your medicine. What should I watch for while using this medicine? Your condition will be monitored carefully while you are receiving this medicine. This medicine may increase the side effects of 5-fluorouracil, 5-FU. Tell your doctor or health care professional if you have diarrhea or mouth sores that do not get better or that get worse. What side effects may I notice from receiving this medicine? Side effects that you should report to your doctor or health care professional as soon as possible:  allergic reactions like skin rash, itching or hives, swelling of the face, lips, or tongue  breathing problems  fever, infection  mouth sores  unusual bleeding or bruising  unusually weak or tired Side effects that usually do not require medical attention (report to your doctor or health care professional if they continue or are bothersome):  constipation or diarrhea  loss of appetite  nausea, vomiting This list may not describe all possible side effects. Call your doctor for medical advice about side effects. You may report side effects to FDA at 1-800-FDA-1088. Where should I keep my medicine? This drug is given in a hospital or clinic and will not be stored at home. NOTE: This sheet is a summary. It may not cover all possible information. If you have questions about this medicine, talk to your doctor, pharmacist, or health care provider.  2020 Elsevier/Gold Standard (2007-06-26 16:50:29) Irinotecan injection What is this  medicine? IRINOTECAN (ir in oh TEE kan ) is a chemotherapy drug. It is used to treat colon and rectal cancer. This medicine may be used for other purposes; ask your health care provider or pharmacist if you have questions. COMMON BRAND NAME(S): Camptosar What should I tell my health care provider before I take this medicine? They need to know if you have any of these conditions:  dehydration  diarrhea  infection (especially a virus infection such as chickenpox, cold sores, or herpes)  liver disease  low blood counts, like low white cell, platelet, or red cell counts  low levels of calcium, magnesium, or potassium in the blood  recent or ongoing radiation therapy  an unusual or allergic reaction to irinotecan, other medicines, foods, dyes, or preservatives  pregnant or trying to get pregnant  breast-feeding How should I use this medicine? This drug is given as an infusion into a vein. It is administered in a hospital or clinic by a specially trained health care professional. Talk to your pediatrician regarding the use of this medicine   in children. Special care may be needed. Overdosage: If you think you have taken too much of this medicine contact a poison control center or emergency room at once. NOTE: This medicine is only for you. Do not share this medicine with others. What if I miss a dose? It is important not to miss your dose. Call your doctor or health care professional if you are unable to keep an appointment. What may interact with this medicine? This medicine may interact with the following medications:  antiviral medicines for HIV or AIDS  certain antibiotics like rifampin or rifabutin  certain medicines for fungal infections like itraconazole, ketoconazole, posaconazole, and voriconazole  certain medicines for seizures like carbamazepine, phenobarbital, phenotoin  clarithromycin  gemfibrozil  nefazodone  St. John's Wort This list may not describe all  possible interactions. Give your health care provider a list of all the medicines, herbs, non-prescription drugs, or dietary supplements you use. Also tell them if you smoke, drink alcohol, or use illegal drugs. Some items may interact with your medicine. What should I watch for while using this medicine? Your condition will be monitored carefully while you are receiving this medicine. You will need important blood work done while you are taking this medicine. This drug may make you feel generally unwell. This is not uncommon, as chemotherapy can affect healthy cells as well as cancer cells. Report any side effects. Continue your course of treatment even though you feel ill unless your doctor tells you to stop. In some cases, you may be given additional medicines to help with side effects. Follow all directions for their use. You may get drowsy or dizzy. Do not drive, use machinery, or do anything that needs mental alertness until you know how this medicine affects you. Do not stand or sit up quickly, especially if you are an older patient. This reduces the risk of dizzy or fainting spells. Call your health care professional for advice if you get a fever, chills, or sore throat, or other symptoms of a cold or flu. Do not treat yourself. This medicine decreases your body's ability to fight infections. Try to avoid being around people who are sick. Avoid taking products that contain aspirin, acetaminophen, ibuprofen, naproxen, or ketoprofen unless instructed by your doctor. These medicines may hide a fever. This medicine may increase your risk to bruise or bleed. Call your doctor or health care professional if you notice any unusual bleeding. Be careful brushing and flossing your teeth or using a toothpick because you may get an infection or bleed more easily. If you have any dental work done, tell your dentist you are receiving this medicine. Do not become pregnant while taking this medicine or for 6 months  after stopping it. Women should inform their health care professional if they wish to become pregnant or think they might be pregnant. Men should not father a child while taking this medicine and for 3 months after stopping it. There is potential for serious side effects to an unborn child. Talk to your health care professional for more information. Do not breast-feed an infant while taking this medicine or for 7 days after stopping it. This medicine has caused ovarian failure in some women. This medicine may make it more difficult to get pregnant. Talk to your health care professional if you are concerned about your fertility. This medicine has caused decreased sperm counts in some men. This may make it more difficult to father a child. Talk to your health care professional if you   are concerned about your fertility. What side effects may I notice from receiving this medicine? Side effects that you should report to your doctor or health care professional as soon as possible:  allergic reactions like skin rash, itching or hives, swelling of the face, lips, or tongue  chest pain  diarrhea  flushing, runny nose, sweating during infusion  low blood counts - this medicine may decrease the number of white blood cells, red blood cells and platelets. You may be at increased risk for infections and bleeding.  nausea, vomiting  pain, swelling, warmth in the leg  signs of decreased platelets or bleeding - bruising, pinpoint red spots on the skin, black, tarry stools, blood in the urine  signs of infection - fever or chills, cough, sore throat, pain or difficulty passing urine  signs of decreased red blood cells - unusually weak or tired, fainting spells, lightheadedness Side effects that usually do not require medical attention (report to your doctor or health care professional if they continue or are bothersome):  constipation  hair loss  headache  loss of appetite  mouth sores  stomach  pain This list may not describe all possible side effects. Call your doctor for medical advice about side effects. You may report side effects to FDA at 1-800-FDA-1088. Where should I keep my medicine? This drug is given in a hospital or clinic and will not be stored at home. NOTE: This sheet is a summary. It may not cover all possible information. If you have questions about this medicine, talk to your doctor, pharmacist, or health care provider.  2020 Elsevier/Gold Standard (2018-02-09 10:09:17)  

## 2019-05-03 ENCOUNTER — Inpatient Hospital Stay: Payer: Medicare HMO

## 2019-05-03 ENCOUNTER — Other Ambulatory Visit: Payer: Self-pay

## 2019-05-03 VITALS — BP 133/61 | HR 71 | Temp 97.9°F | Resp 18

## 2019-05-03 DIAGNOSIS — C189 Malignant neoplasm of colon, unspecified: Secondary | ICD-10-CM

## 2019-05-03 DIAGNOSIS — C787 Secondary malignant neoplasm of liver and intrahepatic bile duct: Secondary | ICD-10-CM

## 2019-05-03 DIAGNOSIS — Z5111 Encounter for antineoplastic chemotherapy: Secondary | ICD-10-CM | POA: Diagnosis not present

## 2019-05-03 MED ORDER — COLD PACK MISC ONCOLOGY
1.0000 | Freq: Once | Status: DC | PRN
  Filled 2019-05-03: qty 1

## 2019-05-03 MED ORDER — SODIUM CHLORIDE 0.9% FLUSH
3.0000 mL | INTRAVENOUS | Status: DC | PRN
  Filled 2019-05-03: qty 10

## 2019-05-03 MED ORDER — PEGFILGRASTIM-JMDB 6 MG/0.6ML ~~LOC~~ SOSY
6.0000 mg | PREFILLED_SYRINGE | Freq: Once | SUBCUTANEOUS | Status: AC
  Administered 2019-05-03: 13:00:00 6 mg via SUBCUTANEOUS

## 2019-05-03 MED ORDER — PEGFILGRASTIM-JMDB 6 MG/0.6ML ~~LOC~~ SOSY
PREFILLED_SYRINGE | SUBCUTANEOUS | Status: AC
  Filled 2019-05-03: qty 0.6

## 2019-05-03 MED ORDER — SODIUM CHLORIDE 0.9% FLUSH
10.0000 mL | INTRAVENOUS | Status: DC | PRN
  Administered 2019-05-03: 10 mL
  Filled 2019-05-03: qty 10

## 2019-05-03 MED ORDER — HEPARIN SOD (PORK) LOCK FLUSH 100 UNIT/ML IV SOLN
500.0000 [IU] | Freq: Once | INTRAVENOUS | Status: AC | PRN
  Administered 2019-05-03: 500 [IU]
  Filled 2019-05-03: qty 5

## 2019-05-03 NOTE — Patient Instructions (Signed)

## 2019-05-10 ENCOUNTER — Other Ambulatory Visit: Payer: Self-pay | Admitting: Family

## 2019-05-10 ENCOUNTER — Telehealth: Payer: Self-pay | Admitting: *Deleted

## 2019-05-10 ENCOUNTER — Other Ambulatory Visit: Payer: Self-pay | Admitting: *Deleted

## 2019-05-10 DIAGNOSIS — C787 Secondary malignant neoplasm of liver and intrahepatic bile duct: Secondary | ICD-10-CM

## 2019-05-10 DIAGNOSIS — C189 Malignant neoplasm of colon, unspecified: Secondary | ICD-10-CM

## 2019-05-10 DIAGNOSIS — G8929 Other chronic pain: Secondary | ICD-10-CM

## 2019-05-10 NOTE — Telephone Encounter (Signed)
Call received from patient's wife stating that patient has a new lower back aching pain that started three days ago. Pt is also having difficulties getting up to ambulate d/t back pain. Pt denies any other pain, difficulty urinating, chills or fevers.  Pt is taking Advil PRN with relief of back pain.  Dr. Marin Olp notified and order placed for PET scan.  Call placed back to patient's wife to notify her of order for PET scan and to continue Advil for pain with food per Dr. Marin Olp.  Pt.'s wife appreciative of call back and has no further questions at this time.

## 2019-05-21 ENCOUNTER — Other Ambulatory Visit: Payer: Self-pay | Admitting: Hematology & Oncology

## 2019-05-22 ENCOUNTER — Inpatient Hospital Stay: Payer: Medicare HMO

## 2019-05-22 ENCOUNTER — Telehealth: Payer: Self-pay | Admitting: Hematology & Oncology

## 2019-05-22 ENCOUNTER — Encounter: Payer: Self-pay | Admitting: Family

## 2019-05-22 ENCOUNTER — Inpatient Hospital Stay (HOSPITAL_BASED_OUTPATIENT_CLINIC_OR_DEPARTMENT_OTHER): Payer: Medicare HMO | Admitting: Family

## 2019-05-22 ENCOUNTER — Telehealth: Payer: Self-pay | Admitting: Family

## 2019-05-22 ENCOUNTER — Inpatient Hospital Stay: Payer: Medicare HMO | Attending: Hematology & Oncology

## 2019-05-22 ENCOUNTER — Other Ambulatory Visit: Payer: Self-pay

## 2019-05-22 VITALS — BP 132/88 | HR 60 | Temp 96.2°F | Resp 17 | Wt 256.0 lb

## 2019-05-22 DIAGNOSIS — C189 Malignant neoplasm of colon, unspecified: Secondary | ICD-10-CM

## 2019-05-22 DIAGNOSIS — Z95828 Presence of other vascular implants and grafts: Secondary | ICD-10-CM

## 2019-05-22 DIAGNOSIS — Z5111 Encounter for antineoplastic chemotherapy: Secondary | ICD-10-CM | POA: Insufficient documentation

## 2019-05-22 DIAGNOSIS — C7800 Secondary malignant neoplasm of unspecified lung: Secondary | ICD-10-CM | POA: Diagnosis not present

## 2019-05-22 DIAGNOSIS — Z5189 Encounter for other specified aftercare: Secondary | ICD-10-CM | POA: Insufficient documentation

## 2019-05-22 DIAGNOSIS — Z79899 Other long term (current) drug therapy: Secondary | ICD-10-CM | POA: Diagnosis not present

## 2019-05-22 DIAGNOSIS — C787 Secondary malignant neoplasm of liver and intrahepatic bile duct: Secondary | ICD-10-CM | POA: Insufficient documentation

## 2019-05-22 LAB — CBC WITH DIFFERENTIAL (CANCER CENTER ONLY)
Abs Immature Granulocytes: 0.51 10*3/uL — ABNORMAL HIGH (ref 0.00–0.07)
Basophils Absolute: 0.1 10*3/uL (ref 0.0–0.1)
Basophils Relative: 1 %
Eosinophils Absolute: 0.1 10*3/uL (ref 0.0–0.5)
Eosinophils Relative: 2 %
HCT: 30.6 % — ABNORMAL LOW (ref 39.0–52.0)
Hemoglobin: 9.7 g/dL — ABNORMAL LOW (ref 13.0–17.0)
Immature Granulocytes: 7 %
Lymphocytes Relative: 15 %
Lymphs Abs: 1 10*3/uL (ref 0.7–4.0)
MCH: 31.5 pg (ref 26.0–34.0)
MCHC: 31.7 g/dL (ref 30.0–36.0)
MCV: 99.4 fL (ref 80.0–100.0)
Monocytes Absolute: 0.5 10*3/uL (ref 0.1–1.0)
Monocytes Relative: 7 %
Neutro Abs: 4.8 10*3/uL (ref 1.7–7.7)
Neutrophils Relative %: 68 %
Platelet Count: 71 10*3/uL — ABNORMAL LOW (ref 150–400)
RBC: 3.08 MIL/uL — ABNORMAL LOW (ref 4.22–5.81)
RDW: 17 % — ABNORMAL HIGH (ref 11.5–15.5)
WBC Count: 7 10*3/uL (ref 4.0–10.5)
nRBC: 1.1 % — ABNORMAL HIGH (ref 0.0–0.2)

## 2019-05-22 LAB — CMP (CANCER CENTER ONLY)
ALT: 33 U/L (ref 0–44)
AST: 35 U/L (ref 15–41)
Albumin: 3.4 g/dL — ABNORMAL LOW (ref 3.5–5.0)
Alkaline Phosphatase: 170 U/L — ABNORMAL HIGH (ref 38–126)
Anion gap: 6 (ref 5–15)
BUN: 18 mg/dL (ref 8–23)
CO2: 30 mmol/L (ref 22–32)
Calcium: 9 mg/dL (ref 8.9–10.3)
Chloride: 106 mmol/L (ref 98–111)
Creatinine: 1.1 mg/dL (ref 0.61–1.24)
GFR, Est AFR Am: >60 mL/min (ref >60)
GFR, Estimated: >60 mL/min (ref >60)
Glucose, Bld: 140 mg/dL — ABNORMAL HIGH (ref 70–99)
Potassium: 3.8 mmol/L (ref 3.5–5.1)
Sodium: 142 mmol/L (ref 135–145)
Total Bilirubin: 0.9 mg/dL (ref 0.3–1.2)
Total Protein: 5.6 g/dL — ABNORMAL LOW (ref 6.5–8.1)

## 2019-05-22 LAB — CEA (IN HOUSE-CHCC): CEA (CHCC-In House): 35.62 ng/mL — ABNORMAL HIGH (ref 0.00–5.00)

## 2019-05-22 MED ORDER — HEPARIN SOD (PORK) LOCK FLUSH 100 UNIT/ML IV SOLN
500.0000 [IU] | Freq: Once | INTRAVENOUS | Status: AC
  Administered 2019-05-22: 500 [IU] via INTRAVENOUS
  Filled 2019-05-22: qty 5

## 2019-05-22 MED ORDER — SODIUM CHLORIDE 0.9% FLUSH
10.0000 mL | Freq: Once | INTRAVENOUS | Status: AC
  Administered 2019-05-22: 10 mL via INTRAVENOUS
  Filled 2019-05-22: qty 10

## 2019-05-22 NOTE — Telephone Encounter (Signed)
Appointments rescheduled for treatment due to low platelets / ok per Dr Marin Olp text .  Treatment was moved out by one week per 5/19 los

## 2019-05-22 NOTE — Telephone Encounter (Signed)
Called spoke with patient regarding appointment date/time changes per  5/19 los

## 2019-05-22 NOTE — Progress Notes (Signed)
Hematology and Oncology Follow Up Visit  Zachary Willis [id number] 13-Jun-1945 74 y.o. 05/22/2019   Principle Diagnosis:  Metastatic colon cancer - progressive  Past Therapy: Status post RFA to the liver-11/21/2017  S/P Y-90 x 2 -- last therapy on 08/27/2018  Current Therapy: FOLFIRI -- started on 01/09/2019, s/p cycle 7   Interim History:  Zachary Willis is here today for follow-up and treatment. He is doing well but noted a little more fatigue and back aches and pains after his last cycle.  He did get Pegfilgrastim with his last treatment.  CEA 3 weeks ago was up to 144.  Platelets today are down at 71. No episodes of bleeding. He does have a few bruises. No petechiae.  No fever chills, n/v, cough, rash, dizziness, SOB, chest pain, palpitations, abdominal pain or changes in bowel or bladder habits.  He has occasional numbness and tingling in his toes.  The swelling in his lower extremities waxes and wanes. It does improve over night.  No falls or syncopal episodes to report.  He is eating well and staying properly hydrated while taking his diuretic as prescribed. His weight is stable.   ECOG Performance Status: 1 - Symptomatic but completely ambulatory  Medications:  Allergies as of 05/22/2019   No Known Allergies     Medication List       Accurate as of May 22, 2019  9:43 AM. If you have any questions, ask your nurse or doctor.        acyclovir 400 MG tablet Commonly known as: ZOVIRAX TAKE 1 TABLET BY MOUTH TWICE A DAY   allopurinol 300 MG tablet Commonly known as: ZYLOPRIM Take 300 mg by mouth every morning.   atorvastatin 40 MG tablet Commonly known as: LIPITOR Take 40 mg by mouth daily.   carvedilol 6.25 MG tablet Commonly known as: COREG TAKE 1 TABLET (6.25 MG TOTAL) BY MOUTH 2 TIMES DAILY WITH MEALS.   [facility]S Aspirin Adult Low Dose 81 MG chewable tablet Generic drug: aspirin Chew 81 mg by mouth daily.   [facility] Stool Softener/Laxative 8.6-50 MG tablet  Generic drug: senna-docusate TAKE 2 TABLETS EVERY EVENING   dexamethasone 4 MG tablet Commonly known as: DECADRON Take 2 tablets (8 mg total) by mouth daily. Start the day after chemo for 2 days.   ferrous sulfate 325 (65 FE) MG tablet Take 325 mg by mouth daily.   furosemide 20 MG tablet Commonly known as: LASIX Take 20 mg by mouth daily.   hydrocortisone 10 MG tablet Commonly known as: CORTEF Take 5-15 mg by mouth See admin instructions. Takes 15 mg in am and 5mg  in afternoon.   Klor-Con 10 10 MEQ tablet Generic drug: potassium chloride Take 20 mEq by mouth daily.   levothyroxine 88 MCG tablet Commonly known as: SYNTHROID Take 88 mcg by mouth daily before breakfast.   lidocaine-prilocaine cream Commonly known as: EMLA Apply to affected area once   loperamide 2 MG capsule Commonly known as: IMODIUM Take 2 mg by mouth as needed for diarrhea or loose stools.   loperamide 2 MG tablet Commonly known as: Imodium A-D Take 1 tablet (2 mg total) by mouth 4 (four) times daily as needed. Take 2 at diarrhea onset , then 1 every 2hr until 12hrs with no BM. May take 2 every 4hrs at night. If diarrhea recurs repeat.   LORazepam 1 MG tablet Commonly known as: Ativan Take 1 tablet (1 mg total) by mouth every 6 (six) hours as needed (NAUSEA).  NASAL [place] NA Place 1 spray into the nose daily.   ondansetron 8 MG tablet Commonly known as: ZOFRAN Take 1 tablet (8 mg total) by mouth 2 (two) times daily as needed for refractory nausea / vomiting. Start on day 3 after chemotherapy.   pantoprazole 40 MG tablet Commonly known as: PROTONIX Take 40 mg by mouth every morning.   polyvinyl alcohol 1.4 % ophthalmic solution Commonly known as: LIQUIFILM TEARS Place 1 drop into both eyes daily as needed for dry eyes.   prochlorperazine 10 MG tablet Commonly known as: COMPAZINE Take 1 tablet (10 mg total) by mouth every 6 (six) hours as needed (NAUSEA).   testosterone 4 MG/24HR Pt24  patch Commonly known as: ANDRODERM Place 1 patch onto the skin daily.   warfarin 5 MG tablet Commonly known as: COUMADIN Take 5 mg by mouth daily.       Allergies: No Known Allergies  Past Medical History, Surgical history, Social history, and Family History were reviewed and updated.  Review of Systems: All other 10 point review of systems is negative.   Physical Exam:  weight is 256 lb (116.1 kg). His temporal temperature is 96.2 F (35.7 C) (abnormal). His blood pressure is 132/88 and his pulse is 60. His respiration is 17 and oxygen saturation is 100%.   Wt Readings from Last 3 Encounters:  05/22/19 256 lb (116.1 kg)  05/01/19 251 lb (113.9 kg)  04/10/19 253 lb 1.6 oz (114.8 kg)    Ocular: Sclerae unicteric, pupils equal, round and reactive to light Ear-nose-throat: Oropharynx clear, dentition fair Lymphatic: No cervical or supraclavicular adenopathy Lungs no rales or rhonchi, good excursion bilaterally Heart regular rate and rhythm, no murmur appreciated Abd soft, nontender, positive bowel sounds, no liver or spleen tip palpated on exam, no fluid wave  MSK no focal spinal tenderness, no joint edema Neuro: non-focal, well-oriented, appropriate affect Breasts: Deferred   Lab Results  Component Value Date   WBC 7.0 05/22/2019   HGB 9.7 (L) 05/22/2019   HCT 30.6 (L) 05/22/2019   MCV 99.4 05/22/2019   PLT 71 (L) 05/22/2019   Lab Results  Component Value Date   FERRITIN 204 05/01/2019   IRON 55 05/01/2019   TIBC 225 05/01/2019   UIBC 170 05/01/2019   IRONPCTSAT 24 05/01/2019   Lab Results  Component Value Date   RBC 3.08 (L) 05/22/2019   No results found for: KPAFRELGTCHN, LAMBDASER, KAPLAMBRATIO No results found for: IGGSERUM, IGA, IGMSERUM No results found for: Ronnald Ramp, A1GS, A2GS, Tillman Sers, SPEI   Chemistry      Component Value Date/Time   NA 143 05/01/2019 0909   K 3.8 05/01/2019 0909   CL 106 05/01/2019 0909    CO2 30 05/01/2019 0909   BUN 21 05/01/2019 0909   CREATININE 1.27 (H) 05/01/2019 0909      Component Value Date/Time   CALCIUM 9.1 05/01/2019 0909   ALKPHOS 164 (H) 05/01/2019 0909   AST 37 05/01/2019 0909   ALT 26 05/01/2019 0909   BILITOT 1.2 05/01/2019 0909       Impression and Plan: Zachary Willis is a very pleasant 74 yo caucasian gentleman with metastatic colon cancer.  Due to the low platelet count we will hold treatment this week and bring him back next week to re-evaluate.  He is scheduled for a PET scan next week on Monday. He will contact our office with any questions or concerns. We can certainly see him sooner if needed.  Laverna Peace, NP 5/19/20219:43 AM

## 2019-05-22 NOTE — Addendum Note (Signed)
Addended by: Shelda Altes on: 05/22/2019 10:22 AM   Modules accepted: Orders

## 2019-05-23 ENCOUNTER — Telehealth: Payer: Self-pay

## 2019-05-23 ENCOUNTER — Other Ambulatory Visit: Payer: Self-pay | Admitting: Family

## 2019-05-23 DIAGNOSIS — C787 Secondary malignant neoplasm of liver and intrahepatic bile duct: Secondary | ICD-10-CM

## 2019-05-23 DIAGNOSIS — C189 Malignant neoplasm of colon, unspecified: Secondary | ICD-10-CM

## 2019-05-23 NOTE — Telephone Encounter (Signed)
Call pt. to inform him that his CEA marker is down to 35. He verbalized gratitude for the phone call.

## 2019-05-23 NOTE — Telephone Encounter (Signed)
-----   Message from [name], MD sent at 05/23/2019  9:54 AM EDT ----- Call - the CEA marker is down to 35!!!  This is wonderful!!!  Zachary Willis

## 2019-05-24 ENCOUNTER — Inpatient Hospital Stay: Payer: Medicare HMO

## 2019-05-24 NOTE — Progress Notes (Signed)
Pharmacist Chemotherapy Monitoring - Follow Up Assessment    I verify that I have reviewed each item in the below checklist:  . Regimen for the patient is scheduled for the appropriate day and plan matches scheduled date. Marland Kitchen Appropriate non-routine labs are ordered dependent on drug ordered. . If applicable, additional medications reviewed and ordered per protocol based on lifetime cumulative doses and/or treatment regimen.   Plan for follow-up and/or issues identified: No . I-vent associated with next due treatment: No . MD and/or nursing notified: No  Brunetta Newingham, [name] 05/24/2019 3:01 PM

## 2019-05-27 ENCOUNTER — Ambulatory Visit (HOSPITAL_COMMUNITY): Payer: Medicare HMO

## 2019-05-28 ENCOUNTER — Inpatient Hospital Stay: Payer: Medicare HMO

## 2019-05-28 ENCOUNTER — Ambulatory Visit (HOSPITAL_BASED_OUTPATIENT_CLINIC_OR_DEPARTMENT_OTHER)
Admission: RE | Admit: 2019-05-28 | Discharge: 2019-05-28 | Disposition: A | Discharge status: Home / Self Care | Payer: Medicare HMO | Source: Ambulatory Visit | Attending: Family | Admitting: Family

## 2019-05-28 ENCOUNTER — Encounter: Payer: Self-pay | Admitting: Hematology & Oncology

## 2019-05-28 ENCOUNTER — Other Ambulatory Visit: Payer: Medicare HMO

## 2019-05-28 ENCOUNTER — Other Ambulatory Visit: Payer: Self-pay

## 2019-05-28 ENCOUNTER — Inpatient Hospital Stay (HOSPITAL_BASED_OUTPATIENT_CLINIC_OR_DEPARTMENT_OTHER): Payer: Medicare HMO | Admitting: Hematology & Oncology

## 2019-05-28 ENCOUNTER — Encounter (HOSPITAL_BASED_OUTPATIENT_CLINIC_OR_DEPARTMENT_OTHER): Payer: Self-pay

## 2019-05-28 VITALS — BP 150/85 | HR 76 | Temp 96.6°F | Resp 18

## 2019-05-28 VITALS — Wt 257.0 lb

## 2019-05-28 DIAGNOSIS — C189 Malignant neoplasm of colon, unspecified: Secondary | ICD-10-CM

## 2019-05-28 DIAGNOSIS — C787 Secondary malignant neoplasm of liver and intrahepatic bile duct: Secondary | ICD-10-CM

## 2019-05-28 DIAGNOSIS — Z5111 Encounter for antineoplastic chemotherapy: Secondary | ICD-10-CM | POA: Diagnosis not present

## 2019-05-28 HISTORY — DX: Disorder of kidney and ureter, unspecified: N28.9

## 2019-05-28 HISTORY — DX: Essential (primary) hypertension: I10

## 2019-05-28 LAB — CBC WITH DIFFERENTIAL (CANCER CENTER ONLY)
Abs Immature Granulocytes: 0.39 10*3/uL — ABNORMAL HIGH (ref 0.00–0.07)
Basophils Absolute: 0.1 10*3/uL (ref 0.0–0.1)
Basophils Relative: 1 %
Eosinophils Absolute: 0.1 10*3/uL (ref 0.0–0.5)
Eosinophils Relative: 1 %
HCT: 31.5 % — ABNORMAL LOW (ref 39.0–52.0)
Hemoglobin: 9.8 g/dL — ABNORMAL LOW (ref 13.0–17.0)
Immature Granulocytes: 6 %
Lymphocytes Relative: 13 %
Lymphs Abs: 0.9 10*3/uL (ref 0.7–4.0)
MCH: 31.6 pg (ref 26.0–34.0)
MCHC: 31.1 g/dL (ref 30.0–36.0)
MCV: 101.6 fL — ABNORMAL HIGH (ref 80.0–100.0)
Monocytes Absolute: 0.6 10*3/uL (ref 0.1–1.0)
Monocytes Relative: 8 %
Neutro Abs: 5 10*3/uL (ref 1.7–7.7)
Neutrophils Relative %: 71 %
Platelet Count: 92 10*3/uL — ABNORMAL LOW (ref 150–400)
RBC: 3.1 MIL/uL — ABNORMAL LOW (ref 4.22–5.81)
RDW: 17.8 % — ABNORMAL HIGH (ref 11.5–15.5)
WBC Count: 7 10*3/uL (ref 4.0–10.5)
nRBC: 0.6 % — ABNORMAL HIGH (ref 0.0–0.2)

## 2019-05-28 LAB — CMP (CANCER CENTER ONLY)
ALT: 26 U/L (ref 0–44)
AST: 35 U/L (ref 15–41)
Albumin: 3.5 g/dL (ref 3.5–5.0)
Alkaline Phosphatase: 153 U/L — ABNORMAL HIGH (ref 38–126)
Anion gap: 6 (ref 5–15)
BUN: 19 mg/dL (ref 8–23)
CO2: 29 mmol/L (ref 22–32)
Calcium: 9 mg/dL (ref 8.9–10.3)
Chloride: 106 mmol/L (ref 98–111)
Creatinine: 1.19 mg/dL (ref 0.61–1.24)
GFR, Est AFR Am: >60 mL/min (ref >60)
GFR, Estimated: >60 mL/min (ref >60)
Glucose, Bld: 133 mg/dL — ABNORMAL HIGH (ref 70–99)
Potassium: 4.2 mmol/L (ref 3.5–5.1)
Sodium: 141 mmol/L (ref 135–145)
Total Bilirubin: 1.1 mg/dL (ref 0.3–1.2)
Total Protein: 5.9 g/dL — ABNORMAL LOW (ref 6.5–8.1)

## 2019-05-28 MED ORDER — SODIUM CHLORIDE 0.9 % IV SOLN
10.0000 mg | Freq: Once | INTRAVENOUS | Status: AC
  Administered 2019-05-28: 10 mg via INTRAVENOUS
  Filled 2019-05-28: qty 10

## 2019-05-28 MED ORDER — SODIUM CHLORIDE 0.9 % IV SOLN
400.0000 mg/m2 | Freq: Once | INTRAVENOUS | Status: AC
  Administered 2019-05-28: 936 mg via INTRAVENOUS
  Filled 2019-05-28: qty 46.8

## 2019-05-28 MED ORDER — SODIUM CHLORIDE 0.9 % IV SOLN
2140.0000 mg/m2 | INTRAVENOUS | Status: DC
  Administered 2019-05-28: 5000 mg via INTRAVENOUS
  Filled 2019-05-28: qty 100

## 2019-05-28 MED ORDER — FLUOROURACIL CHEMO INJECTION 2.5 GM/50ML
300.0000 mg/m2 | Freq: Once | INTRAVENOUS | Status: AC
  Administered 2019-05-28: 700 mg via INTRAVENOUS
  Filled 2019-05-28: qty 14

## 2019-05-28 MED ORDER — PALONOSETRON HCL INJECTION 0.25 MG/5ML
INTRAVENOUS | Status: AC
  Filled 2019-05-28: qty 5

## 2019-05-28 MED ORDER — SODIUM CHLORIDE 0.9 % IV SOLN
Freq: Once | INTRAVENOUS | Status: AC
  Filled 2019-05-28: qty 250

## 2019-05-28 MED ORDER — STERILE WATER FOR INJECTION IJ SOLN
INTRAMUSCULAR | Status: AC
  Filled 2019-05-28: qty 10

## 2019-05-28 MED ORDER — IOHEXOL 300 MG/ML  SOLN
100.0000 mL | Freq: Once | INTRAMUSCULAR | Status: AC | PRN
  Administered 2019-05-28: 100 mL via INTRAVENOUS

## 2019-05-28 MED ORDER — PALONOSETRON HCL INJECTION 0.25 MG/5ML
0.2500 mg | Freq: Once | INTRAVENOUS | Status: AC
  Administered 2019-05-28: 0.25 mg via INTRAVENOUS

## 2019-05-28 MED ORDER — ALTEPLASE 2 MG IJ SOLR
2.0000 mg | Freq: Once | INTRAMUSCULAR | Status: AC | PRN
  Administered 2019-05-28: 2 mg
  Filled 2019-05-28: qty 2

## 2019-05-28 MED ORDER — ATROPINE SULFATE 1 MG/ML IJ SOLN
INTRAMUSCULAR | Status: AC
  Filled 2019-05-28: qty 1

## 2019-05-28 MED ORDER — ATROPINE SULFATE 1 MG/ML IJ SOLN
0.5000 mg | Freq: Once | INTRAMUSCULAR | Status: AC | PRN
  Administered 2019-05-28: 0.5 mg via INTRAVENOUS

## 2019-05-28 MED ORDER — ALTEPLASE 2 MG IJ SOLR
INTRAMUSCULAR | Status: AC
  Filled 2019-05-28: qty 2

## 2019-05-28 MED ORDER — SODIUM CHLORIDE 0.9 % IV SOLN
144.0000 mg/m2 | Freq: Once | INTRAVENOUS | Status: AC
  Administered 2019-05-28: 340 mg via INTRAVENOUS
  Filled 2019-05-28: qty 15

## 2019-05-28 NOTE — Progress Notes (Signed)
Hematology and Oncology Follow Up Visit  Zachary Willis [id number] Dec 04, 1945 74 y.o. 05/28/2019   Principle Diagnosis:  Metastatic colon cancer - progressive  Past Therapy: Status post RFA to the liver-11/21/2017  S/P Y-90 x 2 -- last therapy on 08/27/2018  Current Therapy: FOLFIRI -- started on 01/09/2019, s/p cycle #7   Interim History:  Zachary Willis is here today for follow-up and treatment.  We had him hold his treatment last week because of his platelet count being low.  Had to make a slight adjustment with his chemotherapy protocol so that his platelet count does not drop as bad.  Surprisingly, his last CEA was down to 35.  I am not sure if this truly is correct.  His past CEA was 144..  We did go ahead and did a CT scan of his body today.  This, thankfully, did not show any evidence of progressive disease.  His liver metastasis were little bit better.  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  He has had no problems with his heart.  There has been no chest pain.  There is no cough or shortness of breath.  He has had no rashes.  There has been no bleeding.  He has had no headache.  Overall, his performance status is ECOG 1.  Medications:  Allergies as of 05/28/2019   No Known Allergies     Medication List       Accurate as of May 28, 2019  1:24 PM. If you have any questions, ask your nurse or doctor.        acyclovir 400 MG tablet Commonly known as: ZOVIRAX TAKE 1 TABLET BY MOUTH TWICE A DAY   allopurinol 300 MG tablet Commonly known as: ZYLOPRIM Take 300 mg by mouth every morning.   atorvastatin 40 MG tablet Commonly known as: LIPITOR Take 40 mg by mouth daily.   carvedilol 6.25 MG tablet Commonly known as: COREG TAKE 1 TABLET (6.25 MG TOTAL) BY MOUTH 2 TIMES DAILY WITH MEALS.   [facility]S Aspirin Adult Low Dose 81 MG chewable tablet Generic drug: aspirin Chew 81 mg by mouth daily.   [facility] Stool Softener/Laxative 8.6-50 MG tablet Generic drug:  senna-docusate TAKE 2 TABLETS EVERY EVENING   dexamethasone 4 MG tablet Commonly known as: DECADRON Take 2 tablets (8 mg total) by mouth daily. Start the day after chemo for 2 days.   ferrous sulfate 325 (65 FE) MG tablet Take 325 mg by mouth daily.   furosemide 20 MG tablet Commonly known as: LASIX Take 20 mg by mouth daily.   hydrocortisone 10 MG tablet Commonly known as: CORTEF Take 5-15 mg by mouth See admin instructions. Takes 15 mg in am and 5mg  in afternoon.   Klor-Con 10 10 MEQ tablet Generic drug: potassium chloride Take 20 mEq by mouth daily.   levothyroxine 88 MCG tablet Commonly known as: SYNTHROID Take 88 mcg by mouth daily before breakfast.   lidocaine-prilocaine cream Commonly known as: EMLA Apply to affected area once   loperamide 2 MG capsule Commonly known as: IMODIUM Take 2 mg by mouth as needed for diarrhea or loose stools.   loperamide 2 MG tablet Commonly known as: Imodium A-D Take 1 tablet (2 mg total) by mouth 4 (four) times daily as needed. Take 2 at diarrhea onset , then 1 every 2hr until 12hrs with no BM. May take 2 every 4hrs at night. If diarrhea recurs repeat.   LORazepam 1 MG tablet Commonly known as: Ativan Take  1 tablet (1 mg total) by mouth every 6 (six) hours as needed (NAUSEA).   NASAL [place] NA Place 1 spray into the nose daily.   ondansetron 8 MG tablet Commonly known as: ZOFRAN Take 1 tablet (8 mg total) by mouth 2 (two) times daily as needed for refractory nausea / vomiting. Start on day 3 after chemotherapy.   pantoprazole 40 MG tablet Commonly known as: PROTONIX Take 40 mg by mouth every morning.   polyvinyl alcohol 1.4 % ophthalmic solution Commonly known as: LIQUIFILM TEARS Place 1 drop into both eyes daily as needed for dry eyes.   prochlorperazine 10 MG tablet Commonly known as: COMPAZINE Take 1 tablet (10 mg total) by mouth every 6 (six) hours as needed (NAUSEA).   testosterone 4 MG/24HR Pt24 patch Commonly  known as: ANDRODERM Place 1 patch onto the skin daily.   warfarin 5 MG tablet Commonly known as: COUMADIN Take 5 mg by mouth daily.       Allergies: No Known Allergies  Past Medical History, Surgical history, Social history, and Family History were reviewed and updated.  Review of Systems: Review of Systems  Constitutional: Negative.   HENT: Negative.   Eyes: Negative.   Respiratory: Negative.   Cardiovascular: Negative.   Gastrointestinal: Negative.   Genitourinary: Negative.   Musculoskeletal: Negative.   Skin: Negative.   Neurological: Negative.   Endo/Heme/Allergies: Negative.   Psychiatric/Behavioral: Negative.      Physical Exam:  weight is 257 lb (116.6 kg).   Wt Readings from Last 3 Encounters:  05/28/19 257 lb (116.6 kg)  05/22/19 256 lb (116.1 kg)  05/01/19 251 lb (113.9 kg)    Physical Exam Vitals reviewed.  HENT:     Head: Normocephalic and atraumatic.  Eyes:     Pupils: Pupils are equal, round, and reactive to light.  Cardiovascular:     Rate and Rhythm: Normal rate and regular rhythm.     Heart sounds: Normal heart sounds.  Pulmonary:     Effort: Pulmonary effort is normal.     Breath sounds: Normal breath sounds.  Abdominal:     General: Bowel sounds are normal.     Palpations: Abdomen is soft.     Comments: Abdominal exam shows a soft abdomen.  Has multiple laparotomy scars.  There is no fluid wave.  There is no guarding or rebound tenderness.  There is no palpable liver or spleen tip.  Musculoskeletal:        General: No tenderness or deformity. Normal range of motion.     Cervical back: Normal range of motion.  Lymphadenopathy:     Cervical: No cervical adenopathy.  Skin:    General: Skin is warm and dry.     Findings: No erythema or rash.  Neurological:     Mental Status: He is alert and oriented to person, place, and time.  Psychiatric:        Behavior: Behavior normal.        Thought Content: Thought content normal.         Judgment: Judgment normal.      Lab Results  Component Value Date   WBC 7.0 05/28/2019   HGB 9.8 (L) 05/28/2019   HCT 31.5 (L) 05/28/2019   MCV 101.6 (H) 05/28/2019   PLT 92 (L) 05/28/2019   Lab Results  Component Value Date   FERRITIN 204 05/01/2019   IRON 55 05/01/2019   TIBC 225 05/01/2019   UIBC 170 05/01/2019   IRONPCTSAT 24 05/01/2019  Lab Results  Component Value Date   RBC 3.10 (L) 05/28/2019   No results found for: KPAFRELGTCHN, LAMBDASER, KAPLAMBRATIO No results found for: IGGSERUM, IGA, IGMSERUM No results found for: Odetta Pink, SPEI   Chemistry      Component Value Date/Time   NA 141 05/28/2019 1019   K 4.2 05/28/2019 1019   CL 106 05/28/2019 1019   CO2 29 05/28/2019 1019   BUN 19 05/28/2019 1019   CREATININE 1.19 05/28/2019 1019      Component Value Date/Time   CALCIUM 9.0 05/28/2019 1019   ALKPHOS 153 (H) 05/28/2019 1019   AST 35 05/28/2019 1019   ALT 26 05/28/2019 1019   BILITOT 1.1 05/28/2019 1019       Impression and Plan: Mr. Dezeeuw is a very pleasant 74 yo caucasian gentleman with metastatic colon cancer.   I am just happy that he seemed to be responding.  The CAT scan is certainly encouraging for this.  Again, we will make an adjustment with his chemotherapy protocol.  We will go ahead and treat today.  I think he needs 3 weeks between treatments.  We will continue him on the 3-week protocol. Marland Kitchen   [name], MD 5/25/20211:24 PM

## 2019-05-28 NOTE — Patient Instructions (Signed)
Leucovorin injection What is this medicine? LEUCOVORIN (loo koe VOR in) is used to prevent or treat the harmful effects of some medicines. This medicine is used to treat anemia caused by a low amount of folic acid in the body. It is also used with 5-fluorouracil (5-FU) to treat colon cancer. This medicine may be used for other purposes; ask your health care provider or pharmacist if you have questions. What should I tell my health care provider before I take this medicine? They need to know if you have any of these conditions:  anemia from low levels of vitamin B-12 in the blood  an unusual or allergic reaction to leucovorin, folic acid, other medicines, foods, dyes, or preservatives  pregnant or trying to get pregnant  breast-feeding How should I use this medicine? This medicine is for injection into a muscle or into a vein. It is given by a health care professional in a hospital or clinic setting. Talk to your pediatrician regarding the use of this medicine in children. Special care may be needed. Overdosage: If you think you have taken too much of this medicine contact a poison control center or emergency room at once. NOTE: This medicine is only for you. Do not share this medicine with others. What if I miss a dose? This does not apply. What may interact with this medicine?  capecitabine  fluorouracil  phenobarbital  phenytoin  primidone  trimethoprim-sulfamethoxazole This list may not describe all possible interactions. Give your health care provider a list of all the medicines, herbs, non-prescription drugs, or dietary supplements you use. Also tell them if you smoke, drink alcohol, or use illegal drugs. Some items may interact with your medicine. What should I watch for while using this medicine? Your condition will be monitored carefully while you are receiving this medicine. This medicine may increase the side effects of 5-fluorouracil, 5-FU. Tell your doctor or health  care professional if you have diarrhea or mouth sores that do not get better or that get worse. What side effects may I notice from receiving this medicine? Side effects that you should report to your doctor or health care professional as soon as possible:  allergic reactions like skin rash, itching or hives, swelling of the face, lips, or tongue  breathing problems  fever, infection  mouth sores  unusual bleeding or bruising  unusually weak or tired Side effects that usually do not require medical attention (report to your doctor or health care professional if they continue or are bothersome):  constipation or diarrhea  loss of appetite  nausea, vomiting This list may not describe all possible side effects. Call your doctor for medical advice about side effects. You may report side effects to FDA at 1-800-FDA-1088. Where should I keep my medicine? This drug is given in a hospital or clinic and will not be stored at home. NOTE: This sheet is a summary. It may not cover all possible information. If you have questions about this medicine, talk to your doctor, pharmacist, or health care provider.  2020 Elsevier/Gold Standard (2007-06-26 16:50:29) Irinotecan injection What is this medicine? IRINOTECAN (ir in oh TEE kan ) is a chemotherapy drug. It is used to treat colon and rectal cancer. This medicine may be used for other purposes; ask your health care provider or pharmacist if you have questions. COMMON BRAND NAME(S): Camptosar What should I tell my health care provider before I take this medicine? They need to know if you have any of these conditions:  dehydration  diarrhea  infection (especially a virus infection such as chickenpox, cold sores, or herpes)  liver disease  low blood counts, like low white cell, platelet, or red cell counts  low levels of calcium, magnesium, or potassium in the blood  recent or ongoing radiation therapy  an unusual or allergic reaction  to irinotecan, other medicines, foods, dyes, or preservatives  pregnant or trying to get pregnant  breast-feeding How should I use this medicine? This drug is given as an infusion into a vein. It is administered in a hospital or clinic by a specially trained health care professional. Talk to your pediatrician regarding the use of this medicine in children. Special care may be needed. Overdosage: If you think you have taken too much of this medicine contact a poison control center or emergency room at once. NOTE: This medicine is only for you. Do not share this medicine with others. What if I miss a dose? It is important not to miss your dose. Call your doctor or health care professional if you are unable to keep an appointment. What may interact with this medicine? This medicine may interact with the following medications:  antiviral medicines for HIV or AIDS  certain antibiotics like rifampin or rifabutin  certain medicines for fungal infections like itraconazole, ketoconazole, posaconazole, and voriconazole  certain medicines for seizures like carbamazepine, phenobarbital, phenotoin  clarithromycin  gemfibrozil  nefazodone  St. John's Wort This list may not describe all possible interactions. Give your health care provider a list of all the medicines, herbs, non-prescription drugs, or dietary supplements you use. Also tell them if you smoke, drink alcohol, or use illegal drugs. Some items may interact with your medicine. What should I watch for while using this medicine? Your condition will be monitored carefully while you are receiving this medicine. You will need important blood work done while you are taking this medicine. This drug may make you feel generally unwell. This is not uncommon, as chemotherapy can affect healthy cells as well as cancer cells. Report any side effects. Continue your course of treatment even though you feel ill unless your doctor tells you to stop. In  some cases, you may be given additional medicines to help with side effects. Follow all directions for their use. You may get drowsy or dizzy. Do not drive, use machinery, or do anything that needs mental alertness until you know how this medicine affects you. Do not stand or sit up quickly, especially if you are an older patient. This reduces the risk of dizzy or fainting spells. Call your health care professional for advice if you get a fever, chills, or sore throat, or other symptoms of a cold or flu. Do not treat yourself. This medicine decreases your body's ability to fight infections. Try to avoid being around people who are sick. Avoid taking products that contain aspirin, acetaminophen, ibuprofen, naproxen, or ketoprofen unless instructed by your doctor. These medicines may hide a fever. This medicine may increase your risk to bruise or bleed. Call your doctor or health care professional if you notice any unusual bleeding. Be careful brushing and flossing your teeth or using a toothpick because you may get an infection or bleed more easily. If you have any dental work done, tell your dentist you are receiving this medicine. Do not become pregnant while taking this medicine or for 6 months after stopping it. Women should inform their health care professional if they wish to become pregnant or think they might be pregnant. Men should  not father a child while taking this medicine and for 3 months after stopping it. There is potential for serious side effects to an unborn child. Talk to your health care professional for more information. Do not breast-feed an infant while taking this medicine or for 7 days after stopping it. This medicine has caused ovarian failure in some women. This medicine may make it more difficult to get pregnant. Talk to your health care professional if you are concerned about your fertility. This medicine has caused decreased sperm counts in some men. This may make it more  difficult to father a child. Talk to your health care professional if you are concerned about your fertility. What side effects may I notice from receiving this medicine? Side effects that you should report to your doctor or health care professional as soon as possible:  allergic reactions like skin rash, itching or hives, swelling of the face, lips, or tongue  chest pain  diarrhea  flushing, runny nose, sweating during infusion  low blood counts - this medicine may decrease the number of white blood cells, red blood cells and platelets. You may be at increased risk for infections and bleeding.  nausea, vomiting  pain, swelling, warmth in the leg  signs of decreased platelets or bleeding - bruising, pinpoint red spots on the skin, black, tarry stools, blood in the urine  signs of infection - fever or chills, cough, sore throat, pain or difficulty passing urine  signs of decreased red blood cells - unusually weak or tired, fainting spells, lightheadedness Side effects that usually do not require medical attention (report to your doctor or health care professional if they continue or are bothersome):  constipation  hair loss  headache  loss of appetite  mouth sores  stomach pain This list may not describe all possible side effects. Call your doctor for medical advice about side effects. You may report side effects to FDA at 1-800-FDA-1088. Where should I keep my medicine? This drug is given in a hospital or clinic and will not be stored at home. NOTE: This sheet is a summary. It may not cover all possible information. If you have questions about this medicine, talk to your doctor, pharmacist, or health care provider.  2020 Elsevier/Gold Standard (2018-02-09 10:09:17) Fluorouracil, 5-FU injection What is this medicine? FLUOROURACIL, 5-FU (flure oh YOOR a sil) is a chemotherapy drug. It slows the growth of cancer cells. This medicine is used to treat many types of cancer like  breast cancer, colon or rectal cancer, pancreatic cancer, and stomach cancer. This medicine may be used for other purposes; ask your health care provider or pharmacist if you have questions. COMMON BRAND NAME(S): Adrucil What should I tell my health care provider before I take this medicine? They need to know if you have any of these conditions:  blood disorders  dihydropyrimidine dehydrogenase (DPD) deficiency  infection (especially a virus infection such as chickenpox, cold sores, or herpes)  kidney disease  liver disease  malnourished, poor nutrition  recent or ongoing radiation therapy  an unusual or allergic reaction to fluorouracil, other chemotherapy, other medicines, foods, dyes, or preservatives  pregnant or trying to get pregnant  breast-feeding How should I use this medicine? This drug is given as an infusion or injection into a vein. It is administered in a hospital or clinic by a specially trained health care professional. Talk to your pediatrician regarding the use of this medicine in children. Special care may be needed. Overdosage: If you think  you have taken too much of this medicine contact a poison control center or emergency room at once. NOTE: This medicine is only for you. Do not share this medicine with others. What if I miss a dose? It is important not to miss your dose. Call your doctor or health care professional if you are unable to keep an appointment. What may interact with this medicine?  allopurinol  cimetidine  dapsone  digoxin  hydroxyurea  leucovorin  levamisole  medicines for seizures like ethotoin, fosphenytoin, phenytoin  medicines to increase blood counts like filgrastim, pegfilgrastim, sargramostim  medicines that treat or prevent blood clots like warfarin, enoxaparin, and dalteparin  methotrexate  metronidazole  pyrimethamine  some other chemotherapy drugs like busulfan, cisplatin, estramustine,  vinblastine  trimethoprim  trimetrexate  vaccines Talk to your doctor or health care professional before taking any of these medicines:  acetaminophen  aspirin  ibuprofen  ketoprofen  naproxen This list may not describe all possible interactions. Give your health care provider a list of all the medicines, herbs, non-prescription drugs, or dietary supplements you use. Also tell them if you smoke, drink alcohol, or use illegal drugs. Some items may interact with your medicine. What should I watch for while using this medicine? Visit your doctor for checks on your progress. This drug may make you feel generally unwell. This is not uncommon, as chemotherapy can affect healthy cells as well as cancer cells. Report any side effects. Continue your course of treatment even though you feel ill unless your doctor tells you to stop. In some cases, you may be given additional medicines to help with side effects. Follow all directions for their use. Call your doctor or health care professional for advice if you get a fever, chills or sore throat, or other symptoms of a cold or flu. Do not treat yourself. This drug decreases your body's ability to fight infections. Try to avoid being around people who are sick. This medicine may increase your risk to bruise or bleed. Call your doctor or health care professional if you notice any unusual bleeding. Be careful brushing and flossing your teeth or using a toothpick because you may get an infection or bleed more easily. If you have any dental work done, tell your dentist you are receiving this medicine. Avoid taking products that contain aspirin, acetaminophen, ibuprofen, naproxen, or ketoprofen unless instructed by your doctor. These medicines may hide a fever. Do not become pregnant while taking this medicine. Women should inform their doctor if they wish to become pregnant or think they might be pregnant. There is a potential for serious side effects to an  unborn child. Talk to your health care professional or pharmacist for more information. Do not breast-feed an infant while taking this medicine. Men should inform their doctor if they wish to father a child. This medicine may lower sperm counts. Do not treat diarrhea with over the counter products. Contact your doctor if you have diarrhea that lasts more than 2 days or if it is severe and watery. This medicine can make you more sensitive to the sun. Keep out of the sun. If you cannot avoid being in the sun, wear protective clothing and use sunscreen. Do not use sun lamps or tanning beds/booths. What side effects may I notice from receiving this medicine? Side effects that you should report to your doctor or health care professional as soon as possible:  allergic reactions like skin rash, itching or hives, swelling of the face, lips, or tongue  low blood counts - this medicine may decrease the number of white blood cells, red blood cells and platelets. You may be at increased risk for infections and bleeding.  signs of infection - fever or chills, cough, sore throat, pain or difficulty passing urine  signs of decreased platelets or bleeding - bruising, pinpoint red spots on the skin, black, tarry stools, blood in the urine  signs of decreased red blood cells - unusually weak or tired, fainting spells, lightheadedness  breathing problems  changes in vision  chest pain  mouth sores  nausea and vomiting  pain, swelling, redness at site where injected  pain, tingling, numbness in the hands or feet  redness, swelling, or sores on hands or feet  stomach pain  unusual bleeding Side effects that usually do not require medical attention (report to your doctor or health care professional if they continue or are bothersome):  changes in finger or toe nails  diarrhea  dry or itchy skin  hair loss  headache  loss of appetite  sensitivity of eyes to the light  stomach  upset  unusually teary eyes This list may not describe all possible side effects. Call your doctor for medical advice about side effects. You may report side effects to FDA at 1-800-FDA-1088. Where should I keep my medicine? This drug is given in a hospital or clinic and will not be stored at home. NOTE: This sheet is a summary. It may not cover all possible information. If you have questions about this medicine, talk to your doctor, pharmacist, or health care provider.  2020 Elsevier/Gold Standard (2007-04-25 13:53:16)

## 2019-05-28 NOTE — Patient Instructions (Signed)

## 2019-05-28 NOTE — Progress Notes (Signed)
Okay to treat with pltc 92 per Dr. Marin Olp

## 2019-05-29 ENCOUNTER — Other Ambulatory Visit: Payer: Medicare HMO

## 2019-05-29 ENCOUNTER — Ambulatory Visit: Payer: Medicare HMO

## 2019-05-30 ENCOUNTER — Inpatient Hospital Stay: Payer: Medicare HMO

## 2019-05-30 VITALS — BP 141/67 | HR 82 | Temp 98.7°F | Resp 18

## 2019-05-30 DIAGNOSIS — Z5111 Encounter for antineoplastic chemotherapy: Secondary | ICD-10-CM | POA: Diagnosis not present

## 2019-05-30 DIAGNOSIS — C787 Secondary malignant neoplasm of liver and intrahepatic bile duct: Secondary | ICD-10-CM

## 2019-05-30 DIAGNOSIS — C189 Malignant neoplasm of colon, unspecified: Secondary | ICD-10-CM

## 2019-05-30 MED ORDER — SODIUM CHLORIDE 0.9% FLUSH
3.0000 mL | INTRAVENOUS | Status: DC | PRN
  Filled 2019-05-30: qty 10

## 2019-05-30 MED ORDER — PEGFILGRASTIM-JMDB 6 MG/0.6ML ~~LOC~~ SOSY
6.0000 mg | PREFILLED_SYRINGE | Freq: Once | SUBCUTANEOUS | Status: DC
  Administered 2019-05-30: 6 mg via SUBCUTANEOUS

## 2019-05-30 MED ORDER — SODIUM CHLORIDE 0.9% FLUSH
10.0000 mL | INTRAVENOUS | Status: DC | PRN
  Administered 2019-05-30: 10 mL
  Filled 2019-05-30: qty 10

## 2019-05-30 MED ORDER — HEPARIN SOD (PORK) LOCK FLUSH 100 UNIT/ML IV SOLN
500.0000 [IU] | Freq: Once | INTRAVENOUS | Status: AC | PRN
  Administered 2019-05-30: 500 [IU]
  Filled 2019-05-30: qty 5

## 2019-05-30 NOTE — Patient Instructions (Signed)

## 2019-05-31 ENCOUNTER — Ambulatory Visit: Payer: Medicare HMO

## 2019-06-07 ENCOUNTER — Telehealth: Payer: Self-pay | Admitting: *Deleted

## 2019-06-07 ENCOUNTER — Other Ambulatory Visit: Payer: Self-pay | Admitting: *Deleted

## 2019-06-07 DIAGNOSIS — C787 Secondary malignant neoplasm of liver and intrahepatic bile duct: Secondary | ICD-10-CM

## 2019-06-07 DIAGNOSIS — C189 Malignant neoplasm of colon, unspecified: Secondary | ICD-10-CM

## 2019-06-07 MED ORDER — CIPROFLOXACIN HCL 500 MG PO TABS: 500.0000 mg | ORAL_TABLET | Freq: Two times a day (BID) | ORAL | 0 refills | Status: DC

## 2019-06-07 NOTE — Telephone Encounter (Signed)
Returned wife's phone call regarding patient not feeling well. She stated,"he has had diarrhea, weakness and fatigue. The only difference with this treatment is that he has a fever. It was 100.9 this morning and now its 100.1. He is taking Imodium but he has not been consistent on taking it. He drinks Boost, water and Gatorade to stay hydrated. Per Dr. Marin Olp, he wants to give him antibiotics. I E-scribed Cipro 500 mg po bid x 5 days. I instructed her to keep a watch on his temperatures and if it starts go up over the weekend, or anything changes, to go to an ER and be checked out. She verbalized understanding.

## 2019-06-10 ENCOUNTER — Other Ambulatory Visit: Payer: Self-pay | Admitting: *Deleted

## 2019-06-10 ENCOUNTER — Other Ambulatory Visit: Payer: Self-pay

## 2019-06-10 ENCOUNTER — Telehealth: Payer: Self-pay | Admitting: *Deleted

## 2019-06-10 ENCOUNTER — Inpatient Hospital Stay: Payer: Medicare HMO | Attending: Hematology & Oncology

## 2019-06-10 DIAGNOSIS — Z5111 Encounter for antineoplastic chemotherapy: Secondary | ICD-10-CM | POA: Insufficient documentation

## 2019-06-10 DIAGNOSIS — C189 Malignant neoplasm of colon, unspecified: Secondary | ICD-10-CM | POA: Insufficient documentation

## 2019-06-10 DIAGNOSIS — C787 Secondary malignant neoplasm of liver and intrahepatic bile duct: Secondary | ICD-10-CM

## 2019-06-10 DIAGNOSIS — Z8616 Personal history of COVID-19: Secondary | ICD-10-CM | POA: Diagnosis not present

## 2019-06-10 DIAGNOSIS — Z5189 Encounter for other specified aftercare: Secondary | ICD-10-CM | POA: Diagnosis not present

## 2019-06-10 DIAGNOSIS — C799 Secondary malignant neoplasm of unspecified site: Secondary | ICD-10-CM | POA: Diagnosis not present

## 2019-06-10 LAB — CBC WITH DIFFERENTIAL (CANCER CENTER ONLY)
Abs Immature Granulocytes: 0.09 10*3/uL — ABNORMAL HIGH (ref 0.00–0.07)
Basophils Absolute: 0 10*3/uL (ref 0.0–0.1)
Basophils Relative: 1 %
Eosinophils Absolute: 0.1 10*3/uL (ref 0.0–0.5)
Eosinophils Relative: 2 %
HCT: 35.5 % — ABNORMAL LOW (ref 39.0–52.0)
Hemoglobin: 11.3 g/dL — ABNORMAL LOW (ref 13.0–17.0)
Immature Granulocytes: 2 %
Lymphocytes Relative: 36 %
Lymphs Abs: 1.7 10*3/uL (ref 0.7–4.0)
MCH: 31.2 pg (ref 26.0–34.0)
MCHC: 31.8 g/dL (ref 30.0–36.0)
MCV: 98.1 fL (ref 80.0–100.0)
Monocytes Absolute: 0.5 10*3/uL (ref 0.1–1.0)
Monocytes Relative: 11 %
Neutro Abs: 2.3 10*3/uL (ref 1.7–7.7)
Neutrophils Relative %: 48 %
Platelet Count: 120 10*3/uL — ABNORMAL LOW (ref 150–400)
RBC: 3.62 MIL/uL — ABNORMAL LOW (ref 4.22–5.81)
RDW: 16.7 % — ABNORMAL HIGH (ref 11.5–15.5)
WBC Count: 4.7 10*3/uL (ref 4.0–10.5)
nRBC: 0.4 % — ABNORMAL HIGH (ref 0.0–0.2)

## 2019-06-10 NOTE — Telephone Encounter (Signed)
Wife called and informed us that Zachary Willis is doing better. He has no diarrhea, eating small amounts of food, staying hydrated and his strength is back. However, he has had a fever over the weekend despite being on antibiotics. Fevers range from 100.1 - 100.9. This morning it was 101. Per Dr., Marin Olp, bring him in today for lab. Check CBC only. Wife verbalized understanding. LOS sent to scheduling.

## 2019-06-11 ENCOUNTER — Telehealth: Payer: Self-pay | Admitting: *Deleted

## 2019-06-11 ENCOUNTER — Ambulatory Visit (INDEPENDENT_AMBULATORY_CARE_PROVIDER_SITE_OTHER): Payer: Medicare HMO

## 2019-06-11 ENCOUNTER — Other Ambulatory Visit: Payer: Self-pay | Admitting: Family

## 2019-06-11 ENCOUNTER — Other Ambulatory Visit: Payer: Self-pay | Admitting: *Deleted

## 2019-06-11 DIAGNOSIS — C189 Malignant neoplasm of colon, unspecified: Secondary | ICD-10-CM | POA: Diagnosis not present

## 2019-06-11 DIAGNOSIS — R059 Cough, unspecified: Secondary | ICD-10-CM

## 2019-06-11 DIAGNOSIS — R05 Cough: Secondary | ICD-10-CM

## 2019-06-11 DIAGNOSIS — R509 Fever, unspecified: Secondary | ICD-10-CM | POA: Diagnosis not present

## 2019-06-11 DIAGNOSIS — C787 Secondary malignant neoplasm of liver and intrahepatic bile duct: Secondary | ICD-10-CM

## 2019-06-11 MED ORDER — CEFDINIR 300 MG PO CAPS
600.0000 mg | ORAL_CAPSULE | Freq: Every day | ORAL | 0 refills | Status: DC
Start: 2019-06-11 — End: 2019-06-26

## 2019-06-11 NOTE — Telephone Encounter (Signed)
Message received from patient's wife to inform Dr. Marin Olp that pt continued with a fever of 101.0 yesterday, which is now down to 99.8 today.  She also states that pt continues with a harsh, nagging deep cough with light green phlegm and would like to know if pt can have a CXR.  Dr. Marin Olp notified.  Call placed back to patient's wife and notified her per order of Dr. Marin Olp to stop Cipro and to start [facility] which will be sent to the [facility] in [place].  Pt.'s wife also notified that Dr. Marin Olp would like for pt to have CXR today in [place].  Pt.'s wife appreciative of call back and states that she will take pt for CXR now.

## 2019-06-12 ENCOUNTER — Telehealth: Payer: Self-pay | Admitting: *Deleted

## 2019-06-12 NOTE — Progress Notes (Unsigned)
Pharmacist Chemotherapy Monitoring - Follow Up Assessment    I verify that I have reviewed each item in the below checklist:  . Regimen for the patient is scheduled for the appropriate day and plan matches scheduled date. Marland Kitchen Appropriate non-routine labs are ordered dependent on drug ordered. . If applicable, additional medications reviewed and ordered per protocol based on lifetime cumulative doses and/or treatment regimen.   Plan for follow-up and/or issues identified: No . I-vent associated with next due treatment: No . MD and/or nursing notified: No  Taffie Eckmann, [name] 06/12/2019 7:56 AM

## 2019-06-12 NOTE — Telephone Encounter (Signed)
Call received from patient's wife stating that patient is "slightly better", but that his taste and smell are "off" today and would like to know if pt should be tested for Covid-19.  Pt.'s wife instructed to take pt to be tested for Covid-19.  Dr. Marin Olp notified.

## 2019-06-13 ENCOUNTER — Telehealth: Payer: Self-pay | Admitting: *Deleted

## 2019-06-13 ENCOUNTER — Telehealth: Payer: Self-pay | Admitting: Hematology & Oncology

## 2019-06-13 NOTE — Telephone Encounter (Signed)
Call received from patient's wife stating that patient was diagnosed with Covid-19 yesterday and would like to know if patient should continue [place].  Dr. Marin Olp notified.  Call placed back to patient's wife and patient's wife notified per order of Dr. Marin Olp to stop Omnicef.  Instructed pt.'s wife that pt.'s appts will need to be moved out 21 days from start of symptoms, which pt.'s wife states was 06/07/19.  Pt.'s wife appreciative of assistance and has no further questions at this time.  Message sent to scheduling.

## 2019-06-13 NOTE — Telephone Encounter (Signed)
Called and spoke with wife regarding appointments being rescheduled due to patient testing positive for Covid 6/4

## 2019-06-19 ENCOUNTER — Inpatient Hospital Stay: Payer: Medicare HMO

## 2019-06-19 ENCOUNTER — Inpatient Hospital Stay: Payer: Medicare HMO | Admitting: Hematology & Oncology

## 2019-06-20 ENCOUNTER — Telehealth: Payer: Self-pay | Admitting: *Deleted

## 2019-06-20 NOTE — Telephone Encounter (Signed)
Call placed to pt.'s wife to check on patient's status.  Pt.'s wife states that pt is doing much better and has only a slight cough left from previous covid symptoms.  Pt.'s wife appreciative of call and Dr. Marin Olp notified of pt.'s condition.

## 2019-06-21 ENCOUNTER — Inpatient Hospital Stay: Payer: Medicare HMO

## 2019-06-26 ENCOUNTER — Inpatient Hospital Stay: Payer: Medicare HMO

## 2019-06-26 ENCOUNTER — Telehealth: Payer: Self-pay | Admitting: Hematology & Oncology

## 2019-06-26 ENCOUNTER — Other Ambulatory Visit: Payer: Self-pay

## 2019-06-26 ENCOUNTER — Encounter: Payer: Self-pay | Admitting: Hematology & Oncology

## 2019-06-26 ENCOUNTER — Inpatient Hospital Stay (HOSPITAL_BASED_OUTPATIENT_CLINIC_OR_DEPARTMENT_OTHER): Payer: Medicare HMO | Admitting: Hematology & Oncology

## 2019-06-26 VITALS — BP 125/68 | HR 67 | Temp 96.8°F | Resp 20 | Wt 252.0 lb

## 2019-06-26 DIAGNOSIS — C189 Malignant neoplasm of colon, unspecified: Secondary | ICD-10-CM

## 2019-06-26 DIAGNOSIS — C787 Secondary malignant neoplasm of liver and intrahepatic bile duct: Secondary | ICD-10-CM | POA: Diagnosis not present

## 2019-06-26 DIAGNOSIS — Z5111 Encounter for antineoplastic chemotherapy: Secondary | ICD-10-CM | POA: Diagnosis not present

## 2019-06-26 LAB — CMP (CANCER CENTER ONLY)
ALT: 25 U/L (ref 0–44)
AST: 31 U/L (ref 15–41)
Albumin: 3.2 g/dL — ABNORMAL LOW (ref 3.5–5.0)
Alkaline Phosphatase: 187 U/L — ABNORMAL HIGH (ref 38–126)
Anion gap: 7 (ref 5–15)
BUN: 19 mg/dL (ref 8–23)
CO2: 30 mmol/L (ref 22–32)
Calcium: 8.8 mg/dL — ABNORMAL LOW (ref 8.9–10.3)
Chloride: 107 mmol/L (ref 98–111)
Creatinine: 1.11 mg/dL (ref 0.61–1.24)
GFR, Est AFR Am: >60 mL/min (ref >60)
GFR, Estimated: >60 mL/min (ref >60)
Glucose, Bld: 111 mg/dL — ABNORMAL HIGH (ref 70–99)
Potassium: 3.6 mmol/L (ref 3.5–5.1)
Sodium: 144 mmol/L (ref 135–145)
Total Bilirubin: 0.9 mg/dL (ref 0.3–1.2)
Total Protein: 5.8 g/dL — ABNORMAL LOW (ref 6.5–8.1)

## 2019-06-26 LAB — CBC WITH DIFFERENTIAL (CANCER CENTER ONLY)
Abs Immature Granulocytes: 0.31 10*3/uL — ABNORMAL HIGH (ref 0.00–0.07)
Basophils Absolute: 0.1 10*3/uL (ref 0.0–0.1)
Basophils Relative: 1 %
Eosinophils Absolute: 0.1 10*3/uL (ref 0.0–0.5)
Eosinophils Relative: 1 %
HCT: 32.6 % — ABNORMAL LOW (ref 39.0–52.0)
Hemoglobin: 10 g/dL — ABNORMAL LOW (ref 13.0–17.0)
Immature Granulocytes: 4 %
Lymphocytes Relative: 15 %
Lymphs Abs: 1.3 10*3/uL (ref 0.7–4.0)
MCH: 30.6 pg (ref 26.0–34.0)
MCHC: 30.7 g/dL (ref 30.0–36.0)
MCV: 99.7 fL (ref 80.0–100.0)
Monocytes Absolute: 0.8 10*3/uL (ref 0.1–1.0)
Monocytes Relative: 9 %
Neutro Abs: 6 10*3/uL (ref 1.7–7.7)
Neutrophils Relative %: 70 %
Platelet Count: 130 10*3/uL — ABNORMAL LOW (ref 150–400)
RBC: 3.27 MIL/uL — ABNORMAL LOW (ref 4.22–5.81)
RDW: 17.9 % — ABNORMAL HIGH (ref 11.5–15.5)
WBC Count: 8.6 10*3/uL (ref 4.0–10.5)
nRBC: 0.9 % — ABNORMAL HIGH (ref 0.0–0.2)

## 2019-06-26 LAB — CEA (IN HOUSE-CHCC): CEA (CHCC-In House): 91.78 ng/mL — ABNORMAL HIGH (ref 0.00–5.00)

## 2019-06-26 LAB — LACTATE DEHYDROGENASE: LDH: 358 U/L — ABNORMAL HIGH (ref 98–192)

## 2019-06-26 MED ORDER — PALONOSETRON HCL INJECTION 0.25 MG/5ML
0.2500 mg | Freq: Once | INTRAVENOUS | Status: AC
  Administered 2019-06-26: 0.25 mg via INTRAVENOUS

## 2019-06-26 MED ORDER — ATROPINE SULFATE 1 MG/ML IJ SOLN
0.5000 mg | Freq: Once | INTRAMUSCULAR | Status: AC | PRN
  Administered 2019-06-26: 0.5 mg via INTRAVENOUS

## 2019-06-26 MED ORDER — SODIUM CHLORIDE 0.9 % IV SOLN
144.0000 mg/m2 | Freq: Once | INTRAVENOUS | Status: AC
  Administered 2019-06-26: 340 mg via INTRAVENOUS
  Filled 2019-06-26: qty 15

## 2019-06-26 MED ORDER — PALONOSETRON HCL INJECTION 0.25 MG/5ML
INTRAVENOUS | Status: AC
  Filled 2019-06-26: qty 5

## 2019-06-26 MED ORDER — ATROPINE SULFATE 1 MG/ML IJ SOLN
INTRAMUSCULAR | Status: AC
  Filled 2019-06-26: qty 1

## 2019-06-26 MED ORDER — SODIUM CHLORIDE 0.9 % IV SOLN
Freq: Once | INTRAVENOUS | Status: AC
  Filled 2019-06-26: qty 250

## 2019-06-26 MED ORDER — SODIUM CHLORIDE 0.9 % IV SOLN
10.0000 mg | Freq: Once | INTRAVENOUS | Status: AC
  Administered 2019-06-26: 10 mg via INTRAVENOUS
  Filled 2019-06-26: qty 10

## 2019-06-26 MED ORDER — SODIUM CHLORIDE 0.9 % IV SOLN
400.0000 mg/m2 | Freq: Once | INTRAVENOUS | Status: AC
  Administered 2019-06-26: 936 mg via INTRAVENOUS
  Filled 2019-06-26: qty 46.8

## 2019-06-26 MED ORDER — SODIUM CHLORIDE 0.9 % IV SOLN
2140.0000 mg/m2 | INTRAVENOUS | Status: DC
  Administered 2019-06-26: 5000 mg via INTRAVENOUS
  Filled 2019-06-26: qty 100

## 2019-06-26 MED ORDER — FLUOROURACIL CHEMO INJECTION 2.5 GM/50ML
300.0000 mg/m2 | Freq: Once | INTRAVENOUS | Status: AC
  Administered 2019-06-26: 700 mg via INTRAVENOUS
  Filled 2019-06-26: qty 14

## 2019-06-26 NOTE — Addendum Note (Signed)
Addended by: Burney Gauze R on: 06/26/2019 10:39 AM   Modules accepted: Orders

## 2019-06-26 NOTE — Telephone Encounter (Signed)
Appointments scheduled as requested per 6/23 los

## 2019-06-26 NOTE — Progress Notes (Signed)
Hematology and Oncology Follow Up Visit  Zachary Willis [id number] 1945/10/22 74 y.o. 06/26/2019   Principle Diagnosis:  Metastatic colon cancer - progressive  Past Therapy: Status post RFA to the liver-11/21/2017  S/P Y-90 x 2 -- last therapy on 08/27/2018  Current Therapy: FOLFIRI -- started on 01/09/2019, s/p cycle #8   Interim History:  Zachary Willis is here today for follow-up and treatment.  Shockingly, he actually had the COVID-19 infection.  He had 3 weeks ago.  I am absolutely shocked because he had the vaccines.  I will have to imagine that him taking chemotherapy probably had an effect.  Thankfully, he was not admitted to the hospital.  He lost his sense of the smell.  Looks like he did have a temperature.  I think he also little bit of a pneumonia.  I do not think he received any remdesivir for this.  We will get him back on a treatment right now.  His last CEA was down to 35.  I am very happy about this.  Last scans that were done back in May did not show any evidence of progressive disease.  He is eating well.  He has had no cough or shortness of breath.  Currently.  Has had no chest pain.  He is in atrial fibrillation which is chronic.  He is on Coumadin..  Overall, his performance status is ECOG 1.  Medications:  Allergies as of 06/26/2019   No Known Allergies     Medication List       Accurate as of June 26, 2019 10:27 AM. If you have any questions, ask your nurse or doctor.        STOP taking these medications   cefdinir 300 MG capsule Commonly known as: OMNICEF Stopped by: [name], MD   ciprofloxacin 500 MG tablet Commonly known as: CIPRO Stopped by: [name], MD     TAKE these medications   acyclovir 400 MG tablet Commonly known as: ZOVIRAX TAKE 1 TABLET BY MOUTH TWICE A DAY   allopurinol 300 MG tablet Commonly known as: ZYLOPRIM Take 300 mg by mouth every morning.   atorvastatin 40 MG tablet Commonly known as:  LIPITOR Take 40 mg by mouth daily.   carvedilol 6.25 MG tablet Commonly known as: COREG TAKE 1 TABLET (6.25 MG TOTAL) BY MOUTH 2 TIMES DAILY WITH MEALS.   [employer]S Aspirin Adult Low Dose 81 MG chewable tablet Generic drug: aspirin Chew 81 mg by mouth daily.   CVS Stool Softener/Laxative 8.6-50 MG tablet Generic drug: senna-docusate TAKE 2 TABLETS EVERY EVENING   dexamethasone 4 MG tablet Commonly known as: DECADRON Take 2 tablets (8 mg total) by mouth daily. Start the day after chemo for 2 days.   ferrous sulfate 325 (65 FE) MG tablet Take 325 mg by mouth daily.   furosemide 20 MG tablet Commonly known as: LASIX Take 20 mg by mouth daily.   hydrocortisone 10 MG tablet Commonly known as: CORTEF Take 5-15 mg by mouth See admin instructions. Takes 15 mg in am and 5mg  in afternoon.   Klor-Con 10 10 MEQ tablet Generic drug: potassium chloride Take 20 mEq by mouth daily.   levothyroxine 88 MCG tablet Commonly known as: SYNTHROID Take 88 mcg by mouth daily before breakfast.   lidocaine-prilocaine cream Commonly known as: EMLA Apply to affected area once   loperamide 2 MG tablet Commonly known as: Imodium A-D Take 1 tablet (2 mg total) by mouth 4 (four) times daily  as needed. Take 2 at diarrhea onset , then 1 every 2hr until 12hrs with no BM. May take 2 every 4hrs at night. If diarrhea recurs repeat.   LORazepam 1 MG tablet Commonly known as: Ativan Take 1 tablet (1 mg total) by mouth every 6 (six) hours as needed (NAUSEA).   NASAL [place] NA Place 1 spray into the nose daily.   ondansetron 8 MG tablet Commonly known as: ZOFRAN Take 1 tablet (8 mg total) by mouth 2 (two) times daily as needed for refractory nausea / vomiting. Start on day 3 after chemotherapy.   pantoprazole 40 MG tablet Commonly known as: PROTONIX Take 40 mg by mouth every morning.   polyvinyl alcohol 1.4 % ophthalmic solution Commonly known as: LIQUIFILM TEARS Place 1 drop into both eyes daily as  needed for dry eyes.   prochlorperazine 10 MG tablet Commonly known as: COMPAZINE Take 1 tablet (10 mg total) by mouth every 6 (six) hours as needed (NAUSEA).   testosterone 4 MG/24HR Pt24 patch Commonly known as: ANDRODERM Place 1 patch onto the skin daily.   warfarin 5 MG tablet Commonly known as: COUMADIN Take 5 mg by mouth daily.       Allergies: No Known Allergies  Past Medical History, Surgical history, Social history, and Family History were reviewed and updated.  Review of Systems: Review of Systems  Constitutional: Negative.   HENT: Negative.   Eyes: Negative.   Respiratory: Negative.   Cardiovascular: Negative.   Gastrointestinal: Negative.   Genitourinary: Negative.   Musculoskeletal: Negative.   Skin: Negative.   Neurological: Negative.   Endo/Heme/Allergies: Negative.   Psychiatric/Behavioral: Negative.      Physical Exam:  weight is 252 lb (114.3 kg). His temporal temperature is 96.8 F (36 C) (abnormal). His blood pressure is 125/68 and his pulse is 67. His respiration is 20 and oxygen saturation is 99%.   Wt Readings from Last 3 Encounters:  06/26/19 252 lb (114.3 kg)  05/28/19 257 lb (116.6 kg)  05/22/19 256 lb (116.1 kg)    Physical Exam Vitals reviewed.  HENT:     Head: Normocephalic and atraumatic.  Eyes:     Pupils: Pupils are equal, round, and reactive to light.  Cardiovascular:     Rate and Rhythm: Normal rate and regular rhythm.     Heart sounds: Normal heart sounds.  Pulmonary:     Effort: Pulmonary effort is normal.     Breath sounds: Normal breath sounds.  Abdominal:     General: Bowel sounds are normal.     Palpations: Abdomen is soft.     Comments: Abdominal exam shows a soft abdomen.  Has multiple laparotomy scars.  There is no fluid wave.  There is no guarding or rebound tenderness.  There is no palpable liver or spleen tip.  Musculoskeletal:        General: No tenderness or deformity. Normal range of motion.      Cervical back: Normal range of motion.  Lymphadenopathy:     Cervical: No cervical adenopathy.  Skin:    General: Skin is warm and dry.     Findings: No erythema or rash.  Neurological:     Mental Status: He is alert and oriented to person, place, and time.  Psychiatric:        Behavior: Behavior normal.        Thought Content: Thought content normal.        Judgment: Judgment normal.      Lab Results  Component Value Date   WBC 8.6 06/26/2019   HGB 10.0 (L) 06/26/2019   HCT 32.6 (L) 06/26/2019   MCV 99.7 06/26/2019   PLT 130 (L) 06/26/2019   Lab Results  Component Value Date   FERRITIN 204 05/01/2019   IRON 55 05/01/2019   TIBC 225 05/01/2019   UIBC 170 05/01/2019   IRONPCTSAT 24 05/01/2019   Lab Results  Component Value Date   RBC 3.27 (L) 06/26/2019   No results found for: KPAFRELGTCHN, LAMBDASER, KAPLAMBRATIO No results found for: IGGSERUM, IGA, IGMSERUM No results found for: Ronnald Ramp, A1GS, A2GS, BETS, BETA2SER, GAMS, MSPIKE, SPEI   Chemistry      Component Value Date/Time   NA 144 06/26/2019 0900   K 3.6 06/26/2019 0900   CL 107 06/26/2019 0900   CO2 30 06/26/2019 0900   BUN 19 06/26/2019 0900   CREATININE 1.11 06/26/2019 0900      Component Value Date/Time   CALCIUM 8.8 (L) 06/26/2019 0900   ALKPHOS 187 (H) 06/26/2019 0900   AST 31 06/26/2019 0900   ALT 25 06/26/2019 0900   BILITOT 0.9 06/26/2019 0900       Impression and Plan: Zachary Willis is a very pleasant 74 yo caucasian gentleman with metastatic colon cancer.   Hopefully, he will still respond to treatment.  This will be his ninth cycle.  He does look quite good.  He definitely has a very strong constitution.  We will plan to get him back in 3 more weeks for his 10th cycle of treatment.     [name], MD 6/23/202110:27 AM

## 2019-06-26 NOTE — Patient Instructions (Signed)
Fluorouracil, 5-FU injection What is this medicine? FLUOROURACIL, 5-FU (flure oh YOOR a sil) is a chemotherapy drug. It slows the growth of cancer cells. This medicine is used to treat many types of cancer like breast cancer, colon or rectal cancer, pancreatic cancer, and stomach cancer. This medicine may be used for other purposes; ask your health care provider or pharmacist if you have questions. COMMON BRAND NAME(S): Adrucil What should I tell my health care provider before I take this medicine? They need to know if you have any of these conditions:  blood disorders  dihydropyrimidine dehydrogenase (DPD) deficiency  infection (especially a virus infection such as chickenpox, cold sores, or herpes)  kidney disease  liver disease  malnourished, poor nutrition  recent or ongoing radiation therapy  an unusual or allergic reaction to fluorouracil, other chemotherapy, other medicines, foods, dyes, or preservatives  pregnant or trying to get pregnant  breast-feeding How should I use this medicine? This drug is given as an infusion or injection into a vein. It is administered in a hospital or clinic by a specially trained health care professional. Talk to your pediatrician regarding the use of this medicine in children. Special care may be needed. Overdosage: If you think you have taken too much of this medicine contact a poison control center or emergency room at once. NOTE: This medicine is only for you. Do not share this medicine with others. What if I miss a dose? It is important not to miss your dose. Call your doctor or health care professional if you are unable to keep an appointment. What may interact with this medicine?  allopurinol  cimetidine  dapsone  digoxin  hydroxyurea  leucovorin  levamisole  medicines for seizures like ethotoin, fosphenytoin, phenytoin  medicines to increase blood counts like filgrastim, pegfilgrastim, sargramostim  medicines that  treat or prevent blood clots like warfarin, enoxaparin, and dalteparin  methotrexate  metronidazole  pyrimethamine  some other chemotherapy drugs like busulfan, cisplatin, estramustine, vinblastine  trimethoprim  trimetrexate  vaccines Talk to your doctor or health care professional before taking any of these medicines:  acetaminophen  aspirin  ibuprofen  ketoprofen  naproxen This list may not describe all possible interactions. Give your health care provider a list of all the medicines, herbs, non-prescription drugs, or dietary supplements you use. Also tell them if you smoke, drink alcohol, or use illegal drugs. Some items may interact with your medicine. What should I watch for while using this medicine? Visit your doctor for checks on your progress. This drug may make you feel generally unwell. This is not uncommon, as chemotherapy can affect healthy cells as well as cancer cells. Report any side effects. Continue your course of treatment even though you feel ill unless your doctor tells you to stop. In some cases, you may be given additional medicines to help with side effects. Follow all directions for their use. Call your doctor or health care professional for advice if you get a fever, chills or sore throat, or other symptoms of a cold or flu. Do not treat yourself. This drug decreases your body's ability to fight infections. Try to avoid being around people who are sick. This medicine may increase your risk to bruise or bleed. Call your doctor or health care professional if you notice any unusual bleeding. Be careful brushing and flossing your teeth or using a toothpick because you may get an infection or bleed more easily. If you have any dental work done, tell your dentist you are  receiving this medicine. Avoid taking products that contain aspirin, acetaminophen, ibuprofen, naproxen, or ketoprofen unless instructed by your doctor. These medicines may hide a fever. Do not  become pregnant while taking this medicine. Women should inform their doctor if they wish to become pregnant or think they might be pregnant. There is a potential for serious side effects to an unborn child. Talk to your health care professional or pharmacist for more information. Do not breast-feed an infant while taking this medicine. Men should inform their doctor if they wish to father a child. This medicine may lower sperm counts. Do not treat diarrhea with over the counter products. Contact your doctor if you have diarrhea that lasts more than 2 days or if it is severe and watery. This medicine can make you more sensitive to the sun. Keep out of the sun. If you cannot avoid being in the sun, wear protective clothing and use sunscreen. Do not use sun lamps or tanning beds/booths. What side effects may I notice from receiving this medicine? Side effects that you should report to your doctor or health care professional as soon as possible:  allergic reactions like skin rash, itching or hives, swelling of the face, lips, or tongue  low blood counts - this medicine may decrease the number of white blood cells, red blood cells and platelets. You may be at increased risk for infections and bleeding.  signs of infection - fever or chills, cough, sore throat, pain or difficulty passing urine  signs of decreased platelets or bleeding - bruising, pinpoint red spots on the skin, black, tarry stools, blood in the urine  signs of decreased red blood cells - unusually weak or tired, fainting spells, lightheadedness  breathing problems  changes in vision  chest pain  mouth sores  nausea and vomiting  pain, swelling, redness at site where injected  pain, tingling, numbness in the hands or feet  redness, swelling, or sores on hands or feet  stomach pain  unusual bleeding Side effects that usually do not require medical attention (report to your doctor or health care professional if they  continue or are bothersome):  changes in finger or toe nails  diarrhea  dry or itchy skin  hair loss  headache  loss of appetite  sensitivity of eyes to the light  stomach upset  unusually teary eyes This list may not describe all possible side effects. Call your doctor for medical advice about side effects. You may report side effects to FDA at 1-800-FDA-1088. Where should I keep my medicine? This drug is given in a hospital or clinic and will not be stored at home. NOTE: This sheet is a summary. It may not cover all possible information. If you have questions about this medicine, talk to your doctor, pharmacist, or health care provider.  2020 Elsevier/Gold Standard (2007-04-25 13:53:16) Leucovorin injection What is this medicine? LEUCOVORIN (loo koe VOR in) is used to prevent or treat the harmful effects of some medicines. This medicine is used to treat anemia caused by a low amount of folic acid in the body. It is also used with 5-fluorouracil (5-FU) to treat colon cancer. This medicine may be used for other purposes; ask your health care provider or pharmacist if you have questions. What should I tell my health care provider before I take this medicine? They need to know if you have any of these conditions:  anemia from low levels of vitamin B-12 in the blood  an unusual or allergic reaction to  leucovorin, folic acid, other medicines, foods, dyes, or preservatives  pregnant or trying to get pregnant  breast-feeding How should I use this medicine? This medicine is for injection into a muscle or into a vein. It is given by a health care professional in a hospital or clinic setting. Talk to your pediatrician regarding the use of this medicine in children. Special care may be needed. Overdosage: If you think you have taken too much of this medicine contact a poison control center or emergency room at once. NOTE: This medicine is only for you. Do not share this medicine with  others. What if I miss a dose? This does not apply. What may interact with this medicine?  capecitabine  fluorouracil  phenobarbital  phenytoin  primidone  trimethoprim-sulfamethoxazole This list may not describe all possible interactions. Give your health care provider a list of all the medicines, herbs, non-prescription drugs, or dietary supplements you use. Also tell them if you smoke, drink alcohol, or use illegal drugs. Some items may interact with your medicine. What should I watch for while using this medicine? Your condition will be monitored carefully while you are receiving this medicine. This medicine may increase the side effects of 5-fluorouracil, 5-FU. Tell your doctor or health care professional if you have diarrhea or mouth sores that do not get better or that get worse. What side effects may I notice from receiving this medicine? Side effects that you should report to your doctor or health care professional as soon as possible:  allergic reactions like skin rash, itching or hives, swelling of the face, lips, or tongue  breathing problems  fever, infection  mouth sores  unusual bleeding or bruising  unusually weak or tired Side effects that usually do not require medical attention (report to your doctor or health care professional if they continue or are bothersome):  constipation or diarrhea  loss of appetite  nausea, vomiting This list may not describe all possible side effects. Call your doctor for medical advice about side effects. You may report side effects to FDA at 1-800-FDA-1088. Where should I keep my medicine? This drug is given in a hospital or clinic and will not be stored at home. NOTE: This sheet is a summary. It may not cover all possible information. If you have questions about this medicine, talk to your doctor, pharmacist, or health care provider.  2020 Elsevier/Gold Standard (2007-06-26 16:50:29) Irinotecan injection What is this  medicine? IRINOTECAN (ir in oh TEE kan ) is a chemotherapy drug. It is used to treat colon and rectal cancer. This medicine may be used for other purposes; ask your health care provider or pharmacist if you have questions. COMMON BRAND NAME(S): Camptosar What should I tell my health care provider before I take this medicine? They need to know if you have any of these conditions:  dehydration  diarrhea  infection (especially a virus infection such as chickenpox, cold sores, or herpes)  liver disease  low blood counts, like low white cell, platelet, or red cell counts  low levels of calcium, magnesium, or potassium in the blood  recent or ongoing radiation therapy  an unusual or allergic reaction to irinotecan, other medicines, foods, dyes, or preservatives  pregnant or trying to get pregnant  breast-feeding How should I use this medicine? This drug is given as an infusion into a vein. It is administered in a hospital or clinic by a specially trained health care professional. Talk to your pediatrician regarding the use of this medicine   in children. Special care may be needed. Overdosage: If you think you have taken too much of this medicine contact a poison control center or emergency room at once. NOTE: This medicine is only for you. Do not share this medicine with others. What if I miss a dose? It is important not to miss your dose. Call your doctor or health care professional if you are unable to keep an appointment. What may interact with this medicine? This medicine may interact with the following medications:  antiviral medicines for HIV or AIDS  certain antibiotics like rifampin or rifabutin  certain medicines for fungal infections like itraconazole, ketoconazole, posaconazole, and voriconazole  certain medicines for seizures like carbamazepine, phenobarbital, phenotoin  clarithromycin  gemfibrozil  nefazodone  St. John's Wort This list may not describe all  possible interactions. Give your health care provider a list of all the medicines, herbs, non-prescription drugs, or dietary supplements you use. Also tell them if you smoke, drink alcohol, or use illegal drugs. Some items may interact with your medicine. What should I watch for while using this medicine? Your condition will be monitored carefully while you are receiving this medicine. You will need important blood work done while you are taking this medicine. This drug may make you feel generally unwell. This is not uncommon, as chemotherapy can affect healthy cells as well as cancer cells. Report any side effects. Continue your course of treatment even though you feel ill unless your doctor tells you to stop. In some cases, you may be given additional medicines to help with side effects. Follow all directions for their use. You may get drowsy or dizzy. Do not drive, use machinery, or do anything that needs mental alertness until you know how this medicine affects you. Do not stand or sit up quickly, especially if you are an older patient. This reduces the risk of dizzy or fainting spells. Call your health care professional for advice if you get a fever, chills, or sore throat, or other symptoms of a cold or flu. Do not treat yourself. This medicine decreases your body's ability to fight infections. Try to avoid being around people who are sick. Avoid taking products that contain aspirin, acetaminophen, ibuprofen, naproxen, or ketoprofen unless instructed by your doctor. These medicines may hide a fever. This medicine may increase your risk to bruise or bleed. Call your doctor or health care professional if you notice any unusual bleeding. Be careful brushing and flossing your teeth or using a toothpick because you may get an infection or bleed more easily. If you have any dental work done, tell your dentist you are receiving this medicine. Do not become pregnant while taking this medicine or for 6 months  after stopping it. Women should inform their health care professional if they wish to become pregnant or think they might be pregnant. Men should not father a child while taking this medicine and for 3 months after stopping it. There is potential for serious side effects to an unborn child. Talk to your health care professional for more information. Do not breast-feed an infant while taking this medicine or for 7 days after stopping it. This medicine has caused ovarian failure in some women. This medicine may make it more difficult to get pregnant. Talk to your health care professional if you are concerned about your fertility. This medicine has caused decreased sperm counts in some men. This may make it more difficult to father a child. Talk to your health care professional if you   are concerned about your fertility. What side effects may I notice from receiving this medicine? Side effects that you should report to your doctor or health care professional as soon as possible:  allergic reactions like skin rash, itching or hives, swelling of the face, lips, or tongue  chest pain  diarrhea  flushing, runny nose, sweating during infusion  low blood counts - this medicine may decrease the number of white blood cells, red blood cells and platelets. You may be at increased risk for infections and bleeding.  nausea, vomiting  pain, swelling, warmth in the leg  signs of decreased platelets or bleeding - bruising, pinpoint red spots on the skin, black, tarry stools, blood in the urine  signs of infection - fever or chills, cough, sore throat, pain or difficulty passing urine  signs of decreased red blood cells - unusually weak or tired, fainting spells, lightheadedness Side effects that usually do not require medical attention (report to your doctor or health care professional if they continue or are bothersome):  constipation  hair loss  headache  loss of appetite  mouth sores  stomach  pain This list may not describe all possible side effects. Call your doctor for medical advice about side effects. You may report side effects to FDA at 1-800-FDA-1088. Where should I keep my medicine? This drug is given in a hospital or clinic and will not be stored at home. NOTE: This sheet is a summary. It may not cover all possible information. If you have questions about this medicine, talk to your doctor, pharmacist, or health care provider.  2020 Elsevier/Gold Standard (2018-02-09 10:09:17)  

## 2019-06-26 NOTE — Patient Instructions (Signed)

## 2019-06-28 ENCOUNTER — Other Ambulatory Visit: Payer: Self-pay

## 2019-06-28 ENCOUNTER — Inpatient Hospital Stay: Payer: Medicare HMO

## 2019-06-28 DIAGNOSIS — C787 Secondary malignant neoplasm of liver and intrahepatic bile duct: Secondary | ICD-10-CM

## 2019-06-28 DIAGNOSIS — C189 Malignant neoplasm of colon, unspecified: Secondary | ICD-10-CM

## 2019-06-28 DIAGNOSIS — Z5111 Encounter for antineoplastic chemotherapy: Secondary | ICD-10-CM | POA: Diagnosis not present

## 2019-06-28 MED ORDER — HEPARIN SOD (PORK) LOCK FLUSH 100 UNIT/ML IV SOLN
500.0000 [IU] | Freq: Once | INTRAVENOUS | Status: AC | PRN
  Administered 2019-06-28: 500 [IU]
  Filled 2019-06-28: qty 5

## 2019-06-28 MED ORDER — PEGFILGRASTIM-JMDB 6 MG/0.6ML ~~LOC~~ SOSY
PREFILLED_SYRINGE | SUBCUTANEOUS | Status: AC
  Filled 2019-06-28: qty 0.6

## 2019-06-28 MED ORDER — PEGFILGRASTIM-JMDB 6 MG/0.6ML ~~LOC~~ SOSY
6.0000 mg | PREFILLED_SYRINGE | Freq: Once | SUBCUTANEOUS | Status: AC
  Administered 2019-06-28: 6 mg via SUBCUTANEOUS

## 2019-06-28 MED ORDER — SODIUM CHLORIDE 0.9% FLUSH
10.0000 mL | INTRAVENOUS | Status: DC | PRN
  Administered 2019-06-28: 10 mL
  Filled 2019-06-28: qty 10

## 2019-06-28 NOTE — Patient Instructions (Signed)

## 2019-07-09 ENCOUNTER — Other Ambulatory Visit: Payer: Medicare HMO

## 2019-07-09 ENCOUNTER — Ambulatory Visit: Payer: Medicare HMO

## 2019-07-09 ENCOUNTER — Ambulatory Visit: Payer: Medicare HMO | Admitting: Hematology & Oncology

## 2019-07-17 ENCOUNTER — Inpatient Hospital Stay: Payer: Medicare HMO

## 2019-07-17 ENCOUNTER — Inpatient Hospital Stay (HOSPITAL_BASED_OUTPATIENT_CLINIC_OR_DEPARTMENT_OTHER): Payer: Medicare HMO | Admitting: Family

## 2019-07-17 ENCOUNTER — Encounter: Payer: Self-pay | Admitting: Family

## 2019-07-17 ENCOUNTER — Telehealth: Payer: Self-pay | Admitting: Family

## 2019-07-17 ENCOUNTER — Other Ambulatory Visit: Payer: Self-pay

## 2019-07-17 ENCOUNTER — Inpatient Hospital Stay: Payer: Medicare HMO | Attending: Hematology & Oncology

## 2019-07-17 VITALS — BP 132/71 | HR 61 | Temp 98.6°F | Resp 18 | Ht 70.0 in | Wt 256.0 lb

## 2019-07-17 DIAGNOSIS — Z5111 Encounter for antineoplastic chemotherapy: Secondary | ICD-10-CM | POA: Diagnosis present

## 2019-07-17 DIAGNOSIS — R5383 Other fatigue: Secondary | ICD-10-CM | POA: Insufficient documentation

## 2019-07-17 DIAGNOSIS — D696 Thrombocytopenia, unspecified: Secondary | ICD-10-CM | POA: Diagnosis not present

## 2019-07-17 DIAGNOSIS — Z7901 Long term (current) use of anticoagulants: Secondary | ICD-10-CM | POA: Insufficient documentation

## 2019-07-17 DIAGNOSIS — C189 Malignant neoplasm of colon, unspecified: Secondary | ICD-10-CM | POA: Diagnosis present

## 2019-07-17 DIAGNOSIS — Z79899 Other long term (current) drug therapy: Secondary | ICD-10-CM | POA: Diagnosis not present

## 2019-07-17 DIAGNOSIS — Z5189 Encounter for other specified aftercare: Secondary | ICD-10-CM | POA: Diagnosis not present

## 2019-07-17 DIAGNOSIS — I4891 Unspecified atrial fibrillation: Secondary | ICD-10-CM | POA: Insufficient documentation

## 2019-07-17 DIAGNOSIS — C787 Secondary malignant neoplasm of liver and intrahepatic bile duct: Secondary | ICD-10-CM | POA: Insufficient documentation

## 2019-07-17 DIAGNOSIS — Z95828 Presence of other vascular implants and grafts: Secondary | ICD-10-CM

## 2019-07-17 LAB — CBC WITH DIFFERENTIAL (CANCER CENTER ONLY)
Abs Immature Granulocytes: 0.38 10*3/uL — ABNORMAL HIGH (ref 0.00–0.07)
Basophils Absolute: 0.1 10*3/uL (ref 0.0–0.1)
Basophils Relative: 1 %
Eosinophils Absolute: 0.2 10*3/uL (ref 0.0–0.5)
Eosinophils Relative: 2 %
HCT: 30.7 % — ABNORMAL LOW (ref 39.0–52.0)
Hemoglobin: 9.4 g/dL — ABNORMAL LOW (ref 13.0–17.0)
Immature Granulocytes: 6 %
Lymphocytes Relative: 16 %
Lymphs Abs: 1 10*3/uL (ref 0.7–4.0)
MCH: 30.7 pg (ref 26.0–34.0)
MCHC: 30.6 g/dL (ref 30.0–36.0)
MCV: 100.3 fL — ABNORMAL HIGH (ref 80.0–100.0)
Monocytes Absolute: 0.4 10*3/uL (ref 0.1–1.0)
Monocytes Relative: 7 %
Neutro Abs: 4.3 10*3/uL (ref 1.7–7.7)
Neutrophils Relative %: 68 %
Platelet Count: 76 10*3/uL — ABNORMAL LOW (ref 150–400)
RBC: 3.06 MIL/uL — ABNORMAL LOW (ref 4.22–5.81)
RDW: 18.6 % — ABNORMAL HIGH (ref 11.5–15.5)
WBC Count: 6.3 10*3/uL (ref 4.0–10.5)
nRBC: 0.8 % — ABNORMAL HIGH (ref 0.0–0.2)

## 2019-07-17 LAB — CMP (CANCER CENTER ONLY)
ALT: 29 U/L (ref 0–44)
AST: 30 U/L (ref 15–41)
Albumin: 3.4 g/dL — ABNORMAL LOW (ref 3.5–5.0)
Alkaline Phosphatase: 176 U/L — ABNORMAL HIGH (ref 38–126)
Anion gap: 6 (ref 5–15)
BUN: 15 mg/dL (ref 8–23)
CO2: 31 mmol/L (ref 22–32)
Calcium: 8.9 mg/dL (ref 8.9–10.3)
Chloride: 106 mmol/L (ref 98–111)
Creatinine: 1.09 mg/dL (ref 0.61–1.24)
GFR, Est AFR Am: >60 mL/min (ref >60)
GFR, Estimated: >60 mL/min (ref >60)
Glucose, Bld: 129 mg/dL — ABNORMAL HIGH (ref 70–99)
Potassium: 3.6 mmol/L (ref 3.5–5.1)
Sodium: 143 mmol/L (ref 135–145)
Total Bilirubin: 1.2 mg/dL (ref 0.3–1.2)
Total Protein: 5.4 g/dL — ABNORMAL LOW (ref 6.5–8.1)

## 2019-07-17 LAB — LACTATE DEHYDROGENASE: LDH: 415 U/L — ABNORMAL HIGH (ref 98–192)

## 2019-07-17 LAB — CEA (IN HOUSE-CHCC): CEA (CHCC-In House): 76.48 ng/mL — ABNORMAL HIGH (ref 0.00–5.00)

## 2019-07-17 MED ORDER — HEPARIN SOD (PORK) LOCK FLUSH 100 UNIT/ML IV SOLN
500.0000 [IU] | Freq: Once | INTRAVENOUS | Status: AC
  Administered 2019-07-17: 500 [IU] via INTRAVENOUS
  Filled 2019-07-17: qty 5

## 2019-07-17 MED ORDER — SODIUM CHLORIDE 0.9% FLUSH
10.0000 mL | INTRAVENOUS | Status: AC | PRN
  Administered 2019-07-17: 10 mL via INTRAVENOUS
  Filled 2019-07-17: qty 10

## 2019-07-17 NOTE — Progress Notes (Signed)
Hematology and Oncology Follow Up Visit  RAMESSES CRAMPTON [id number] [birth year] 74 y.o. 07/17/2019   Principle Diagnosis:  Metastatic colon cancer - progressive  Past Therapy: Status post RFA to the liver-11/21/2017  S/P Y-90 x 2 -- last therapy on 08/27/2018  Current Therapy: FOLFIRI -- started on 01/09/2019, s/p cycle 9   Interim History:  Mr. Melendrez is here today for follow-up and treatment. He has noted increased fatigued after each cycle of treatment. His upper legs are weak at times. He was able to weed eat his yard earlier this week with frequent breaks.  His platelet count dropped from 130 to 76. He does bruise easily on coumadin and aspirin for atrial fib. He has not noted any blood loss. No petechiae.  Hgb 9.4, MCV 100, WBC count is 6.3.  CEA in June was 91.  No fever, chills, n/v, cough, rash, dizziness, SOB, chest pain, palpitations, abdominal pain or changes in bowel or bladder habits.  The swelling in his feet and ankles is stable. He notes that it improves when he props his feet up. Pedal pulses are 2+.  No numbness or tingling in his extremities at this time.  No falls or syncopal episodes to report.  He states that he is eating well and hydrating properly. His weight is stable at 256 lbs.   ECOG Performance Status: 1 - Symptomatic but completely ambulatory  Medications:  Allergies as of 07/17/2019   No Known Allergies     Medication List       Accurate as of July 17, 2019  9:25 AM. If you have any questions, ask your nurse or doctor.        acyclovir 400 MG tablet Commonly known as: ZOVIRAX TAKE 1 TABLET BY MOUTH TWICE A DAY   allopurinol 300 MG tablet Commonly known as: ZYLOPRIM Take 300 mg by mouth every morning.   atorvastatin 40 MG tablet Commonly known as: LIPITOR Take 40 mg by mouth daily.   carvedilol 6.25 MG tablet Commonly known as: COREG TAKE 1 TABLET (6.25 MG TOTAL) BY MOUTH 2 TIMES DAILY WITH MEALS.   [employer]S Aspirin Adult Low Dose  81 MG chewable tablet Generic drug: aspirin Chew 81 mg by mouth daily.   [facility]S Stool Softener/Laxative 8.6-50 MG tablet Generic drug: senna-docusate TAKE 2 TABLETS EVERY EVENING   dexamethasone 4 MG tablet Commonly known as: DECADRON Take 2 tablets (8 mg total) by mouth daily. Start the day after chemo for 2 days.   ferrous sulfate 325 (65 FE) MG tablet Take 325 mg by mouth daily.   furosemide 20 MG tablet Commonly known as: LASIX Take 20 mg by mouth daily.   hydrocortisone 10 MG tablet Commonly known as: CORTEF Take 5-15 mg by mouth See admin instructions. Takes 15 mg in am and 5mg  in afternoon.   Klor-Con 10 10 MEQ tablet Generic drug: potassium chloride Take 20 mEq by mouth daily.   levothyroxine 88 MCG tablet Commonly known as: SYNTHROID Take 88 mcg by mouth daily before breakfast.   lidocaine-prilocaine cream Commonly known as: EMLA Apply to affected area once   loperamide 2 MG tablet Commonly known as: Imodium A-D Take 1 tablet (2 mg total) by mouth 4 (four) times daily as needed. Take 2 at diarrhea onset , then 1 every 2hr until 12hrs with no BM. May take 2 every 4hrs at night. If diarrhea recurs repeat.   LORazepam 1 MG tablet Commonly known as: Ativan Take 1 tablet (1 mg total) by mouth every  6 (six) hours as needed (NAUSEA).   NASAL [place] NA Place 1 spray into the nose daily.   ondansetron 8 MG tablet Commonly known as: ZOFRAN Take 1 tablet (8 mg total) by mouth 2 (two) times daily as needed for refractory nausea / vomiting. Start on day 3 after chemotherapy.   pantoprazole 40 MG tablet Commonly known as: PROTONIX Take 40 mg by mouth every morning.   polyvinyl alcohol 1.4 % ophthalmic solution Commonly known as: LIQUIFILM TEARS Place 1 drop into both eyes daily as needed for dry eyes.   prochlorperazine 10 MG tablet Commonly known as: COMPAZINE Take 1 tablet (10 mg total) by mouth every 6 (six) hours as needed (NAUSEA).   testosterone 4 MG/24HR  Pt24 patch Commonly known as: ANDRODERM Place 1 patch onto the skin daily.   warfarin 5 MG tablet Commonly known as: COUMADIN Take 5 mg by mouth daily.       Allergies: No Known Allergies  Past Medical History, Surgical history, Social history, and Family History were reviewed and updated.  Review of Systems: All other 10 point review of systems is negative.   Physical Exam:  height is 5\' 10"  (1.778 m) and weight is 256 lb (116.1 kg). His oral temperature is 98.6 F (37 C). His blood pressure is 132/71 and his pulse is 61. His respiration is 18 and oxygen saturation is 100%.   Wt Readings from Last 3 Encounters:  07/17/19 256 lb (116.1 kg)  06/26/19 252 lb (114.3 kg)  05/28/19 257 lb (116.6 kg)    Ocular: Sclerae unicteric, pupils equal, round and reactive to light Ear-nose-throat: Oropharynx clear, dentition fair Lymphatic: No cervical or supraclavicular adenopathy Lungs no rales or rhonchi, good excursion bilaterally Heart regular rate and rhythm, no murmur appreciated Abd soft, nontender, positive bowel sounds, no liver or spleen tip palpated on exam, no fluid wave  MSK no focal spinal tenderness, no joint edema Neuro: non-focal, well-oriented, appropriate affect Breasts: Deferred   Lab Results  Component Value Date   WBC 6.3 07/17/2019   HGB 9.4 (L) 07/17/2019   HCT 30.7 (L) 07/17/2019   MCV 100.3 (H) 07/17/2019   PLT 76 (L) 07/17/2019   Lab Results  Component Value Date   FERRITIN 204 05/01/2019   IRON 55 05/01/2019   TIBC 225 05/01/2019   UIBC 170 05/01/2019   IRONPCTSAT 24 05/01/2019   Lab Results  Component Value Date   RBC 3.06 (L) 07/17/2019   No results found for: KPAFRELGTCHN, LAMBDASER, KAPLAMBRATIO No results found for: IGGSERUM, IGA, IGMSERUM No results found for: Ronnald Ramp, A1GS, A2GS, BETS, BETA2SER, GAMS, MSPIKE, SPEI   Chemistry      Component Value Date/Time   NA 144 06/26/2019 0900   K 3.6 06/26/2019 0900   CL 107  06/26/2019 0900   CO2 30 06/26/2019 0900   BUN 19 06/26/2019 0900   CREATININE 1.11 06/26/2019 0900      Component Value Date/Time   CALCIUM 8.8 (L) 06/26/2019 0900   ALKPHOS 187 (H) 06/26/2019 0900   AST 31 06/26/2019 0900   ALT 25 06/26/2019 0900   BILITOT 0.9 06/26/2019 0900       Impression and Plan: Mr. Biermann is a very pleasant 74 yo caucasian gentleman with metastatic colon cancer.  His platelet count has dipped back down to 76 and he has had some increased fatigue.  We will hold treatment today and give him a week off. He is in agreement with the plan.  We will  bring him back in next week and resume treatment if his platelets have improved.  He will contact our office with any questions or concerns.   Laverna Peace, NP 7/14/20219:25 AM

## 2019-07-17 NOTE — Addendum Note (Signed)
Addended by: Smiley Houseman F on: 07/17/2019 09:52 AM   Modules accepted: Orders, SmartSet

## 2019-07-17 NOTE — Telephone Encounter (Signed)
Appointments schedule calendar printed per 7/14 los

## 2019-07-18 ENCOUNTER — Telehealth: Payer: Self-pay

## 2019-07-18 NOTE — Telephone Encounter (Signed)
-----   Message from Eliezer Bottom, NP sent at 07/18/2019  9:11 AM EDT ----- Margit Banda!!! Looking better!!!! No scans needed at this time per Dr. Marin Olp :)  ----- Message ----- From: [name], MD Sent: 07/17/2019   4:31 PM EDT To: Eliezer Bottom, NP  Sarah:  the CEA is down a little.  Hold off on the scans!!!!  [name] ----- Message ----- From: Buel Ream, Lab In [place] Sent: 07/17/2019   9:22 AM EDT To: [name], MD

## 2019-07-18 NOTE — Telephone Encounter (Signed)
Called and informed patient of results and informed him no scan needed at this time per Dr.Ennever. Patient appreciative of call and denies any questions or concerns at this time.

## 2019-07-19 ENCOUNTER — Inpatient Hospital Stay: Payer: Medicare HMO

## 2019-07-22 ENCOUNTER — Inpatient Hospital Stay: Payer: Medicare HMO

## 2019-07-22 ENCOUNTER — Inpatient Hospital Stay (HOSPITAL_BASED_OUTPATIENT_CLINIC_OR_DEPARTMENT_OTHER): Payer: Medicare HMO | Admitting: Family

## 2019-07-22 ENCOUNTER — Encounter: Payer: Self-pay | Admitting: Family

## 2019-07-22 ENCOUNTER — Other Ambulatory Visit: Payer: Self-pay

## 2019-07-22 VITALS — BP 140/99 | HR 53 | Temp 98.3°F | Resp 18 | Ht 70.0 in | Wt 258.0 lb

## 2019-07-22 DIAGNOSIS — C189 Malignant neoplasm of colon, unspecified: Secondary | ICD-10-CM

## 2019-07-22 DIAGNOSIS — C787 Secondary malignant neoplasm of liver and intrahepatic bile duct: Secondary | ICD-10-CM

## 2019-07-22 DIAGNOSIS — Z5111 Encounter for antineoplastic chemotherapy: Secondary | ICD-10-CM | POA: Diagnosis not present

## 2019-07-22 LAB — CMP (CANCER CENTER ONLY)
ALT: 26 U/L (ref 0–44)
AST: 32 U/L (ref 15–41)
Albumin: 3.6 g/dL (ref 3.5–5.0)
Alkaline Phosphatase: 164 U/L — ABNORMAL HIGH (ref 38–126)
Anion gap: 6 (ref 5–15)
BUN: 16 mg/dL (ref 8–23)
CO2: 30 mmol/L (ref 22–32)
Calcium: 9.1 mg/dL (ref 8.9–10.3)
Chloride: 107 mmol/L (ref 98–111)
Creatinine: 1.1 mg/dL (ref 0.61–1.24)
GFR, Est AFR Am: >60 mL/min (ref >60)
GFR, Estimated: >60 mL/min (ref >60)
Glucose, Bld: 152 mg/dL — ABNORMAL HIGH (ref 70–99)
Potassium: 3.9 mmol/L (ref 3.5–5.1)
Sodium: 143 mmol/L (ref 135–145)
Total Bilirubin: 1 mg/dL (ref 0.3–1.2)
Total Protein: 5.7 g/dL — ABNORMAL LOW (ref 6.5–8.1)

## 2019-07-22 LAB — CBC WITH DIFFERENTIAL (CANCER CENTER ONLY)
Abs Immature Granulocytes: 0.3 10*3/uL — ABNORMAL HIGH (ref 0.00–0.07)
Basophils Absolute: 0.1 10*3/uL (ref 0.0–0.1)
Basophils Relative: 1 %
Eosinophils Absolute: 0.1 10*3/uL (ref 0.0–0.5)
Eosinophils Relative: 2 %
HCT: 31.2 % — ABNORMAL LOW (ref 39.0–52.0)
Hemoglobin: 9.5 g/dL — ABNORMAL LOW (ref 13.0–17.0)
Immature Granulocytes: 4 %
Lymphocytes Relative: 14 %
Lymphs Abs: 1 10*3/uL (ref 0.7–4.0)
MCH: 30.9 pg (ref 26.0–34.0)
MCHC: 30.4 g/dL (ref 30.0–36.0)
MCV: 101.6 fL — ABNORMAL HIGH (ref 80.0–100.0)
Monocytes Absolute: 0.5 10*3/uL (ref 0.1–1.0)
Monocytes Relative: 7 %
Neutro Abs: 5 10*3/uL (ref 1.7–7.7)
Neutrophils Relative %: 72 %
Platelet Count: 88 10*3/uL — ABNORMAL LOW (ref 150–400)
RBC: 3.07 MIL/uL — ABNORMAL LOW (ref 4.22–5.81)
RDW: 19.2 % — ABNORMAL HIGH (ref 11.5–15.5)
WBC Count: 7 10*3/uL (ref 4.0–10.5)
nRBC: 0.4 % — ABNORMAL HIGH (ref 0.0–0.2)

## 2019-07-22 MED ORDER — SODIUM CHLORIDE 0.9 % IV SOLN
5000.0000 mg | INTRAVENOUS | Status: DC
  Administered 2019-07-22: 5000 mg via INTRAVENOUS
  Filled 2019-07-22: qty 100

## 2019-07-22 MED ORDER — PALONOSETRON HCL INJECTION 0.25 MG/5ML
INTRAVENOUS | Status: AC
  Filled 2019-07-22: qty 5

## 2019-07-22 MED ORDER — SODIUM CHLORIDE 0.9 % IV SOLN
129.6000 mg/m2 | Freq: Once | INTRAVENOUS | Status: AC
  Administered 2019-07-22: 300 mg via INTRAVENOUS
  Filled 2019-07-22: qty 15

## 2019-07-22 MED ORDER — SODIUM CHLORIDE 0.9 % IV SOLN
10.0000 mg | Freq: Once | INTRAVENOUS | Status: AC
  Administered 2019-07-22: 10 mg via INTRAVENOUS
  Filled 2019-07-22: qty 1

## 2019-07-22 MED ORDER — SODIUM CHLORIDE 0.9 % IV SOLN
400.0000 mg/m2 | Freq: Once | INTRAVENOUS | Status: AC
  Administered 2019-07-22: 936 mg via INTRAVENOUS
  Filled 2019-07-22: qty 46.8

## 2019-07-22 MED ORDER — PALONOSETRON HCL INJECTION 0.25 MG/5ML
0.2500 mg | Freq: Once | INTRAVENOUS | Status: AC
  Administered 2019-07-22: 0.25 mg via INTRAVENOUS

## 2019-07-22 MED ORDER — SODIUM CHLORIDE 0.9 % IV SOLN
Freq: Once | INTRAVENOUS | Status: AC
  Filled 2019-07-22: qty 250

## 2019-07-22 MED ORDER — ATROPINE SULFATE 1 MG/ML IJ SOLN
0.5000 mg | Freq: Once | INTRAMUSCULAR | Status: AC | PRN
  Administered 2019-07-22: 0.5 mg via INTRAVENOUS

## 2019-07-22 MED ORDER — ATROPINE SULFATE 1 MG/ML IJ SOLN
INTRAMUSCULAR | Status: AC
  Filled 2019-07-22: qty 1

## 2019-07-22 NOTE — Patient Instructions (Signed)

## 2019-07-22 NOTE — Progress Notes (Signed)
Ok to treat with platelets of 88. Dr Marin Olp to reduce dose of Camptosar with today's cycle. dph

## 2019-07-22 NOTE — Patient Instructions (Signed)
Fluorouracil, 5-FU injection What is this medicine? FLUOROURACIL, 5-FU (flure oh YOOR a sil) is a chemotherapy drug. It slows the growth of cancer cells. This medicine is used to treat many types of cancer like breast cancer, colon or rectal cancer, pancreatic cancer, and stomach cancer. This medicine may be used for other purposes; ask your health care provider or pharmacist if you have questions. COMMON BRAND NAME(S): Adrucil What should I tell my health care provider before I take this medicine? They need to know if you have any of these conditions:  blood disorders  dihydropyrimidine dehydrogenase (DPD) deficiency  infection (especially a virus infection such as chickenpox, cold sores, or herpes)  kidney disease  liver disease  malnourished, poor nutrition  recent or ongoing radiation therapy  an unusual or allergic reaction to fluorouracil, other chemotherapy, other medicines, foods, dyes, or preservatives  pregnant or trying to get pregnant  breast-feeding How should I use this medicine? This drug is given as an infusion or injection into a vein. It is administered in a hospital or clinic by a specially trained health care professional. Talk to your pediatrician regarding the use of this medicine in children. Special care may be needed. Overdosage: If you think you have taken too much of this medicine contact a poison control center or emergency room at once. NOTE: This medicine is only for you. Do not share this medicine with others. What if I miss a dose? It is important not to miss your dose. Call your doctor or health care professional if you are unable to keep an appointment. What may interact with this medicine?  allopurinol  cimetidine  dapsone  digoxin  hydroxyurea  leucovorin  levamisole  medicines for seizures like ethotoin, fosphenytoin, phenytoin  medicines to increase blood counts like filgrastim, pegfilgrastim, sargramostim  medicines that  treat or prevent blood clots like warfarin, enoxaparin, and dalteparin  methotrexate  metronidazole  pyrimethamine  some other chemotherapy drugs like busulfan, cisplatin, estramustine, vinblastine  trimethoprim  trimetrexate  vaccines Talk to your doctor or health care professional before taking any of these medicines:  acetaminophen  aspirin  ibuprofen  ketoprofen  naproxen This list may not describe all possible interactions. Give your health care provider a list of all the medicines, herbs, non-prescription drugs, or dietary supplements you use. Also tell them if you smoke, drink alcohol, or use illegal drugs. Some items may interact with your medicine. What should I watch for while using this medicine? Visit your doctor for checks on your progress. This drug may make you feel generally unwell. This is not uncommon, as chemotherapy can affect healthy cells as well as cancer cells. Report any side effects. Continue your course of treatment even though you feel ill unless your doctor tells you to stop. In some cases, you may be given additional medicines to help with side effects. Follow all directions for their use. Call your doctor or health care professional for advice if you get a fever, chills or sore throat, or other symptoms of a cold or flu. Do not treat yourself. This drug decreases your body's ability to fight infections. Try to avoid being around people who are sick. This medicine may increase your risk to bruise or bleed. Call your doctor or health care professional if you notice any unusual bleeding. Be careful brushing and flossing your teeth or using a toothpick because you may get an infection or bleed more easily. If you have any dental work done, tell your dentist you are  receiving this medicine. Avoid taking products that contain aspirin, acetaminophen, ibuprofen, naproxen, or ketoprofen unless instructed by your doctor. These medicines may hide a fever. Do not  become pregnant while taking this medicine. Women should inform their doctor if they wish to become pregnant or think they might be pregnant. There is a potential for serious side effects to an unborn child. Talk to your health care professional or pharmacist for more information. Do not breast-feed an infant while taking this medicine. Men should inform their doctor if they wish to father a child. This medicine may lower sperm counts. Do not treat diarrhea with over the counter products. Contact your doctor if you have diarrhea that lasts more than 2 days or if it is severe and watery. This medicine can make you more sensitive to the sun. Keep out of the sun. If you cannot avoid being in the sun, wear protective clothing and use sunscreen. Do not use sun lamps or tanning beds/booths. What side effects may I notice from receiving this medicine? Side effects that you should report to your doctor or health care professional as soon as possible:  allergic reactions like skin rash, itching or hives, swelling of the face, lips, or tongue  low blood counts - this medicine may decrease the number of white blood cells, red blood cells and platelets. You may be at increased risk for infections and bleeding.  signs of infection - fever or chills, cough, sore throat, pain or difficulty passing urine  signs of decreased platelets or bleeding - bruising, pinpoint red spots on the skin, black, tarry stools, blood in the urine  signs of decreased red blood cells - unusually weak or tired, fainting spells, lightheadedness  breathing problems  changes in vision  chest pain  mouth sores  nausea and vomiting  pain, swelling, redness at site where injected  pain, tingling, numbness in the hands or feet  redness, swelling, or sores on hands or feet  stomach pain  unusual bleeding Side effects that usually do not require medical attention (report to your doctor or health care professional if they  continue or are bothersome):  changes in finger or toe nails  diarrhea  dry or itchy skin  hair loss  headache  loss of appetite  sensitivity of eyes to the light  stomach upset  unusually teary eyes This list may not describe all possible side effects. Call your doctor for medical advice about side effects. You may report side effects to FDA at 1-800-FDA-1088. Where should I keep my medicine? This drug is given in a hospital or clinic and will not be stored at home. NOTE: This sheet is a summary. It may not cover all possible information. If you have questions about this medicine, talk to your doctor, pharmacist, or health care provider.  2020 Elsevier/Gold Standard (2007-04-25 13:53:16) Leucovorin injection What is this medicine? LEUCOVORIN (loo koe VOR in) is used to prevent or treat the harmful effects of some medicines. This medicine is used to treat anemia caused by a low amount of folic acid in the body. It is also used with 5-fluorouracil (5-FU) to treat colon cancer. This medicine may be used for other purposes; ask your health care provider or pharmacist if you have questions. What should I tell my health care provider before I take this medicine? They need to know if you have any of these conditions:  anemia from low levels of vitamin B-12 in the blood  an unusual or allergic reaction to  leucovorin, folic acid, other medicines, foods, dyes, or preservatives  pregnant or trying to get pregnant  breast-feeding How should I use this medicine? This medicine is for injection into a muscle or into a vein. It is given by a health care professional in a hospital or clinic setting. Talk to your pediatrician regarding the use of this medicine in children. Special care may be needed. Overdosage: If you think you have taken too much of this medicine contact a poison control center or emergency room at once. NOTE: This medicine is only for you. Do not share this medicine with  others. What if I miss a dose? This does not apply. What may interact with this medicine?  capecitabine  fluorouracil  phenobarbital  phenytoin  primidone  trimethoprim-sulfamethoxazole This list may not describe all possible interactions. Give your health care provider a list of all the medicines, herbs, non-prescription drugs, or dietary supplements you use. Also tell them if you smoke, drink alcohol, or use illegal drugs. Some items may interact with your medicine. What should I watch for while using this medicine? Your condition will be monitored carefully while you are receiving this medicine. This medicine may increase the side effects of 5-fluorouracil, 5-FU. Tell your doctor or health care professional if you have diarrhea or mouth sores that do not get better or that get worse. What side effects may I notice from receiving this medicine? Side effects that you should report to your doctor or health care professional as soon as possible:  allergic reactions like skin rash, itching or hives, swelling of the face, lips, or tongue  breathing problems  fever, infection  mouth sores  unusual bleeding or bruising  unusually weak or tired Side effects that usually do not require medical attention (report to your doctor or health care professional if they continue or are bothersome):  constipation or diarrhea  loss of appetite  nausea, vomiting This list may not describe all possible side effects. Call your doctor for medical advice about side effects. You may report side effects to FDA at 1-800-FDA-1088. Where should I keep my medicine? This drug is given in a hospital or clinic and will not be stored at home. NOTE: This sheet is a summary. It may not cover all possible information. If you have questions about this medicine, talk to your doctor, pharmacist, or health care provider.  2020 Elsevier/Gold Standard (2007-06-26 16:50:29) Irinotecan injection What is this  medicine? IRINOTECAN (ir in oh TEE kan ) is a chemotherapy drug. It is used to treat colon and rectal cancer. This medicine may be used for other purposes; ask your health care provider or pharmacist if you have questions. COMMON BRAND NAME(S): Camptosar What should I tell my health care provider before I take this medicine? They need to know if you have any of these conditions:  dehydration  diarrhea  infection (especially a virus infection such as chickenpox, cold sores, or herpes)  liver disease  low blood counts, like low white cell, platelet, or red cell counts  low levels of calcium, magnesium, or potassium in the blood  recent or ongoing radiation therapy  an unusual or allergic reaction to irinotecan, other medicines, foods, dyes, or preservatives  pregnant or trying to get pregnant  breast-feeding How should I use this medicine? This drug is given as an infusion into a vein. It is administered in a hospital or clinic by a specially trained health care professional. Talk to your pediatrician regarding the use of this medicine   in children. Special care may be needed. Overdosage: If you think you have taken too much of this medicine contact a poison control center or emergency room at once. NOTE: This medicine is only for you. Do not share this medicine with others. What if I miss a dose? It is important not to miss your dose. Call your doctor or health care professional if you are unable to keep an appointment. What may interact with this medicine? This medicine may interact with the following medications:  antiviral medicines for HIV or AIDS  certain antibiotics like rifampin or rifabutin  certain medicines for fungal infections like itraconazole, ketoconazole, posaconazole, and voriconazole  certain medicines for seizures like carbamazepine, phenobarbital, phenotoin  clarithromycin  gemfibrozil  nefazodone  St. John's Wort This list may not describe all  possible interactions. Give your health care provider a list of all the medicines, herbs, non-prescription drugs, or dietary supplements you use. Also tell them if you smoke, drink alcohol, or use illegal drugs. Some items may interact with your medicine. What should I watch for while using this medicine? Your condition will be monitored carefully while you are receiving this medicine. You will need important blood work done while you are taking this medicine. This drug may make you feel generally unwell. This is not uncommon, as chemotherapy can affect healthy cells as well as cancer cells. Report any side effects. Continue your course of treatment even though you feel ill unless your doctor tells you to stop. In some cases, you may be given additional medicines to help with side effects. Follow all directions for their use. You may get drowsy or dizzy. Do not drive, use machinery, or do anything that needs mental alertness until you know how this medicine affects you. Do not stand or sit up quickly, especially if you are an older patient. This reduces the risk of dizzy or fainting spells. Call your health care professional for advice if you get a fever, chills, or sore throat, or other symptoms of a cold or flu. Do not treat yourself. This medicine decreases your body's ability to fight infections. Try to avoid being around people who are sick. Avoid taking products that contain aspirin, acetaminophen, ibuprofen, naproxen, or ketoprofen unless instructed by your doctor. These medicines may hide a fever. This medicine may increase your risk to bruise or bleed. Call your doctor or health care professional if you notice any unusual bleeding. Be careful brushing and flossing your teeth or using a toothpick because you may get an infection or bleed more easily. If you have any dental work done, tell your dentist you are receiving this medicine. Do not become pregnant while taking this medicine or for 6 months  after stopping it. Women should inform their health care professional if they wish to become pregnant or think they might be pregnant. Men should not father a child while taking this medicine and for 3 months after stopping it. There is potential for serious side effects to an unborn child. Talk to your health care professional for more information. Do not breast-feed an infant while taking this medicine or for 7 days after stopping it. This medicine has caused ovarian failure in some women. This medicine may make it more difficult to get pregnant. Talk to your health care professional if you are concerned about your fertility. This medicine has caused decreased sperm counts in some men. This may make it more difficult to father a child. Talk to your health care professional if you   are concerned about your fertility. What side effects may I notice from receiving this medicine? Side effects that you should report to your doctor or health care professional as soon as possible:  allergic reactions like skin rash, itching or hives, swelling of the face, lips, or tongue  chest pain  diarrhea  flushing, runny nose, sweating during infusion  low blood counts - this medicine may decrease the number of white blood cells, red blood cells and platelets. You may be at increased risk for infections and bleeding.  nausea, vomiting  pain, swelling, warmth in the leg  signs of decreased platelets or bleeding - bruising, pinpoint red spots on the skin, black, tarry stools, blood in the urine  signs of infection - fever or chills, cough, sore throat, pain or difficulty passing urine  signs of decreased red blood cells - unusually weak or tired, fainting spells, lightheadedness Side effects that usually do not require medical attention (report to your doctor or health care professional if they continue or are bothersome):  constipation  hair loss  headache  loss of appetite  mouth sores  stomach  pain This list may not describe all possible side effects. Call your doctor for medical advice about side effects. You may report side effects to FDA at 1-800-FDA-1088. Where should I keep my medicine? This drug is given in a hospital or clinic and will not be stored at home. NOTE: This sheet is a summary. It may not cover all possible information. If you have questions about this medicine, talk to your doctor, pharmacist, or health care provider.  2020 Elsevier/Gold Standard (2018-02-09 10:09:17)  

## 2019-07-22 NOTE — Progress Notes (Signed)
Hematology and Oncology Follow Up Visit  Zachary Willis [id number] [birth year] 74 y.o. 07/22/2019   Principle Diagnosis:  Metastatic colon cancer - progressive  Past Therapy: Status post RFA to the liver-11/21/2017  S/P Y-90 x 2 -- last therapy on 08/27/2018  Current Therapy: FOLFIRI -- started on 01/09/2019, s/p cycle 9   Interim History:  Zachary Willis is here today for follow-up and to resume treatment. His Hgb is 9.5, WBC count 7.0 and platelets 88.  I spoke with Zachary Willis and he will reduce his irinotecan dose and took out the 5FU bolus.   Hopefully this helps with his thrombocytopenia.  He has not noted any blood loss. No petechiae.  He states that he feels good today. Fatigue seems better.  No fever, chills, n/v, cough, rash, dizziness, SOB, chest pain, palpitations, abdominal pain or changes in bowel or bladder habits.  Swelling in his feet and ankles is unchanged. Pedal pulses are 2+.  No numbness or tingling in his extremities.  No falls or syncope.  He is still eating well and staying hydrated. His weight is stable.   ECOG Performance Status: 1 - Symptomatic but completely ambulatory  Medications:  Allergies as of 07/22/2019   No Known Allergies     Medication List       Accurate as of July 22, 2019 10:39 AM. If you have any questions, ask your nurse or doctor.        acyclovir 400 MG tablet Commonly known as: ZOVIRAX TAKE 1 TABLET BY MOUTH TWICE A DAY   allopurinol 300 MG tablet Commonly known as: ZYLOPRIM Take 300 mg by mouth every morning.   atorvastatin 40 MG tablet Commonly known as: LIPITOR Take 40 mg by mouth daily.   carvedilol 6.25 MG tablet Commonly known as: COREG TAKE 1 TABLET (6.25 MG TOTAL) BY MOUTH 2 TIMES DAILY WITH MEALS.   [employer]S Aspirin Adult Low Dose 81 MG chewable tablet Generic drug: aspirin Chew 81 mg by mouth daily.   [facility]S Stool Softener/Laxative 8.6-50 MG tablet Generic drug: senna-docusate TAKE 2 TABLETS EVERY EVENING    dexamethasone 4 MG tablet Commonly known as: DECADRON Take 2 tablets (8 mg total) by mouth daily. Start the day after chemo for 2 days.   ferrous sulfate 325 (65 FE) MG tablet Take 325 mg by mouth daily.   furosemide 20 MG tablet Commonly known as: LASIX Take 20 mg by mouth daily.   hydrocortisone 10 MG tablet Commonly known as: CORTEF Take 5-15 mg by mouth See admin instructions. Takes 15 mg in am and 5mg  in afternoon.   Klor-Con 10 10 MEQ tablet Generic drug: potassium chloride Take 20 mEq by mouth daily.   levothyroxine 88 MCG tablet Commonly known as: SYNTHROID Take 88 mcg by mouth daily before breakfast.   lidocaine-prilocaine cream Commonly known as: EMLA Apply to affected area once   loperamide 2 MG tablet Commonly known as: Imodium A-D Take 1 tablet (2 mg total) by mouth 4 (four) times daily as needed. Take 2 at diarrhea onset , then 1 every 2hr until 12hrs with no BM. May take 2 every 4hrs at night. If diarrhea recurs repeat.   LORazepam 1 MG tablet Commonly known as: Ativan Take 1 tablet (1 mg total) by mouth every 6 (six) hours as needed (NAUSEA).   NASAL [place] NA Place 1 spray into the nose daily.   ondansetron 8 MG tablet Commonly known as: ZOFRAN Take 1 tablet (8 mg total) by mouth 2 (two) times  daily as needed for refractory nausea / vomiting. Start on day 3 after chemotherapy.   pantoprazole 40 MG tablet Commonly known as: PROTONIX Take 40 mg by mouth every morning.   polyvinyl alcohol 1.4 % ophthalmic solution Commonly known as: LIQUIFILM TEARS Place 1 drop into both eyes daily as needed for dry eyes.   prochlorperazine 10 MG tablet Commonly known as: COMPAZINE Take 1 tablet (10 mg total) by mouth every 6 (six) hours as needed (NAUSEA).   testosterone 4 MG/24HR Pt24 patch Commonly known as: ANDRODERM Place 1 patch onto the skin daily.   warfarin 5 MG tablet Commonly known as: COUMADIN Take 5 mg by mouth daily.       Allergies: No  Known Allergies  Past Medical History, Surgical history, Social history, and Family History were reviewed and updated.  Review of Systems: All other 10 point review of systems is negative.   Physical Exam:  height is 5\' 10"  (1.778 m) and weight is 258 lb (117 kg). His oral temperature is 98.3 F (36.8 C). His blood pressure is 140/99 (abnormal) and his pulse is 53 (abnormal). His respiration is 18 and oxygen saturation is 100%.   Wt Readings from Last 3 Encounters:  07/22/19 258 lb (117 kg)  07/17/19 256 lb (116.1 kg)  06/26/19 252 lb (114.3 kg)    Ocular: Sclerae unicteric, pupils equal, round and reactive to light Ear-nose-throat: Oropharynx clear, dentition fair Lymphatic: No cervical or supraclavicular adenopathy Lungs no rales or rhonchi, good excursion bilaterally Heart regular rate and rhythm, no murmur appreciated Abd soft, nontender, positive bowel sounds, no liver or spleen tip palpated on exam, no fluid wave  MSK no focal spinal tenderness, no joint edema Neuro: non-focal, well-oriented, appropriate affect Breasts: Deferred   Lab Results  Component Value Date   WBC 7.0 07/22/2019   HGB 9.5 (L) 07/22/2019   HCT 31.2 (L) 07/22/2019   MCV 101.6 (H) 07/22/2019   PLT 88 (L) 07/22/2019   Lab Results  Component Value Date   FERRITIN 204 05/01/2019   IRON 55 05/01/2019   TIBC 225 05/01/2019   UIBC 170 05/01/2019   IRONPCTSAT 24 05/01/2019   Lab Results  Component Value Date   RBC 3.07 (L) 07/22/2019   No results found for: KPAFRELGTCHN, LAMBDASER, KAPLAMBRATIO No results found for: IGGSERUM, IGA, IGMSERUM No results found for: Ronnald Ramp, A1GS, A2GS, Tillman Sers, SPEI   Chemistry      Component Value Date/Time   NA 143 07/22/2019 0950   K 3.9 07/22/2019 0950   CL 107 07/22/2019 0950   CO2 30 07/22/2019 0950   BUN 16 07/22/2019 0950   CREATININE 1.10 07/22/2019 0950      Component Value Date/Time   CALCIUM 9.1 07/22/2019  0950   ALKPHOS 164 (H) 07/22/2019 0950   AST 32 07/22/2019 0950   ALT 26 07/22/2019 0950   BILITOT 1.0 07/22/2019 0950       Impression and Plan: Zachary Willis is a very pleasant 74 yo caucasian gentleman with metastatic colon cancer.  CEA was down slightly ay 76. No scans needed at this time per Zachary Willis.  His platelet count is slightly improved today at 88.  Zachary Willis did reduce his irinotecan dose and eliminated the 5FU bolus from his plan.  We will proceed with treatment today as planned.  We will see him again in 2 weeks.  He will contact our office with any questions or concerns.    Laverna Peace, NP  7/19/202110:39 AM

## 2019-07-24 ENCOUNTER — Other Ambulatory Visit: Payer: Self-pay

## 2019-07-24 ENCOUNTER — Inpatient Hospital Stay: Payer: Medicare HMO

## 2019-07-24 VITALS — BP 140/81 | HR 78 | Temp 98.7°F | Resp 18

## 2019-07-24 DIAGNOSIS — C189 Malignant neoplasm of colon, unspecified: Secondary | ICD-10-CM

## 2019-07-24 DIAGNOSIS — Z5111 Encounter for antineoplastic chemotherapy: Secondary | ICD-10-CM | POA: Diagnosis not present

## 2019-07-24 DIAGNOSIS — C787 Secondary malignant neoplasm of liver and intrahepatic bile duct: Secondary | ICD-10-CM

## 2019-07-24 MED ORDER — PEGFILGRASTIM-JMDB 6 MG/0.6ML ~~LOC~~ SOSY
6.0000 mg | PREFILLED_SYRINGE | Freq: Once | SUBCUTANEOUS | Status: AC
  Administered 2019-07-24: 6 mg via SUBCUTANEOUS

## 2019-07-24 MED ORDER — PEGFILGRASTIM-JMDB 6 MG/0.6ML ~~LOC~~ SOSY
PREFILLED_SYRINGE | SUBCUTANEOUS | Status: AC
  Filled 2019-07-24: qty 0.6

## 2019-07-24 MED ORDER — HEPARIN SOD (PORK) LOCK FLUSH 100 UNIT/ML IV SOLN
500.0000 [IU] | Freq: Once | INTRAVENOUS | Status: AC | PRN
  Administered 2019-07-24: 500 [IU]
  Filled 2019-07-24: qty 5

## 2019-07-24 MED ORDER — SODIUM CHLORIDE 0.9% FLUSH
10.0000 mL | INTRAVENOUS | Status: DC | PRN
  Administered 2019-07-24: 10 mL
  Filled 2019-07-24: qty 10

## 2019-07-24 NOTE — Patient Instructions (Signed)

## 2019-08-05 ENCOUNTER — Other Ambulatory Visit: Payer: Medicare HMO

## 2019-08-05 ENCOUNTER — Ambulatory Visit: Payer: Medicare HMO

## 2019-08-05 ENCOUNTER — Ambulatory Visit: Payer: Medicare HMO | Admitting: Hematology & Oncology

## 2019-08-13 ENCOUNTER — Other Ambulatory Visit: Payer: Self-pay

## 2019-08-13 ENCOUNTER — Encounter: Payer: Self-pay | Admitting: Hematology & Oncology

## 2019-08-13 ENCOUNTER — Inpatient Hospital Stay: Payer: Medicare HMO | Attending: Hematology & Oncology

## 2019-08-13 ENCOUNTER — Inpatient Hospital Stay: Payer: Medicare HMO

## 2019-08-13 ENCOUNTER — Inpatient Hospital Stay (HOSPITAL_BASED_OUTPATIENT_CLINIC_OR_DEPARTMENT_OTHER): Payer: Medicare HMO | Admitting: Hematology & Oncology

## 2019-08-13 VITALS — BP 136/80 | HR 57 | Temp 98.5°F | Resp 18 | Ht 70.0 in | Wt 257.0 lb

## 2019-08-13 DIAGNOSIS — Z79899 Other long term (current) drug therapy: Secondary | ICD-10-CM | POA: Diagnosis not present

## 2019-08-13 DIAGNOSIS — C189 Malignant neoplasm of colon, unspecified: Secondary | ICD-10-CM

## 2019-08-13 DIAGNOSIS — C787 Secondary malignant neoplasm of liver and intrahepatic bile duct: Secondary | ICD-10-CM | POA: Diagnosis not present

## 2019-08-13 DIAGNOSIS — Z5111 Encounter for antineoplastic chemotherapy: Secondary | ICD-10-CM | POA: Diagnosis present

## 2019-08-13 DIAGNOSIS — Z5189 Encounter for other specified aftercare: Secondary | ICD-10-CM | POA: Diagnosis not present

## 2019-08-13 LAB — CBC WITH DIFFERENTIAL (CANCER CENTER ONLY)
Abs Immature Granulocytes: 0.49 10*3/uL — ABNORMAL HIGH (ref 0.00–0.07)
Basophils Absolute: 0.1 10*3/uL (ref 0.0–0.1)
Basophils Relative: 1 %
Eosinophils Absolute: 0.1 10*3/uL (ref 0.0–0.5)
Eosinophils Relative: 2 %
HCT: 31.6 % — ABNORMAL LOW (ref 39.0–52.0)
Hemoglobin: 9.6 g/dL — ABNORMAL LOW (ref 13.0–17.0)
Immature Granulocytes: 8 %
Lymphocytes Relative: 17 %
Lymphs Abs: 1 10*3/uL (ref 0.7–4.0)
MCH: 30.4 pg (ref 26.0–34.0)
MCHC: 30.4 g/dL (ref 30.0–36.0)
MCV: 100 fL (ref 80.0–100.0)
Monocytes Absolute: 0.5 10*3/uL (ref 0.1–1.0)
Monocytes Relative: 8 %
Neutro Abs: 4.1 10*3/uL (ref 1.7–7.7)
Neutrophils Relative %: 64 %
Platelet Count: 70 10*3/uL — ABNORMAL LOW (ref 150–400)
RBC: 3.16 MIL/uL — ABNORMAL LOW (ref 4.22–5.81)
RDW: 17.8 % — ABNORMAL HIGH (ref 11.5–15.5)
WBC Count: 6.3 10*3/uL (ref 4.0–10.5)
nRBC: 0.3 % — ABNORMAL HIGH (ref 0.0–0.2)

## 2019-08-13 LAB — CMP (CANCER CENTER ONLY)
ALT: 32 U/L (ref 0–44)
AST: 35 U/L (ref 15–41)
Albumin: 3.5 g/dL (ref 3.5–5.0)
Alkaline Phosphatase: 187 U/L — ABNORMAL HIGH (ref 38–126)
Anion gap: 7 (ref 5–15)
BUN: 16 mg/dL (ref 8–23)
CO2: 31 mmol/L (ref 22–32)
Calcium: 9.4 mg/dL (ref 8.9–10.3)
Chloride: 106 mmol/L (ref 98–111)
Creatinine: 1.22 mg/dL (ref 0.61–1.24)
GFR, Est AFR Am: >60 mL/min (ref >60)
GFR, Estimated: 59 mL/min — ABNORMAL LOW (ref >60)
Glucose, Bld: 156 mg/dL — ABNORMAL HIGH (ref 70–99)
Potassium: 3.7 mmol/L (ref 3.5–5.1)
Sodium: 144 mmol/L (ref 135–145)
Total Bilirubin: 1.1 mg/dL (ref 0.3–1.2)
Total Protein: 5.7 g/dL — ABNORMAL LOW (ref 6.5–8.1)

## 2019-08-13 LAB — LACTATE DEHYDROGENASE: LDH: 482 U/L — ABNORMAL HIGH (ref 98–192)

## 2019-08-13 LAB — CEA (IN HOUSE-CHCC): CEA (CHCC-In House): 88.92 ng/mL — ABNORMAL HIGH (ref 0.00–5.00)

## 2019-08-13 MED ORDER — ATROPINE SULFATE 1 MG/ML IJ SOLN
INTRAMUSCULAR | Status: AC
  Filled 2019-08-13: qty 1

## 2019-08-13 MED ORDER — SODIUM CHLORIDE 0.9 % IV SOLN
400.0000 mg/m2 | Freq: Once | INTRAVENOUS | Status: AC
  Administered 2019-08-13: 936 mg via INTRAVENOUS
  Filled 2019-08-13: qty 46.8

## 2019-08-13 MED ORDER — SODIUM CHLORIDE 0.9 % IV SOLN
10.0000 mg | Freq: Once | INTRAVENOUS | Status: AC
  Administered 2019-08-13: 10 mg via INTRAVENOUS
  Filled 2019-08-13: qty 10

## 2019-08-13 MED ORDER — SODIUM CHLORIDE 0.9 % IV SOLN
Freq: Once | INTRAVENOUS | Status: AC
  Filled 2019-08-13: qty 250

## 2019-08-13 MED ORDER — SODIUM CHLORIDE 0.9 % IV SOLN
2140.0000 mg/m2 | INTRAVENOUS | Status: DC
  Administered 2019-08-13: 5000 mg via INTRAVENOUS
  Filled 2019-08-13: qty 100

## 2019-08-13 MED ORDER — PALONOSETRON HCL INJECTION 0.25 MG/5ML
INTRAVENOUS | Status: AC
  Filled 2019-08-13: qty 5

## 2019-08-13 MED ORDER — ATROPINE SULFATE 1 MG/ML IJ SOLN
0.5000 mg | Freq: Once | INTRAMUSCULAR | Status: AC | PRN
  Administered 2019-08-13: 0.5 mg via INTRAVENOUS

## 2019-08-13 MED ORDER — PALONOSETRON HCL INJECTION 0.25 MG/5ML
0.2500 mg | Freq: Once | INTRAVENOUS | Status: AC
  Administered 2019-08-13: 0.25 mg via INTRAVENOUS

## 2019-08-13 MED ORDER — SODIUM CHLORIDE 0.9 % IV SOLN
129.6000 mg/m2 | Freq: Once | INTRAVENOUS | Status: AC
  Administered 2019-08-13: 300 mg via INTRAVENOUS
  Filled 2019-08-13: qty 15

## 2019-08-13 NOTE — Patient Instructions (Signed)
[facility] Discharge Instructions for Patients Receiving Chemotherapy  Today you received the following chemotherapy agents Irinotecan, 5FU  To help prevent nausea and vomiting after your treatment, we encourage you to take your nausea medication    If you develop nausea and vomiting that is not controlled by your nausea medication, call the clinic.   BELOW ARE SYMPTOMS THAT SHOULD BE REPORTED IMMEDIATELY:  *FEVER GREATER THAN 100.5 F  *CHILLS WITH OR WITHOUT FEVER  NAUSEA AND VOMITING THAT IS NOT CONTROLLED WITH YOUR NAUSEA MEDICATION  *UNUSUAL SHORTNESS OF BREATH  *UNUSUAL BRUISING OR BLEEDING  TENDERNESS IN MOUTH AND THROAT WITH OR WITHOUT PRESENCE OF ULCERS  *URINARY PROBLEMS  *BOWEL PROBLEMS  UNUSUAL RASH Items with * indicate a potential emergency and should be followed up as soon as possible.  Feel free to call the clinic should you have any questions or concerns. The clinic phone number is [phone].  Please show the [facility] at check-in to the Emergency Department and triage nurse.

## 2019-08-13 NOTE — Progress Notes (Signed)
Hematology and Oncology Follow Up Visit  ALDINE GRAINGER [id number] May 14, 1945 74 y.o. 08/13/2019   Principle Diagnosis:  Metastatic colon cancer - progressive  Past Therapy: Status post RFA to the liver-11/21/2017  S/P Y-90 x 2 -- last therapy on 08/27/2018  Current Therapy: FOLFIRI -- started on 01/09/2019, s/p cycle #10   Interim History:  [name] is here today for follow-up.  He feels well.  He really has had no specific complaint since we last saw him.  He has had no bleeding.  He is on Coumadin.  He has had no issues with the atrial fibrillation.  He has had no palpitations.  He has had no dizziness or near syncope.  He has had no problems with bowels or bladder.  There has been no nausea or vomiting.  He has had no mouth sores.  Has had no dysphagia or odynophagia.  There is been no leg swelling.  He is trying to stay active.  His appetite is been doing okay.  His last CEA level was holding steady at 76.  This actually is a little bit better.  Overall, I would say his performance status is ECOG 1.    Medications:  Allergies as of 08/13/2019   No Known Allergies     Medication List       Accurate as of August 13, 2019  9:28 AM. If you have any questions, ask your nurse or doctor.        acyclovir 400 MG tablet Commonly known as: ZOVIRAX TAKE 1 TABLET BY MOUTH TWICE A DAY   allopurinol 300 MG tablet Commonly known as: ZYLOPRIM Take 300 mg by mouth every morning.   atorvastatin 40 MG tablet Commonly known as: LIPITOR Take 40 mg by mouth daily.   carvedilol 6.25 MG tablet Commonly known as: COREG TAKE 1 TABLET (6.25 MG TOTAL) BY MOUTH 2 TIMES DAILY WITH MEALS.   [facility] Aspirin Adult Low Dose 81 MG chewable tablet Generic drug: aspirin Chew 81 mg by mouth daily.   [facility] Stool Softener/Laxative 8.6-50 MG tablet Generic drug: senna-docusate TAKE 2 TABLETS EVERY EVENING   dexamethasone 4 MG tablet Commonly known as: DECADRON Take 2 tablets (8 mg  total) by mouth daily. Start the day after chemo for 2 days.   ferrous sulfate 325 (65 FE) MG tablet Take 325 mg by mouth daily.   furosemide 40 MG tablet Commonly known as: LASIX Take 40 mg by mouth daily. What changed: Another medication with the same name was removed. Continue taking this medication, and follow the directions you see here. Changed by: [name], MD   hydrocortisone 10 MG tablet Commonly known as: CORTEF Take 5-15 mg by mouth See admin instructions. Takes 15 mg in am and 5mg  in afternoon.   Klor-Con 10 10 MEQ tablet Generic drug: potassium chloride Take 20 mEq by mouth daily.   levothyroxine 88 MCG tablet Commonly known as: SYNTHROID Take 88 mcg by mouth daily before breakfast.   lidocaine-prilocaine cream Commonly known as: EMLA Apply to affected area once   loperamide 2 MG tablet Commonly known as: Imodium A-D Take 1 tablet (2 mg total) by mouth 4 (four) times daily as needed. Take 2 at diarrhea onset , then 1 every 2hr until 12hrs with no BM. May take 2 every 4hrs at night. If diarrhea recurs repeat.   LORazepam 1 MG tablet Commonly known as: Ativan Take 1 tablet (1 mg total) by mouth every 6 (six) hours as needed (NAUSEA).  NASAL [place] NA Place 1 spray into the nose daily.   ondansetron 8 MG tablet Commonly known as: ZOFRAN Take 1 tablet (8 mg total) by mouth 2 (two) times daily as needed for refractory nausea / vomiting. Start on day 3 after chemotherapy.   pantoprazole 40 MG tablet Commonly known as: PROTONIX Take 40 mg by mouth every morning.   polyvinyl alcohol 1.4 % ophthalmic solution Commonly known as: LIQUIFILM TEARS Place 1 drop into both eyes daily as needed for dry eyes.   prochlorperazine 10 MG tablet Commonly known as: COMPAZINE Take 1 tablet (10 mg total) by mouth every 6 (six) hours as needed (NAUSEA).   testosterone 4 MG/24HR Pt24 patch Commonly known as: ANDRODERM Place 1 patch onto the skin daily.   warfarin 5 MG  tablet Commonly known as: COUMADIN Take 5 mg by mouth daily.       Allergies: No Known Allergies  Past Medical History, Surgical history, Social history, and Family History were reviewed and updated.  Review of Systems: Review of Systems  Constitutional: Negative.   HENT: Negative.   Eyes: Negative.   Respiratory: Negative.   Cardiovascular: Positive for palpitations.  Gastrointestinal: Negative.   Genitourinary: Negative.   Musculoskeletal: Negative.   Skin: Negative.   Neurological: Negative.   Endo/Heme/Allergies: Negative.   Psychiatric/Behavioral: Negative.       Physical Exam:  height is 5\' 10"  (1.778 m) and weight is 257 lb 0.6 oz (116.6 kg). His oral temperature is 98.5 F (36.9 C). His blood pressure is 136/80 and his pulse is 57 (abnormal). His respiration is 18 and oxygen saturation is 100%.   Wt Readings from Last 3 Encounters:  08/13/19 257 lb 0.6 oz (116.6 kg)  07/22/19 258 lb (117 kg)  07/17/19 256 lb (116.1 kg)    Physical Exam Vitals reviewed.  HENT:     Head: Normocephalic and atraumatic.  Eyes:     Pupils: Pupils are equal, round, and reactive to light.  Cardiovascular:     Rate and Rhythm: Normal rate and regular rhythm.     Heart sounds: Normal heart sounds.     Comments: Cardiac exam shows an irregular rate and irregular rhythm consistent with atrial fibrillation.  The rate is well controlled.  He has 1/6 systolic ejection murmur. Pulmonary:     Effort: Pulmonary effort is normal.     Breath sounds: Normal breath sounds.  Abdominal:     General: Bowel sounds are normal.     Palpations: Abdomen is soft.     Comments: Abdominal exam shows multiple laparotomy scars.  He has no fluid wave.  He has no guarding or rebound tenderness.  There is no palpable abdominal mass.  There is no palpable liver or spleen tip.  Musculoskeletal:        General: No tenderness or deformity. Normal range of motion.     Cervical back: Normal range of motion.    Lymphadenopathy:     Cervical: No cervical adenopathy.  Skin:    General: Skin is warm and dry.     Findings: No erythema or rash.  Neurological:     Mental Status: He is alert and oriented to person, place, and time.  Psychiatric:        Behavior: Behavior normal.        Thought Content: Thought content normal.        Judgment: Judgment normal.        Lab Results  Component Value Date   WBC  6.3 08/13/2019   HGB 9.6 (L) 08/13/2019   HCT 31.6 (L) 08/13/2019   MCV 100.0 08/13/2019   PLT 70 (L) 08/13/2019   Lab Results  Component Value Date   FERRITIN 204 05/01/2019   IRON 55 05/01/2019   TIBC 225 05/01/2019   UIBC 170 05/01/2019   IRONPCTSAT 24 05/01/2019   Lab Results  Component Value Date   RBC 3.16 (L) 08/13/2019   No results found for: KPAFRELGTCHN, LAMBDASER, KAPLAMBRATIO No results found for: IGGSERUM, IGA, IGMSERUM No results found for: Ronnald Ramp, A1GS, A2GS, Tillman Sers, SPEI   Chemistry      Component Value Date/Time   NA 143 07/22/2019 0950   K 3.9 07/22/2019 0950   CL 107 07/22/2019 0950   CO2 30 07/22/2019 0950   BUN 16 07/22/2019 0950   CREATININE 1.10 07/22/2019 0950      Component Value Date/Time   CALCIUM 9.1 07/22/2019 0950   ALKPHOS 164 (H) 07/22/2019 0950   AST 32 07/22/2019 0950   ALT 26 07/22/2019 0950   BILITOT 1.0 07/22/2019 0950       Impression and Plan: Mr. Mcelrath is a very pleasant 74 yo caucasian gentleman with metastatic colon cancer.   I realize that his platelet count is down a little bit.  However, he is responding to treatment.  I would hate to have delays.  We will go ahead and treat today.  He will be due for another CT scan in about 2 weeks or so.  We will plan to have him come back in 3 weeks.  Hopefully, with our CT scan will show that he is responding to treatment.      [name], MD 8/10/20219:28 AM

## 2019-08-13 NOTE — Patient Instructions (Signed)
Implanted Port Insertion, Care After °This sheet gives you information about how to care for yourself after your procedure. Your health care provider may also give you more specific instructions. If you have problems or questions, contact your health care provider. °What can I expect after the procedure? °After the procedure, it is common to have: °· Discomfort at the port insertion site. °· Bruising on the skin over the port. This should improve over 3-4 days. °Follow these instructions at home: °Port care °· After your port is placed, you will get a manufacturer's information card. The card has information about your port. Keep this card with you at all times. °· Take care of the port as told by your health care provider. Ask your health care provider if you or a family member can get training for taking care of the port at home. A home health care nurse may also take care of the port. °· Make sure to remember what type of port you have. °Incision care ° °  ° °· Follow instructions from your health care provider about how to take care of your port insertion site. Make sure you: °? Wash your hands with soap and water before and after you change your bandage (dressing). If soap and water are not available, use hand sanitizer. °? Change your dressing as told by your health care provider. °? Leave stitches (sutures), skin glue, or adhesive strips in place. These skin closures may need to stay in place for 2 weeks or longer. If adhesive strip edges start to loosen and curl up, you may trim the loose edges. Do not remove adhesive strips completely unless your health care provider tells you to do that. °· Check your port insertion site every day for signs of infection. Check for: °? Redness, swelling, or pain. °? Fluid or blood. °? Warmth. °? Pus or a bad smell. °Activity °· Return to your normal activities as told by your health care provider. Ask your health care provider what activities are safe for you. °· Do not  lift anything that is heavier than 10 lb (4.5 kg), or the limit that you are told, until your health care provider says that it is safe. °General instructions °· Take over-the-counter and prescription medicines only as told by your health care provider. °· Do not take baths, swim, or use a hot tub until your health care provider approves. Ask your health care provider if you may take showers. You may only be allowed to take sponge baths. °· Do not drive for 24 hours if you were given a sedative during your procedure. °· Wear a medical alert bracelet in case of an emergency. This will tell any health care providers that you have a port. °· Keep all follow-up visits as told by your health care provider. This is important. °Contact a health care provider if: °· You cannot flush your port with saline as directed, or you cannot draw blood from the port. °· You have a fever or chills. °· You have redness, swelling, or pain around your port insertion site. °· You have fluid or blood coming from your port insertion site. °· Your port insertion site feels warm to the touch. °· You have pus or a bad smell coming from the port insertion site. °Get help right away if: °· You have chest pain or shortness of breath. °· You have bleeding from your port that you cannot control. °Summary °· Take care of the port as told by your health   care provider. Keep the manufacturer's information card with you at all times. °· Change your dressing as told by your health care provider. °· Contact a health care provider if you have a fever or chills or if you have redness, swelling, or pain around your port insertion site. °· Keep all follow-up visits as told by your health care provider. °This information is not intended to replace advice given to you by your health care provider. Make sure you discuss any questions you have with your health care provider. °Document Revised: 07/18/2017 Document Reviewed: 07/18/2017 °Elsevier Patient Education ©  2020 [employer] ° °

## 2019-08-15 ENCOUNTER — Other Ambulatory Visit: Payer: Self-pay

## 2019-08-15 ENCOUNTER — Inpatient Hospital Stay: Payer: Medicare HMO

## 2019-08-15 ENCOUNTER — Other Ambulatory Visit: Payer: Self-pay | Admitting: *Deleted

## 2019-08-15 VITALS — BP 149/74 | HR 66 | Temp 98.5°F | Resp 18

## 2019-08-15 DIAGNOSIS — C189 Malignant neoplasm of colon, unspecified: Secondary | ICD-10-CM

## 2019-08-15 DIAGNOSIS — C787 Secondary malignant neoplasm of liver and intrahepatic bile duct: Secondary | ICD-10-CM

## 2019-08-15 DIAGNOSIS — Z5111 Encounter for antineoplastic chemotherapy: Secondary | ICD-10-CM | POA: Diagnosis not present

## 2019-08-15 MED ORDER — SODIUM CHLORIDE 0.9% FLUSH
10.0000 mL | INTRAVENOUS | Status: DC | PRN
  Administered 2019-08-15: 10 mL
  Filled 2019-08-15: qty 10

## 2019-08-15 MED ORDER — HEPARIN SOD (PORK) LOCK FLUSH 100 UNIT/ML IV SOLN
500.0000 [IU] | Freq: Once | INTRAVENOUS | Status: AC | PRN
  Administered 2019-08-15: 500 [IU]
  Filled 2019-08-15: qty 5

## 2019-08-15 MED ORDER — PEGFILGRASTIM-JMDB 6 MG/0.6ML ~~LOC~~ SOSY
6.0000 mg | PREFILLED_SYRINGE | Freq: Once | SUBCUTANEOUS | Status: AC
  Administered 2019-08-15: 6 mg via SUBCUTANEOUS

## 2019-08-15 MED ORDER — DEXAMETHASONE 4 MG PO TABS: 8.0000 mg | ORAL_TABLET | Freq: Every day | ORAL | 1 refills | Status: DC

## 2019-08-15 NOTE — Progress Notes (Signed)
5-fu pump dc'd. Patient tolerated well, site unremarkable.

## 2019-08-15 NOTE — Patient Instructions (Signed)

## 2019-09-02 ENCOUNTER — Telehealth: Payer: Self-pay | Admitting: *Deleted

## 2019-09-02 ENCOUNTER — Ambulatory Visit (INDEPENDENT_AMBULATORY_CARE_PROVIDER_SITE_OTHER): Payer: Medicare HMO

## 2019-09-02 ENCOUNTER — Other Ambulatory Visit: Payer: Self-pay

## 2019-09-02 DIAGNOSIS — C189 Malignant neoplasm of colon, unspecified: Secondary | ICD-10-CM

## 2019-09-02 DIAGNOSIS — C787 Secondary malignant neoplasm of liver and intrahepatic bile duct: Secondary | ICD-10-CM | POA: Diagnosis not present

## 2019-09-02 MED ORDER — IOHEXOL 300 MG/ML  SOLN
100.0000 mL | Freq: Once | INTRAMUSCULAR | Status: AC | PRN
  Administered 2019-09-02: 100 mL via INTRAVENOUS

## 2019-09-02 NOTE — Telephone Encounter (Signed)
Patient notified per order of Dr. Marin Olp that "overall, the CT scan is stable!!!  Zachary Willis"  Pt appreciative of call and has no questions or concerns at this time.

## 2019-09-02 NOTE — Telephone Encounter (Signed)
-----   Message from [name], MD sent at 09/02/2019  3:23 PM EDT ----- Call - Overall, the CT scan is stable!!!  [name]

## 2019-09-03 ENCOUNTER — Telehealth: Payer: Self-pay | Admitting: *Deleted

## 2019-09-03 ENCOUNTER — Inpatient Hospital Stay: Payer: Medicare HMO

## 2019-09-03 ENCOUNTER — Encounter: Payer: Self-pay | Admitting: Hematology & Oncology

## 2019-09-03 ENCOUNTER — Inpatient Hospital Stay (HOSPITAL_BASED_OUTPATIENT_CLINIC_OR_DEPARTMENT_OTHER): Payer: Medicare HMO | Admitting: Hematology & Oncology

## 2019-09-03 VITALS — BP 135/61 | HR 62 | Temp 98.6°F | Resp 19 | Ht 70.0 in | Wt 255.1 lb

## 2019-09-03 DIAGNOSIS — C787 Secondary malignant neoplasm of liver and intrahepatic bile duct: Secondary | ICD-10-CM

## 2019-09-03 DIAGNOSIS — E876 Hypokalemia: Secondary | ICD-10-CM

## 2019-09-03 DIAGNOSIS — C189 Malignant neoplasm of colon, unspecified: Secondary | ICD-10-CM

## 2019-09-03 DIAGNOSIS — Z5111 Encounter for antineoplastic chemotherapy: Secondary | ICD-10-CM | POA: Diagnosis not present

## 2019-09-03 LAB — FERRITIN: Ferritin: 114 ng/mL (ref 24–336)

## 2019-09-03 LAB — CMP (CANCER CENTER ONLY)
ALT: 32 U/L (ref 0–44)
AST: 34 U/L (ref 15–41)
Albumin: 3.4 g/dL — ABNORMAL LOW (ref 3.5–5.0)
Alkaline Phosphatase: 209 U/L — ABNORMAL HIGH (ref 38–126)
Anion gap: 9 (ref 5–15)
BUN: 11 mg/dL (ref 8–23)
CO2: 30 mmol/L (ref 22–32)
Calcium: 8.8 mg/dL — ABNORMAL LOW (ref 8.9–10.3)
Chloride: 106 mmol/L (ref 98–111)
Creatinine: 1.1 mg/dL (ref 0.61–1.24)
GFR, Est AFR Am: >60 mL/min (ref >60)
GFR, Estimated: >60 mL/min (ref >60)
Glucose, Bld: 150 mg/dL — ABNORMAL HIGH (ref 70–99)
Potassium: 3 mmol/L — CL (ref 3.5–5.1)
Sodium: 145 mmol/L (ref 135–145)
Total Bilirubin: 1.2 mg/dL (ref 0.3–1.2)
Total Protein: 5.4 g/dL — ABNORMAL LOW (ref 6.5–8.1)

## 2019-09-03 LAB — CBC WITH DIFFERENTIAL (CANCER CENTER ONLY)
Abs Immature Granulocytes: 0.3 10*3/uL — ABNORMAL HIGH (ref 0.00–0.07)
Basophils Absolute: 0.1 10*3/uL (ref 0.0–0.1)
Basophils Relative: 1 %
Eosinophils Absolute: 0.1 10*3/uL (ref 0.0–0.5)
Eosinophils Relative: 2 %
HCT: 30.4 % — ABNORMAL LOW (ref 39.0–52.0)
Hemoglobin: 9.3 g/dL — ABNORMAL LOW (ref 13.0–17.0)
Immature Granulocytes: 5 %
Lymphocytes Relative: 19 %
Lymphs Abs: 1.2 10*3/uL (ref 0.7–4.0)
MCH: 30.3 pg (ref 26.0–34.0)
MCHC: 30.6 g/dL (ref 30.0–36.0)
MCV: 99 fL (ref 80.0–100.0)
Monocytes Absolute: 0.5 10*3/uL (ref 0.1–1.0)
Monocytes Relative: 9 %
Neutro Abs: 4.1 10*3/uL (ref 1.7–7.7)
Neutrophils Relative %: 64 %
Platelet Count: 85 10*3/uL — ABNORMAL LOW (ref 150–400)
RBC: 3.07 MIL/uL — ABNORMAL LOW (ref 4.22–5.81)
RDW: 17.4 % — ABNORMAL HIGH (ref 11.5–15.5)
WBC Count: 6.3 10*3/uL (ref 4.0–10.5)
nRBC: 1.1 % — ABNORMAL HIGH (ref 0.0–0.2)

## 2019-09-03 LAB — IRON AND TIBC
Iron: 52 ug/dL (ref 42–163)
Saturation Ratios: 21 % (ref 20–55)
TIBC: 241 ug/dL (ref 202–409)
UIBC: 189 ug/dL (ref 117–376)

## 2019-09-03 LAB — LACTATE DEHYDROGENASE: LDH: 425 U/L — ABNORMAL HIGH (ref 98–192)

## 2019-09-03 LAB — CEA (IN HOUSE-CHCC): CEA (CHCC-In House): 79.92 ng/mL — ABNORMAL HIGH (ref 0.00–5.00)

## 2019-09-03 MED ORDER — SODIUM CHLORIDE 0.9 % IV SOLN
Freq: Once | INTRAVENOUS | Status: AC
  Filled 2019-09-03: qty 250

## 2019-09-03 MED ORDER — SODIUM CHLORIDE 0.9 % IV SOLN
2140.0000 mg/m2 | INTRAVENOUS | Status: DC
  Administered 2019-09-03: 5000 mg via INTRAVENOUS
  Filled 2019-09-03: qty 100

## 2019-09-03 MED ORDER — SODIUM CHLORIDE 0.9 % IV SOLN
400.0000 mg/m2 | Freq: Once | INTRAVENOUS | Status: AC
  Administered 2019-09-03: 936 mg via INTRAVENOUS
  Filled 2019-09-03: qty 46.8

## 2019-09-03 MED ORDER — SODIUM CHLORIDE 0.9 % IV SOLN
10.0000 mg | Freq: Once | INTRAVENOUS | Status: AC
  Administered 2019-09-03: 10 mg via INTRAVENOUS
  Filled 2019-09-03: qty 10

## 2019-09-03 MED ORDER — POTASSIUM CHLORIDE CRYS ER 20 MEQ PO TBCR
40.0000 meq | EXTENDED_RELEASE_TABLET | Freq: Once | ORAL | Status: AC
  Administered 2019-09-03: 40 meq via ORAL
  Filled 2019-09-03: qty 2

## 2019-09-03 MED ORDER — ATROPINE SULFATE 1 MG/ML IJ SOLN
0.5000 mg | Freq: Once | INTRAMUSCULAR | Status: AC | PRN
  Administered 2019-09-03: 0.5 mg via INTRAVENOUS

## 2019-09-03 MED ORDER — SODIUM CHLORIDE 0.9 % IV SOLN
129.6000 mg/m2 | Freq: Once | INTRAVENOUS | Status: AC
  Administered 2019-09-03: 300 mg via INTRAVENOUS
  Filled 2019-09-03: qty 15

## 2019-09-03 MED ORDER — PALONOSETRON HCL INJECTION 0.25 MG/5ML
0.2500 mg | Freq: Once | INTRAVENOUS | Status: AC
  Administered 2019-09-03: 0.25 mg via INTRAVENOUS

## 2019-09-03 MED ORDER — ATROPINE SULFATE 1 MG/ML IJ SOLN
INTRAMUSCULAR | Status: AC
  Filled 2019-09-03: qty 1

## 2019-09-03 MED ORDER — PALONOSETRON HCL INJECTION 0.25 MG/5ML
INTRAVENOUS | Status: AC
  Filled 2019-09-03: qty 5

## 2019-09-03 NOTE — Patient Instructions (Signed)
[facility] Discharge Instructions for Patients Receiving Chemotherapy  Today you received the following chemotherapy agents 5FU, Camptosar  To help prevent nausea and vomiting after your treatment, we encourage you to take your nausea medication    If you develop nausea and vomiting that is not controlled by your nausea medication, call the clinic.   BELOW ARE SYMPTOMS THAT SHOULD BE REPORTED IMMEDIATELY:  *FEVER GREATER THAN 100.5 F  *CHILLS WITH OR WITHOUT FEVER  NAUSEA AND VOMITING THAT IS NOT CONTROLLED WITH YOUR NAUSEA MEDICATION  *UNUSUAL SHORTNESS OF BREATH  *UNUSUAL BRUISING OR BLEEDING  TENDERNESS IN MOUTH AND THROAT WITH OR WITHOUT PRESENCE OF ULCERS  *URINARY PROBLEMS  *BOWEL PROBLEMS  UNUSUAL RASH Items with * indicate a potential emergency and should be followed up as soon as possible.  Feel free to call the clinic should you have any questions or concerns. The clinic phone number is [phone].  Please show the [facility] at check-in to the Emergency Department and triage nurse.

## 2019-09-03 NOTE — Patient Instructions (Signed)
Implanted Port Insertion, Care After °This sheet gives you information about how to care for yourself after your procedure. Your health care provider may also give you more specific instructions. If you have problems or questions, contact your health care provider. °What can I expect after the procedure? °After the procedure, it is common to have: °· Discomfort at the port insertion site. °· Bruising on the skin over the port. This should improve over 3-4 days. °Follow these instructions at home: °Port care °· After your port is placed, you will get a manufacturer's information card. The card has information about your port. Keep this card with you at all times. °· Take care of the port as told by your health care provider. Ask your health care provider if you or a family member can get training for taking care of the port at home. A home health care nurse may also take care of the port. °· Make sure to remember what type of port you have. °Incision care ° °  ° °· Follow instructions from your health care provider about how to take care of your port insertion site. Make sure you: °? Wash your hands with soap and water before and after you change your bandage (dressing). If soap and water are not available, use hand sanitizer. °? Change your dressing as told by your health care provider. °? Leave stitches (sutures), skin glue, or adhesive strips in place. These skin closures may need to stay in place for 2 weeks or longer. If adhesive strip edges start to loosen and curl up, you may trim the loose edges. Do not remove adhesive strips completely unless your health care provider tells you to do that. °· Check your port insertion site every day for signs of infection. Check for: °? Redness, swelling, or pain. °? Fluid or blood. °? Warmth. °? Pus or a bad smell. °Activity °· Return to your normal activities as told by your health care provider. Ask your health care provider what activities are safe for you. °· Do not  lift anything that is heavier than 10 lb (4.5 kg), or the limit that you are told, until your health care provider says that it is safe. °General instructions °· Take over-the-counter and prescription medicines only as told by your health care provider. °· Do not take baths, swim, or use a hot tub until your health care provider approves. Ask your health care provider if you may take showers. You may only be allowed to take sponge baths. °· Do not drive for 24 hours if you were given a sedative during your procedure. °· Wear a medical alert bracelet in case of an emergency. This will tell any health care providers that you have a port. °· Keep all follow-up visits as told by your health care provider. This is important. °Contact a health care provider if: °· You cannot flush your port with saline as directed, or you cannot draw blood from the port. °· You have a fever or chills. °· You have redness, swelling, or pain around your port insertion site. °· You have fluid or blood coming from your port insertion site. °· Your port insertion site feels warm to the touch. °· You have pus or a bad smell coming from the port insertion site. °Get help right away if: °· You have chest pain or shortness of breath. °· You have bleeding from your port that you cannot control. °Summary °· Take care of the port as told by your health   care provider. Keep the manufacturer's information card with you at all times. °· Change your dressing as told by your health care provider. °· Contact a health care provider if you have a fever or chills or if you have redness, swelling, or pain around your port insertion site. °· Keep all follow-up visits as told by your health care provider. °This information is not intended to replace advice given to you by your health care provider. Make sure you discuss any questions you have with your health care provider. °Document Revised: 07/18/2017 Document Reviewed: 07/18/2017 °Elsevier Patient Education ©  2020 [employer] ° °

## 2019-09-03 NOTE — Progress Notes (Signed)
Hematology and Oncology Follow Up Visit  KAELIN BONELLI [id number] [birth year] 74 y.o. 09/03/2019   Principle Diagnosis:  Metastatic colon cancer - progressive  Past Therapy: Status post RFA to the liver-11/21/2017  S/P Y-90 x 2 -- last therapy on 08/27/2018  Current Therapy: FOLFIRI -- started on 01/09/2019, s/p cycle #11   Interim History:  Mr. Serano is here today for follow-up.  Overall, he is doing okay.  We did do a CT scan on him.  This was done on August 30.  Overall, the CT scan looked relatively stable.  There is no new areas of disease.  He has stable pulmonary nodules.  He has stable hepatic metastasis.  He has had no cardiac issues.  He has had no chest wall pain.  His last CEA was up a little bit at 88 but his tends to go up and down.  He has had no change in bowel or bladder habits.  There has been no bleeding.  He has had no increased leg swelling.  He has had no headache.  There has been no mouth sores.  Overall, I would say his performance status is ECOG 1.    Medications:  Allergies as of 09/03/2019   No Known Allergies     Medication List       Accurate as of September 03, 2019  9:13 AM. If you have any questions, ask your nurse or doctor.        acyclovir 400 MG tablet Commonly known as: ZOVIRAX TAKE 1 TABLET BY MOUTH TWICE A DAY   allopurinol 300 MG tablet Commonly known as: ZYLOPRIM Take 300 mg by mouth every morning.   atorvastatin 40 MG tablet Commonly known as: LIPITOR Take 40 mg by mouth daily.   carvedilol 6.25 MG tablet Commonly known as: COREG TAKE 1 TABLET (6.25 MG TOTAL) BY MOUTH 2 TIMES DAILY WITH MEALS.   [employer]S Aspirin Adult Low Dose 81 MG chewable tablet Generic drug: aspirin Chew 81 mg by mouth daily.   [facility] Stool Softener/Laxative 8.6-50 MG tablet Generic drug: senna-docusate TAKE 2 TABLETS EVERY EVENING   dexamethasone 4 MG tablet Commonly known as: DECADRON Take 2 tablets (8 mg total) by mouth daily. Start the day  after chemo for 2 days.   ferrous sulfate 325 (65 FE) MG tablet Take 325 mg by mouth daily.   furosemide 40 MG tablet Commonly known as: LASIX Take 40 mg by mouth daily.   hydrocortisone 10 MG tablet Commonly known as: CORTEF Take 5-15 mg by mouth See admin instructions. Takes 15 mg in am and 5mg  in afternoon.   Klor-Con 10 10 MEQ tablet Generic drug: potassium chloride Take 20 mEq by mouth daily.   levothyroxine 88 MCG tablet Commonly known as: SYNTHROID Take 88 mcg by mouth daily before breakfast.   lidocaine-prilocaine cream Commonly known as: EMLA Apply to affected area once   loperamide 2 MG tablet Commonly known as: Imodium A-D Take 1 tablet (2 mg total) by mouth 4 (four) times daily as needed. Take 2 at diarrhea onset , then 1 every 2hr until 12hrs with no BM. May take 2 every 4hrs at night. If diarrhea recurs repeat.   LORazepam 1 MG tablet Commonly known as: Ativan Take 1 tablet (1 mg total) by mouth every 6 (six) hours as needed (NAUSEA).   NASAL [place] NA Place 1 spray into the nose daily.   ondansetron 8 MG tablet Commonly known as: ZOFRAN Take 1 tablet (8 mg total) by mouth  2 (two) times daily as needed for refractory nausea / vomiting. Start on day 3 after chemotherapy.   pantoprazole 40 MG tablet Commonly known as: PROTONIX Take 40 mg by mouth every morning.   polyvinyl alcohol 1.4 % ophthalmic solution Commonly known as: LIQUIFILM TEARS Place 1 drop into both eyes daily as needed for dry eyes.   prochlorperazine 10 MG tablet Commonly known as: COMPAZINE Take 1 tablet (10 mg total) by mouth every 6 (six) hours as needed (NAUSEA).   testosterone 4 MG/24HR Pt24 patch Commonly known as: ANDRODERM Place 1 patch onto the skin daily.   warfarin 5 MG tablet Commonly known as: COUMADIN Take 5 mg by mouth daily.       Allergies: No Known Allergies  Past Medical History, Surgical history, Social history, and Family History were reviewed and  updated.  Review of Systems: Review of Systems  Constitutional: Negative.   HENT: Negative.   Eyes: Negative.   Respiratory: Negative.   Cardiovascular: Positive for palpitations.  Gastrointestinal: Negative.   Genitourinary: Negative.   Musculoskeletal: Negative.   Skin: Negative.   Neurological: Negative.   Endo/Heme/Allergies: Negative.   Psychiatric/Behavioral: Negative.       Physical Exam:  height is 5\' 10"  (1.778 m) and weight is 255 lb 1.6 oz (115.7 kg). His oral temperature is 98.6 F (37 C). His blood pressure is 135/61 and his pulse is 62. His respiration is 19 and oxygen saturation is 100%.   Wt Readings from Last 3 Encounters:  09/03/19 255 lb 1.6 oz (115.7 kg)  08/13/19 257 lb 0.6 oz (116.6 kg)  07/22/19 258 lb (117 kg)    Physical Exam Vitals reviewed.  HENT:     Head: Normocephalic and atraumatic.  Eyes:     Pupils: Pupils are equal, round, and reactive to light.  Cardiovascular:     Rate and Rhythm: Normal rate and regular rhythm.     Heart sounds: Normal heart sounds.     Comments: Cardiac exam shows an irregular rate and irregular rhythm consistent with atrial fibrillation.  The rate is well controlled.  He has 1/6 systolic ejection murmur. Pulmonary:     Effort: Pulmonary effort is normal.     Breath sounds: Normal breath sounds.  Abdominal:     General: Bowel sounds are normal.     Palpations: Abdomen is soft.     Comments: Abdominal exam shows multiple laparotomy scars.  He has no fluid wave.  He has no guarding or rebound tenderness.  There is no palpable abdominal mass.  There is no palpable liver or spleen tip.  Musculoskeletal:        General: No tenderness or deformity. Normal range of motion.     Cervical back: Normal range of motion.  Lymphadenopathy:     Cervical: No cervical adenopathy.  Skin:    General: Skin is warm and dry.     Findings: No erythema or rash.  Neurological:     Mental Status: He is alert and oriented to person,  place, and time.  Psychiatric:        Behavior: Behavior normal.        Thought Content: Thought content normal.        Judgment: Judgment normal.        Lab Results  Component Value Date   WBC 6.3 09/03/2019   HGB 9.3 (L) 09/03/2019   HCT 30.4 (L) 09/03/2019   MCV 99.0 09/03/2019   PLT 85 (L) 09/03/2019   Lab  Results  Component Value Date   FERRITIN 204 05/01/2019   IRON 55 05/01/2019   TIBC 225 05/01/2019   UIBC 170 05/01/2019   IRONPCTSAT 24 05/01/2019   Lab Results  Component Value Date   RBC 3.07 (L) 09/03/2019   No results found for: KPAFRELGTCHN, LAMBDASER, KAPLAMBRATIO No results found for: IGGSERUM, IGA, IGMSERUM No results found for: Odetta Pink, SPEI   Chemistry      Component Value Date/Time   NA 144 08/13/2019 0859   K 3.7 08/13/2019 0859   CL 106 08/13/2019 0859   CO2 31 08/13/2019 0859   BUN 16 08/13/2019 0859   CREATININE 1.22 08/13/2019 0859      Component Value Date/Time   CALCIUM 9.4 08/13/2019 0859   ALKPHOS 187 (H) 08/13/2019 0859   AST 35 08/13/2019 0859   ALT 32 08/13/2019 0859   BILITOT 1.1 08/13/2019 0859       Impression and Plan: Mr. Goncalves is a very pleasant 74 yo caucasian gentleman with metastatic colon cancer.   I realize that his platelet count is down a little bit.  However, he is responding to treatment.  I would hate to have delays.  Unfortunately, we have try to get molecular markers on him.  There has not been enough tissue in the past.  I think we do have to make a change in his treatment or if we need to add targeted therapy, we will have to get another biopsy.  He clearly has disease that can be biopsied.  I just do not have a lot of faith in getting liquid biopsies.  I have just not had much success in the results.  We will go ahead with his 12th cycle of treatment.  We will plan to get him back in about 3 weeks now.  I think 3-week follow-up would be a good  interval.    [name], MD 8/31/20219:13 AM

## 2019-09-03 NOTE — Progress Notes (Signed)
Ok to treat with platelets of 85,000 per Dr Marin Olp.  Potassium 3..  Orders received for oral potassium

## 2019-09-03 NOTE — Telephone Encounter (Signed)
Dr. Marin Olp notified of potassium-3.0.  Order received for pt to get Potassium 40 meq PO now and Potassium 40 meq PO prior to discharge today per Dr. Marin Olp.

## 2019-09-05 ENCOUNTER — Other Ambulatory Visit: Payer: Self-pay

## 2019-09-05 ENCOUNTER — Inpatient Hospital Stay: Payer: Medicare HMO | Attending: Hematology & Oncology

## 2019-09-05 VITALS — BP 134/84 | HR 66 | Temp 98.6°F | Resp 17

## 2019-09-05 DIAGNOSIS — C787 Secondary malignant neoplasm of liver and intrahepatic bile duct: Secondary | ICD-10-CM | POA: Diagnosis not present

## 2019-09-05 DIAGNOSIS — C189 Malignant neoplasm of colon, unspecified: Secondary | ICD-10-CM | POA: Diagnosis present

## 2019-09-05 DIAGNOSIS — Z5189 Encounter for other specified aftercare: Secondary | ICD-10-CM | POA: Insufficient documentation

## 2019-09-05 MED ORDER — PEGFILGRASTIM-JMDB 6 MG/0.6ML ~~LOC~~ SOSY
6.0000 mg | PREFILLED_SYRINGE | Freq: Once | SUBCUTANEOUS | Status: AC
  Administered 2019-09-05: 6 mg via SUBCUTANEOUS

## 2019-09-05 MED ORDER — HEPARIN SOD (PORK) LOCK FLUSH 100 UNIT/ML IV SOLN
500.0000 [IU] | Freq: Once | INTRAVENOUS | Status: AC | PRN
  Administered 2019-09-05: 500 [IU]
  Filled 2019-09-05: qty 5

## 2019-09-05 MED ORDER — SODIUM CHLORIDE 0.9% FLUSH
10.0000 mL | INTRAVENOUS | Status: DC | PRN
  Administered 2019-09-05: 10 mL
  Filled 2019-09-05: qty 10

## 2019-09-05 MED ORDER — PEGFILGRASTIM-JMDB 6 MG/0.6ML ~~LOC~~ SOSY
PREFILLED_SYRINGE | SUBCUTANEOUS | Status: AC
  Filled 2019-09-05: qty 0.6

## 2019-09-05 NOTE — Patient Instructions (Signed)

## 2019-09-11 ENCOUNTER — Other Ambulatory Visit: Payer: Medicare HMO

## 2019-09-11 ENCOUNTER — Inpatient Hospital Stay (HOSPITAL_COMMUNITY)
Admission: EM | Admit: 2019-09-11 | Discharge: 2019-09-20 | DRG: 871 | Disposition: A | Discharge status: Home Health | Payer: Medicare HMO | Attending: Family Medicine | Admitting: Family Medicine

## 2019-09-11 ENCOUNTER — Telehealth: Payer: Self-pay | Admitting: *Deleted

## 2019-09-11 ENCOUNTER — Inpatient Hospital Stay (HOSPITAL_COMMUNITY): Payer: Medicare HMO

## 2019-09-11 ENCOUNTER — Emergency Department (HOSPITAL_COMMUNITY): Payer: Medicare HMO

## 2019-09-11 ENCOUNTER — Ambulatory Visit: Payer: Medicare HMO

## 2019-09-11 ENCOUNTER — Encounter (HOSPITAL_COMMUNITY): Payer: Self-pay | Admitting: Emergency Medicine

## 2019-09-11 ENCOUNTER — Other Ambulatory Visit: Payer: Self-pay

## 2019-09-11 DIAGNOSIS — I482 Chronic atrial fibrillation, unspecified: Secondary | ICD-10-CM | POA: Diagnosis present

## 2019-09-11 DIAGNOSIS — I313 Pericardial effusion (noninflammatory): Secondary | ICD-10-CM | POA: Diagnosis present

## 2019-09-11 DIAGNOSIS — Z20822 Contact with and (suspected) exposure to covid-19: Secondary | ICD-10-CM | POA: Diagnosis present

## 2019-09-11 DIAGNOSIS — R791 Abnormal coagulation profile: Secondary | ICD-10-CM | POA: Diagnosis not present

## 2019-09-11 DIAGNOSIS — E872 Acidosis: Secondary | ICD-10-CM | POA: Diagnosis present

## 2019-09-11 DIAGNOSIS — I5043 Acute on chronic combined systolic (congestive) and diastolic (congestive) heart failure: Secondary | ICD-10-CM | POA: Diagnosis present

## 2019-09-11 DIAGNOSIS — E039 Hypothyroidism, unspecified: Secondary | ICD-10-CM | POA: Diagnosis present

## 2019-09-11 DIAGNOSIS — A0472 Enterocolitis due to Clostridium difficile, not specified as recurrent: Secondary | ICD-10-CM | POA: Diagnosis present

## 2019-09-11 DIAGNOSIS — E230 Hypopituitarism: Secondary | ICD-10-CM | POA: Diagnosis present

## 2019-09-11 DIAGNOSIS — N179 Acute kidney failure, unspecified: Secondary | ICD-10-CM | POA: Diagnosis not present

## 2019-09-11 DIAGNOSIS — I130 Hypertensive heart and chronic kidney disease with heart failure and stage 1 through stage 4 chronic kidney disease, or unspecified chronic kidney disease: Secondary | ICD-10-CM | POA: Diagnosis present

## 2019-09-11 DIAGNOSIS — I959 Hypotension, unspecified: Secondary | ICD-10-CM | POA: Diagnosis not present

## 2019-09-11 DIAGNOSIS — C7800 Secondary malignant neoplasm of unspecified lung: Secondary | ICD-10-CM | POA: Diagnosis present

## 2019-09-11 DIAGNOSIS — Z923 Personal history of irradiation: Secondary | ICD-10-CM

## 2019-09-11 DIAGNOSIS — Z79899 Other long term (current) drug therapy: Secondary | ICD-10-CM

## 2019-09-11 DIAGNOSIS — E1165 Type 2 diabetes mellitus with hyperglycemia: Secondary | ICD-10-CM | POA: Diagnosis present

## 2019-09-11 DIAGNOSIS — R6521 Severe sepsis with septic shock: Secondary | ICD-10-CM | POA: Diagnosis present

## 2019-09-11 DIAGNOSIS — C7951 Secondary malignant neoplasm of bone: Secondary | ICD-10-CM | POA: Diagnosis present

## 2019-09-11 DIAGNOSIS — M109 Gout, unspecified: Secondary | ICD-10-CM | POA: Diagnosis present

## 2019-09-11 DIAGNOSIS — N182 Chronic kidney disease, stage 2 (mild): Secondary | ICD-10-CM | POA: Diagnosis present

## 2019-09-11 DIAGNOSIS — I4811 Longstanding persistent atrial fibrillation: Secondary | ICD-10-CM

## 2019-09-11 DIAGNOSIS — R571 Hypovolemic shock: Secondary | ICD-10-CM | POA: Diagnosis present

## 2019-09-11 DIAGNOSIS — E2740 Unspecified adrenocortical insufficiency: Secondary | ICD-10-CM | POA: Diagnosis not present

## 2019-09-11 DIAGNOSIS — D689 Coagulation defect, unspecified: Secondary | ICD-10-CM | POA: Diagnosis present

## 2019-09-11 DIAGNOSIS — R197 Diarrhea, unspecified: Secondary | ICD-10-CM | POA: Diagnosis not present

## 2019-09-11 DIAGNOSIS — D709 Neutropenia, unspecified: Secondary | ICD-10-CM | POA: Diagnosis present

## 2019-09-11 DIAGNOSIS — D630 Anemia in neoplastic disease: Secondary | ICD-10-CM | POA: Diagnosis present

## 2019-09-11 DIAGNOSIS — K219 Gastro-esophageal reflux disease without esophagitis: Secondary | ICD-10-CM | POA: Diagnosis present

## 2019-09-11 DIAGNOSIS — Z79890 Hormone replacement therapy (postmenopausal): Secondary | ICD-10-CM

## 2019-09-11 DIAGNOSIS — R579 Shock, unspecified: Secondary | ICD-10-CM | POA: Diagnosis present

## 2019-09-11 DIAGNOSIS — Z6838 Body mass index (BMI) 38.0-38.9, adult: Secondary | ICD-10-CM

## 2019-09-11 DIAGNOSIS — N170 Acute kidney failure with tubular necrosis: Secondary | ICD-10-CM | POA: Diagnosis present

## 2019-09-11 DIAGNOSIS — E669 Obesity, unspecified: Secondary | ICD-10-CM | POA: Diagnosis present

## 2019-09-11 DIAGNOSIS — Z9049 Acquired absence of other specified parts of digestive tract: Secondary | ICD-10-CM

## 2019-09-11 DIAGNOSIS — I2510 Atherosclerotic heart disease of native coronary artery without angina pectoris: Secondary | ICD-10-CM | POA: Diagnosis not present

## 2019-09-11 DIAGNOSIS — E1122 Type 2 diabetes mellitus with diabetic chronic kidney disease: Secondary | ICD-10-CM | POA: Diagnosis present

## 2019-09-11 DIAGNOSIS — T451X5A Adverse effect of antineoplastic and immunosuppressive drugs, initial encounter: Secondary | ICD-10-CM | POA: Diagnosis present

## 2019-09-11 DIAGNOSIS — E861 Hypovolemia: Secondary | ICD-10-CM | POA: Diagnosis present

## 2019-09-11 DIAGNOSIS — H02401 Unspecified ptosis of right eyelid: Secondary | ICD-10-CM | POA: Diagnosis present

## 2019-09-11 DIAGNOSIS — I480 Paroxysmal atrial fibrillation: Secondary | ICD-10-CM

## 2019-09-11 DIAGNOSIS — I4891 Unspecified atrial fibrillation: Secondary | ICD-10-CM | POA: Diagnosis not present

## 2019-09-11 DIAGNOSIS — E2749 Other adrenocortical insufficiency: Secondary | ICD-10-CM | POA: Diagnosis present

## 2019-09-11 DIAGNOSIS — R6520 Severe sepsis without septic shock: Secondary | ICD-10-CM

## 2019-09-11 DIAGNOSIS — Z7901 Long term (current) use of anticoagulants: Secondary | ICD-10-CM

## 2019-09-11 DIAGNOSIS — I255 Ischemic cardiomyopathy: Secondary | ICD-10-CM | POA: Diagnosis not present

## 2019-09-11 DIAGNOSIS — C787 Secondary malignant neoplasm of liver and intrahepatic bile duct: Secondary | ICD-10-CM | POA: Diagnosis present

## 2019-09-11 DIAGNOSIS — R5081 Fever presenting with conditions classified elsewhere: Secondary | ICD-10-CM | POA: Diagnosis present

## 2019-09-11 DIAGNOSIS — E876 Hypokalemia: Secondary | ICD-10-CM | POA: Diagnosis not present

## 2019-09-11 DIAGNOSIS — Z96653 Presence of artificial knee joint, bilateral: Secondary | ICD-10-CM | POA: Diagnosis present

## 2019-09-11 DIAGNOSIS — A419 Sepsis, unspecified organism: Principal | ICD-10-CM | POA: Diagnosis present

## 2019-09-11 DIAGNOSIS — D696 Thrombocytopenia, unspecified: Secondary | ICD-10-CM | POA: Diagnosis not present

## 2019-09-11 DIAGNOSIS — C189 Malignant neoplasm of colon, unspecified: Secondary | ICD-10-CM | POA: Diagnosis present

## 2019-09-11 DIAGNOSIS — Z951 Presence of aortocoronary bypass graft: Secondary | ICD-10-CM

## 2019-09-11 DIAGNOSIS — Z7952 Long term (current) use of systemic steroids: Secondary | ICD-10-CM

## 2019-09-11 DIAGNOSIS — I4819 Other persistent atrial fibrillation: Secondary | ICD-10-CM | POA: Diagnosis not present

## 2019-09-11 DIAGNOSIS — D649 Anemia, unspecified: Secondary | ICD-10-CM | POA: Diagnosis not present

## 2019-09-11 DIAGNOSIS — Z7982 Long term (current) use of aspirin: Secondary | ICD-10-CM

## 2019-09-11 LAB — SARS CORONAVIRUS 2 BY RT PCR (HOSPITAL ORDER, PERFORMED IN ~~LOC~~ HOSPITAL LAB): SARS Coronavirus 2: NEGATIVE

## 2019-09-11 LAB — CBC WITH DIFFERENTIAL/PLATELET
Abs Immature Granulocytes: 0.1 10*3/uL — ABNORMAL HIGH (ref 0.00–0.07)
Basophils Absolute: 0 10*3/uL (ref 0.0–0.1)
Basophils Relative: 0 %
Eosinophils Absolute: 0 10*3/uL (ref 0.0–0.5)
Eosinophils Relative: 2 %
HCT: 32.6 % — ABNORMAL LOW (ref 39.0–52.0)
Hemoglobin: 10.4 g/dL — ABNORMAL LOW (ref 13.0–17.0)
Lymphocytes Relative: 47 %
Lymphs Abs: 0.7 10*3/uL (ref 0.7–4.0)
MCH: 30.5 pg (ref 26.0–34.0)
MCHC: 31.9 g/dL (ref 30.0–36.0)
MCV: 95.6 fL (ref 80.0–100.0)
Monocytes Absolute: 0.1 10*3/uL (ref 0.1–1.0)
Monocytes Relative: 4 %
Myelocytes: 7 %
Neutro Abs: 0.5 10*3/uL — ABNORMAL LOW (ref 1.7–7.7)
Neutrophils Relative %: 39 %
Platelets: 74 10*3/uL — ABNORMAL LOW (ref 150–400)
Promyelocytes Relative: 1 %
RBC: 3.41 MIL/uL — ABNORMAL LOW (ref 4.22–5.81)
RDW: 17 % — ABNORMAL HIGH (ref 11.5–15.5)
WBC: 1.4 10*3/uL — CL (ref 4.0–10.5)
nRBC: 21.3 % — ABNORMAL HIGH (ref 0.0–0.2)

## 2019-09-11 LAB — PROTIME-INR
INR: 8.7 (ref 0.8–1.2)
Prothrombin Time: 69.2 seconds — ABNORMAL HIGH (ref 11.4–15.2)

## 2019-09-11 LAB — BRAIN NATRIURETIC PEPTIDE
B Natriuretic Peptide: 113.9 pg/mL — ABNORMAL HIGH (ref 0.0–100.0)
B Natriuretic Peptide: 157.7 pg/mL — ABNORMAL HIGH (ref 0.0–100.0)

## 2019-09-11 LAB — COMPREHENSIVE METABOLIC PANEL
ALT: 36 U/L (ref 0–44)
AST: 25 U/L (ref 15–41)
Albumin: 2.7 g/dL — ABNORMAL LOW (ref 3.5–5.0)
Alkaline Phosphatase: 184 U/L — ABNORMAL HIGH (ref 38–126)
Anion gap: 8 (ref 5–15)
BUN: 25 mg/dL — ABNORMAL HIGH (ref 8–23)
CO2: 25 mmol/L (ref 22–32)
Calcium: 8 mg/dL — ABNORMAL LOW (ref 8.9–10.3)
Chloride: 103 mmol/L (ref 98–111)
Creatinine, Ser: 1.23 mg/dL (ref 0.61–1.24)
GFR calc Af Amer: >60 mL/min (ref >60)
GFR calc non Af Amer: 59 mL/min — ABNORMAL LOW (ref >60)
Glucose, Bld: 125 mg/dL — ABNORMAL HIGH (ref 70–99)
Potassium: 3 mmol/L — ABNORMAL LOW (ref 3.5–5.1)
Sodium: 136 mmol/L (ref 135–145)
Total Bilirubin: 3.1 mg/dL — ABNORMAL HIGH (ref 0.3–1.2)
Total Protein: 4.7 g/dL — ABNORMAL LOW (ref 6.5–8.1)

## 2019-09-11 LAB — BASIC METABOLIC PANEL
Anion gap: 14 (ref 5–15)
BUN: 31 mg/dL — ABNORMAL HIGH (ref 8–23)
CO2: 19 mmol/L — ABNORMAL LOW (ref 22–32)
Calcium: 7.7 mg/dL — ABNORMAL LOW (ref 8.9–10.3)
Chloride: 104 mmol/L (ref 98–111)
Creatinine, Ser: 1.57 mg/dL — ABNORMAL HIGH (ref 0.61–1.24)
GFR calc Af Amer: 51 mL/min — ABNORMAL LOW (ref >60)
GFR calc non Af Amer: 44 mL/min — ABNORMAL LOW (ref >60)
Glucose, Bld: 151 mg/dL — ABNORMAL HIGH (ref 70–99)
Potassium: 3.9 mmol/L (ref 3.5–5.1)
Sodium: 137 mmol/L (ref 135–145)

## 2019-09-11 LAB — URINALYSIS, ROUTINE W REFLEX MICROSCOPIC
Bilirubin Urine: NEGATIVE
Glucose, UA: NEGATIVE mg/dL
Hgb urine dipstick: NEGATIVE
Ketones, ur: NEGATIVE mg/dL
Leukocytes,Ua: NEGATIVE
Nitrite: NEGATIVE
Protein, ur: NEGATIVE mg/dL
Specific Gravity, Urine: 1.019 (ref 1.005–1.030)
pH: 5 (ref 5.0–8.0)

## 2019-09-11 LAB — LACTIC ACID, PLASMA
Lactic Acid, Venous: 1.8 mmol/L (ref 0.5–1.9)
Lactic Acid, Venous: 4.3 mmol/L (ref 0.5–1.9)

## 2019-09-11 LAB — C DIFFICILE QUICK SCREEN W PCR REFLEX
C Diff antigen: POSITIVE — AB
C Diff toxin: NEGATIVE

## 2019-09-11 LAB — CLOSTRIDIUM DIFFICILE BY PCR, REFLEXED: Toxigenic C. Difficile by PCR: POSITIVE — AB

## 2019-09-11 LAB — MRSA PCR SCREENING: MRSA by PCR: NEGATIVE

## 2019-09-11 LAB — PROCALCITONIN: Procalcitonin: 4.53 ng/mL

## 2019-09-11 LAB — APTT: aPTT: 57 seconds — ABNORMAL HIGH (ref 24–36)

## 2019-09-11 MED ORDER — NOREPINEPHRINE 16 MG/250ML-% IV SOLN
0.0000 ug/min | INTRAVENOUS | Status: DC
  Administered 2019-09-12: 7 ug/min via INTRAVENOUS
  Administered 2019-09-12: 33 ug/min via INTRAVENOUS
  Filled 2019-09-11 (×2): qty 250

## 2019-09-11 MED ORDER — ACYCLOVIR 400 MG PO TABS
400.0000 mg | ORAL_TABLET | Freq: Two times a day (BID) | ORAL | Status: DC
  Administered 2019-09-11 – 2019-09-20 (×18): 400 mg via ORAL
  Filled 2019-09-11 (×19): qty 1

## 2019-09-11 MED ORDER — POLYETHYLENE GLYCOL 3350 17 G PO PACK: 17.0000 g | PACK | Freq: Every day | ORAL | Status: DC | PRN

## 2019-09-11 MED ORDER — SODIUM CHLORIDE 0.9 % IV SOLN
2.0000 g | Freq: Once | INTRAVENOUS | Status: AC
  Administered 2019-09-11: 2 g via INTRAVENOUS
  Filled 2019-09-11: qty 2

## 2019-09-11 MED ORDER — LEVOTHYROXINE SODIUM 88 MCG PO TABS
88.0000 ug | ORAL_TABLET | Freq: Every day | ORAL | Status: DC
  Administered 2019-09-12 – 2019-09-20 (×9): 88 ug via ORAL
  Filled 2019-09-11 (×9): qty 1

## 2019-09-11 MED ORDER — FENTANYL CITRATE (PF) 100 MCG/2ML IJ SOLN
50.0000 ug | Freq: Once | INTRAMUSCULAR | Status: AC
  Administered 2019-09-11: 50 ug via INTRAVENOUS
  Filled 2019-09-11: qty 2

## 2019-09-11 MED ORDER — METRONIDAZOLE IN NACL 5-0.79 MG/ML-% IV SOLN
500.0000 mg | Freq: Once | INTRAVENOUS | Status: AC
  Administered 2019-09-11: 500 mg via INTRAVENOUS
  Filled 2019-09-11: qty 100

## 2019-09-11 MED ORDER — VASOPRESSIN 20 UNITS/100 ML INFUSION FOR SHOCK
0.0300 [IU]/min | INTRAVENOUS | Status: DC
  Administered 2019-09-11 – 2019-09-12 (×2): 0.03 [IU]/min via INTRAVENOUS
  Filled 2019-09-11 (×3): qty 100

## 2019-09-11 MED ORDER — PANTOPRAZOLE SODIUM 40 MG PO TBEC
40.0000 mg | DELAYED_RELEASE_TABLET | Freq: Every morning | ORAL | Status: DC
  Administered 2019-09-12 – 2019-09-14 (×3): 40 mg via ORAL
  Filled 2019-09-11 (×3): qty 1

## 2019-09-11 MED ORDER — VANCOMYCIN 50 MG/ML ORAL SOLUTION
125.0000 mg | Freq: Four times a day (QID) | ORAL | Status: DC
  Administered 2019-09-11 – 2019-09-20 (×37): 125 mg via ORAL
  Filled 2019-09-11 (×42): qty 2.5

## 2019-09-11 MED ORDER — ASPIRIN 81 MG PO CHEW
81.0000 mg | CHEWABLE_TABLET | Freq: Every day | ORAL | Status: DC
  Administered 2019-09-12 – 2019-09-14 (×3): 81 mg via ORAL
  Filled 2019-09-11 (×3): qty 1

## 2019-09-11 MED ORDER — ORAL CARE MOUTH RINSE
15.0000 mL | Freq: Two times a day (BID) | OROMUCOSAL | Status: DC
  Administered 2019-09-11 – 2019-09-20 (×17): 15 mL via OROMUCOSAL

## 2019-09-11 MED ORDER — VANCOMYCIN HCL 1500 MG/300ML IV SOLN
1500.0000 mg | INTRAVENOUS | Status: DC
  Filled 2019-09-11: qty 300

## 2019-09-11 MED ORDER — HYDROCORTISONE NA SUCCINATE PF 100 MG IJ SOLR
50.0000 mg | Freq: Four times a day (QID) | INTRAMUSCULAR | Status: DC
  Administered 2019-09-11 – 2019-09-14 (×11): 50 mg via INTRAVENOUS
  Filled 2019-09-11 (×11): qty 2

## 2019-09-11 MED ORDER — DOCUSATE SODIUM 100 MG PO CAPS: 100.0000 mg | ORAL_CAPSULE | Freq: Two times a day (BID) | ORAL | Status: DC | PRN

## 2019-09-11 MED ORDER — IOHEXOL 300 MG/ML  SOLN
100.0000 mL | Freq: Once | INTRAMUSCULAR | Status: AC | PRN
  Administered 2019-09-11: 100 mL via INTRAVENOUS

## 2019-09-11 MED ORDER — LACTATED RINGERS IV BOLUS (SEPSIS)
1000.0000 mL | Freq: Once | INTRAVENOUS | Status: AC
  Administered 2019-09-11: 1000 mL via INTRAVENOUS

## 2019-09-11 MED ORDER — FENTANYL CITRATE (PF) 100 MCG/2ML IJ SOLN
25.0000 ug | Freq: Once | INTRAMUSCULAR | Status: DC
  Filled 2019-09-11: qty 2

## 2019-09-11 MED ORDER — ATORVASTATIN CALCIUM 40 MG PO TABS
40.0000 mg | ORAL_TABLET | Freq: Every day | ORAL | Status: DC
  Administered 2019-09-12 – 2019-09-20 (×9): 40 mg via ORAL
  Filled 2019-09-11 (×9): qty 1

## 2019-09-11 MED ORDER — FENTANYL CITRATE (PF) 100 MCG/2ML IJ SOLN
100.0000 ug | INTRAMUSCULAR | Status: DC | PRN
  Administered 2019-09-11: 100 ug via INTRAVENOUS
  Filled 2019-09-11: qty 2

## 2019-09-11 MED ORDER — LACTATED RINGERS IV BOLUS
2000.0000 mL | Freq: Once | INTRAVENOUS | Status: AC
  Administered 2019-09-11: 2000 mL via INTRAVENOUS

## 2019-09-11 MED ORDER — NOREPINEPHRINE 4 MG/250ML-% IV SOLN
0.0000 ug/min | INTRAVENOUS | Status: DC
  Administered 2019-09-11: 6 ug/min via INTRAVENOUS
  Administered 2019-09-11: 35 ug/min via INTRAVENOUS
  Administered 2019-09-11: 38 ug/min via INTRAVENOUS
  Administered 2019-09-11: 37 ug/min via INTRAVENOUS
  Filled 2019-09-11: qty 500
  Filled 2019-09-11: qty 250
  Filled 2019-09-11: qty 500

## 2019-09-11 MED ORDER — VANCOMYCIN HCL 2000 MG/400ML IV SOLN
2000.0000 mg | Freq: Once | INTRAVENOUS | Status: AC
  Administered 2019-09-11: 2000 mg via INTRAVENOUS
  Filled 2019-09-11: qty 400

## 2019-09-11 MED ORDER — LACTATED RINGERS IV BOLUS
500.0000 mL | Freq: Once | INTRAVENOUS | Status: AC
  Administered 2019-09-11: 500 mL via INTRAVENOUS

## 2019-09-11 MED ORDER — ALLOPURINOL 300 MG PO TABS
300.0000 mg | ORAL_TABLET | Freq: Every morning | ORAL | Status: DC
  Administered 2019-09-12 – 2019-09-20 (×9): 300 mg via ORAL
  Filled 2019-09-11 (×7): qty 1
  Filled 2019-09-11: qty 3
  Filled 2019-09-11: qty 1
  Filled 2019-09-11: qty 3
  Filled 2019-09-11: qty 1
  Filled 2019-09-11: qty 3

## 2019-09-11 MED ORDER — MORPHINE SULFATE (PF) 4 MG/ML IV SOLN: 4.0000 mg | INTRAVENOUS | Status: DC | PRN

## 2019-09-11 MED ORDER — CHLORHEXIDINE GLUCONATE CLOTH 2 % EX PADS
6.0000 | MEDICATED_PAD | Freq: Every day | CUTANEOUS | Status: DC
  Administered 2019-09-11 – 2019-09-20 (×10): 6 via TOPICAL

## 2019-09-11 MED ORDER — ONDANSETRON HCL 4 MG/2ML IJ SOLN: 4.0000 mg | Freq: Four times a day (QID) | INTRAMUSCULAR | Status: DC | PRN

## 2019-09-11 MED ORDER — VANCOMYCIN HCL IN DEXTROSE 1-5 GM/200ML-% IV SOLN: 1000.0000 mg | Freq: Once | INTRAVENOUS | Status: DC

## 2019-09-11 MED ORDER — LACTATED RINGERS IV SOLN: INTRAVENOUS | Status: DC

## 2019-09-11 MED ORDER — SODIUM CHLORIDE 0.9 % IV SOLN
2.0000 g | Freq: Three times a day (TID) | INTRAVENOUS | Status: DC
  Administered 2019-09-11 – 2019-09-12 (×2): 2 g via INTRAVENOUS
  Filled 2019-09-11 (×3): qty 2

## 2019-09-11 NOTE — Sepsis Progress Note (Signed)
Notified bedside nurse of need to draw repeat lactic acid since results are trending up, .

## 2019-09-11 NOTE — H&P (Addendum)
NAME:  Zachary Willis, MRN:  [id number], DOB:  May 11, 1945, LOS: 0 ADMISSION DATE:  09/11/2019, CONSULTATION DATE:  09/11/19 REFERRING MD:  ED - Dr. 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  History of present illness   Patient was in normal state of health.  Undergoing chemotherapy, 12 cycles currently for metastatic colon cancer.  He has had surgical resection in the past.  He often has diarrhea after his chemotherapy treatments.  However over the last couple of days the diarrhea has been much more severe, higher volume.  Yesterday his blood pressure was low but lower than normal when wife checked in the morning.  She attributed this to the diarrhea and dehydration.  However, this morning blood pressure continue to be low and he had a fever over 100 F.  He appeared similar to other times he has been admitted with infections to the hospital.  She called EMS and he was transported to [facility] long ED.  In the emergency room he was noted to be hypotensive.  He received about 3 L of IV fluid bolus with persistent hypotension.  Norepinephrine was started.  Blood culture obtained.  Urine culture obtained, UA clean.  Chest x-ray clear.  CT abdomen pelvis ordered.  Given diarrhea C. difficile was obtained with indeterminate results on screen, PCR pending.  Other notable labs, INR greater than 8 on warfarin.  Kidney function near baseline.  Hemoglobin stable at baseline, leukopenic with neutropenia 0.5.  Chronic thrombocytopenia stable.  Currently, norepinephrine dose increasing despite fluids.  He is warm on exam.  No lower  extremity edema.  Habitus precludes accurate JVP assessment.  Bedside ultrasound with very poor windows but what appears to be small and collapsible IVC, reasonably normal ejection fraction on parasternal long and short views.  He received vancomycin, cefepime, and Flagyl IV.  These medicines continue on admission.  Given indeterminate C. difficile screen, oral vancomycin ordered on admission.  Additionally, noted to have adrenal insufficiency in the setting of pituitary tumor resection on hydrocortisone at home.  Ordered and personally observed IV hydrocortisone administered in the ED room.  Lactic acid rising unsurprisingly in setting of escalating pressor doses.  Past Medical History  CAD, colon cancer, pituitary tumor status post resection, ischemic cardiomyopathy  Significant Hospital Events   9/8-presents to ED, hypotensive, pressure started, admitted to ICU  Consults:  N/A  Procedures:  IO placement in right humerus in ED  Significant Diagnostic Tests:  Test x-ray clear, UA clean, C. difficile screen indeterminate  Micro Data:  Cultures pending  Antimicrobials:  IV cefepime vancomycin Flagyl p.o. vancomycin 9/8--  Interim history/subjective:  n/a  Objective   Blood pressure 105/82, pulse (!) 124, temperature 99.7 F (37.6 C), temperature source Oral, resp. rate 18, height 5\' 10"  (1.778 m), weight 116 kg, SpO2 95 %.        Intake/Output Summary (Last 24 hours) at 09/11/2019 1556 Last data filed at 09/11/2019 1537 Gross per 24 hour  Intake 3647.22 ml  Output --  Net 3647.22 ml   Filed Weights   09/11/19 1032  Weight: 116 kg    Examination: General:  Lying in bed, mild distress Eyes: EOMI, mild icterus Neck: Thick, cannot appreciate JVP Respiratory: Clear aspiration bilaterally, no wheezes or crackles Cardiovascular: Irregularly irregular, tachycardic, no murmurs Abdomen: Obese, nontender Extremities: Warm, no lower extremity edema, IO catheter in right  humerus Neuro: No deficits in extremities, right eye droop and right facial droop chronic after pituitary surgery per wife at bedside Psych: Drowsy, normal mood, pleasant  Resolved Hospital Problem list   N/A  Assessment & Plan:  Multifactorial shock-septic and hypovolemic in the setting of diarrhea exacerbated by adrenal insufficiency: Chest x-ray clear, UA clear, no focal signs on exam.  Having diarrhea so presumed intra-abdominal source, C Diff indeterminate, PCR pending. --Vancomycin, cefepime, Flagyl IV, p.o. vancomycin also ordered while awaiting formal PCR for C. Difficile --MAP greater than 65, norepinephrine, anticipate adding vasopressin --IV hydrocortisone 50 mg every 6 hours --Status post 3 L IV fluid bolus in ED, anticipate ordering additional fluid bolus given he is warm on exam and IVC appear small and collapsible on bedside ultrasound, must be cautious given history of mild cardiomyopathy in the past  Febrile neutropenia: In the setting of recent chemotherapy.  Received Neulasta a few days ago.  Antibiotics as above.  Adrenal insufficiency: Secondary to pituitary tumor resection in past.  At home takes 20 mg and 10 mg in the morning and afternoon respectively of hydrocortisone. --Stress dose steroids as above, wean as tolerated  Hypothyroidism: Continue Synthroid  Ischemic cardiomyopathy: EF 45% in 2020.  Bedside ultrasound with what appears to be pretty normal EF with admittedly poor windows. --Hold home carvedilol, Lasix given hypotension --Formal TTE ordered  Atrial fibrillation with RVR: A. fib is chronic.  On warfarin at home for stroke prophylaxis, INR elevated greater than 8 on admission.  No active signs or symptoms of bleeding.  Hemoglobin stable/at baseline on initial check. --Hold warfarin, daily INR --Hold carvedilol given hypotension --Consider amnio IV bolus/drip if heart rates continue to be uncontrolled, favor continue norepinephrine for now given  relatively high requirements for hypotension  Best practice:  Diet: N.p.o. for now Pain/Anxiety/Delirium protocol (if indicated): Morphine as needed VAP protocol (if indicated): N/A DVT prophylaxis: Fully anticoagulated with warfarin, elevated INR GI prophylaxis: N/A Glucose control: N/A Mobility: Bedrest Code Status: Full Family Communication: With wife at bedside Disposition: Admit to ICU  Labs   CBC: Recent Labs  Lab 09/11/19 1052  WBC 1.4*  NEUTROABS 0.5*  HGB 10.4*  HCT 32.6*  MCV 95.6  PLT 74*    Basic Metabolic Panel: Recent Labs  Lab 09/11/19 1052  NA 136  K 3.0*  CL 103  CO2 25  GLUCOSE 125*  BUN 25*  CREATININE 1.23  CALCIUM 8.0*   GFR: Estimated Creatinine Clearance: 70.3 mL/min (by C-G formula based on SCr of 1.23 mg/dL). Recent Labs  Lab 09/11/19 1052 09/11/19 1400  WBC 1.4*  --   LATICACIDVEN 1.8 4.3*    Liver Function Tests: Recent Labs  Lab 09/11/19 1052  AST 25  ALT 36  ALKPHOS 184*  BILITOT 3.1*  PROT 4.7*  ALBUMIN 2.7*   No results for input(s): LIPASE, AMYLASE in the last 168 hours. No results for input(s): AMMONIA in the last 168 hours.  ABG No results found for: PHART, PCO2ART, PO2ART, HCO3, TCO2, ACIDBASEDEF, O2SAT   Coagulation Profile: Recent Labs  Lab 09/11/19 1052  INR 8.7*    Cardiac Enzymes: No results for input(s): CKTOTAL, CKMB, CKMBINDEX, TROPONINI in the last 168 hours.  HbA1C: No results found for: HGBA1C  CBG: No results for input(s): GLUCAP in the last 168 hours.  Review of Systems:   Less than ideal due to patient factors, drowsy after pain meds.  Did endorse ongoing diarrhea and fever at home.  Mild abdominal pain at home none present currently.  Past Medical History  He,  has a past medical history of Anticoagulated on Coumadin, Antineoplastic chemotherapy induced anemia, Chronic atrial fibrillation ([place]), CKD (chronic kidney disease), stage II, Colon cancer metastasized to liver ([place]),  Cushing's disease ([place]), Droopy eyelid, right, GERD (gastroesophageal reflux disease), Goals of care, counseling/discussion (08/30/2017), Gout, History of benign pituitary tumor, History of cancer chemotherapy, History of radiation therapy, Hypertension, Hypertensive cardiovascular disease, Hypothyroidism, secondary, OA (osteoarthritis), Renal insufficiency, Secondary adrenal insufficiency ([place]), Secondary male hypogonadism, Wears glasses, and Wears partial dentures.   Surgical History    Past Surgical History:  Procedure Laterality Date  . COLOSTOMY TAKEDOWN  05-16-2016   @[facility]   . IR 3D INDEPENDENT WKST  07/17/2018  . IR ANGIOGRAM SELECTIVE EACH ADDITIONAL VESSEL  07/17/2018  . IR ANGIOGRAM SELECTIVE EACH ADDITIONAL VESSEL  07/17/2018  . IR ANGIOGRAM SELECTIVE EACH ADDITIONAL VESSEL  07/17/2018  . IR ANGIOGRAM SELECTIVE EACH ADDITIONAL VESSEL  07/17/2018  . IR ANGIOGRAM SELECTIVE EACH ADDITIONAL VESSEL  07/17/2018  . IR ANGIOGRAM SELECTIVE EACH ADDITIONAL VESSEL  07/30/2018  . IR ANGIOGRAM SELECTIVE EACH ADDITIONAL VESSEL  07/30/2018  . IR ANGIOGRAM SELECTIVE EACH ADDITIONAL VESSEL  07/30/2018  . IR ANGIOGRAM SELECTIVE EACH ADDITIONAL VESSEL  07/30/2018  . IR ANGIOGRAM SELECTIVE EACH ADDITIONAL VESSEL  07/30/2018  . IR ANGIOGRAM SELECTIVE EACH ADDITIONAL VESSEL  08/27/2018  . IR ANGIOGRAM SELECTIVE EACH ADDITIONAL VESSEL  08/27/2018  . IR ANGIOGRAM VISCERAL SELECTIVE  07/17/2018  . IR ANGIOGRAM VISCERAL SELECTIVE  07/17/2018  . IR ANGIOGRAM VISCERAL SELECTIVE  08/27/2018  . IR EMBO ARTERIAL NOT HEMORR HEMANG INC GUIDE ROADMAPPING  07/17/2018  . IR EMBO TUMOR ORGAN ISCHEMIA INFARCT INC GUIDE ROADMAPPING  07/30/2018  . IR EMBO TUMOR ORGAN ISCHEMIA INFARCT INC GUIDE ROADMAPPING  08/27/2018  . IR RADIOLOGIST EVAL & MGMT  10/10/2017  . IR RADIOLOGIST EVAL & MGMT  11/07/2017  . IR RADIOLOGIST EVAL & MGMT  12/21/2017  . IR RADIOLOGIST EVAL & MGMT  06/19/2018  . IR RADIOLOGIST EVAL & MGMT  09/19/2018  . IR  RADIOLOGIST EVAL & MGMT  12/18/2018  . IR US GUIDE VASC ACCESS RIGHT  07/17/2018  . IR US GUIDE VASC ACCESS RIGHT  07/30/2018  . IR US GUIDE VASC ACCESS RIGHT  08/27/2018  . OPEN PARTIAL HEPATECTOMY   01-13-2017    @[facility]    AND CHOLECYSTECTOMY  . RADIOFREQUENCY ABLATION N/A 11/22/2017   Procedure: CT MICROWAVE ABLATION LIVER;  Surgeon: Sandi Mariscal, MD;  Location: [facility];  Service: Anesthesiology;  Laterality: N/A;  . REVISION TOTAL KNEE ARTHROPLASTY Left 12/20/2013  . SUBTOTAL COLECTOMY  01-01-2016  @[facility]    W/  CREATION ILEOSTOMY AND UMBILICAL HERNIA REPAIR  . TOTAL KNEE ARTHROPLASTY Bilateral left 11-16-2010;  right 10-26-2011  . TRANSPHENOIDAL PITUITARY RESECTION  07-25-2013   @[facility]     Social History   reports that he has never smoked. He has never used smokeless tobacco. He reports previous alcohol use. He reports that he does not use drugs.   Family History   His family history is not on file.    Allergies No Known Allergies   Home Medications  Prior to Admission medications   Medication Sig Start Date End Date Taking? Authorizing Provider  acyclovir (  ZOVIRAX) 400 MG tablet TAKE 1 TABLET BY MOUTH TWICE A DAY Patient taking differently: Take 400 mg by mouth 2 (two) times daily.  05/22/19  Yes [name], MD  allopurinol (ZYLOPRIM) 300 MG tablet Take 300 mg by mouth every morning.  01/28/10  Yes [provider]  atorvastatin (LIPITOR) 40 MG tablet Take 40 mg by mouth daily. 03/22/18  Yes [provider]  carvedilol (COREG) 6.25 MG tablet Take 6.25 mg by mouth 2 (two) times daily with a meal.  05/31/18  Yes [provider]  [facility] ASPIRIN ADULT LOW DOSE 81 MG chewable tablet Chew 81 mg by mouth daily. 02/07/18  Yes [provider]  dexamethasone (DECADRON) 4 MG tablet Take 2 tablets (8 mg total) by mouth daily. Start the day after chemo for 2 days. 08/15/19  Yes [name], MD  ferrous sulfate 325 (65 FE) MG tablet Take 325 mg by mouth daily.    Yes [provider]  furosemide (LASIX) 40 MG tablet Take 40 mg by mouth daily. 07/30/19  Yes [provider]  hydrocortisone (CORTEF) 10 MG tablet Take 5-15 mg by mouth See admin instructions. Takes 15 mg in am and 5mg  in afternoon. 08/19/17  Yes [provider]  ibuprofen (ADVIL) 200 MG tablet Take 400 mg by mouth every 6 (six) hours as needed for headache or mild pain.   Yes [provider]  levothyroxine (SYNTHROID, LEVOTHROID) 88 MCG tablet Take 88 mcg by mouth daily before breakfast.  05/15/17  Yes [provider]  loperamide (IMODIUM A-D) 2 MG tablet Take 1 tablet (2 mg total) by mouth 4 (four) times daily as needed. Take 2 at diarrhea onset , then 1 every 2hr until 12hrs with no BM. May take 2 every 4hrs at night. If diarrhea recurs repeat. Patient taking differently: Take 2 mg by mouth 4 (four) times daily as needed for diarrhea or loose stools. Take 2 at diarrhea onset , then 1 every 2hr until 12hrs with no BM. May take 2 every 4hrs at night. If diarrhea recurs repeat. 12/18/18  Yes [name], MD  NASAL [facility] NA Place 1 spray into the nose daily.    Yes [provider]  pantoprazole (PROTONIX) 40 MG tablet Take 40 mg by mouth every morning.  05/16/14  Yes [provider]  polyvinyl alcohol (LIQUIFILM TEARS) 1.4 % ophthalmic solution Place 1 drop into both eyes daily as needed for dry eyes.    Yes [provider]  potassium chloride (KLOR-CON 10) 10 MEQ tablet Take 20 mEq by mouth daily. 11/17/14  Yes [provider]  testosterone (ANDRODERM) 4 MG/24HR PT24 patch Place 1 patch onto the skin daily.  04/17/15  Yes [provider]  warfarin (COUMADIN) 5 MG tablet Take 2.5-5 mg by mouth See admin instructions. 5mg  alternating with 2.5mg  every other day 07/05/17  Yes [provider]  lidocaine-prilocaine (EMLA) cream Apply to affected area once Patient taking differently: Apply 1 application  topically as needed (port access). Apply to affected area once 12/18/18   [name], MD  ondansetron (ZOFRAN) 8 MG tablet Take 1 tablet (8 mg total) by mouth 2 (two) times daily as needed for refractory nausea / vomiting. Start on day 3 after chemotherapy. 12/18/18   [name], MD  prochlorperazine (COMPAZINE) 10 MG tablet Take 1 tablet (10 mg total) by mouth every 6 (six) hours as needed (NAUSEA). 12/18/18   [name], MD  Critical care time:      CRITICAL CARE Performed by: Lanier Clam   Total critical care time: 53 minutes  Critical care time was exclusive of separately billable procedures and treating other patients.  Critical care was necessary to treat or prevent imminent or life-threatening deterioration.  Critical care was time spent personally by me on the following activities: development of treatment plan with patient and/or surrogate as well as nursing, discussions with consultants, evaluation of patient's response to treatment, examination of patient, obtaining history from patient or surrogate, ordering and performing treatments and interventions, ordering and review of laboratory studies, ordering and review of radiographic studies, pulse oximetry and re-evaluation of patient's condition.

## 2019-09-11 NOTE — Telephone Encounter (Signed)
Call received from patient's wife stating that she has called an ambulance d/t she was unable to get pt up to come to office today and that ambulance is taking pt to the [facility] ED now.  Dr. Marin Olp notified.

## 2019-09-11 NOTE — Progress Notes (Signed)
Pharmacy Antibiotic Note  Zachary Willis is a 74 y.o. male with a h/o metastatic colon cancer with last chemo 10 days ago admitted on 09/11/2019 with sepsis.  Pharmacy has been consulted for vancomycin dosing.  Plan: Vancomycin 2000 mg iv once followed by 1500 mg iv q 24 hours Predicted AUC 501  Cefepime 2 g iv q 8h per MD  F/U renal function, culture results, clinical course  Height: 5\' 10"  (177.8 cm) Weight: 116 kg (255 lb 11.7 oz) IBW/kg (Calculated) : 73  Temp (24hrs), Avg:99.7 F (37.6 C), Min:99.7 F (37.6 C), Max:99.7 F (37.6 C)  Recent Labs  Lab 09/11/19 1052 09/11/19 1400  WBC 1.4*  --   CREATININE 1.23  --   LATICACIDVEN 1.8 4.3*    Estimated Creatinine Clearance: 70.3 mL/min (by C-G formula based on SCr of 1.23 mg/dL).    No Known Allergies  Antimicrobials this admission: cefepime 9/8 >>  vancomycin 9/8 >>  Metronidazole 9/8 x 1  Dose adjustments this admission:   Microbiology results: 9/8 BCx: sent 9/8 UCx: sent  9/8 C diff: antigen pos, toxin neg, PCR pending 9/8 COVID: neg  Thank you for allowing pharmacy to be a part of this patient's care.  Ulice Dash D 09/11/2019 3:14 PM

## 2019-09-11 NOTE — ED Triage Notes (Signed)
Arrives via [facility] from home, patient is normally alert and oriented, ambulatory. Has had N/V/D x3 days, as well as a fever. Had both COVID-19 vaccinations. Colon cancer patient.

## 2019-09-11 NOTE — Telephone Encounter (Signed)
Call received from patient's wife stating that pt has had diarrhea times three days despite using Imodium, started with N/V during the night last night and this AM and would like to know if pt can come in for IVF's.  Dr. Marin Olp notified and order received for pt to come in for labs and IVF's today.  Pt.'s wife states that she will have pt at office within an hour.  Message sent to scheduling.

## 2019-09-11 NOTE — ED Notes (Signed)
Unable to obtain second set of blood cultures, abx initaiated w/o delay.

## 2019-09-11 NOTE — Sepsis Progress Note (Signed)
Notified bedside nurse of need to draw repeat lactic acid. 

## 2019-09-11 NOTE — Progress Notes (Signed)
A consult was received from an ED physician for vancomycin and cefepime per pharmacy dosing.  The patient's profile has been reviewed for ht/wt/allergies/indication/available labs.   A one time order has been placed for Vancomycin 2g IV, and Cefepime 2g IV.  Further antibiotics/pharmacy consults should be ordered by admitting physician if indicated.                       Thank you,  Gretta Arab PharmD, BCPS Clinical Pharmacist [facility] main pharmacy [phone] 09/11/2019 10:47 AM

## 2019-09-11 NOTE — ED Notes (Signed)
Date and time results received: 09/11/19 11:48 AM  (use smartphrase ".now" to insert current time)  Test: INR & WBC Critical Value: 8.7 & 1.4, respectively  Name of Provider Notified: Dr. Vanita Panda  Orders Received? Or Actions Taken?:

## 2019-09-11 NOTE — ED Provider Notes (Signed)
[facility] DEPT Provider Note   CSN: [id number] Arrival date & time: 09/11/19  1017     History No chief complaint on file.   Zachary Willis is a 73 y.o. male.  HPI    Patient presents via EMS.  History is obtained by the patient and EMS providers as well as on chart review.  Most recent oncology update as below. Patient has a history of metastatic colon cancer with involvement of the liver.  Last chemotherapy was about 10 days ago, though is also on home chemotherapy. With past day the patient has developed weakness, nausea, vomiting, without pain. On arrival patient tachycardic, febrile, hypotensive.  He received 300 ml normal saline via right upper arm IO. Patient self offers only brief single word minimally intelligible responses, but seemingly denies pain, acknowledges weakness, denies current nausea. Level 5 caveat secondary to acuity of condition.  Hematology and Oncology Follow Up Visit   Zachary Willis [id number] November 12, 1945 74 y.o. 09/03/2019     Principle Diagnosis:  Metastatic colon cancer - progressive   Past Therapy: Status post RFA to the liver-11/21/2017  S/P Y-90 x 2 -- last therapy on 08/27/2018   Current Therapy:        FOLFIRI -- started on 01/09/2019, s/p cycle #11              Interim History:  Zachary Willis is here today for follow-up.  Overall, he is doing okay.  We did do a CT scan on him.  This was done on August 30.  Overall, the CT scan looked relatively stable.  There is no new areas of disease.  He has stable pulmonary nodules.  He has stable hepatic metastasis.   He has had no cardiac issues.  He has had no chest wall pain.   His last CEA was up a little bit at 89 but his tends to go up and down.   He has had no change in bowel or bladder habits.  There has been no bleeding.  He has had no increased leg swelling.  He has had no headache.  There has been no mouth sores.   Overall, I would say his performance status is  ECOG 1.     Past Medical History:  Diagnosis Date  . Anticoagulated on Coumadin    chronic long term  . Antineoplastic chemotherapy induced anemia   . Chronic atrial fibrillation [facility])    cardiologist--  dr Curly Rim [facility] in [place])--- hx DVVC in 2006/  TEE 10-21-2015 (care everywhere)  ef 55-60%, moderate dilated RA, mild MR, RVSP 57mmHg  . CKD (chronic kidney disease), stage II   . Colon cancer metastasized to liver [facility])    current oncologist-- dr Marin Olp (previous oncologist at [facility] oncology)--- dx 12/ 2017,  Stage IIb ([id number]),  s/p  resection colon tumor 01-01-2016 ,  recurrent w/ liver mets via bx 08/ 2018  , had chemo then partial hepatectomy and completed chemo after surgery/ liver mets recurrence 08/ 2019  . Cushing's disease [facility])    endocriniologist-- dr Elisabeth Most [facility])  . Droopy eyelid, right    11-15-2017  . GERD (gastroesophageal reflux disease)   . Goals of care, counseling/discussion 08/30/2017  . Gout    11-15-2017 per pt last flare-up 2018  . History of benign pituitary tumor    resection 07-25-2013  . History of cancer chemotherapy    COMPLETED 02-20-2017  FOR  COLON CANCER WITH LIVER METS  . History of  radiation therapy    03/ 2016  remainder of pituitary tumor  . Hypertension   . Hypertensive cardiovascular disease   . Hypothyroidism, secondary   . OA (osteoarthritis)   . Renal insufficiency   . Secondary adrenal insufficiency ([place])   . Secondary male hypogonadism   . Wears glasses   . Wears partial dentures    upper and lower    Patient Active Problem List   Diagnosis Date Noted  . Colon cancer metastasized to liver ([place]) 08/30/2017  . Goals of care, counseling/discussion 08/30/2017  . NEPHROLITHIASIS 12/28/2009  . ABDOMINAL PAIN, RIGHT LOWER QUADRANT 12/28/2009    Past Surgical History:  Procedure Laterality Date  . COLOSTOMY TAKEDOWN  05-16-2016   @[facility]   . IR 3D INDEPENDENT [facility]KST  07/17/2018  . IR ANGIOGRAM SELECTIVE EACH  ADDITIONAL VESSEL  07/17/2018  . IR ANGIOGRAM SELECTIVE EACH ADDITIONAL VESSEL  07/17/2018  . IR ANGIOGRAM SELECTIVE EACH ADDITIONAL VESSEL  07/17/2018  . IR ANGIOGRAM SELECTIVE EACH ADDITIONAL VESSEL  07/17/2018  . IR ANGIOGRAM SELECTIVE EACH ADDITIONAL VESSEL  07/17/2018  . IR ANGIOGRAM SELECTIVE EACH ADDITIONAL VESSEL  07/30/2018  . IR ANGIOGRAM SELECTIVE EACH ADDITIONAL VESSEL  07/30/2018  . IR ANGIOGRAM SELECTIVE EACH ADDITIONAL VESSEL  07/30/2018  . IR ANGIOGRAM SELECTIVE EACH ADDITIONAL VESSEL  07/30/2018  . IR ANGIOGRAM SELECTIVE EACH ADDITIONAL VESSEL  07/30/2018  . IR ANGIOGRAM SELECTIVE EACH ADDITIONAL VESSEL  08/27/2018  . IR ANGIOGRAM SELECTIVE EACH ADDITIONAL VESSEL  08/27/2018  . IR ANGIOGRAM VISCERAL SELECTIVE  07/17/2018  . IR ANGIOGRAM VISCERAL SELECTIVE  07/17/2018  . IR ANGIOGRAM VISCERAL SELECTIVE  08/27/2018  . IR EMBO ARTERIAL NOT HEMORR HEMANG INC GUIDE ROADMAPPING  07/17/2018  . IR EMBO TUMOR ORGAN ISCHEMIA INFARCT INC GUIDE ROADMAPPING  07/30/2018  . IR EMBO TUMOR ORGAN ISCHEMIA INFARCT INC GUIDE ROADMAPPING  08/27/2018  . IR RADIOLOGIST EVAL & MGMT  10/10/2017  . IR RADIOLOGIST EVAL & MGMT  11/07/2017  . IR RADIOLOGIST EVAL & MGMT  12/21/2017  . IR RADIOLOGIST EVAL & MGMT  06/19/2018  . IR RADIOLOGIST EVAL & MGMT  09/19/2018  . IR RADIOLOGIST EVAL & MGMT  12/18/2018  . IR US GUIDE VASC ACCESS RIGHT  07/17/2018  . IR US GUIDE VASC ACCESS RIGHT  07/30/2018  . IR US GUIDE VASC ACCESS RIGHT  08/27/2018  . OPEN PARTIAL HEPATECTOMY   01-13-2017    @[facility]    AND CHOLECYSTECTOMY  . RADIOFREQUENCY ABLATION N/A 11/22/2017   Procedure: CT MICROWAVE ABLATION LIVER;  Surgeon: Sandi Mariscal, MD;  Location: [facility] ORS;  Service: Anesthesiology;  Laterality: N/A;  . REVISION TOTAL KNEE ARTHROPLASTY Left 12/20/2013  . SUBTOTAL COLECTOMY  01-01-2016  @[facility]    W/  CREATION ILEOSTOMY AND UMBILICAL HERNIA REPAIR  . TOTAL KNEE ARTHROPLASTY Bilateral left 11-16-2010;  right 10-26-2011  . TRANSPHENOIDAL  PITUITARY RESECTION  07-25-2013   @[facility]       No family history on file.  Social History   Tobacco Use  . Smoking status: Never Smoker  . Smokeless tobacco: Never Used  Vaping Use  . Vaping Use: Never used  Substance Use Topics  . Alcohol use: Not Currently  . Drug use: Never    Home Medications Prior to Admission medications   Medication Sig Start Date End Date Taking? Authorizing Provider  acyclovir (ZOVIRAX) 400 MG tablet TAKE 1 TABLET BY MOUTH TWICE A DAY 05/22/19   [name], MD  allopurinol (ZYLOPRIM) 300 MG tablet Take 300 mg by mouth every  morning.  01/28/10   [provider]  atorvastatin (LIPITOR) 40 MG tablet Take 40 mg by mouth daily. 03/22/18   [provider]  carvedilol (COREG) 6.25 MG tablet TAKE 1 TABLET (6.25 MG TOTAL) BY MOUTH 2 TIMES DAILY WITH MEALS. 05/31/18   [provider]  [facility] ASPIRIN ADULT LOW DOSE 81 MG chewable tablet Chew 81 mg by mouth daily. 02/07/18   [provider]  [facility] STOOL SOFTENER/LAXATIVE 8.6-50 MG tablet TAKE 2 TABLETS EVERY EVENING 03/22/18   [provider]  dexamethasone (DECADRON) 4 MG tablet Take 2 tablets (8 mg total) by mouth daily. Start the day after chemo for 2 days. 08/15/19   [name], MD  ferrous sulfate 325 (65 FE) MG tablet Take 325 mg by mouth daily.    [provider]  furosemide (LASIX) 40 MG tablet Take 40 mg by mouth daily. 07/30/19   [provider]  hydrocortisone (CORTEF) 10 MG tablet Take 5-15 mg by mouth See admin instructions. Takes 15 mg in am and 5mg  in afternoon. 08/19/17   [provider]  levothyroxine (SYNTHROID, LEVOTHROID) 88 MCG tablet Take 88 mcg by mouth daily before breakfast.  05/15/17   [provider]  lidocaine-prilocaine (EMLA) cream Apply to affected area once 12/18/18   Ennever, Rudell Cobb, MD  loperamide (IMODIUM A-D) 2 MG tablet Take 1 tablet (2 mg total) by mouth 4 (four) times daily as needed. Take 2 at  diarrhea onset , then 1 every 2hr until 12hrs with no BM. May take 2 every 4hrs at night. If diarrhea recurs repeat. 12/18/18   [name], MD  LORazepam (ATIVAN) 1 MG tablet Take 1 tablet (1 mg total) by mouth every 6 (six) hours as needed (NAUSEA). 12/19/18   [name], MD  NASAL [facility] NA Place 1 spray into the nose daily.     [provider]  ondansetron (ZOFRAN) 8 MG tablet Take 1 tablet (8 mg total) by mouth 2 (two) times daily as needed for refractory nausea / vomiting. Start on day 3 after chemotherapy. 12/18/18   [name], MD  pantoprazole (PROTONIX) 40 MG tablet Take 40 mg by mouth every morning.  05/16/14   [provider]  polyvinyl alcohol (LIQUIFILM TEARS) 1.4 % ophthalmic solution Place 1 drop into both eyes daily as needed for dry eyes.     [provider]  potassium chloride (KLOR-CON 10) 10 MEQ tablet Take 20 mEq by mouth daily. 11/17/14   [provider]  prochlorperazine (COMPAZINE) 10 MG tablet Take 1 tablet (10 mg total) by mouth every 6 (six) hours as needed (NAUSEA). 12/18/18   [name], MD  testosterone (ANDRODERM) 4 MG/24HR PT24 patch Place 1 patch onto the skin daily.  04/17/15   [provider]  warfarin (COUMADIN) 5 MG tablet Take 5 mg by mouth daily. 07/05/17   [provider]    Allergies    Patient has no known allergies.  Review of Systems   Review of Systems  Constitutional:       Per HPI, otherwise negative  HENT:       Per HPI, otherwise negative  Respiratory:       Per HPI, otherwise negative  Cardiovascular:       Per HPI, otherwise negative  Gastrointestinal: Positive for nausea and vomiting. Negative for abdominal pain.  Endocrine:       Negative aside from HPI  Genitourinary:  Neg aside from HPI   Musculoskeletal:       Per HPI, otherwise negative  Skin: Positive for color change, pallor and wound.  Neurological: Positive for weakness. Negative for syncope.    Hematological:       Per  HPI - active CA    Physical Exam Updated Vital Signs There were no vitals taken for this visit.  Physical Exam Vitals and nursing note reviewed.  Constitutional:      General: He is in acute distress.     Appearance: He is well-developed. He is obese. He is ill-appearing and diaphoretic.  HENT:     Head: Normocephalic and atraumatic.  Eyes:     Conjunctiva/sclera: Conjunctivae normal.  Cardiovascular:     Rate and Rhythm: Regular rhythm. Tachycardia present.  Pulmonary:     Effort: Pulmonary effort is normal. Tachypnea present.     Breath sounds: Decreased air movement present.  Chest:    Abdominal:     General: There is no distension.     Tenderness: There is no abdominal tenderness. There is no guarding.  Skin:    General: Skin is warm.     Findings: Bruising and ecchymosis present.       Neurological:     Mental Status: He is oriented to person, place, and time.     Motor: Weakness and atrophy present.     Comments: listless  Psychiatric:        Behavior: Behavior is slowed and withdrawn.      ED Results / Procedures / Treatments   Labs (all labs ordered are listed, but only abnormal results are displayed) Labs Reviewed  CULTURE, BLOOD (ROUTINE X 2)  CULTURE, BLOOD (ROUTINE X 2)  URINE CULTURE  C DIFFICILE QUICK SCREEN W PCR REFLEX  LACTIC ACID, PLASMA  LACTIC ACID, PLASMA  COMPREHENSIVE METABOLIC PANEL  CBC WITH DIFFERENTIAL/PLATELET  PROTIME-INR  APTT  URINALYSIS, ROUTINE W REFLEX MICROSCOPIC  BRAIN NATRIURETIC PEPTIDE    EKG None  Radiology No results found.  Procedures Procedures (including critical care time)  Medications Ordered in ED Medications  lactated ringers infusion (has no administration in time range)  lactated ringers bolus 1,000 mL (has no administration in time range)  ceFEPIme (MAXIPIME) 2 g in sodium chloride 0.9 % 100 mL IVPB (has no administration in time range)  vancomycin (VANCOCIN)  IVPB 1000 mg/200 mL premix (has no administration in time range)  metroNIDAZOLE (FLAGYL) IVPB 500 mg (has no administration in time range)    ED Course  I have reviewed the triage vital signs and the nursing notes.  Pertinent labs & imaging results that were available during my care of the patient were reviewed by me and considered in my medical decision making (see chart for details).     With concern for nausea, vomiting, fever, hypotension, tachypnea, tachycardia, patient was designated as a code sepsis. With the patient's habitus, some suspicion for fluid overload status, fluids will be provided initially, pending additional lab studies, x-ray evaluation.  Patient started on broad-spectrum antibiotics soon after arrival, port required access by nursing staff.  1:05 PM Patient has received initial fluid resuscitation, but remains hypotensive.  Lactic 1.8. Additional initial labs notable for INR greater than 8, leukopenia. On repeat exam the patient continues to complain of pain, CT scan is pending. He is now accompanied by his wife.  We discussed his presentation, she notes that he has had several prior bouts of sepsis, similar presentations. She confirms his recent chemotherapy schedule  as last in office chemotherapy 8 days ago, followed by 2 days of home IV infusion ending 6 days ago.  2:47 PM Patient has received 3.5 L fluid resuscitation, here and with EMS.  He remains hypotensive. He is, however, speaking more clearly, seemingly more alert.  Additional labs available, negative coronavirus, no lactic acidosis, both reassuring. Patient has not yet had CT scan. Given his presentation concerning for sepsis with fever, hypotension, now without substantial blood pressure change in spite of appropriate resuscitation, patient will start Levophed. I have discussed his case with our critical care colleagues for admission.   MDM Rules/Calculators/A&P MDM Number of Diagnoses or Management  Options Severe sepsis ([place]): new, needed workup   Amount and/or Complexity of Data Reviewed Clinical lab tests: reviewed Tests in the radiology section of CPT: reviewed Tests in the medicine section of CPT: reviewed Decide to obtain previous medical records or to obtain history from someone other than the patient: yes Obtain history from someone other than the patient: yes Review and summarize past medical records: yes Discuss the patient with other providers: yes Independent visualization of images, tracings, or specimens: yes  Risk of Complications, Morbidity, and/or Mortality Presenting problems: high Diagnostic procedures: high Management options: high  Critical Care Total time providing critical care: 30-74 minutes (40)  Patient Progress Patient progress: stable  Final Clinical Impression(s) / ED Diagnoses Final diagnoses:  Severe sepsis [facility])      Carmin Muskrat, MD 09/11/19 1616

## 2019-09-12 ENCOUNTER — Encounter (HOSPITAL_COMMUNITY): Payer: Self-pay | Admitting: Pulmonary Disease

## 2019-09-12 ENCOUNTER — Inpatient Hospital Stay (HOSPITAL_COMMUNITY): Payer: Medicare HMO

## 2019-09-12 DIAGNOSIS — I4819 Other persistent atrial fibrillation: Secondary | ICD-10-CM

## 2019-09-12 DIAGNOSIS — I959 Hypotension, unspecified: Secondary | ICD-10-CM

## 2019-09-12 DIAGNOSIS — I2510 Atherosclerotic heart disease of native coronary artery without angina pectoris: Secondary | ICD-10-CM

## 2019-09-12 DIAGNOSIS — Z7901 Long term (current) use of anticoagulants: Secondary | ICD-10-CM

## 2019-09-12 DIAGNOSIS — C787 Secondary malignant neoplasm of liver and intrahepatic bile duct: Secondary | ICD-10-CM

## 2019-09-12 DIAGNOSIS — D649 Anemia, unspecified: Secondary | ICD-10-CM

## 2019-09-12 DIAGNOSIS — A0472 Enterocolitis due to Clostridium difficile, not specified as recurrent: Secondary | ICD-10-CM

## 2019-09-12 DIAGNOSIS — E2740 Unspecified adrenocortical insufficiency: Secondary | ICD-10-CM | POA: Diagnosis present

## 2019-09-12 DIAGNOSIS — D352 Benign neoplasm of pituitary gland: Secondary | ICD-10-CM

## 2019-09-12 DIAGNOSIS — R579 Shock, unspecified: Secondary | ICD-10-CM

## 2019-09-12 DIAGNOSIS — I255 Ischemic cardiomyopathy: Secondary | ICD-10-CM

## 2019-09-12 DIAGNOSIS — A419 Sepsis, unspecified organism: Secondary | ICD-10-CM

## 2019-09-12 DIAGNOSIS — R197 Diarrhea, unspecified: Secondary | ICD-10-CM

## 2019-09-12 DIAGNOSIS — N179 Acute kidney failure, unspecified: Secondary | ICD-10-CM | POA: Diagnosis not present

## 2019-09-12 DIAGNOSIS — I4891 Unspecified atrial fibrillation: Secondary | ICD-10-CM

## 2019-09-12 DIAGNOSIS — C189 Malignant neoplasm of colon, unspecified: Secondary | ICD-10-CM

## 2019-09-12 DIAGNOSIS — E039 Hypothyroidism, unspecified: Secondary | ICD-10-CM

## 2019-09-12 DIAGNOSIS — N182 Chronic kidney disease, stage 2 (mild): Secondary | ICD-10-CM

## 2019-09-12 DIAGNOSIS — D696 Thrombocytopenia, unspecified: Secondary | ICD-10-CM

## 2019-09-12 DIAGNOSIS — R6521 Severe sepsis with septic shock: Secondary | ICD-10-CM

## 2019-09-12 DIAGNOSIS — R571 Hypovolemic shock: Secondary | ICD-10-CM

## 2019-09-12 DIAGNOSIS — R6520 Severe sepsis without septic shock: Secondary | ICD-10-CM

## 2019-09-12 LAB — GLUCOSE, CAPILLARY
Glucose-Capillary: 186 mg/dL — ABNORMAL HIGH (ref 70–99)
Glucose-Capillary: 201 mg/dL — ABNORMAL HIGH (ref 70–99)
Glucose-Capillary: 228 mg/dL — ABNORMAL HIGH (ref 70–99)
Glucose-Capillary: 195 mg/dL — ABNORMAL HIGH (ref 70–99)

## 2019-09-12 LAB — COMPREHENSIVE METABOLIC PANEL
ALT: 32 U/L (ref 0–44)
AST: 50 U/L — ABNORMAL HIGH (ref 15–41)
Albumin: 2.5 g/dL — ABNORMAL LOW (ref 3.5–5.0)
Alkaline Phosphatase: 148 U/L — ABNORMAL HIGH (ref 38–126)
Anion gap: 11 (ref 5–15)
BUN: 32 mg/dL — ABNORMAL HIGH (ref 8–23)
CO2: 19 mmol/L — ABNORMAL LOW (ref 22–32)
Calcium: 7.4 mg/dL — ABNORMAL LOW (ref 8.9–10.3)
Chloride: 106 mmol/L (ref 98–111)
Creatinine, Ser: 1.67 mg/dL — ABNORMAL HIGH (ref 0.61–1.24)
GFR calc Af Amer: 47 mL/min — ABNORMAL LOW (ref >60)
GFR calc non Af Amer: 41 mL/min — ABNORMAL LOW (ref >60)
Glucose, Bld: 174 mg/dL — ABNORMAL HIGH (ref 70–99)
Potassium: 3.5 mmol/L (ref 3.5–5.1)
Sodium: 136 mmol/L (ref 135–145)
Total Bilirubin: 2.5 mg/dL — ABNORMAL HIGH (ref 0.3–1.2)
Total Protein: 4.7 g/dL — ABNORMAL LOW (ref 6.5–8.1)

## 2019-09-12 LAB — CBC WITH DIFFERENTIAL/PLATELET
Abs Immature Granulocytes: 0.06 10*3/uL (ref 0.00–0.07)
Basophils Absolute: 0.1 10*3/uL (ref 0.0–0.1)
Basophils Relative: 1 %
Eosinophils Absolute: 0 10*3/uL (ref 0.0–0.5)
Eosinophils Relative: 0 %
HCT: 32.9 % — ABNORMAL LOW (ref 39.0–52.0)
Hemoglobin: 10.3 g/dL — ABNORMAL LOW (ref 13.0–17.0)
Immature Granulocytes: 1 %
Lymphocytes Relative: 33 %
Lymphs Abs: 2 10*3/uL (ref 0.7–4.0)
MCH: 30.5 pg (ref 26.0–34.0)
MCHC: 31.3 g/dL (ref 30.0–36.0)
MCV: 97.3 fL (ref 80.0–100.0)
Monocytes Absolute: 2.1 10*3/uL — ABNORMAL HIGH (ref 0.1–1.0)
Monocytes Relative: 34 %
Neutro Abs: 1.9 10*3/uL (ref 1.7–7.7)
Neutrophils Relative %: 31 %
Platelets: 129 10*3/uL — ABNORMAL LOW (ref 150–400)
RBC: 3.38 MIL/uL — ABNORMAL LOW (ref 4.22–5.81)
RDW: 17.2 % — ABNORMAL HIGH (ref 11.5–15.5)
WBC: 6.1 10*3/uL (ref 4.0–10.5)
nRBC: 9 % — ABNORMAL HIGH (ref 0.0–0.2)

## 2019-09-12 LAB — ECHOCARDIOGRAM COMPLETE
Height: 70 in
S' Lateral: 3.6 cm
Weight: 4035.3 oz

## 2019-09-12 LAB — TYPE AND SCREEN
ABO/RH(D): O POS
Antibody Screen: NEGATIVE

## 2019-09-12 LAB — HEMOGLOBIN A1C
Hgb A1c MFr Bld: 8.9 % — ABNORMAL HIGH (ref 4.8–5.6)
Mean Plasma Glucose: 208.73 mg/dL

## 2019-09-12 LAB — URINE CULTURE: Culture: NO GROWTH

## 2019-09-12 LAB — PROTIME-INR
INR: >10 (ref 0.8–1.2)
Prothrombin Time: >90 seconds — ABNORMAL HIGH (ref 11.4–15.2)

## 2019-09-12 LAB — MAGNESIUM: Magnesium: 1.8 mg/dL (ref 1.7–2.4)

## 2019-09-12 LAB — PHOSPHORUS: Phosphorus: 5.5 mg/dL — ABNORMAL HIGH (ref 2.5–4.6)

## 2019-09-12 MED ORDER — ADULT MULTIVITAMIN W/MINERALS CH
1.0000 | ORAL_TABLET | Freq: Every day | ORAL | Status: DC
  Administered 2019-09-12 – 2019-09-20 (×9): 1 via ORAL
  Filled 2019-09-12 (×9): qty 1

## 2019-09-12 MED ORDER — VITAMIN K1 10 MG/ML IJ SOLN
10.0000 mg | Freq: Once | INTRAVENOUS | Status: AC
  Administered 2019-09-12: 10 mg via INTRAVENOUS
  Filled 2019-09-12: qty 1

## 2019-09-12 MED ORDER — INSULIN ASPART 100 UNIT/ML ~~LOC~~ SOLN
0.0000 [IU] | Freq: Three times a day (TID) | SUBCUTANEOUS | Status: DC
  Administered 2019-09-12: 3 [IU] via SUBCUTANEOUS
  Administered 2019-09-12 – 2019-09-13 (×4): 5 [IU] via SUBCUTANEOUS
  Administered 2019-09-14 (×2): 3 [IU] via SUBCUTANEOUS
  Administered 2019-09-14: 5 [IU] via SUBCUTANEOUS
  Administered 2019-09-15: 3 [IU] via SUBCUTANEOUS
  Administered 2019-09-15 – 2019-09-16 (×4): 5 [IU] via SUBCUTANEOUS
  Administered 2019-09-16: 6 [IU] via SUBCUTANEOUS
  Administered 2019-09-17: 2 [IU] via SUBCUTANEOUS
  Administered 2019-09-17 (×2): 3 [IU] via SUBCUTANEOUS
  Administered 2019-09-18 (×2): 5 [IU] via SUBCUTANEOUS
  Administered 2019-09-18 – 2019-09-19 (×3): 3 [IU] via SUBCUTANEOUS
  Administered 2019-09-19 – 2019-09-20 (×3): 5 [IU] via SUBCUTANEOUS
  Administered 2019-09-20: 3 [IU] via SUBCUTANEOUS

## 2019-09-12 MED ORDER — SODIUM CHLORIDE 0.9% FLUSH
10.0000 mL | Freq: Two times a day (BID) | INTRAVENOUS | Status: DC
  Administered 2019-09-12 – 2019-09-20 (×8): 10 mL

## 2019-09-12 MED ORDER — INSULIN ASPART 100 UNIT/ML ~~LOC~~ SOLN
0.0000 [IU] | Freq: Every day | SUBCUTANEOUS | Status: DC
  Administered 2019-09-14: 3 [IU] via SUBCUTANEOUS
  Administered 2019-09-15 – 2019-09-19 (×3): 2 [IU] via SUBCUTANEOUS

## 2019-09-12 MED ORDER — SODIUM CHLORIDE 0.9% IV SOLUTION: Freq: Once | INTRAVENOUS | Status: AC

## 2019-09-12 MED ORDER — VITAMIN K1 10 MG/ML IJ SOLN
2.5000 mg | Freq: Once | INTRAVENOUS | Status: AC
  Administered 2019-09-12: 2.5 mg via INTRAVENOUS
  Filled 2019-09-12: qty 0.25

## 2019-09-12 MED ORDER — KATE FARMS STANDARD 1.4 PO LIQD
325.0000 mL | Freq: Two times a day (BID) | ORAL | Status: DC
  Administered 2019-09-12 – 2019-09-19 (×12): 325 mL via ORAL
  Filled 2019-09-12 (×20): qty 325

## 2019-09-12 MED ORDER — VANCOMYCIN HCL IN DEXTROSE 1-5 GM/200ML-% IV SOLN
1000.0000 mg | INTRAVENOUS | Status: DC
  Administered 2019-09-12: 1000 mg via INTRAVENOUS
  Filled 2019-09-12 (×2): qty 200

## 2019-09-12 MED ORDER — MAGIC MOUTHWASH W/LIDOCAINE
15.0000 mL | Freq: Three times a day (TID) | ORAL | Status: DC | PRN
  Administered 2019-09-12: 15 mL via ORAL
  Filled 2019-09-12 (×2): qty 15

## 2019-09-12 MED ORDER — EPINEPHRINE 1 MG/10ML IJ SOSY
PREFILLED_SYRINGE | INTRAMUSCULAR | Status: AC
  Filled 2019-09-12: qty 10

## 2019-09-12 MED ORDER — SODIUM CHLORIDE 0.9 % IV SOLN
2.0000 g | Freq: Two times a day (BID) | INTRAVENOUS | Status: DC
  Administered 2019-09-12 – 2019-09-13 (×2): 2 g via INTRAVENOUS
  Filled 2019-09-12: qty 2

## 2019-09-12 MED ORDER — LACTATED RINGERS IV BOLUS
500.0000 mL | Freq: Once | INTRAVENOUS | Status: AC
  Administered 2019-09-12: 500 mL via INTRAVENOUS

## 2019-09-12 NOTE — Progress Notes (Signed)
CRITICAL VALUE ALERT  Critical Value:  INR >10  Date & Time Notied:  9/9 0500  Provider Notified: e-link  Orders Received/Actions taken: vitamin K

## 2019-09-12 NOTE — Consult Note (Addendum)
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  The patient has been receiving chemotherapy for his metastatic colon cancer.  He is receiving FOLFIRI.  Last dose was given on 09/03/2019 and he received Neulasta on 09/05/2019.  The patient was brought to the emergency room due to persistent diarrhea since receiving his last chemotherapy as well as a low blood pressure which was measured by his wife at home.  He also had a fever at home.  In the emergency room, he was hypotensive and received a 3 L IV fluid bolus, but had persistent hypotension.  He has been started on pressors.  INR was greater than 8, potassium 3.0, WBC 1.4, hemoglobin 10.4, platelets 74,000.  C. difficile screen was indeterminate, PCR positive.  CT of the abdomen pelvis showed findings consistent with enteritis.  He has been started on IV antibiotics and has received vitamin K for his elevated INR.  When seen today, the patient remains on pressors.  He reports that he feels somewhat better today.  He still has a lot of fatigue.  He denies any diarrhea so far this morning.  He is not having any fevers or chills.  Denies headaches and dizziness.  Denies chest pain, shortness of breath, cough.  He denies abdominal pain, nausea, vomiting.  Denies bleeding.  Labs from this morning have been reviewed and his WBC has now normalized at 6.1.  His hemoglobin is 10.3 and platelets have improved to 129,000.  BUN and creatinine are 32 and 1.67.  His AST is mildly elevated at 50 and T bili is 2.5.  INR this morning is greater than 10 and the patient is due to receive 2 units  FFP.  Medical oncology was asked see the patient for recommendations regarding his metastatic colon cancer.   Past Medical History:  Diagnosis Date  . Anticoagulated on Coumadin    chronic long term  . Antineoplastic chemotherapy induced anemia   . Chronic atrial fibrillation [facility])    cardiologist--  dr Curly Rim [facility] in [place])--- hx DVVC in 2006/  TEE 10-21-2015 (care everywhere)  ef 55-60%, moderate dilated RA, mild MR, RVSP 55mmHg  . CKD (chronic kidney disease), stage II   . Colon cancer metastasized to liver [facility])    current oncologist-- dr Marin Olp (previous oncologist at [facility] oncology)--- dx 12/ 2017,  Stage IIb ([id number]),  s/p  resection colon tumor 01-01-2016 ,  recurrent w/ liver mets via bx 08/ 2018  , had chemo then partial hepatectomy and completed chemo after surgery/ liver mets recurrence 08/ 2019  . Cushing's disease [facility])    endocriniologist-- dr Elisabeth Most [facility])  . Droopy eyelid, right    11-15-2017  . GERD (gastroesophageal reflux disease)   . Goals of care, counseling/discussion 08/30/2017  . Gout    11-15-2017 per pt last flare-up 2018  . History of benign pituitary tumor    resection 07-25-2013  . History of cancer chemotherapy    COMPLETED 02-20-2017  FOR  COLON CANCER WITH LIVER METS  . History of radiation therapy    03/ 2016  remainder of pituitary tumor  . Hypertension   . Hypertensive cardiovascular disease   .  Hypothyroidism, secondary   . OA (osteoarthritis)   . Renal insufficiency   . Secondary adrenal insufficiency ([place])   . Secondary male hypogonadism   . Wears glasses   . Wears partial dentures    upper and lower  :  Past Surgical History:  Procedure Laterality Date  . COLOSTOMY TAKEDOWN  05-16-2016   @[facility]   . IR 3D INDEPENDENT WKST  07/17/2018  . IR ANGIOGRAM SELECTIVE EACH ADDITIONAL VESSEL  07/17/2018  . IR ANGIOGRAM SELECTIVE EACH ADDITIONAL VESSEL  07/17/2018  . IR ANGIOGRAM SELECTIVE EACH ADDITIONAL VESSEL  07/17/2018   . IR ANGIOGRAM SELECTIVE EACH ADDITIONAL VESSEL  07/17/2018  . IR ANGIOGRAM SELECTIVE EACH ADDITIONAL VESSEL  07/17/2018  . IR ANGIOGRAM SELECTIVE EACH ADDITIONAL VESSEL  07/30/2018  . IR ANGIOGRAM SELECTIVE EACH ADDITIONAL VESSEL  07/30/2018  . IR ANGIOGRAM SELECTIVE EACH ADDITIONAL VESSEL  07/30/2018  . IR ANGIOGRAM SELECTIVE EACH ADDITIONAL VESSEL  07/30/2018  . IR ANGIOGRAM SELECTIVE EACH ADDITIONAL VESSEL  07/30/2018  . IR ANGIOGRAM SELECTIVE EACH ADDITIONAL VESSEL  08/27/2018  . IR ANGIOGRAM SELECTIVE EACH ADDITIONAL VESSEL  08/27/2018  . IR ANGIOGRAM VISCERAL SELECTIVE  07/17/2018  . IR ANGIOGRAM VISCERAL SELECTIVE  07/17/2018  . IR ANGIOGRAM VISCERAL SELECTIVE  08/27/2018  . IR EMBO ARTERIAL NOT HEMORR HEMANG INC GUIDE ROADMAPPING  07/17/2018  . IR EMBO TUMOR ORGAN ISCHEMIA INFARCT INC GUIDE ROADMAPPING  07/30/2018  . IR EMBO TUMOR ORGAN ISCHEMIA INFARCT INC GUIDE ROADMAPPING  08/27/2018  . IR RADIOLOGIST EVAL & MGMT  10/10/2017  . IR RADIOLOGIST EVAL & MGMT  11/07/2017  . IR RADIOLOGIST EVAL & MGMT  12/21/2017  . IR RADIOLOGIST EVAL & MGMT  06/19/2018  . IR RADIOLOGIST EVAL & MGMT  09/19/2018  . IR RADIOLOGIST EVAL & MGMT  12/18/2018  . IR US GUIDE VASC ACCESS RIGHT  07/17/2018  . IR US GUIDE VASC ACCESS RIGHT  07/30/2018  . IR US GUIDE VASC ACCESS RIGHT  08/27/2018  . OPEN PARTIAL HEPATECTOMY   01-13-2017    @[facility]    AND CHOLECYSTECTOMY  . RADIOFREQUENCY ABLATION N/A 11/22/2017   Procedure: CT MICROWAVE ABLATION LIVER;  Surgeon: Sandi Mariscal, MD;  Location: [facility];  Service: Anesthesiology;  Laterality: N/A;  . REVISION TOTAL KNEE ARTHROPLASTY Left 12/20/2013  . SUBTOTAL COLECTOMY  01-01-2016  @[facility]    W/  CREATION ILEOSTOMY AND UMBILICAL HERNIA REPAIR  . TOTAL KNEE ARTHROPLASTY Bilateral left 11-16-2010;  right 10-26-2011  . TRANSPHENOIDAL PITUITARY RESECTION  07-25-2013   @[facility]  :  Current Facility-Administered Medications  Medication Dose Route Frequency Provider Last Rate Last Admin   . 0.9 %  sodium chloride infusion (Manually program via Guardrails IV Fluids)   Intravenous Once Erick Colace, NP      . acyclovir (ZOVIRAX) tablet 400 mg  400 mg Oral BID Hunsucker, Bonna Gains, MD   400 mg at 09/12/19 1054  . allopurinol (ZYLOPRIM) tablet 300 mg  300 mg Oral q morning - 10a Hunsucker, Bonna Gains, MD   300 mg at 09/12/19 1043  . aspirin chewable tablet 81 mg  81 mg Oral Daily Hunsucker, Bonna Gains, MD   81 mg at 09/12/19 1043  . atorvastatin (LIPITOR) tablet 40 mg  40 mg Oral Daily Hunsucker, Bonna Gains, MD   40 mg at 09/12/19 1044  . ceFEPIme (MAXIPIME) 2 g in sodium chloride 0.9 % 100 mL IVPB  2 g Intravenous Q12H Hunsucker, Bonna Gains, MD      . Chlorhexidine Gluconate Cloth 2 %  PADS 6 each  6 each Topical Daily Hunsucker, Bonna Gains, MD   6 each at 09/12/19 1044  . docusate sodium (COLACE) capsule 100 mg  100 mg Oral BID PRN Hunsucker, Bonna Gains, MD      . feeding supplement (KATE FARMS STANDARD 1.4) liquid 325 mL  325 mL Oral BID BM Hunsucker, Bonna Gains, MD   325 mL at 09/12/19 1231  . hydrocortisone sodium succinate (SOLU-CORTEF) 100 MG injection 50 mg  50 mg Intravenous Q6H Hunsucker, Bonna Gains, MD   50 mg at 09/12/19 1229  . insulin aspart (novoLOG) injection 0-15 Units  0-15 Units Subcutaneous TID WC Hunsucker, Bonna Gains, MD   3 Units at 09/12/19 1229  . insulin aspart (novoLOG) injection 0-5 Units  0-5 Units Subcutaneous QHS Hunsucker, Bonna Gains, MD      . levothyroxine (SYNTHROID) tablet 88 mcg  88 mcg Oral QAC breakfast Hunsucker, Bonna Gains, MD   88 mcg at 09/12/19 0500  . magic mouthwash w/lidocaine  15 mL Oral TID PRN Hunsucker, Bonna Gains, MD      . MEDLINE mouth rinse  15 mL Mouth Rinse BID Hunsucker, Bonna Gains, MD   15 mL at 09/12/19 1044  . morphine 4 MG/ML injection 4 mg  4 mg Intravenous Q4H PRN Hunsucker, Bonna Gains, MD      . multivitamin with minerals tablet 1 tablet  1 tablet Oral Daily Hunsucker, Bonna Gains, MD   1 tablet at 09/12/19 1237  . norepinephrine  (LEVOPHED) 16 mg in 256mL premix infusion  0-40 mcg/min Intravenous Continuous Hunsucker, Bonna Gains, MD 8.44 mL/hr at 09/12/19 1200 9 mcg/min at 09/12/19 1200  . ondansetron (ZOFRAN) injection 4 mg  4 mg Intravenous Q6H PRN Hunsucker, Bonna Gains, MD      . pantoprazole (PROTONIX) EC tablet 40 mg  40 mg Oral q morning - 10a Hunsucker, Bonna Gains, MD   40 mg at 09/12/19 1044  . polyethylene glycol (MIRALAX / GLYCOLAX) packet 17 g  17 g Oral Daily PRN Hunsucker, Bonna Gains, MD      . sodium chloride flush (NS) 0.9 % injection 10-40 mL  10-40 mL Intracatheter Q12H Hunsucker, Bonna Gains, MD   10 mL at 09/12/19 1045  . vancomycin (VANCOCIN) 50 mg/mL oral solution 125 mg  125 mg Oral QID Hunsucker, Bonna Gains, MD   125 mg at 09/12/19 1053  . vancomycin (VANCOCIN) IVPB 1000 mg/200 mL premix  1,000 mg Intravenous Q24H Hunsucker, Bonna Gains, MD 200 mL/hr at 09/12/19 1231 1,000 mg at 09/12/19 1231  . vasopressin (PITRESSIN) 20 Units in sodium chloride 0.9 % 100 mL infusion-*FOR SHOCK*  0.03 Units/min Intravenous Continuous Hunsucker, Bonna Gains, MD   Stopped at 09/12/19 1047   Facility-Administered Medications Ordered in Other Encounters  Medication Dose Route Frequency Provider Last Rate Last Admin  . sodium chloride flush (NS) 0.9 % injection 10 mL  10 mL Intravenous PRN [place], Holli Humbles, NP   10 mL at 07/17/19 0949     No Known Allergies:  History reviewed. No pertinent family history.:  Social History   Socioeconomic History  . Marital status: Married    Spouse name: Not on file  . Number of children: Not on file  . Years of education: Not on file  . Highest education level: Not on file  Occupational History  . Not on file  Tobacco Use  . Smoking status: Never Smoker  . Smokeless tobacco: Never Used  Vaping Use  . Vaping Use: Never  used  Substance and Sexual Activity  . Alcohol use: Not Currently  . Drug use: Never  . Sexual activity: Not on file  Other Topics Concern  . Not on file   Social History Narrative  . Not on file   Social Determinants of Health   Financial Resource Strain:   . Difficulty of Paying Living Expenses: Not on file  Food Insecurity:   . Worried About Charity fundraiser in the Last Year: Not on file  . Ran Out of Food in the Last Year: Not on file  Transportation Needs:   . Lack of Transportation (Medical): Not on file  . Lack of Transportation (Non-Medical): Not on file  Physical Activity:   . Days of Exercise per Week: Not on file  . Minutes of Exercise per Session: Not on file  Stress:   . Feeling of Stress : Not on file  Social Connections:   . Frequency of Communication with Friends and Family: Not on file  . Frequency of Social Gatherings with Friends and Family: Not on file  . Attends Religious Services: Not on file  . Active Member of Clubs or Organizations: Not on file  . Attends Archivist Meetings: Not on file  . Marital Status: Not on file  Intimate Partner Violence:   . Fear of Current or Ex-Partner: Not on file  . Emotionally Abused: Not on file  . Physically Abused: Not on file  . Sexually Abused: Not on file  :  Review of Systems: A comprehensive 14 point review of systems was negative except as noted in the HPI.  Exam: Patient Vitals for the past 24 hrs:  BP Temp Temp src Pulse Resp SpO2 Height Weight  09/12/19 1200 94/61 -- -- 91 17 97 % -- --  09/12/19 1151 -- (!) 97.4 F (36.3 C) Oral -- -- -- -- --  09/12/19 1100 107/71 -- -- 97 16 98 % -- --  09/12/19 1000 119/69 -- -- 91 16 98 % -- --  09/12/19 0900 98/62 -- -- 98 15 98 % -- --  09/12/19 0810 -- 98.1 F (36.7 C) Axillary -- -- -- -- --  09/12/19 0800 103/62 -- -- 100 15 98 % -- --  09/12/19 0700 115/69 -- -- (!) 113 12 98 % -- --  09/12/19 0615 104/72 -- -- 100 13 98 % -- --  09/12/19 0600 118/70 -- -- (!) 109 15 99 % -- --  09/12/19 0545 105/69 -- -- 93 19 97 % -- --  09/12/19 0530 100/65 -- -- (!) 104 14 99 % -- --  09/12/19 0515 107/76  -- -- (!) 111 16 99 % -- --  09/12/19 0500 121/77 98 F (36.7 C) Oral 94 13 98 % -- --  09/12/19 0445 111/66 -- -- (!) 111 14 99 % -- --  09/12/19 0430 130/74 -- -- (!) 108 10 99 % -- --  09/12/19 0415 135/76 -- -- (!) 107 17 97 % -- --  09/12/19 0400 126/82 -- -- 96 16 99 % -- --  09/12/19 0345 131/73 -- -- (!) 101 16 98 % -- --  09/12/19 0330 131/73 -- -- 98 15 98 % -- --  09/12/19 0318 -- -- -- -- -- -- -- 114.4 kg  09/12/19 0300 121/68 -- -- (!) 115 (!) 0 98 % -- --  09/12/19 0245 (!) 141/85 -- -- (!) 114 (!) 0 99 % -- --  09/12/19 0230 134/80 -- -- Marland Kitchen  106 14 99 % -- --  09/12/19 0215 136/87 -- -- (!) 116 (!) 21 98 % -- --  09/12/19 0200 (!) 139/92 -- -- (!) 116 (!) 21 97 % -- --  09/12/19 0145 (!) 98/55 -- -- 97 16 98 % -- --  09/12/19 0130 111/64 -- -- (!) 111 -- 98 % -- --  09/12/19 0115 103/72 -- -- (!) 114 15 99 % -- --  09/12/19 0100 100/64 -- -- (!) 109 15 98 % -- --  09/12/19 0045 -- -- -- 99 (!) 22 94 % -- --  09/12/19 0030 120/67 -- -- (!) 107 16 99 % -- --  09/12/19 0015 115/72 -- -- (!) 111 19 99 % -- --  09/12/19 0000 115/84 98.8 F (37.1 C) Oral (!) 107 20 98 % -- --  09/11/19 2345 (!) 138/93 -- -- (!) 110 (!) 21 97 % -- --  09/11/19 2330 -- -- -- 94 (!) 21 96 % -- --  09/11/19 2315 105/65 -- -- (!) 121 19 98 % -- --  09/11/19 2300 116/77 -- -- (!) 101 18 96 % -- --  09/11/19 2245 -- -- -- 95 (!) 21 90 % -- --  09/11/19 2230 104/68 -- -- (!) 121 14 97 % -- --  09/11/19 2215 106/61 -- -- (!) 120 -- 98 % -- --  09/11/19 2200 122/77 -- -- (!) 135 -- 98 % -- --  09/11/19 2145 115/69 -- -- (!) 106 20 98 % -- --  09/11/19 2131 97/73 99.6 F (37.6 C) Oral -- -- -- -- --  09/11/19 2130 93/73 -- -- (!) 108 (!) 22 96 % -- --  09/11/19 2115 107/73 -- -- (!) 114 14 98 % -- --  09/11/19 2100 112/66 -- -- (!) 114 -- 98 % -- --  09/11/19 2045 122/90 -- -- (!) 115 19 97 % -- --  09/11/19 2030 129/75 -- -- (!) 115 20 99 % -- --  09/11/19 2015 125/78 -- -- (!) 111 20 98 % --  --  09/11/19 2000 (!) 108/53 -- -- (!) 113 19 97 % -- --  09/11/19 1900 108/84 -- -- (!) 122 20 97 % -- --  09/11/19 1745 (!) 96/58 -- -- (!) 117 15 94 % -- --  09/11/19 1740 104/67 97.9 F (36.6 C) Oral -- -- -- 5\' 10"  (1.778 m) 114.4 kg  09/11/19 1735 97/73 -- -- -- -- -- -- --  09/11/19 1730 110/68 -- -- -- -- -- -- --  09/11/19 1705 (!) 89/55 -- -- -- -- -- -- --  09/11/19 1700 (!) 83/39 -- -- -- -- -- -- --  09/11/19 1655 (!) 75/40 -- -- -- -- -- -- --  09/11/19 1650 (!) 81/43 -- -- -- -- -- -- --  09/11/19 1645 98/69 -- -- -- -- -- -- --  09/11/19 1610 99/68 -- -- (!) 119 (!) 23 96 % -- --  09/11/19 1600 -- 98.1 F (36.7 C) Oral -- -- -- -- --  09/11/19 1541 105/82 -- -- -- 18 -- -- --  09/11/19 1535 93/69 -- -- (!) 124 18 95 % -- --  09/11/19 1533 103/61 -- -- -- 17 -- -- --  09/11/19 1520 (!) 89/79 -- -- -- 17 -- -- --  09/11/19 1516 (!) 88/48 -- -- -- 16 -- -- --  09/11/19 1515 (!) 60/36 -- -- (!) 124 18 95 % -- --  09/11/19 1513 -- -- -- Marland Kitchen  112 20 98 % -- --  09/11/19 1510 (!) 79/51 -- -- -- 12 -- -- --  09/11/19 1506 -- -- -- -- 16 -- -- --  09/11/19 1505 (!) 95/46 -- -- (!) 113 -- 99 % -- --  09/11/19 1500 (!) 88/56 -- -- (!) 113 (!) 29 94 % -- --  09/11/19 1455 91/60 -- -- 97 (!) 26 96 % -- --  09/11/19 1453 -- -- -- (!) 102 (!) 25 97 % -- --  09/11/19 1450 (!) 86/53 -- -- 97 (!) 26 94 % -- --  09/11/19 1445 (!) 82/56 -- -- (!) 119 17 95 % -- --  09/11/19 1440 (!) 92/47 -- -- (!) 115 20 98 % -- --  09/11/19 1437 -- -- -- 99 -- -- -- --  09/11/19 1436 (!) 86/52 -- -- -- 14 96 % -- --  09/11/19 1430 (!) 69/46 -- -- (!) 105 13 94 % -- --  09/11/19 1415 (!) 69/34 -- -- (!) 106 19 99 % -- --  09/11/19 1345 (!) 71/50 -- -- 94 16 100 % -- --  09/11/19 1330 (!) 72/41 -- -- 96 (!) 21 99 % -- --  09/11/19 1329 (!) 65/46 -- -- (!) 102 15 97 % -- --    General: Chronically ill-appearing male, no distress Eyes:  no scleral icterus.   ENT: No thrush or mucositis, oral  mucosa dry.  Noted to have a right facial droop. Neck was without thyromegaly.   Lymphatics:  Negative cervical, supraclavicular or axillary adenopathy.   Respiratory: lungs were clear bilaterally without wheezing or crackles.   Cardiovascular: Regular rate, tachycardic, diffuse anasarca noted GI:  abdomen was soft, flat, nontender, nondistended, without organomegaly.   Musculoskeletal:  no spinal tenderness of palpation of vertebral spine.   Skin: Multiple ecchymoses on his arms and legs. Neuro: Patient was alert and oriented.  Attention was good.   Language was appropriate.  Mood was normal without depression.  Speech was not pressured.  Thought content was not tangential.     Lab Results  Component Value Date   WBC 6.1 09/12/2019   HGB 10.3 (L) 09/12/2019   HCT 32.9 (L) 09/12/2019   PLT 129 (L) 09/12/2019   GLUCOSE 174 (H) 09/12/2019   ALT 32 09/12/2019   AST 50 (H) 09/12/2019   NA 136 09/12/2019   K 3.5 09/12/2019   CL 106 09/12/2019   CREATININE 1.67 (H) 09/12/2019   BUN 32 (H) 09/12/2019   CO2 19 (L) 09/12/2019    CT Abdomen Pelvis W Contrast  Result Date: 09/11/2019 CLINICAL DATA:  Nausea, vomiting, and diarrhea for 3 days. Fever. Diverticulitis suspected. History of colon cancer metastasized to liver. EXAM: CT ABDOMEN AND PELVIS WITH CONTRAST TECHNIQUE: Multidetector CT imaging of the abdomen and pelvis was performed using the standard protocol following bolus administration of intravenous contrast. CONTRAST:  167mL OMNIPAQUE IOHEXOL 300 MG/ML  SOLN COMPARISON:  CT 10 days ago 09/02/2019 FINDINGS: Lower chest: Trace pleural effusions and compressive atelectasis, new from recent CT. Right middle and lower lobe pulmonary nodules are stable in the short interim. Coronary artery calcifications. Upper normal heart size. Hepatobiliary: Postsurgical change in the liver. Nodular contours. Hypodense lesion in the subcapsular right lobe measures 3.9 x 3.0 cm, unchanged allowing for  differences in caliper placement. No new hepatic abnormality cholecystectomy without biliary dilatation. Pancreas: Fatty atrophy.  No ductal dilatation or inflammation. Spleen: Splenomegaly with maximal coronal dimension 13.8 cm on the current exam.  No focal abnormality. Adrenals/Urinary Tract: Normal adrenal glands without nodule. No hydronephrosis. Similar symmetric perinephric edema. Mild bilateral renal parenchymal thinning. Unchanged simple cyst in the mid left kidney. No visualized renal calculi. Again seen thickening of the urinary bladder which appears chronic and is likely related to chronic bladder outlet obstruction. Stomach/Bowel: Fluid within the stomach with chronic mild submucosal fatty deposition. No duodenal inflammation. Proximal small bowel are normal. Mid small bowel are fluid-filled but nondilated. There is a moderate length segment of small bowel wall thickening involving the jejunal bowel loops to the neoterminal ileum at the ileocolic junction. Loop of small bowel enters a upper abdominal hernia but no evidence of proximal obstruction. The degree of small bowel wall thickening of the herniated bowel segment is similar to that of the non herniated segments, and felt to be related to the underlying diffuse process rather than incarceration. Enteric sutures noted in the distal small bowel. The remaining in situ colon is fluid-filled without wall thickening. There is mild diffuse mesenteric edema. No focal diverticulitis. No bowel pneumatosis. Vascular/Lymphatic: Aortic atherosclerosis. No aortic aneurysm. The portal vein is patent. Prior left hepatic artery embolization. There are no acute vascular findings. No bulky abdominopelvic adenopathy. Reproductive: Choose. Right seminal vesicle is hypoplastic or atrophic. Other: There is mild generalized mesenteric edema with trace free fluid in the pericolic gutters, mesentery, and pelvis. No free air or organized collection. Upper abdominal hernia  contains short segment small bowel. There are multiple additional small fat containing supraumbilical and umbilical ventral abdominal wall hernias. Musculoskeletal: No acute osseous abnormalities or change from prior exam. Degenerative change in the spine. IMPRESSION: 1. Moderate length segment of small bowel wall thickening involving the jejunal bowel loops to the neoterminal ileum at the ileocolic junction. Findings most consistent with enteritis. The remainder of the bowel is fluid-filled but noninflamed, in keeping with diarrheal process. 2. Upper abdominal ventral abdominal wall hernia contains short segment small bowel, but no proximal obstruction. Multiple additional small fat containing supraumbilical and umbilical ventral abdominal wall hernias, stable. 3. Grossly unchanged hypodense lesion in the subcapsular right lobe of the liver. 4. Chronic urinary bladder thickening, likely related to chronic bladder outlet obstruction. 5. Trace pleural effusions and compressive atelectasis, new from recent CT. Aortic Atherosclerosis (ICD10-I70.0). Electronically Signed   By: Keith Rake M.D.   On: 09/11/2019 16:27   CT CHEST ABDOMEN PELVIS W CONTRAST  Result Date: 09/02/2019 CLINICAL DATA:  Metastatic colon cancer restaging EXAM: CT CHEST, ABDOMEN, AND PELVIS WITH CONTRAST TECHNIQUE: Multidetector CT imaging of the chest, abdomen and pelvis was performed following the standard protocol during bolus administration of intravenous contrast. CONTRAST:  165mL OMNIPAQUE IOHEXOL 300 MG/ML SOLN, additional oral enteric contrast COMPARISON:  05/28/2019 FINDINGS: CT CHEST FINDINGS Cardiovascular: Right chest port catheter. Aortic atherosclerosis. Normal heart size. Extensive 3 vessel coronary artery calcification and/or stents. No pericardial effusion. Mediastinum/Nodes: No enlarged mediastinal, hilar, or axillary lymph nodes. Redemonstrated enlarged, heterogeneous left lobe of the thyroid. In the setting of  significant comorbidities or limited life expectancy, no follow-up recommended (ref: J Am Coll Radiol. 2015 Feb;12(2): 143-50). Trachea, and esophagus demonstrate no significant findings. Lungs/Pleura: Diffuse bilateral bronchial wall thickening. There are numerous small bilateral pulmonary nodules, the majority of which demonstrate no significant interval change, the largest in the medial right upper lobe measuring 9 mm (series 4, image 54). Some nodules do however appear to have minimally increased in size for example a 6 mm nodule of the lingula which previously measured 4-5 mm (series  4, image 96). No pleural effusion or pneumothorax. Musculoskeletal: Unchanged sclerotic focus of the lateral right eighth rib (series 2, image 50) CT ABDOMEN PELVIS FINDINGS Hepatobiliary: Redemonstrated cirrhotic morphology of the liver. Unchanged ill-defined hypodensity of the peripheral right lobe of the liver, hepatic segment VI, measuring approximately 4.0 x 3.6 cm (series 2, image 59). Status post cholecystectomy. No biliary dilatation. Pancreas: Unremarkable. No pancreatic ductal dilatation or surrounding inflammatory changes. Spleen: Splenomegaly, maximum coronal span 16.6 cm. Adrenals/Urinary Tract: Adrenal glands are unremarkable. Kidneys are normal, without renal calculi, solid lesion, or hydronephrosis. Simple, exophytic cyst of the midportion of the left kidney. Thickening of the decompressed urinary bladder, likely secondary to chronic outlet obstruction. Stomach/Bowel: Stomach is within normal limits. Status post right hemicolectomy. Vascular/Lymphatic: No significant vascular findings are present. No enlarged abdominal or pelvic lymph nodes. Reproductive: No mass or other abnormality. Other: No abdominal wall hernia or abnormality. No abdominopelvic ascites. Musculoskeletal: No acute or significant osseous findings. IMPRESSION: 1. There are numerous small bilateral pulmonary nodules, the majority of which  demonstrate no significant interval change, the majority of which demonstrate no significant interval change. Some nodules do however appear to have minimally increased in size. Findings are consistent with stable to minimally worsened pulmonary metastatic disease. 2. Unchanged ill-defined hypodensity of the peripheral right lobe of the liver, consistent with treated metastasis. No new hepatic metastatic lesions appreciated. 3. Unchanged sclerotic focus of the lateral right eighth rib, nonspecific and indeterminate for osseous metastatic disease. Attention on follow-up. 4. Redemonstrated cirrhotic morphology of the liver and splenomegaly. 5. Coronary artery disease.  Aortic Atherosclerosis (ICD10-I70.0). Electronically Signed   By: Eddie Candle M.D.   On: 09/02/2019 12:16   DG Chest Port 1 View  Result Date: 09/11/2019 CLINICAL DATA:  Questionable sepsis. EXAM: PORTABLE CHEST 1 VIEW COMPARISON:  June 11, 2019 FINDINGS: RIGHT-sided Port-A-Cath terminates at the caval to atrial junction as before. Cardiomediastinal contours are stable. Changes of median sternotomy as before. Lungs are clear. No acute skeletal process to the extent evaluated. No sign of effusion on frontal view. RIGHT humeral intraosseous access. IMPRESSION: 1. No active cardiopulmonary disease. 2. Port-A-Cath and suspected RIGHT humeral intraosseous access. Electronically Signed   By: Zetta Bills M.D.   On: 09/11/2019 11:39     CT Abdomen Pelvis W Contrast  Result Date: 09/11/2019 CLINICAL DATA:  Nausea, vomiting, and diarrhea for 3 days. Fever. Diverticulitis suspected. History of colon cancer metastasized to liver. EXAM: CT ABDOMEN AND PELVIS WITH CONTRAST TECHNIQUE: Multidetector CT imaging of the abdomen and pelvis was performed using the standard protocol following bolus administration of intravenous contrast. CONTRAST:  160mL OMNIPAQUE IOHEXOL 300 MG/ML  SOLN COMPARISON:  CT 10 days ago 09/02/2019 FINDINGS: Lower chest: Trace pleural  effusions and compressive atelectasis, new from recent CT. Right middle and lower lobe pulmonary nodules are stable in the short interim. Coronary artery calcifications. Upper normal heart size. Hepatobiliary: Postsurgical change in the liver. Nodular contours. Hypodense lesion in the subcapsular right lobe measures 3.9 x 3.0 cm, unchanged allowing for differences in caliper placement. No new hepatic abnormality cholecystectomy without biliary dilatation. Pancreas: Fatty atrophy.  No ductal dilatation or inflammation. Spleen: Splenomegaly with maximal coronal dimension 13.8 cm on the current exam. No focal abnormality. Adrenals/Urinary Tract: Normal adrenal glands without nodule. No hydronephrosis. Similar symmetric perinephric edema. Mild bilateral renal parenchymal thinning. Unchanged simple cyst in the mid left kidney. No visualized renal calculi. Again seen thickening of the urinary bladder which appears chronic and is likely related  to chronic bladder outlet obstruction. Stomach/Bowel: Fluid within the stomach with chronic mild submucosal fatty deposition. No duodenal inflammation. Proximal small bowel are normal. Mid small bowel are fluid-filled but nondilated. There is a moderate length segment of small bowel wall thickening involving the jejunal bowel loops to the neoterminal ileum at the ileocolic junction. Loop of small bowel enters a upper abdominal hernia but no evidence of proximal obstruction. The degree of small bowel wall thickening of the herniated bowel segment is similar to that of the non herniated segments, and felt to be related to the underlying diffuse process rather than incarceration. Enteric sutures noted in the distal small bowel. The remaining in situ colon is fluid-filled without wall thickening. There is mild diffuse mesenteric edema. No focal diverticulitis. No bowel pneumatosis. Vascular/Lymphatic: Aortic atherosclerosis. No aortic aneurysm. The portal vein is patent. Prior left  hepatic artery embolization. There are no acute vascular findings. No bulky abdominopelvic adenopathy. Reproductive: Choose. Right seminal vesicle is hypoplastic or atrophic. Other: There is mild generalized mesenteric edema with trace free fluid in the pericolic gutters, mesentery, and pelvis. No free air or organized collection. Upper abdominal hernia contains short segment small bowel. There are multiple additional small fat containing supraumbilical and umbilical ventral abdominal wall hernias. Musculoskeletal: No acute osseous abnormalities or change from prior exam. Degenerative change in the spine. IMPRESSION: 1. Moderate length segment of small bowel wall thickening involving the jejunal bowel loops to the neoterminal ileum at the ileocolic junction. Findings most consistent with enteritis. The remainder of the bowel is fluid-filled but noninflamed, in keeping with diarrheal process. 2. Upper abdominal ventral abdominal wall hernia contains short segment small bowel, but no proximal obstruction. Multiple additional small fat containing supraumbilical and umbilical ventral abdominal wall hernias, stable. 3. Grossly unchanged hypodense lesion in the subcapsular right lobe of the liver. 4. Chronic urinary bladder thickening, likely related to chronic bladder outlet obstruction. 5. Trace pleural effusions and compressive atelectasis, new from recent CT. Aortic Atherosclerosis (ICD10-I70.0). Electronically Signed   By: Keith Rake M.D.   On: 09/11/2019 16:27   CT CHEST ABDOMEN PELVIS W CONTRAST  Result Date: 09/02/2019 CLINICAL DATA:  Metastatic colon cancer restaging EXAM: CT CHEST, ABDOMEN, AND PELVIS WITH CONTRAST TECHNIQUE: Multidetector CT imaging of the chest, abdomen and pelvis was performed following the standard protocol during bolus administration of intravenous contrast. CONTRAST:  147mL OMNIPAQUE IOHEXOL 300 MG/ML SOLN, additional oral enteric contrast COMPARISON:  05/28/2019 FINDINGS: CT  CHEST FINDINGS Cardiovascular: Right chest port catheter. Aortic atherosclerosis. Normal heart size. Extensive 3 vessel coronary artery calcification and/or stents. No pericardial effusion. Mediastinum/Nodes: No enlarged mediastinal, hilar, or axillary lymph nodes. Redemonstrated enlarged, heterogeneous left lobe of the thyroid. In the setting of significant comorbidities or limited life expectancy, no follow-up recommended (ref: J Am Coll Radiol. 2015 Feb;12(2): 143-50). Trachea, and esophagus demonstrate no significant findings. Lungs/Pleura: Diffuse bilateral bronchial wall thickening. There are numerous small bilateral pulmonary nodules, the majority of which demonstrate no significant interval change, the largest in the medial right upper lobe measuring 9 mm (series 4, image 54). Some nodules do however appear to have minimally increased in size for example a 6 mm nodule of the lingula which previously measured 4-5 mm (series 4, image 96). No pleural effusion or pneumothorax. Musculoskeletal: Unchanged sclerotic focus of the lateral right eighth rib (series 2, image 50) CT ABDOMEN PELVIS FINDINGS Hepatobiliary: Redemonstrated cirrhotic morphology of the liver. Unchanged ill-defined hypodensity of the peripheral right lobe of the liver, hepatic segment VI,  measuring approximately 4.0 x 3.6 cm (series 2, image 59). Status post cholecystectomy. No biliary dilatation. Pancreas: Unremarkable. No pancreatic ductal dilatation or surrounding inflammatory changes. Spleen: Splenomegaly, maximum coronal span 16.6 cm. Adrenals/Urinary Tract: Adrenal glands are unremarkable. Kidneys are normal, without renal calculi, solid lesion, or hydronephrosis. Simple, exophytic cyst of the midportion of the left kidney. Thickening of the decompressed urinary bladder, likely secondary to chronic outlet obstruction. Stomach/Bowel: Stomach is within normal limits. Status post right hemicolectomy. Vascular/Lymphatic: No significant  vascular findings are present. No enlarged abdominal or pelvic lymph nodes. Reproductive: No mass or other abnormality. Other: No abdominal wall hernia or abnormality. No abdominopelvic ascites. Musculoskeletal: No acute or significant osseous findings. IMPRESSION: 1. There are numerous small bilateral pulmonary nodules, the majority of which demonstrate no significant interval change, the majority of which demonstrate no significant interval change. Some nodules do however appear to have minimally increased in size. Findings are consistent with stable to minimally worsened pulmonary metastatic disease. 2. Unchanged ill-defined hypodensity of the peripheral right lobe of the liver, consistent with treated metastasis. No new hepatic metastatic lesions appreciated. 3. Unchanged sclerotic focus of the lateral right eighth rib, nonspecific and indeterminate for osseous metastatic disease. Attention on follow-up. 4. Redemonstrated cirrhotic morphology of the liver and splenomegaly. 5. Coronary artery disease.  Aortic Atherosclerosis (ICD10-I70.0). Electronically Signed   By: Eddie Candle M.D.   On: 09/02/2019 12:16   DG Chest Port 1 View  Result Date: 09/11/2019 CLINICAL DATA:  Questionable sepsis. EXAM: PORTABLE CHEST 1 VIEW COMPARISON:  June 11, 2019 FINDINGS: RIGHT-sided Port-A-Cath terminates at the caval to atrial junction as before. Cardiomediastinal contours are stable. Changes of median sternotomy as before. Lungs are clear. No acute skeletal process to the extent evaluated. No sign of effusion on frontal view. RIGHT humeral intraosseous access. IMPRESSION: 1. No active cardiopulmonary disease. 2. Port-A-Cath and suspected RIGHT humeral intraosseous access. Electronically Signed   By: Zetta Bills M.D.   On: 09/11/2019 11:39   Assessment and Plan:  1.  Metastatic colon cancer 2.  Septic and hypovolemic shock 3.  C. Difficile 4.  Severe coagulopathy 5.  Neutropenia, resolved 6.  Anemia 7.   Thrombocytopenia, improved 8.  AKI  -Mr. Hanratty is feeling much better.  He had significant diarrhea following his last cycle of chemotherapy which can be a side effect Irinotecan.  However, was found to be C. difficile positive.  He is being treated with IV antibiotics and oral vancomycin.  He reports that he feels much better today without significant diarrhea. -Continue IV fluids and pressors per PCCM.  Pressors are being slowly weaned as blood pressure tolerates. -The patient's INR significantly elevated.  He is not actively bleeding.  Status post vitamin K without improvement.  He is scheduled to receive 2 units of FFP today.  Monitor INR closely. -Neutropenia has now resolved-he is status post G-CSF following chemotherapy.  Monitor. -Hemoglobin has remained stable.  Monitor and transfuse PRBCs for hemoglobin less than 7. -Platelet count was low likely due to recent chemotherapy.  Platelet count is slowly recovering.  Monitor.  Mikey Bussing, DNP, AGPCNP-BC, AOCNP   ADDENDUM: I saw and examined Mr. Cerasoli this morning.  He is well-known to me.  So far, he is doing well with treatment.  Clearly, he has had toxicity from the chemotherapy.  I suspect that this probably is the irinotecan that is the problem.  He is on PCR positive for C. difficile.  I guess the toxin is negative.  I am not sure exactly what this means.  The real problem right now is that he has a markedly elevated INR of over 10.  He is given FFP.  He has had no bleeding.  He really needs a much higher dose of vitamin K.  I would put him on 10 mg of vitamin K.  Thankfully, he is not neutropenic.  His white cell count yesterday was 1.4.  Today it is 6.1.  This is a blessing.  I hope that his blood cultures are negative.  I would like to think that they would be negative.  He did get Neulasta after chemotherapy last week.  I know he has a lot of other health issues.  He has the cardiomyopathy.  He has the adrenal insufficiency.   He does have a lot going on.  He he looked a little bit better than I would have thought.  I know he is on some pressor medicine.  Hopefully, this will be able to be tapered off over time.  I very much appreciate the outstanding care that he is getting from the staff in the ICU.  I know he is in the best of hands.  I appreciate the compassion and professionalism that the staff has.  Lattie Haw, MD  Darlyn Chamber 30:17

## 2019-09-12 NOTE — Progress Notes (Signed)
NAME:  Zachary Willis, MRN:  [id number], DOB:  Nov 13, 1945, LOS: 1 ADMISSION DATE:  09/11/2019, CONSULTATION DATE:  09/12/19 REFERRING MD:  ED - Dr. 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  History of present illness   Patient was in normal state of health.  Undergoing chemotherapy, 12 cycles currently for metastatic colon cancer.  He has had surgical resection in the past.  He often has diarrhea after his chemotherapy treatments.  However over the last couple of days the diarrhea has been much more severe, higher volume.  Yesterday his blood pressure was low but lower than normal when wife checked in the morning.  She attributed this to the diarrhea and dehydration.  However, this morning blood pressure continue to be low and he had a fever over 100 F.  He appeared similar to other times he has been admitted with infections to the hospital.  She called EMS and he was transported to [facility] long ED.  In the emergency room he was noted to be hypotensive.  He received about 3 L of IV fluid bolus with persistent hypotension.  Norepinephrine was started.  Blood culture obtained.  Urine culture obtained, UA clean.  Chest x-ray clear.  CT abdomen pelvis ordered.  Given diarrhea C. difficile was obtained with indeterminate results on screen, PCR pending.  Other notable labs, INR greater than 8 on warfarin.  Kidney function near baseline.  Hemoglobin stable at baseline, leukopenic with neutropenia 0.5.  Chronic thrombocytopenia stable.  Currently, norepinephrine dose increasing despite fluids.  He is warm on exam.  No lower  extremity edema.  Habitus precludes accurate JVP assessment.  Bedside ultrasound with very poor windows but what appears to be small and collapsible IVC, reasonably normal ejection fraction on parasternal long and short views.  He received vancomycin, cefepime, and Flagyl IV.  These medicines continue on admission.  Given indeterminate C. difficile screen, oral vancomycin ordered on admission.  Additionally, noted to have adrenal insufficiency in the setting of pituitary tumor resection on hydrocortisone at home.  Ordered and personally observed IV hydrocortisone administered in the ED room.  Lactic acid rising unsurprisingly in setting of escalating pressor doses.  Past Medical History  CAD, colon cancer, pituitary tumor status post resection, ischemic cardiomyopathy  Significant Hospital Events   9/8-presents to ED, hypotensive, pressure started, admitted to ICU 9/9: Systolic blood pressure stabilizing.  Heart rate better.  Still on pressors but requirements improved.  No nausea vomiting or diarrhea overnight.  Did sustain mild acute kidney injury, INR greater than 10.  Stopping Flagyl.  Giving FFP.  Allowing clear liquid diet. Consults:  N/A  Procedures:  IO placement in right humerus in ED   Significant Diagnostic Tests:  Test x-ray clear, UA clean, C. difficile screen indeterminate  Micro Data:  Urine culture 9/8: Negative Blood culture 9/8>>> C. difficile toxin PCR 9/8: Positive C. difficile quick screen PCR: Antigen positive, toxin negative Coronavirus 2 PCR: Negative  Antimicrobials:  Cefepime 9/8: Vancomycin 9/8: Oral vancomycin 9/8: IV Flagyl 9/8>>> 9/9  Interim history/subjective:  Feels better Objective   Blood pressure 115/69, pulse (Abnormal) 113, temperature 98.1  F (36.7 C), temperature source Axillary, resp. rate 12, height 5\' 10"  (1.778 m), weight 114.4 kg, SpO2 98 %.        Intake/Output Summary (Last 24 hours) at 09/12/2019 0904 Last data filed at 09/12/2019  0625 Gross per 24 hour  Intake 5512.74 ml  Output 500 ml  Net 5012.74 ml   Filed Weights   09/11/19 1032 09/11/19 1740 09/12/19 0318  Weight: 116 kg 114.4 kg 114.4 kg    Examination: General chronically ill white male, resting in bed and in no acute distress this morning HEENT mucous membranes remain quite dry.  Sclera nonicteric no JVD.  He has a chronic right facial droop his speech is slightly slurred.  He reports this is typical, usually worse when he is dehydrated, and has been a chronic issue since his pituitary resection Pulmonary: Clear to auscultation and without accessory use Cardiac: Regular, tachycardic rhythm without murmur rub or gallop Abdomen: Soft not tender no organomegaly has had no further nausea, no further diarrhea Extremities: Diffuse anasarca, scattered areas of ecchymosis, brisk capillary refill Neuro: Awake oriented only focal deficits are as mentioned above primarily with right facial droop and somewhat slurred speech GU clear yellow Resolved Hospital Problem list   N/A  Assessment & Plan:  Multifactorial shock-septic and hypovolemic in the setting of diarrhea exacerbated by adrenal insufficiency His C. Difficile toxin PCR was positive.  With lack of other source we will continue to treat as such I do think he is still volume depleted Plan Continue oral vancomycin Discontinue Flagyl given significant coagulopathy likely a consequence of still having Coumadin on board We will continue Vanco and cefepime for now, if all additional cultures remain negative will discontinue these as well Continue stress dose steroids Will transfuse 2 units of FFP, this will provide additional volume challenge and help correct his coagulopathy Continue to wean vasoactive drips, mean arterial pressure goal greater than 65 Holding diuretics and home antihypertensives  Febrile neutropenia: In the setting of recent chemotherapy.  Received Neulasta a few days ago.  Plan As  addressed above  Acute kidney injury with mild nonanion gap metabolic acidosis.  Also had anion gap acidosis in the setting of lactic acidosis Plan Repeat lactate, I suspect this is trending down as his pressor requirements are improving  continuing volume resuscitation Holding antihypertensives and diuretics Renal dose medications as appropriate A.m. chemistry Strict intake and output  Severe coagulopathy.  In the setting of prior Coumadin, also sepsis, also probably further exacerbated by administration of Flagyl Plan Continue to hold Coumadin FFP x2 A.m. INR  Anemia without evidence of bleeding Plan Continue to trend Transfuse for hemoglobin less than seven or active bleeding  Mild thrombocytopenia This is actually improving, I would interpret this is a reassuring sign Plan Continue to trend CBC   Adrenal insufficiency: Secondary to pituitary tumor resection in past.  At home takes 20 mg and 10 mg in the morning and afternoon respectively of hydrocortisone. Plan  Stress dose steroids for now resume home regimen once shock resolved   Hypothyroidism Plan Continue Synthroid  Ischemic cardiomyopathy: EF 45% in 2020.  Bedside ultrasound with what appears to be pretty normal EF with admittedly poor windows. Plan Continue to hold his carvedilol and Lasix Await echocardiogram results-  Atrial fibrillation with RVR: A. fib is chronic. -Rate control controlled currently, more stable after volume resuscitation Plan Continue telemetry monitoring for now   Hyperglycemia. Suspect at least somewhat exacerbated by his higher dose steroid Plan Initiate sliding  scale insulin Blood glucose goal 140-180  Best practice:  Diet: N.p.o. for now Pain/Anxiety/Delirium protocol (if indicated): Morphine as needed VAP protocol (if indicated): N/A DVT prophylaxis: Fully anticoagulated with warfarin, elevated INR GI prophylaxis: N/A Glucose control: N/A Mobility: Bedrest Code Status:  Full Family Communication: With wife at bedside Disposition: Admit to ICU   My cct 45 minutes,    Erick Colace ACNP-BC [place] Pager # [phone] OR # [phone] if no answer

## 2019-09-12 NOTE — Progress Notes (Signed)
  Echocardiogram 2D Echocardiogram has been performed.  Jennette Dubin 09/12/2019, 1:39 PM

## 2019-09-12 NOTE — Progress Notes (Signed)
eLink Physician-Brief Progress Note Patient Name: Zachary Willis DOB: 01-29-1945 MRN: [id number]   Date of Service  09/12/2019  HPI/Events of Note  Notified of coagulopathy.   Pt on coumadin, that has been held since admission but INR at >10 with no signs of bleeding.    eICU Interventions  Give Vitamin K 2.5mg  IV.      Intervention Category Intermediate Interventions: Other:  [name] 09/12/2019, 5:06 AM

## 2019-09-12 NOTE — Progress Notes (Signed)
Pharmacy Antibiotic Note  Zachary Willis is a 74 y.o. male with a h/o metastatic colon cancer with last chemo 10 days ago admitted on 09/11/2019 with sepsis.  Pharmacy has been consulted for vancomycin dosing.  09/12/19 9:29 AM  - SCr bumped to 1.67 - C diff PCR back +, chief candidate for source currently though questionable acute diarrhea history - afebrile  Plan: Decrease vancomycin dosing to 1000 mg iv q 24 hours Predicted AUC 448  Decrease cefepime to 2 g iv q 12 hours  Vancomyicn 125 mg po q 6 hours  F/U renal function, culture results, clinical course  Height: 5\' 10"  (177.8 cm) Weight: 114.4 kg (252 lb 3.3 oz) IBW/kg (Calculated) : 73  Temp (24hrs), Avg:98.6 F (37 C), Min:97.9 F (36.6 C), Max:99.7 F (37.6 C)  Recent Labs  Lab 09/11/19 1052 09/11/19 1400 09/11/19 2237 09/12/19 0300  WBC 1.4*  --   --  6.1  CREATININE 1.23  --  1.57* 1.67*  LATICACIDVEN 1.8 4.3*  --   --     Estimated Creatinine Clearance: 51.4 mL/min (A) (by C-G formula based on SCr of 1.67 mg/dL (H)).    No Known Allergies  Antimicrobials this admission: cefepime 9/8 >>  Vancomycin iv 9/8 >>  Vancomycin po 9/8 >> Metronidazole 9/8 x 1  Dose adjustments this admission: 9/9 vanc dec, cefepime dec  Microbiology results: 9/8 BCx 1 set: ngtd 9/8 UCx: ngf  9/8 MRSA PCR: neg 9/8 C diff: antigen pos, toxin neg, PCR + 9/8 COVID: neg  Thank you for allowing pharmacy to be a part of this patients care.  Ulice Dash D 09/12/2019 9:28 AM

## 2019-09-12 NOTE — Progress Notes (Signed)
Initial Nutrition Assessment  DOCUMENTATION CODES:   Obesity unspecified  INTERVENTION:  - will order [employer] 1.4 po BID, each supplement provides 455 kcal and 20 grams protein. - will order Magic Cup BID with meals, each supplement provides 290 kcal and 9 grams of protein. - will order 1 tablet multivitamin with minerals/day.  - continue diet advancement as medically feasible.   NUTRITION DIAGNOSIS:   Increased nutrient needs related to acute illness, catabolic illness, cancer and cancer related treatments as evidenced by estimated needs.  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 He presented to the ED with couple days of diarrhea, low blood pressure, new fever the AM of presentation. He was admitted for presumed septic shock.  Diet advanced from [facility] to [place] a short time ago in preparation for lunch. Able to talk with RN prior to visiting patient's room.   Patient laying in bed with wife at bedside. He tolerated chicken broth and juice for breakfast without issue. He is thankful that diet is advanced for lunch.   Patient usually has a good appetite and is able to eat without issue, but has had a decreased appetite and oral/throat pain (thought to be thrush) after the past 2 chemo cycles. For the few days after chemo, he consumes very soft items such as pudding, mashed potatoes, yogurt, etc.   He denies any N/V now or in the time PTA. He had a pituitary tumor in childhood and since the age of 74 years old, he has had numbness on the R side of mouth and throat. This can make taking pills and PO items difficult as he  cannot feel if they have moved into his throat or not.   It is felt that over the past 1 week he has lost weight due to symptoms and current hx of Cdiff positive. Weight yesterday was 252 lb and PTA the most recently documented weight was on 8/10 when he weighed 256 lb. This indicates 4 lb weight loss (1.6% body weight).    Labs reviewed; BUN: 32 mg/d,, creatinine: 1.67 mg/dl, Ca: 7.4 mg/dl, Phos: 5.5 mg/dl, Alk Phos elevated, GFR: 41 ml/min.  Medications reviewed; sliding scale novolog, 88 mcg oral synthroid/day, PRN magic mouthwash, 40 mg oral protonix/day, 2.5 mg IV vitamin K x1 dose 9/9.    NUTRITION - FOCUSED PHYSICAL EXAM:  completed; no muscle or fat depletions, no edema noted at this time.   Diet Order:   Diet Order            Diet full liquid Room service appropriate? Yes; Fluid consistency: Thin  Diet effective now                 EDUCATION NEEDS:   No education needs have been identified at this time  Skin:  Skin Assessment: Reviewed RN Assessment  Last BM:  PTA/unknown  Height:   Ht Readings from Last 1 Encounters:  09/11/19 $RemoveB'5\' 10"'TBzsnZke$  (1.778 m)    Weight:   Wt Readings from Last 1 Encounters:  09/12/19 114.4 kg     Estimated Nutritional Needs:  Kcal:  2500-3704 kcal Protein:  115-130 grams Fluid:  >/= 2.5 L/day  Zachary Hupfer, MS, RD, LDN, CNSC Inpatient Clinical Dietitian RD pager # available in AMION  After hours/weekend pager # available in AMION  

## 2019-09-12 NOTE — TOC Initial Note (Signed)
Transition of Care [facility]) - Initial/Assessment Note    Patient Details  Name: Zachary Willis MRN: [id number] Date of Birth: 1945/06/05  Transition of [facility]) CM/SW Contact:    Leeroy Cha, RN Phone Number: 09/12/2019, 8:45 AM  Clinical Narrative:                 Admitted with sepsis, [place] at 3l/min, iv solu cortef, iv maxipime, iv levophed, vasopressin, iv vanco, prothrombin time >90 and INR>10. Plan is to return to home following for progression  Expected Discharge Plan: Home/Self Care Barriers to Discharge: Continued Medical Work up   Patient Goals and CMS Choice Patient states their goals for this hospitalization and ongoing recovery are:: to return home      Expected Discharge Plan and Services Expected Discharge Plan: Home/Self Care   Discharge Planning Services: CM Consult   Living arrangements for the past 2 months: Single Family Home                                      Prior Living Arrangements/Services Living arrangements for the past 2 months: Single Family Home Lives with:: Spouse Patient language and need for interpreter reviewed:: Yes Do you feel safe going back to the place where you live?: Yes      Need for Family Participation in Patient Care: Yes (Comment) Care giver support system in place?: Yes (comment)   Criminal Activity/Legal Involvement Pertinent to Current Situation/Hospitalization: No - Comment as needed  Activities of Daily Living Home Assistive Devices/Equipment: Walker (specify type), Eyeglasses, Dentures (specify type) (upper partial plate) ADL Screening (condition at time of admission) Patient's cognitive ability adequate to safely complete daily activities?: Yes Is the patient deaf or have difficulty hearing?: No Does the patient have difficulty seeing, even when wearing glasses/contacts?: Yes (hx pituitary tumor, sees double if looks out both eyes, usually keeps right eye closed) Does the patient have difficulty  concentrating, remembering, or making decisions?: No Patient able to express need for assistance with ADLs?: Yes Does the patient have difficulty dressing or bathing?: No Independently performs ADLs?: No Communication: Independent Dressing (OT): Needs assistance Is this a change from baseline?: Change from baseline, expected to last >3 days Grooming: Independent Feeding: Independent Bathing: Needs assistance Is this a change from baseline?: Change from baseline, expected to last >3 days Toileting: Needs assistance Is this a change from baseline?: Change from baseline, expected to last >3days In/Out Bed: Needs assistance Is this a change from baseline?: Change from baseline, expected to last >3 days Walks in Home: Needs assistance Is this a change from baseline?: Change from baseline, expected to last >3 days Does the patient have difficulty walking or climbing stairs?: Yes Weakness of Legs: Both Weakness of Arms/Hands: Both  Permission Sought/Granted                  Emotional Assessment Appearance:: Appears stated age Attitude/Demeanor/Rapport: Engaged Affect (typically observed): Calm Orientation: : Oriented to Place, Oriented to Self, Oriented to  Time, Oriented to Situation Alcohol / Substance Use: Not Applicable Psych Involvement: No (comment)  Admission diagnosis:  Shock ([place]) [R57.9] Severe sepsis ([place]) [A41.9, R65.20] Patient Active Problem List   Diagnosis Date Noted  . Shock ([place]) 09/11/2019  . Colon cancer metastasized to liver ([place]) 08/30/2017  . Goals of care, counseling/discussion 08/30/2017  . NEPHROLITHIASIS 12/28/2009  . ABDOMINAL PAIN, RIGHT LOWER QUADRANT 12/28/2009   PCP:  Tonye Becket., MD Pharmacy:   [id number] - [place], [place] [address] Phone: [phone] Fax: [phone]     Social Determinants of Health (SDOH) Interventions    Readmission Risk  Interventions No flowsheet data found.

## 2019-09-13 DIAGNOSIS — A419 Sepsis, unspecified organism: Secondary | ICD-10-CM

## 2019-09-13 DIAGNOSIS — R6521 Severe sepsis with septic shock: Secondary | ICD-10-CM

## 2019-09-13 LAB — COMPREHENSIVE METABOLIC PANEL
ALT: 28 U/L (ref 0–44)
AST: 24 U/L (ref 15–41)
Albumin: 2.5 g/dL — ABNORMAL LOW (ref 3.5–5.0)
Alkaline Phosphatase: 88 U/L (ref 38–126)
Anion gap: 14 (ref 5–15)
BUN: 32 mg/dL — ABNORMAL HIGH (ref 8–23)
CO2: 22 mmol/L (ref 22–32)
Calcium: 7.6 mg/dL — ABNORMAL LOW (ref 8.9–10.3)
Chloride: 101 mmol/L (ref 98–111)
Creatinine, Ser: 1.29 mg/dL — ABNORMAL HIGH (ref 0.61–1.24)
GFR calc Af Amer: >60 mL/min (ref >60)
GFR calc non Af Amer: 55 mL/min — ABNORMAL LOW (ref >60)
Glucose, Bld: 232 mg/dL — ABNORMAL HIGH (ref 70–99)
Potassium: 3 mmol/L — ABNORMAL LOW (ref 3.5–5.1)
Sodium: 137 mmol/L (ref 135–145)
Total Bilirubin: 2.3 mg/dL — ABNORMAL HIGH (ref 0.3–1.2)
Total Protein: 4.8 g/dL — ABNORMAL LOW (ref 6.5–8.1)

## 2019-09-13 LAB — PREPARE FRESH FROZEN PLASMA: Unit division: 0

## 2019-09-13 LAB — CBC
HCT: 23 % — ABNORMAL LOW (ref 39.0–52.0)
HCT: 23.6 % — ABNORMAL LOW (ref 39.0–52.0)
Hemoglobin: 7.3 g/dL — ABNORMAL LOW (ref 13.0–17.0)
Hemoglobin: 7.5 g/dL — ABNORMAL LOW (ref 13.0–17.0)
MCH: 30 pg (ref 26.0–34.0)
MCH: 30.4 pg (ref 26.0–34.0)
MCHC: 31.7 g/dL (ref 30.0–36.0)
MCHC: 31.8 g/dL (ref 30.0–36.0)
MCV: 94.7 fL (ref 80.0–100.0)
MCV: 95.5 fL (ref 80.0–100.0)
Platelets: 40 10*3/uL — ABNORMAL LOW (ref 150–400)
Platelets: 42 10*3/uL — ABNORMAL LOW (ref 150–400)
RBC: 2.43 MIL/uL — ABNORMAL LOW (ref 4.22–5.81)
RBC: 2.47 MIL/uL — ABNORMAL LOW (ref 4.22–5.81)
RDW: 17.5 % — ABNORMAL HIGH (ref 11.5–15.5)
RDW: 17.5 % — ABNORMAL HIGH (ref 11.5–15.5)
WBC: 1.5 10*3/uL — ABNORMAL LOW (ref 4.0–10.5)
WBC: 2.4 10*3/uL — ABNORMAL LOW (ref 4.0–10.5)
nRBC: 3.3 % — ABNORMAL HIGH (ref 0.0–0.2)
nRBC: 2.9 % — ABNORMAL HIGH (ref 0.0–0.2)

## 2019-09-13 LAB — BPAM FFP
Blood Product Expiration Date: 202109142359
Blood Product Expiration Date: 202109142359
ISSUE DATE / TIME: 202109091436
ISSUE DATE / TIME: 202109091626
Unit Type and Rh: 5100
Unit Type and Rh: 9500

## 2019-09-13 LAB — CBC WITH DIFFERENTIAL/PLATELET
Abs Immature Granulocytes: 0.01 10*3/uL (ref 0.00–0.07)
Basophils Absolute: 0 10*3/uL (ref 0.0–0.1)
Basophils Relative: 1 %
Eosinophils Absolute: 0 10*3/uL (ref 0.0–0.5)
Eosinophils Relative: 0 %
HCT: 24.5 % — ABNORMAL LOW (ref 39.0–52.0)
Hemoglobin: 7.9 g/dL — ABNORMAL LOW (ref 13.0–17.0)
Immature Granulocytes: 1 %
Lymphocytes Relative: 24 %
Lymphs Abs: 0.4 10*3/uL — ABNORMAL LOW (ref 0.7–4.0)
MCH: 30.2 pg (ref 26.0–34.0)
MCHC: 32.2 g/dL (ref 30.0–36.0)
MCV: 93.5 fL (ref 80.0–100.0)
Monocytes Absolute: 0.2 10*3/uL (ref 0.1–1.0)
Monocytes Relative: 10 %
Neutro Abs: 1.2 10*3/uL — ABNORMAL LOW (ref 1.7–7.7)
Neutrophils Relative %: 64 %
Platelets: 59 10*3/uL — ABNORMAL LOW (ref 150–400)
RBC: 2.62 MIL/uL — ABNORMAL LOW (ref 4.22–5.81)
RDW: 17.5 % — ABNORMAL HIGH (ref 11.5–15.5)
WBC: 1.8 10*3/uL — ABNORMAL LOW (ref 4.0–10.5)
nRBC: 1.7 % — ABNORMAL HIGH (ref 0.0–0.2)

## 2019-09-13 LAB — PROTIME-INR
INR: 1.8 — ABNORMAL HIGH (ref 0.8–1.2)
Prothrombin Time: 19.8 seconds — ABNORMAL HIGH (ref 11.4–15.2)

## 2019-09-13 LAB — PHOSPHORUS: Phosphorus: 3.8 mg/dL (ref 2.5–4.6)

## 2019-09-13 LAB — GLUCOSE, CAPILLARY
Glucose-Capillary: 207 mg/dL — ABNORMAL HIGH (ref 70–99)
Glucose-Capillary: 225 mg/dL — ABNORMAL HIGH (ref 70–99)
Glucose-Capillary: 214 mg/dL — ABNORMAL HIGH (ref 70–99)
Glucose-Capillary: 195 mg/dL — ABNORMAL HIGH (ref 70–99)

## 2019-09-13 LAB — MAGNESIUM: Magnesium: 2 mg/dL (ref 1.7–2.4)

## 2019-09-13 MED ORDER — POTASSIUM CHLORIDE 10 MEQ/50ML IV SOLN
10.0000 meq | INTRAVENOUS | Status: AC
  Administered 2019-09-13 (×4): 10 meq via INTRAVENOUS
  Filled 2019-09-13 (×4): qty 50

## 2019-09-13 MED ORDER — LIP MEDEX EX OINT
TOPICAL_OINTMENT | CUTANEOUS | Status: DC | PRN
  Filled 2019-09-13: qty 7

## 2019-09-13 MED ORDER — LACTATED RINGERS IV SOLN: INTRAVENOUS | Status: DC

## 2019-09-13 MED ORDER — POTASSIUM CHLORIDE CRYS ER 20 MEQ PO TBCR: 20.0000 meq | EXTENDED_RELEASE_TABLET | ORAL | Status: DC

## 2019-09-13 MED ORDER — TBO-FILGRASTIM 480 MCG/0.8ML ~~LOC~~ SOSY
480.0000 ug | PREFILLED_SYRINGE | Freq: Once | SUBCUTANEOUS | Status: AC
  Administered 2019-09-13: 480 ug via SUBCUTANEOUS
  Filled 2019-09-13: qty 0.8

## 2019-09-13 NOTE — Evaluation (Signed)
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  Pt currently with functional limitations due to the deficits listed below (see PT Problem List). Pt will benefit from skilled PT to increase their independence and safety with mobility to allow discharge to the venue listed below.  Pt off pressor this morning per RN and BP dropped to 97/56 mmHg with sitting EOB. Pt c/o dizziness however resolved within 30 seconds.  Pt sat EOB at least 10 minutes and moved LEs.  RN declines pt OOB at this time and RN and nurse tech into room to assist with bathing and changing linen (condom cath off and bed soaked with urine on arrival).  Pt's mobility likely to improve once more medically stable.  Pt also motivated and anticipates OOB next session.     Follow Up Recommendations Home health PT;Supervision/Assistance - 24 hour    Equipment Recommendations  None recommended by PT    Recommendations for Other Services       Precautions / Restrictions Precautions Precautions: Fall Precaution Comments: monitor BP      Mobility  Bed Mobility Overal bed mobility: Needs Assistance Bed Mobility: Supine to Sit     Supine to sit: Supervision     General bed mobility comments: supervision for lines  Transfers                 General transfer comment: unable at this time due to low BP (RN had stopped pressor earlier), pt  was eager to get to chair however explained low BP (double checked with RN, wants pt back in bed)  Ambulation/Gait                Stairs            Wheelchair Mobility    Modified Rankin (Stroke Patients Only)       Balance Overall balance assessment: Needs assistance Sitting-balance support: Feet supported;No upper extremity supported Sitting balance-Leahy Scale: Fair                                       Pertinent Vitals/Pain Pain Assessment: No/denies pain    Home Living Family/patient expects to be discharged to:: Private residence Living Arrangements: Spouse/significant other   Type of Home: House Home Access: Level entry     Home Layout: Able to live on main level with bedroom/bathroom Home Equipment: Walker - 2 wheels;Cane - single point      Prior Function Level of Independence: Independent         Comments: states he uses RW when needed after cancer treatments     Hand Dominance        Extremity/Trunk Assessment        Lower Extremity Assessment Lower Extremity Assessment: Generalized weakness    Cervical / Trunk Assessment Cervical / Trunk Assessment: Other exceptions Cervical / Trunk Exceptions: right shoulder resting lower then left  Communication  Communication: No difficulties  Cognition Arousal/Alertness: Awake/alert Behavior During Therapy: WFL for tasks assessed/performed Overall Cognitive Status: Within Functional Limits for tasks assessed                                        General Comments      Exercises     Assessment/Plan    PT Assessment Patient needs continued PT services  PT Problem List Decreased strength;Decreased mobility;Decreased activity tolerance;Decreased balance;Decreased knowledge of use of DME       PT Treatment Interventions DME instruction;Therapeutic exercise;Stair training;Functional mobility training;Therapeutic activities;Patient/family  education;Gait training;Balance training    PT Goals (Current goals can be found in the Care Plan section)  Acute Rehab PT Goals PT Goal Formulation: With patient Time For Goal Achievement: 09/27/19 Potential to Achieve Goals: Good    Frequency Min 3X/week   Barriers to discharge        Co-evaluation               AM-PAC PT "6 Clicks" Mobility  Outcome Measure Help needed turning from your back to your side while in a flat bed without using bedrails?: None Help needed moving from lying on your back to sitting on the side of a flat bed without using bedrails?: None Help needed moving to and from a bed to a chair (including a wheelchair)?: A Little Help needed standing up from a chair using your arms (e.g., wheelchair or bedside chair)?: A Little Help needed to walk in hospital room?: A Lot Help needed climbing 3-5 steps with a railing? : A Lot 6 Click Score: 18    End of Session   Activity Tolerance: Patient tolerated treatment well Patient left: in bed;with nursing/sitter in room (RN and tech in room to change linen and bathe) Nurse Communication: Mobility status PT Visit Diagnosis: Difficulty in walking, not elsewhere classified (R26.2)    Time: 0626-9485 PT Time Calculation (min) (ACUTE ONLY): 25 min   Charges:   PT Evaluation $PT Eval Low Complexity: 1 Low        Kati PT, DPT Acute Rehabilitation Services Pager: [phone] Office: [phone]   York Ram E 09/13/2019, 1:08 PM

## 2019-09-13 NOTE — Progress Notes (Signed)
ANTICOAGULATION CONSULT NOTE - Initial Consult  Pharmacy Consult for warfarin Indication: atrial fibrillation  No Known Allergies  Patient Measurements: Height: 5\' 10"  (177.8 cm) Weight: 117.3 kg (258 lb 9.6 oz) IBW/kg (Calculated) : 73  Vital Signs: Temp: 97.6 F (36.4 C) (09/10 0800) Temp Source: Oral (09/10 0800) BP: 117/72 (09/10 0800) Pulse Rate: 89 (09/10 0800)  Labs: Recent Labs    09/11/19 1052 09/11/19 1052 09/11/19 2237 09/12/19 0300 09/13/19 0500 09/13/19 0655  HGB 10.4*   < >  --  10.3*  --  7.9*  HCT 32.6*  --   --  32.9*  --  24.5*  PLT 74*  --   --  129*  --  59*  APTT 57*  --   --   --   --   --   LABPROT 69.2*  --   --  >90.0* 19.8*  --   INR 8.7*  --   --  >10.0* 1.8*  --   CREATININE 1.23   < > 1.57* 1.67* 1.29*  --    < > = values in this interval not displayed.    Estimated Creatinine Clearance: 67.4 mL/min (A) (by C-G formula based on SCr of 1.29 mg/dL (H)).   Medical History: Past Medical History:  Diagnosis Date   Anticoagulated on Coumadin    chronic long term   Antineoplastic chemotherapy induced anemia    Chronic atrial fibrillation [facility])    cardiologist--  dr Curly [facility] in [place])--- hx DVVC in 2006/  TEE 10-21-2015 (care everywhere)  ef 55-60%, moderate dilated RA, mild MR, RVSP 63mmHg   CKD (chronic kidney disease), stage II    Colon cancer metastasized to liver [facility])    current oncologist-- dr Marin Olp (previous oncologist at [facility] oncology)--- dx 12/ 2017,  Stage IIb ([id number]),  s/p  resection colon tumor 01-01-2016 ,  recurrent w/ liver mets via bx 08/ 2018  , had chemo then partial hepatectomy and completed chemo after surgery/ liver mets recurrence 08/ 2019   Cushing's disease ([place])    endocriniologist-- dr Elisabeth Most [facility])   Droopy eyelid, right    11-15-2017   GERD (gastroesophageal reflux disease)    Goals of care, counseling/discussion 08/30/2017   Gout    11-15-2017 per pt last flare-up 2018    History of benign pituitary tumor    resection 07-25-2013   History of cancer chemotherapy    COMPLETED 02-20-2017  FOR  COLON CANCER WITH LIVER METS   History of radiation therapy    03/ 2016  remainder of pituitary tumor   Hypertension    Hypertensive cardiovascular disease    Hypothyroidism, secondary    OA (osteoarthritis)    Renal insufficiency    Secondary adrenal insufficiency ([place])    Secondary male hypogonadism    Wears glasses    Wears partial dentures    upper and lower    Medications: Warfarin 2.5 mg and 5 mg alternating every other day PTA INR on admission: 8.7  Assessment: Pt is a 43 yoM on warfarin PTA for history of atrial fibrillation. PMH significant for metastatic colon cancer.  Pt admitted with mutifactorial shock (septic, hypovolemic), currently on PO vancomycin for c.diff.    Significant Events: -9/8: INR 8.7, metronidazole dose given -9/9: INR >10, no bleeding. Vitamin K 2.5 mg IV, 10 mg IV given -9/10: INR 1.8, resume warfarin  Today, 09/13/19  INR = 1.8 is slightly subtherapeutic s/p IV vitamin K  CBC: Hgb (7.9), Plt (59)  both low and decreased; no evidence of bleeding  Diet: Heart healthy, carb modified  Goal of Therapy:  INR 2-3 Monitor platelets by anticoagulation protocol: Yes   Plan:   Discussed with MD - no anticoagulation bridge at this time. Resume warfarin dosing on 9/11  INR with AM labs tomorrow  Lenis Noon, PharmD 09/13/2019,12:22 PM

## 2019-09-13 NOTE — Progress Notes (Addendum)
Zachary Willis is slowly improving.  He is now on only one pressor.  Blood pressure this morning is adequate.Marland Kitchen  He is still having some diarrhea.  He is positive for C. difficile by PCR.  Blood cultures surprisingly are negative.  He was little confused when he woke up.  He says he is had this during the night.  I suppose this is not all that surprising.  I am not sure he is eating all that much.  There are no cardiac issues that I can tell.  He had a echocardiogram yesterday.  His left ventricular ejection fraction is 40-45%.  He has a dilated left atrium.  There is a small pericardial effusion.  The heart valve looked okay.  This morning, his blood sugar is 232.  BUN is 32 creatinine 1.29.  Calcium 7.6 with an albumin of 2.5.  His total bilirubin is 2.3.  I am quite surprised by the blood count.  His white cell count is 1.8.  I cannot figure out why this would be since his white cell count yesterday was 6.1.  His platelet count is 59,000.  His hemoglobin is 7.9.  We will give a dose of Neupogen.  I need to make sure that we try to keep his white cell count up.  His INR is down to 1.8.  He is going to need to be on anticoagulation in my opinion.  I would have him on heparin because of the atrial fibrillation.  I believe that he can have the Coumadin held for right now.  I would want to make sure that the diarrhea is under good control before restarting the Coumadin.   Again, I just do not understand why his blood counts have worsened.  We will clearly have to watch this closely.  He is not complaining of as much bony pain.  His vital signs all look pretty good.  His temperature is 97.6.  Pulse 79.  Blood pressure 108/71.  Is heart is irregular rate and irregular rhythm consistent with atrial fibrillation.  The heart rate is better.  His lungs sound fairly clear bilaterally.  Abdomen is soft.  Has laparotomy scars.  There is no guarding or rebound tenderness.  I really cannot palpate any spleen tip.   Extremities do show some edema in the legs bilaterally.  Neurological exam does show some slight confusion to place.  Otherwise, he seems to be fairly alert.  Hopefully, he will be able to come off the norepinephrine drip today.  I am sure that he is getting fantastic care from the staff down in the ICU.  I know that the staff will do a fantastic job and will show incredible compassion for him.  Lattie Haw, MD  Psalms 145:18

## 2019-09-13 NOTE — Progress Notes (Addendum)
Inpatient Diabetes Program Recommendations  AACE/ADA: New Consensus Statement on Inpatient Glycemic Control (2015)  Target Ranges:  Prepandial:   less than 140 mg/dL      Peak postprandial:   less than 180 mg/dL (1-2 hours)      Critically ill patients:  140 - 180 mg/dL   Lab Results  Component Value Date   GLUCAP 207 (H) 09/13/2019   HGBA1C 8.9 (H) 09/12/2019    Review of Glycemic Control Results for Zachary Willis, Zachary Willis (MRN [id number]) as of 09/13/2019 11:11  Ref. Range 09/12/2019 12:27 09/12/2019 13:27 09/12/2019 16:31 09/12/2019 22:01 09/13/2019 07:37  Glucose-Capillary Latest Ref Range: 70 - 99 mg/dL 186 (H) 201 (H) 228 (H) 195 (H) 207 (H)   Diabetes history: None  Current orders for Inpatient glycemic control:  Novolog 0-15 units tid + hs  A1c 8.9% on 9/9 Decadron 8 mg x2 days after chemo treatments Solucortef 50 mg Q6 hours  Inpatient Diabetes Program Recommendations:    -Consider Levemir 10 units  Glucose 120's on admission prior to solumedrol. No History of DM. Pt would benefit from Glucometer checks outpatient once a day in the evenings prior to a meal and discuss readings with his doctor. May need glucose control on days he receives decadron.  Thanks,  Tama Headings RN, MSN, BC-ADM Inpatient Diabetes Coordinator Team Pager [phone] (8a-5p)

## 2019-09-13 NOTE — Progress Notes (Signed)
NAME:  EDMON MAGID, MRN:  [id number], DOB:  Feb 16, 1945, LOS: 2 ADMISSION DATE:  09/11/2019, CONSULTATION DATE:  09/13/19 REFERRING MD:  ED - Dr. 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  Past Medical History  CAD, colon cancer, pituitary tumor status post resection, ischemic cardiomyopathy.  Significant Hospital Events   9/8-presents to ED with c/o persisted diarrhea post chemo, hypotensive despite IVF resuscitation started on pressure started, admitted to ICU 9/9: Systolic blood pressure stabilizing.  Heart rate better.  Still on pressors but requirements improved.  No nausea vomiting or diarrhea overnight.  Did sustain mild acute kidney injury, INR greater than 10.  Stopping Flagyl.  Giving FFP.  Allowing clear liquid diet. 9/9: Oncology consulted for metastatic colon cancer follow up.  9/10 still on pressors but weaning.   Consults:  N/A  Procedures:  IO placement in right humerus in ED   Significant Diagnostic Tests:  Test x-ray clear, UA clean, C. difficile screen indeterminate  Micro Data:  Urine culture 9/8: Negative Blood culture 9/8>>> C. difficile toxin PCR 9/8: Positive C. difficile quick screen PCR: Antigen positive, toxin negative Coronavirus 2 PCR: Negative  Antimicrobials:  Cefepime 9/8: Vancomycin 9/8: Oral vancomycin 9/8: IV Flagyl 9/8>>> 9/9  Interim history/subjective:  Feels better did have some mild orthostasis when sitting up for PT Objective   Blood pressure 117/72, pulse 89, temperature 97.6 F (36.4 C), temperature source Oral, resp. rate 13, height 5\' 10"   (1.778 m), weight 117.3 kg, SpO2 97 %.        Intake/Output Summary (Last 24 hours) at 09/13/2019 0854 Last data filed at 09/13/2019 0800 Gross per 24 hour  Intake 1220.63 ml  Output 975 ml  Net 245.63 ml   Filed Weights   09/11/19 1740 09/12/19 0318 09/13/19 0500  Weight: 114.4 kg 114.4 kg 117.3 kg    Examination: General this is a pleasant 74 year old white male resting in bed HENT on-going left facial droop. This is chronic. MM still dry. Neck veins flat pulm clear  Card reg irreg abd not tender but still having liq stool Ext diffuse edema and ecchymosis  Neuro awake and oriented.  Resolved Hospital Problem list   Metabolic acidosis resolved   Assessment & Plan:   Multifactorial shock-septic and hypovolemic in the setting of diarrhea exacerbated by adrenal insufficiency His C. Difficile toxin PCR was positive.  With lack of other source we will continue to treat as such I do think he is still volume depleted Plan Wean norepi to off--> goals SBP > 100 Will cont MIVF at lower rate and change to NSL once taking good PO Dc cefepime Complete 10 days oral vanc    Febrile neutropenia: In the setting of recent chemotherapy.  Received Neulasta a few days ago.  Plan As above  Acute kidney injury  -renal fxn improving  Plan Renal dose meds Avoid volume depletion  Am chemistry   Severe coagulopathy.  In the setting of prior Coumadin, also sepsis, also probably further exacerbated by administration of Flagyl-->better now down to 1.8 after 2 FFP and Vit K  Plan Hold coumadin another 24  hrs.  Resume coumadin per pharmacy 9/11  Anemia without evidence of bleeding ->hgb dropping. No evidence of bleeding (suspect that this is error) Plan Repeat CBC Transfuse for hgb < 7 Holding ac   Mild thrombocytopenia Had been improving, I would interpret this is a reassuring sign but now all cbc  Plan Repeat cbc   Adrenal insufficiency: Secondary to pituitary tumor resection in  past.  At home takes 20 mg and 10 mg in the morning and afternoon respectively of hydrocortisone. Plan  Cont current stress dose steroids Once off pressors for 24 hrs we will resume home rx.   Hypothyroidism Plan Cont synthroid   Ischemic cardiomyopathy: EF 45% in 2020.  Bedside ultrasound with what appears to be pretty normal EF with admittedly poor windows. Plan Holding carvedilol and lasix  Cont tele   Atrial fibrillation with RVR: A. fib is chronic. -Rate control controlled currently, more stable after volume resuscitation Plan Cont tele Holding bb (resume once no longer in shock)  Hyperglycemia. Suspect at least somewhat exacerbated by his higher dose steroid Plan Cont ssi  Will ask diabetic coordinator to eval   Best practice:  Diet: N.p.o. for now Pain/Anxiety/Delirium protocol (if indicated): Morphine as needed VAP protocol (if indicated): N/A DVT prophylaxis: Fully anticoagulated with warfarin, elevated INR GI prophylaxis: N/A Glucose control: N/A Mobility: Bedrest Code Status: Full Family Communication: With wife at bedside Disposition: Admit to ICU  My cct 34 minutes.,    Erick Colace ACNP-BC [place] Pager # [phone] OR # [phone] if no answer

## 2019-09-14 LAB — COMPREHENSIVE METABOLIC PANEL WITH GFR
ALT: 26 U/L (ref 0–44)
AST: 18 U/L (ref 15–41)
Albumin: 2.3 g/dL — ABNORMAL LOW (ref 3.5–5.0)
Alkaline Phosphatase: 92 U/L (ref 38–126)
Anion gap: 12 (ref 5–15)
BUN: 34 mg/dL — ABNORMAL HIGH (ref 8–23)
CO2: 23 mmol/L (ref 22–32)
Calcium: 8.1 mg/dL — ABNORMAL LOW (ref 8.9–10.3)
Chloride: 102 mmol/L (ref 98–111)
Creatinine, Ser: 1.21 mg/dL (ref 0.61–1.24)
GFR calc Af Amer: >60 mL/min (ref >60)
GFR calc non Af Amer: 60 mL/min — ABNORMAL LOW (ref >60)
Glucose, Bld: 198 mg/dL — ABNORMAL HIGH (ref 70–99)
Potassium: 3.2 mmol/L — ABNORMAL LOW (ref 3.5–5.1)
Sodium: 137 mmol/L (ref 135–145)
Total Bilirubin: 1.7 mg/dL — ABNORMAL HIGH (ref 0.3–1.2)
Total Protein: 4.8 g/dL — ABNORMAL LOW (ref 6.5–8.1)

## 2019-09-14 LAB — CBC WITH DIFFERENTIAL/PLATELET
Abs Immature Granulocytes: 0.29 K/uL — ABNORMAL HIGH (ref 0.00–0.07)
Basophils Absolute: 0 K/uL (ref 0.0–0.1)
Basophils Relative: 0 %
Eosinophils Absolute: 0 K/uL (ref 0.0–0.5)
Eosinophils Relative: 0 %
HCT: 23.3 % — ABNORMAL LOW (ref 39.0–52.0)
Hemoglobin: 7.3 g/dL — ABNORMAL LOW (ref 13.0–17.0)
Immature Granulocytes: 11 %
Lymphocytes Relative: 22 %
Lymphs Abs: 0.6 K/uL — ABNORMAL LOW (ref 0.7–4.0)
MCH: 29.9 pg (ref 26.0–34.0)
MCHC: 31.3 g/dL (ref 30.0–36.0)
MCV: 95.5 fL (ref 80.0–100.0)
Monocytes Absolute: 0.2 K/uL (ref 0.1–1.0)
Monocytes Relative: 6 %
Neutro Abs: 1.6 K/uL — ABNORMAL LOW (ref 1.7–7.7)
Neutrophils Relative %: 61 %
Platelets: 38 K/uL — ABNORMAL LOW (ref 150–400)
RBC: 2.44 MIL/uL — ABNORMAL LOW (ref 4.22–5.81)
RDW: 17.4 % — ABNORMAL HIGH (ref 11.5–15.5)
WBC: 2.7 K/uL — ABNORMAL LOW (ref 4.0–10.5)
nRBC: 3.8 % — ABNORMAL HIGH (ref 0.0–0.2)

## 2019-09-14 LAB — GLUCOSE, CAPILLARY
Glucose-Capillary: 193 mg/dL — ABNORMAL HIGH (ref 70–99)
Glucose-Capillary: 227 mg/dL — ABNORMAL HIGH (ref 70–99)
Glucose-Capillary: 182 mg/dL — ABNORMAL HIGH (ref 70–99)

## 2019-09-14 LAB — PROTIME-INR
INR: 1.3 — ABNORMAL HIGH (ref 0.8–1.2)
Prothrombin Time: 15.3 seconds — ABNORMAL HIGH (ref 11.4–15.2)

## 2019-09-14 LAB — PHOSPHORUS: Phosphorus: 3 mg/dL (ref 2.5–4.6)

## 2019-09-14 LAB — MAGNESIUM: Magnesium: 2 mg/dL (ref 1.7–2.4)

## 2019-09-14 MED ORDER — POTASSIUM CHLORIDE 10 MEQ/50ML IV SOLN
10.0000 meq | INTRAVENOUS | Status: AC
  Administered 2019-09-14 (×6): 10 meq via INTRAVENOUS
  Filled 2019-09-14 (×6): qty 50

## 2019-09-14 MED ORDER — WARFARIN SODIUM 2.5 MG PO TABS
2.5000 mg | ORAL_TABLET | Freq: Once | ORAL | Status: AC
  Administered 2019-09-14: 2.5 mg via ORAL
  Filled 2019-09-14: qty 1

## 2019-09-14 MED ORDER — MORPHINE SULFATE (PF) 2 MG/ML IV SOLN: 2.0000 mg | INTRAVENOUS | Status: DC | PRN

## 2019-09-14 MED ORDER — FAMOTIDINE 20 MG PO TABS
20.0000 mg | ORAL_TABLET | Freq: Every day | ORAL | Status: DC
  Administered 2019-09-14 – 2019-09-20 (×7): 20 mg via ORAL
  Filled 2019-09-14 (×7): qty 1

## 2019-09-14 MED ORDER — WARFARIN - PHARMACIST DOSING INPATIENT: Freq: Every day | Status: DC

## 2019-09-14 MED ORDER — ACETAMINOPHEN 325 MG PO TABS: 650.0000 mg | ORAL_TABLET | Freq: Four times a day (QID) | ORAL | Status: DC | PRN

## 2019-09-14 MED ORDER — HYDROCORTISONE NA SUCCINATE PF 100 MG IJ SOLR
50.0000 mg | Freq: Three times a day (TID) | INTRAMUSCULAR | Status: DC
  Administered 2019-09-14 – 2019-09-15 (×3): 50 mg via INTRAVENOUS
  Filled 2019-09-14 (×3): qty 2

## 2019-09-14 MED ORDER — SIMETHICONE 80 MG PO CHEW
80.0000 mg | CHEWABLE_TABLET | Freq: Four times a day (QID) | ORAL | Status: DC
  Administered 2019-09-14 – 2019-09-20 (×27): 80 mg via ORAL
  Filled 2019-09-14 (×27): qty 1

## 2019-09-14 NOTE — Progress Notes (Signed)
Zachary Willis looks a whole lot better this morning.  He is more alert.  His "color" looks a little bit better.  He is off pressors now.  Blood pressure is 127/70.  He still having diarrhea.  He has the C. difficile colitis.  He is on treatment for this.  He is on oral vancomycin.  His CBC is not yet back.  His electrolytes look pretty good.  His BUN is 34 creatinine 1.21.  His blood sugar is 198.  His albumin is 2.3.    His INR is 1.3.  His platelet count was 42,000 yesterday.  I still think we have to wait for Coumadin to be restarted.  It is hard to say how much he is really eating.  He says he does not have any nausea.  There is no vomiting.  He has had no bleeding.  Again, cultures are still negative outside of the C. difficile.  His vital signs are temperature 97.5.  Pulse 88.  Blood pressure 127/70.  Oxygen saturation on room air is 100%.  Head and neck exam shows no mucositis.  Oral mucosa is a little bit moist.  His lungs sound relatively clear bilaterally.  He has good air movement bilaterally.  Cardiac exam regular rate and irregular rhythm.  This is consistent with atrial fibrillation.  Abdomen is soft.  Bowel sounds are present.  There is no guarding or rebound tenderness.  Extremities show some chronic pitting edema in lower legs.  Neurological exam shows no focal neurological deficits.  Zachary Willis has metastatic colon cancer.  He is admitted with diarrhea secondary to C. difficile.  This is slowly improving.  His "sepsis" seems to be resolving.  We will have to see what is blood counts are.  It is conceivable that he still needs to have a transfusion depending on what his red blood cell count is.  If his white blood cell count is down from yesterday, I would give him another dose of Neupogen.  I know that he is getting outstanding care from all the staff in the ICU.  Lattie Haw, MD  Colossians 3:23

## 2019-09-14 NOTE — Progress Notes (Signed)
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  Spouse present and followed with recliner.  Pt only requiring min assist at this time and fatigued quickly.  Pt agreeable to remaining OOB and in recliner end of session.   Follow Up Recommendations  Home health PT;Supervision/Assistance - 24 hour     Equipment Recommendations  None recommended by PT    Recommendations for Other Services       Precautions / Restrictions Precautions Precautions: Fall    Mobility  Bed Mobility Overal bed mobility: Needs Assistance Bed Mobility: Supine to Sit     Supine to sit: Min assist     General bed mobility comments: assist for trunk  Transfers Overall transfer level: Needs assistance Equipment used: Rolling walker (2 wheeled) Transfers: Sit to/from Stand Sit to Stand: Min assist         General transfer comment: slight assist to rise, cues for hand placement upon sitting in recliner  Ambulation/Gait Ambulation/Gait assistance: Min assist;+2 safety/equipment Gait Distance (Feet): 25 Feet Assistive device: Rolling walker (2 wheeled) Gait Pattern/deviations: Step-through pattern;Decreased stride length;Trunk flexed     General Gait Details: verbal cues for RW positioning, posture; spouse followed with recliner for safety; distance to tolerance; BP 140/82 mmHg after ambulating, pt denies any  symptoms   Stairs             Wheelchair Mobility    Modified Rankin (Stroke Patients Only)       Balance Overall balance assessment: Needs assistance         Standing balance support: Bilateral upper extremity supported Standing balance-Leahy Scale: Poor Standing balance comment: reliant on UE support                            Cognition Arousal/Alertness: Awake/alert Behavior During Therapy: WFL for tasks assessed/performed Overall Cognitive Status: Within Functional Limits for tasks assessed                                        Exercises      General Comments        Pertinent Vitals/Pain Pain Assessment: No/denies pain    Home Living                      Prior Function            PT Goals (current goals can now be found in the care plan section) Progress towards PT goals: Progressing toward goals    Frequency    Min 3X/week      PT Plan Current plan remains appropriate    Co-evaluation              AM-PAC PT "6 Clicks" Mobility   Outcome Measure  Help needed turning from your back to your side while in a  flat bed without using bedrails?: None Help needed moving from lying on your back to sitting on the side of a flat bed without using bedrails?: A Little Help needed moving to and from a bed to a chair (including a wheelchair)?: A Little Help needed standing up from a chair using your arms (e.g., wheelchair or bedside chair)?: A Little Help needed to walk in hospital room?: A Little Help needed climbing 3-5 steps with a railing? : A Lot 6 Click Score: 18    End of Session Equipment Utilized During Treatment: Gait belt Activity Tolerance: Patient tolerated treatment well Patient left: in chair;with call bell/phone within reach;with family/visitor present (on chair pad but no chair alarm box in room) Nurse Communication: Mobility status PT Visit Diagnosis: Difficulty in walking, not  elsewhere classified (R26.2)     Time: 1340-1403 PT Time Calculation (min) (ACUTE ONLY): 23 min  Charges:  $Gait Training: 8-22 mins                     Arlyce Dice, DPT Acute Rehabilitation Services Pager: [phone] Office: [phone]  York Ram E 09/14/2019, 4:22 PM

## 2019-09-14 NOTE — Progress Notes (Signed)
ANTICOAGULATION CONSULT NOTE - Initial Consult  Pharmacy Consult for warfarin Indication: atrial fibrillation  No Known Allergies  Patient Measurements: Height: 5\' 10"  (177.8 cm) Weight: 118.2 kg (260 lb 9.3 oz) IBW/kg (Calculated) : 73  Vital Signs: Temp: 97.6 F (36.4 C) (09/11 0814) Temp Source: Axillary (09/11 0814) BP: 115/61 (09/11 0600) Pulse Rate: 98 (09/11 0600)  Labs: Recent Labs    09/12/19 0300 09/13/19 0500 09/13/19 0655 09/13/19 1545 09/13/19 1545 09/13/19 2100 09/14/19 0430  HGB 10.3*  --    < > 7.3*   < > 7.5* 7.3*  HCT 32.9*  --    < > 23.0*  --  23.6* 23.3*  PLT 129*  --    < > 40*  --  42* 38*  LABPROT >90.0* 19.8*  --   --   --   --  15.3*  INR >10.0* 1.8*  --   --   --   --  1.3*  CREATININE 1.67* 1.29*  --   --   --   --  1.21   < > = values in this interval not displayed.    Estimated Creatinine Clearance: 72.2 mL/min (by C-G formula based on SCr of 1.21 mg/dL).   Medical History: Past Medical History:  Diagnosis Date  . Anticoagulated on Coumadin    chronic long term  . Antineoplastic chemotherapy induced anemia   . Chronic atrial fibrillation [facility])    cardiologist--  dr Curly Rim [facility] in [place])--- hx DVVC in 2006/  TEE 10-21-2015 (care everywhere)  ef 55-60%, moderate dilated RA, mild MR, RVSP 66mmHg  . CKD (chronic kidney disease), stage II   . Colon cancer metastasized to liver [facility])    current oncologist-- dr Marin Olp (previous oncologist at [facility] oncology)--- dx 12/ 2017,  Stage IIb ([id number]),  s/p  resection colon tumor 01-01-2016 ,  recurrent w/ liver mets via bx 08/ 2018  , had chemo then partial hepatectomy and completed chemo after surgery/ liver mets recurrence 08/ 2019  . Cushing's disease [facility])    endocriniologist-- dr Elisabeth Most [facility])  . Droopy eyelid, right    11-15-2017  . GERD (gastroesophageal reflux disease)   . Goals of care, counseling/discussion 08/30/2017  . Gout    11-15-2017 per pt last flare-up  2018  . History of benign pituitary tumor    resection 07-25-2013  . History of cancer chemotherapy    COMPLETED 02-20-2017  FOR  COLON CANCER WITH LIVER METS  . History of radiation therapy    03/ 2016  remainder of pituitary tumor  . Hypertension   . Hypertensive cardiovascular disease   . Hypothyroidism, secondary   . OA (osteoarthritis)   . Renal insufficiency   . Secondary adrenal insufficiency ([place])   . Secondary male hypogonadism   . Wears glasses   . Wears partial dentures    upper and lower    Medications: Warfarin 2.5 mg and 5 mg alternating every other day PTA INR on admission: 8.7  Assessment: Pt is a 71 yoM on warfarin PTA for history of atrial fibrillation. PMH significant for metastatic colon cancer.  Pt admitted with mutifactorial shock (septic, hypovolemic), currently on PO vancomycin for c.diff.    Significant Events: -9/8: INR 8.7, metronidazole dose given -9/9: INR >10, no bleeding. Vitamin K 2.5 mg IV, 10 mg IV given -9/10: INR 1.8 s/p vitamin K -9/11: Resume warfarin dosing  Today, 09/14/19  INR = 1.3 is subtherapeutic as expected s/p vitamin K  CBC: Hgb (  7.3), Plt (38) both low but relatively stable; no evidence of bleeding. Pt received chemotherapy on 8/31, likely contributing to low counts  Diet: Heart healthy, carb modified. Poor PO intake  No significant DDI  Goal of Therapy:  INR 2-3 Monitor platelets by anticoagulation protocol: Yes   Plan:   No bridge at this time per MD  Warfarin 2.5 mg PO once today  INR daily  Follow for signs/symptoms of bleeding  Lenis Noon, PharmD 09/14/2019,11:30 AM

## 2019-09-14 NOTE — Progress Notes (Signed)
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  Currently plan is treat diarrhea.  Assessment and Plan: Multifactorial shock-septic and hypovolemic in the setting of diarrhea exacerbated by adrenal insufficiency:  Chest x-ray clear, UA clear, no focal signs on exam.  Having diarrhea so presumed intra-abdominal source, C Diff PCR positive, toxin negative. D/C Vancomycin, cefepime as cultures negative at 48 hrs p.o. vancomycin given worsened diarrhea and lack of other source at this time norepinephrine, vaso weaned off  IV hydrocortisone 50 mg every 8 hours,  AKI:  Likely ATN in the setting of hypovolemia and shock. Monitor urine output  Febrile neutropenia:  In the setting of recent chemotherapy. Received Neulasta a few days ago.  No longer neutropenic Antibiotics as above.  Adrenal insufficiency:  Secondary to pituitary tumor resection in past.  At home takes 20 mg and 10 mg in the morning and afternoon respectively of hydrocortisone. Stress dose steroids as above  Hypothyroidism:  Continue Synthroid  Ischemic cardiomyopathy:  EF 45% in 2020. Bedside ultrasound on admission with what appears to be pretty normal EF, collapsible small IVC with admittedly poor windows. Hold home carvedilol, Lasix given hypotension Formal TTE EF 40-45% similar to prior  Atrial fibrillation with RVR: A. fib is chronic.  On warfarin at home for stroke prophylaxis, INR elevated greater than 8 on admission.  No active signs or symptoms of bleeding.  Hemoglobin stable/at baseline on initial check.  Rates improving as shock improves Hold warfarin, daily  INR INR 1.8, resume warfarin tomorrow, no need to bridge Hold carvedilol given hypotension  Anemia without evidence of bleeding Transfuse for hgb < 7 Holding ac   Mild thrombocytopenia improving,   Hypothyroidism Cont synthroid   Obesity: Body mass index is 37.39 kg/m.    Nutrition Problem: Increased nutrient needs Etiology: acute illness, catabolic illness, cancer and cancer related treatments Interventions: Interventions: MVI, Magic cup, Other (Comment) [employer] 1.4 po)  Diet: cardiac diet DVT Prophylaxis: Place and maintain sequential compression device Start: 09/14/19 0930 Place TED hose Start: 09/14/19 0930  Advance goals of care discussion: Full code  Family Communication: no family was present at bedside, at the time of interview.   Disposition:  Status is: Inpatient  Remains inpatient appropriate because:Hemodynamically unstable  Dispo: The patient is from: Home              Anticipated d/c is to: Home              Anticipated d/c date is: 3 days              Patient currently is not medically stable to d/c.   Subjective: still has abdominal discomfort, no nausea or vomiting.   Physical Exam:  General: Appear in mild distress, no Rash; Oral Mucosa Clear, moist. no Abnormal Neck Mass Or lumps, Conjunctiva normal  Cardiovascular: S1 and S2 Present, no Murmur, Respiratory: good respiratory effort, Bilateral Air entry present and CTA, no Crackles, no wheezes Abdomen: Bowel Sound present, Soft and mild tenderness Extremities: bilateral Pedal edema Neurology: alert and oriented to time, place, and person affect appropriate. no new focal deficit Gait not checked due to patient safety concerns  Vitals:  09/14/19 1500 09/14/19 1600 09/14/19 1700 09/14/19 1748  BP: 126/85 136/88 (!) 128/96 (!) 128/92  Pulse: 77 79 75 95  Resp: 10  11 16   Temp:    (!) 97.5 F (36.4 C)  TempSrc:    Oral  SpO2: 95% 95% 96% 97%  Weight:      Height:         Intake/Output Summary (Last 24 hours) at 09/14/2019 1858 Last data filed at 09/14/2019 1700 Gross per 24 hour  Intake 1441.87 ml  Output 850 ml  Net 591.87 ml   Filed Weights   09/12/19 0318 09/13/19 0500 09/14/19 0500  Weight: 114.4 kg 117.3 kg 118.2 kg   Data Reviewed: I have personally reviewed and interpreted daily labs, tele strips, imagings as discussed above. I reviewed all nursing notes, pharmacy notes, vitals, pertinent old records I have discussed plan of care as described above with RN and patient/family.  CBC: Recent Labs  Lab 09/11/19 1052 09/11/19 1052 09/12/19 0300 09/13/19 0655 09/13/19 1545 09/13/19 2100 09/14/19 0430  WBC 1.4*   < > 6.1 1.8* 1.5* 2.4* 2.7*  NEUTROABS 0.5*  --  1.9 1.2*  --   --  1.6*  HGB 10.4*   < > 10.3* 7.9* 7.3* 7.5* 7.3*  HCT 32.6*   < > 32.9* 24.5* 23.0* 23.6* 23.3*  MCV 95.6   < > 97.3 93.5 94.7 95.5 95.5  PLT 74*   < > 129* 59* 40* 42* 38*   < > = values in this interval not displayed.   Basic Metabolic Panel: Recent Labs  Lab 09/11/19 1052 09/11/19 2237 09/12/19 0300 09/13/19 0500 09/14/19 0430  NA 136 137 136 137 137  K 3.0* 3.9 3.5 3.0* 3.2*  CL 103 104 106 101 102  CO2 25 19* 19* 22 23  GLUCOSE 125* 151* 174* 232* 198*  BUN 25* 31* 32* 32* 34*  CREATININE 1.23 1.57* 1.67* 1.29* 1.21  CALCIUM 8.0* 7.7* 7.4* 7.6* 8.1*  MG  --   --  1.8 2.0 2.0  PHOS  --   --  5.5* 3.8 3.0    Studies: No results found.  Scheduled Meds:  acyclovir  400 mg Oral BID   allopurinol  300 mg Oral q morning - 10a   atorvastatin  40 mg Oral Daily   Chlorhexidine Gluconate Cloth  6 each Topical Daily   famotidine  20 mg Oral Daily   feeding supplement (KATE FARMS STANDARD 1.4)  325 mL Oral BID BM   hydrocortisone sod succinate (SOLU-CORTEF) inj  50 mg Intravenous TID   insulin aspart  0-15 Units Subcutaneous TID WC   insulin aspart  0-5 Units Subcutaneous QHS   levothyroxine  88 mcg Oral QAC breakfast   mouth rinse  15  mL Mouth Rinse BID   multivitamin with minerals  1 tablet Oral Daily   simethicone  80 mg Oral QID   sodium chloride flush  10-40 mL Intracatheter Q12H   vancomycin  125 mg Oral QID   Warfarin - Pharmacist Dosing Inpatient   Does not apply q1600   Continuous Infusions:  lactated ringers 50 mL/hr at 09/14/19 1741   PRN Meds: acetaminophen, lip balm, magic mouthwash w/lidocaine, morphine injection, ondansetron (ZOFRAN) IV  Time spent: 35 minutes  Author: Berle Mull, MD [facility] 09/14/2019 6:58 PM  To reach On-call, see care teams to locate the attending and reach out via [email or web address]. Between 7PM-7AM, please contact night-coverage If you still have difficulty reaching the attending  provider, please page the [facility] (Director on Call) for [facility] on amion for assistance.

## 2019-09-15 LAB — CBC WITH DIFFERENTIAL/PLATELET
Abs Immature Granulocytes: 0.5 10*3/uL — ABNORMAL HIGH (ref 0.00–0.07)
Basophils Absolute: 0 10*3/uL (ref 0.0–0.1)
Basophils Relative: 1 %
Eosinophils Absolute: 0 10*3/uL (ref 0.0–0.5)
Eosinophils Relative: 0 %
HCT: 23.7 % — ABNORMAL LOW (ref 39.0–52.0)
Hemoglobin: 7.6 g/dL — ABNORMAL LOW (ref 13.0–17.0)
Immature Granulocytes: 9 %
Lymphocytes Relative: 23 %
Lymphs Abs: 1.2 10*3/uL (ref 0.7–4.0)
MCH: 30.5 pg (ref 26.0–34.0)
MCHC: 32.1 g/dL (ref 30.0–36.0)
MCV: 95.2 fL (ref 80.0–100.0)
Monocytes Absolute: 0.3 10*3/uL (ref 0.1–1.0)
Monocytes Relative: 6 %
Neutro Abs: 3.3 10*3/uL (ref 1.7–7.7)
Neutrophils Relative %: 61 %
Platelets: 45 10*3/uL — ABNORMAL LOW (ref 150–400)
RBC: 2.49 MIL/uL — ABNORMAL LOW (ref 4.22–5.81)
RDW: 17.2 % — ABNORMAL HIGH (ref 11.5–15.5)
WBC: 5.4 10*3/uL (ref 4.0–10.5)
nRBC: 4.5 % — ABNORMAL HIGH (ref 0.0–0.2)

## 2019-09-15 LAB — COMPREHENSIVE METABOLIC PANEL
ALT: 28 U/L (ref 0–44)
AST: 23 U/L (ref 15–41)
Albumin: 2.3 g/dL — ABNORMAL LOW (ref 3.5–5.0)
Alkaline Phosphatase: 161 U/L — ABNORMAL HIGH (ref 38–126)
Anion gap: 9 (ref 5–15)
BUN: 38 mg/dL — ABNORMAL HIGH (ref 8–23)
CO2: 23 mmol/L (ref 22–32)
Calcium: 8.3 mg/dL — ABNORMAL LOW (ref 8.9–10.3)
Chloride: 104 mmol/L (ref 98–111)
Creatinine, Ser: 1.15 mg/dL (ref 0.61–1.24)
GFR calc Af Amer: >60 mL/min (ref >60)
GFR calc non Af Amer: >60 mL/min (ref >60)
Glucose, Bld: 203 mg/dL — ABNORMAL HIGH (ref 70–99)
Potassium: 3.6 mmol/L (ref 3.5–5.1)
Sodium: 136 mmol/L (ref 135–145)
Total Bilirubin: 1.6 mg/dL — ABNORMAL HIGH (ref 0.3–1.2)
Total Protein: 5 g/dL — ABNORMAL LOW (ref 6.5–8.1)

## 2019-09-15 LAB — GLUCOSE, CAPILLARY
Glucose-Capillary: 218 mg/dL — ABNORMAL HIGH (ref 70–99)
Glucose-Capillary: 199 mg/dL — ABNORMAL HIGH (ref 70–99)
Glucose-Capillary: 215 mg/dL — ABNORMAL HIGH (ref 70–99)
Glucose-Capillary: 226 mg/dL — ABNORMAL HIGH (ref 70–99)

## 2019-09-15 LAB — MAGNESIUM: Magnesium: 2.4 mg/dL (ref 1.7–2.4)

## 2019-09-15 LAB — PROTIME-INR
INR: 1.1 (ref 0.8–1.2)
Prothrombin Time: 14 seconds (ref 11.4–15.2)

## 2019-09-15 LAB — PHOSPHORUS: Phosphorus: 2.6 mg/dL (ref 2.5–4.6)

## 2019-09-15 MED ORDER — WARFARIN SODIUM 5 MG PO TABS
5.0000 mg | ORAL_TABLET | Freq: Once | ORAL | Status: AC
  Administered 2019-09-15: 5 mg via ORAL
  Filled 2019-09-15: qty 1

## 2019-09-15 MED ORDER — FUROSEMIDE 10 MG/ML IJ SOLN
40.0000 mg | Freq: Once | INTRAMUSCULAR | Status: AC
  Administered 2019-09-15: 40 mg via INTRAVENOUS
  Filled 2019-09-15: qty 4

## 2019-09-15 MED ORDER — ALBUMIN HUMAN 25 % IV SOLN
12.5000 g | Freq: Once | INTRAVENOUS | Status: AC
  Administered 2019-09-15: 12.5 g via INTRAVENOUS
  Filled 2019-09-15: qty 50

## 2019-09-15 MED ORDER — HYDROCORTISONE NA SUCCINATE PF 100 MG IJ SOLR
50.0000 mg | Freq: Two times a day (BID) | INTRAMUSCULAR | Status: DC
  Administered 2019-09-15 – 2019-09-16 (×2): 50 mg via INTRAVENOUS
  Filled 2019-09-15 (×2): qty 2

## 2019-09-15 NOTE — Progress Notes (Signed)
ANTICOAGULATION CONSULT NOTE  Pharmacy Consult for warfarin Indication: atrial fibrillation  No Known Allergies  Patient Measurements: Height: 5\' 10"  (177.8 cm) Weight: 121.3 kg (267 lb 6.7 oz) IBW/kg (Calculated) : 73  Vital Signs: Temp: 97.4 F (36.3 C) (09/12 0602) Temp Source: Oral (09/12 0602) BP: 122/73 (09/12 0602) Pulse Rate: 69 (09/12 0602)  Labs: Recent Labs    09/13/19 0500 09/13/19 0655 09/13/19 2100 09/13/19 2100 09/14/19 0430 09/15/19 0400  HGB  --    < > 7.5*   < > 7.3* 7.6*  HCT  --    < > 23.6*  --  23.3* 23.7*  PLT  --    < > 42*  --  38* 45*  LABPROT 19.8*  --   --   --  15.3* 14.0  INR 1.8*  --   --   --  1.3* 1.1  CREATININE 1.29*  --   --   --  1.21 1.15   < > = values in this interval not displayed.    Estimated Creatinine Clearance: 76.9 mL/min (by C-G formula based on SCr of 1.15 mg/dL).   Medications: Warfarin 2.5 mg and 5 mg alternating every other day PTA INR on admission: 8.7  Assessment: Pt is a 68 yoM on warfarin PTA for history of atrial fibrillation. PMH significant for metastatic colon cancer.  Pt admitted with mutifactorial shock (septic, hypovolemic), currently on PO vancomycin for c.diff.    Significant Events: -9/8: INR 8.7, metronidazole dose given -9/9: INR >10, no bleeding. Vitamin K 2.5 mg IV, 10 mg IV given -9/10: INR 1.8 s/p vitamin K -9/11: Resume warfarin dosing  Today, 09/15/19  INR = 1.1 is subtherapeutic as expected s/p large  vitamin K on 9/9, warfarin 2.5 mg given yesterday  CBC: Hgb (7.6), Plt (45) both low but relatively stable; no evidence of bleeding. Pt received chemotherapy on 8/31, likely contributing to low counts  Diet: Heart healthy, carb modified. Poor PO intake  No significant DDI  Goal of Therapy:  INR 2-3 Monitor platelets by anticoagulation protocol: Yes   Plan:   No bridge at this time per MD  Warfarin 5 mg PO once today  INR daily  Follow for signs/symptoms of  bleeding  Eudelia Bunch, Pharm.D 09/15/2019 9:35 AM

## 2019-09-15 NOTE — Progress Notes (Signed)
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  Currently plan is treat diarrhea.  Assessment and Plan: Multifactorial shock-septic and hypovolemic in the setting of diarrhea exacerbated by adrenal insufficiency:  Chest x-ray clear, UA clear, no focal signs on exam.  Having diarrhea so presumed intra-abdominal source, C Diff PCR positive, toxin negative. D/C Vancomycin, cefepime as cultures negative at 48 hrs p.o. vancomycin given worsened diarrhea and lack of other source at this time norepinephrine, vaso weaned off  IV hydrocortisone 50 mg every 12 h.  AKI:  Likely ATN in the setting of hypovolemia and shock. Monitor urine output  Febrile neutropenia:  In the setting of recent chemotherapy. Received Neulasta a few days ago.  No longer neutropenic Antibiotics as above.  Adrenal insufficiency:  Secondary to pituitary tumor resection in past.  At home takes 20 mg and 10 mg in the morning and afternoon respectively of hydrocortisone. Stress dose steroids as above  Hypothyroidism:  Continue Synthroid  Ischemic cardiomyopathy: Acute on chronic combined systolic and diastolic CHF EF 81% in 1914. Bedside ultrasound on admission with what appears to be pretty normal EF, collapsible small IVC with admittedly poor windows. Hold home carvedilol, Lasix given hypotension Formal TTE EF 40-45% similar to prior We will provide IV albumin followed by IV Lasix to treat volume overload.  Atrial fibrillation with RVR: A. fib is chronic.  On warfarin at home for stroke prophylaxis, INR elevated greater than 8 on admission.  No active signs or symptoms of  bleeding.  Hemoglobin stable/at baseline on initial check.  Rates improving as shock improves Hold warfarin, daily INR INR 1.8, resume warfarin tomorrow, no need to bridge Hold carvedilol given hypotension  Anemia without evidence of bleeding Transfuse for hgb < 7 Holding ac   Mild thrombocytopenia improving,   Hypothyroidism Cont synthroid   Obesity: Body mass index is 38.37 kg/m.    Nutrition Problem: Increased nutrient needs Etiology: acute illness, catabolic illness, cancer and cancer related treatments Interventions: Interventions: MVI, Magic cup, Other (Comment) [employer] 1.4 po)  Diet: cardiac diet DVT Prophylaxis: Place and maintain sequential compression device Start: 09/14/19 0930 Place TED hose Start: 09/14/19 0930  Advance goals of care discussion: Full code  Family Communication: no family was present at bedside, at the time of interview.   Disposition:  Status is: Inpatient  Remains inpatient appropriate because:Hemodynamically unstable  Dispo: The patient is from: Home              Anticipated d/c is to: Home              Anticipated d/c date is: 3 days              Patient currently is not medically stable to d/c.   Subjective: Feeling better.  Has a rectal tube.  No fever no chills.  Significantly volume overloaded.  Physical Exam:  General: Appear in mild distress, no Rash; Oral Mucosa Clear, moist. no Abnormal Neck Mass Or lumps, Conjunctiva normal  Cardiovascular: S1 and S2 Present, no Murmur, Respiratory: good respiratory effort, Bilateral Air entry present and CTA, no Crackles, no wheezes Abdomen: Bowel Sound present, Soft and mild tenderness Extremities: bilateral Pedal  edema Neurology: alert and oriented to time, place, and person affect appropriate. no new focal deficit Gait not checked due to patient safety concerns  Vitals:   09/14/19 2130 09/15/19 0602 09/15/19 1850 09/15/19 2058  BP: 97/80 122/73 130/84 128/80  Pulse: 63  69 74 65  Resp: 20 20 18 20   Temp: 98.1 F (36.7 C) (!) 97.4 F (36.3 C) 97.8 F (36.6 C) 97.8 F (36.6 C)  TempSrc: Oral Oral Oral   SpO2: 98% 99% 96% 97%  Weight:  121.3 kg    Height:        Intake/Output Summary (Last 24 hours) at 09/15/2019 2110 Last data filed at 09/15/2019 1851 Gross per 24 hour  Intake 1233.08 ml  Output 1850 ml  Net -616.92 ml   Filed Weights   09/13/19 0500 09/14/19 0500 09/15/19 0602  Weight: 117.3 kg 118.2 kg 121.3 kg   Data Reviewed: I have personally reviewed and interpreted daily labs, tele strips, imagings as discussed above. I reviewed all nursing notes, pharmacy notes, vitals, pertinent old records I have discussed plan of care as described above with RN and patient/family.  CBC: Recent Labs  Lab 09/11/19 1052 09/11/19 1052 09/12/19 0300 09/12/19 0300 09/13/19 0655 09/13/19 1545 09/13/19 2100 09/14/19 0430 09/15/19 0400  WBC 1.4*   < > 6.1   < > 1.8* 1.5* 2.4* 2.7* 5.4  NEUTROABS 0.5*  --  1.9  --  1.2*  --   --  1.6* 3.3  HGB 10.4*   < > 10.3*   < > 7.9* 7.3* 7.5* 7.3* 7.6*  HCT 32.6*   < > 32.9*   < > 24.5* 23.0* 23.6* 23.3* 23.7*  MCV 95.6   < > 97.3   < > 93.5 94.7 95.5 95.5 95.2  PLT 74*   < > 129*   < > 59* 40* 42* 38* 45*   < > = values in this interval not displayed.   Basic Metabolic Panel: Recent Labs  Lab 09/11/19 2237 09/12/19 0300 09/13/19 0500 09/14/19 0430 09/15/19 0400  NA 137 136 137 137 136  K 3.9 3.5 3.0* 3.2* 3.6  CL 104 106 101 102 104  CO2 19* 19* 22 23 23   GLUCOSE 151* 174* 232* 198* 203*  BUN 31* 32* 32* 34* 38*  CREATININE 1.57* 1.67* 1.29* 1.21 1.15  CALCIUM 7.7* 7.4* 7.6* 8.1* 8.3*  MG  --  1.8 2.0 2.0 2.4  PHOS  --  5.5* 3.8 3.0 2.6    Studies: No results found.  Scheduled Meds:  acyclovir  400 mg Oral BID   allopurinol  300 mg Oral q morning - 10a   atorvastatin  40 mg Oral Daily   Chlorhexidine Gluconate Cloth  6 each Topical Daily   famotidine  20 mg Oral Daily   feeding  supplement (KATE FARMS STANDARD 1.4)  325 mL Oral BID BM   hydrocortisone sod succinate (SOLU-CORTEF) inj  50 mg Intravenous Q12H   insulin aspart  0-15 Units Subcutaneous TID WC   insulin aspart  0-5 Units Subcutaneous QHS   levothyroxine  88 mcg Oral QAC breakfast   mouth rinse  15 mL Mouth Rinse BID   multivitamin with minerals  1 tablet Oral Daily   simethicone  80 mg Oral QID   sodium chloride flush  10-40 mL Intracatheter Q12H   vancomycin  125 mg Oral QID   Warfarin - Pharmacist Dosing Inpatient   Does not apply q1600   Continuous Infusions:  PRN  Meds: acetaminophen, lip balm, magic mouthwash w/lidocaine, morphine injection, ondansetron (ZOFRAN) IV  Time spent: 35 minutes  Author: Berle Mull, MD [facility] 09/15/2019 9:10 PM  To reach On-call, see care teams to locate the attending and reach out via [email or web address]. Between 7PM-7AM, please contact night-coverage If you still have difficulty reaching the attending provider, please page the [facility] (Director on Call) for [facility] on amion for assistance.

## 2019-09-16 LAB — CBC WITH DIFFERENTIAL/PLATELET
Abs Immature Granulocytes: 0.89 10*3/uL — ABNORMAL HIGH (ref 0.00–0.07)
Basophils Absolute: 0.1 10*3/uL (ref 0.0–0.1)
Basophils Relative: 1 %
Eosinophils Absolute: 0 10*3/uL (ref 0.0–0.5)
Eosinophils Relative: 0 %
HCT: 24.3 % — ABNORMAL LOW (ref 39.0–52.0)
Hemoglobin: 7.7 g/dL — ABNORMAL LOW (ref 13.0–17.0)
Immature Granulocytes: 11 %
Lymphocytes Relative: 18 %
Lymphs Abs: 1.5 10*3/uL (ref 0.7–4.0)
MCH: 30.1 pg (ref 26.0–34.0)
MCHC: 31.7 g/dL (ref 30.0–36.0)
MCV: 94.9 fL (ref 80.0–100.0)
Monocytes Absolute: 0.4 10*3/uL (ref 0.1–1.0)
Monocytes Relative: 5 %
Neutro Abs: 5.6 10*3/uL (ref 1.7–7.7)
Neutrophils Relative %: 65 %
Platelets: 64 10*3/uL — ABNORMAL LOW (ref 150–400)
RBC: 2.56 MIL/uL — ABNORMAL LOW (ref 4.22–5.81)
RDW: 17.3 % — ABNORMAL HIGH (ref 11.5–15.5)
WBC: 8.5 10*3/uL (ref 4.0–10.5)
nRBC: 6.1 % — ABNORMAL HIGH (ref 0.0–0.2)

## 2019-09-16 LAB — COMPREHENSIVE METABOLIC PANEL
ALT: 29 U/L (ref 0–44)
AST: 25 U/L (ref 15–41)
Albumin: 2.3 g/dL — ABNORMAL LOW (ref 3.5–5.0)
Alkaline Phosphatase: 199 U/L — ABNORMAL HIGH (ref 38–126)
Anion gap: 9 (ref 5–15)
BUN: 44 mg/dL — ABNORMAL HIGH (ref 8–23)
CO2: 25 mmol/L (ref 22–32)
Calcium: 8.2 mg/dL — ABNORMAL LOW (ref 8.9–10.3)
Chloride: 103 mmol/L (ref 98–111)
Creatinine, Ser: 1.15 mg/dL (ref 0.61–1.24)
GFR calc Af Amer: >60 mL/min (ref >60)
GFR calc non Af Amer: >60 mL/min (ref >60)
Glucose, Bld: 260 mg/dL — ABNORMAL HIGH (ref 70–99)
Potassium: 3.3 mmol/L — ABNORMAL LOW (ref 3.5–5.1)
Sodium: 137 mmol/L (ref 135–145)
Total Bilirubin: 1.6 mg/dL — ABNORMAL HIGH (ref 0.3–1.2)
Total Protein: 4.6 g/dL — ABNORMAL LOW (ref 6.5–8.1)

## 2019-09-16 LAB — PROTIME-INR
INR: 1.2 (ref 0.8–1.2)
Prothrombin Time: 14.7 seconds (ref 11.4–15.2)

## 2019-09-16 LAB — GLUCOSE, CAPILLARY
Glucose-Capillary: 189 mg/dL — ABNORMAL HIGH (ref 70–99)
Glucose-Capillary: 266 mg/dL — ABNORMAL HIGH (ref 70–99)
Glucose-Capillary: 244 mg/dL — ABNORMAL HIGH (ref 70–99)
Glucose-Capillary: 229 mg/dL — ABNORMAL HIGH (ref 70–99)
Glucose-Capillary: 169 mg/dL — ABNORMAL HIGH (ref 70–99)

## 2019-09-16 LAB — MAGNESIUM: Magnesium: 2.2 mg/dL (ref 1.7–2.4)

## 2019-09-16 LAB — PHOSPHORUS: Phosphorus: 2.8 mg/dL (ref 2.5–4.6)

## 2019-09-16 LAB — PREPARE RBC (CROSSMATCH)

## 2019-09-16 MED ORDER — RISAQUAD PO CAPS
1.0000 | ORAL_CAPSULE | Freq: Every day | ORAL | Status: DC
  Administered 2019-09-16 – 2019-09-18 (×3): 1 via ORAL
  Filled 2019-09-16 (×3): qty 1

## 2019-09-16 MED ORDER — FUROSEMIDE 10 MG/ML IJ SOLN
20.0000 mg | Freq: Two times a day (BID) | INTRAMUSCULAR | Status: DC
  Administered 2019-09-16 – 2019-09-19 (×6): 20 mg via INTRAVENOUS
  Filled 2019-09-16 (×7): qty 2

## 2019-09-16 MED ORDER — HYDROCORTISONE NA SUCCINATE PF 100 MG IJ SOLR: 50.0000 mg | Freq: Every day | INTRAMUSCULAR | Status: DC

## 2019-09-16 MED ORDER — FUROSEMIDE 10 MG/ML IJ SOLN: 20.0000 mg | Freq: Once | INTRAMUSCULAR | Status: DC

## 2019-09-16 MED ORDER — MELATONIN 3 MG PO TABS
6.0000 mg | ORAL_TABLET | Freq: Every evening | ORAL | Status: DC | PRN
  Administered 2019-09-16: 6 mg via ORAL
  Filled 2019-09-16: qty 2

## 2019-09-16 MED ORDER — HYDROCORTISONE 20 MG PO TABS
20.0000 mg | ORAL_TABLET | Freq: Every evening | ORAL | Status: DC
  Administered 2019-09-17: 20 mg via ORAL
  Filled 2019-09-16 (×2): qty 1

## 2019-09-16 MED ORDER — WARFARIN SODIUM 5 MG PO TABS
5.0000 mg | ORAL_TABLET | Freq: Once | ORAL | Status: AC
  Administered 2019-09-16: 5 mg via ORAL
  Filled 2019-09-16: qty 1

## 2019-09-16 MED ORDER — ENOXAPARIN SODIUM 120 MG/0.8ML ~~LOC~~ SOLN
1.0000 mg/kg | Freq: Two times a day (BID) | SUBCUTANEOUS | Status: DC
  Administered 2019-09-16 – 2019-09-18 (×6): 120 mg via SUBCUTANEOUS
  Filled 2019-09-16 (×7): qty 0.8

## 2019-09-16 MED ORDER — SODIUM CHLORIDE 0.9% IV SOLUTION: Freq: Once | INTRAVENOUS | Status: AC

## 2019-09-16 NOTE — Care Management Important Message (Signed)
Important Message  Patient Details IM Letter given to the Patient Name: Zachary Willis MRN: [id number] Date of Birth: [birth year]   Medicare Important Message Given:  Yes     Kerin Salen 09/16/2019, 11:48 AM

## 2019-09-16 NOTE — Progress Notes (Signed)
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    Follow Up Recommendations  Home health PT;Supervision/Assistance - 24 hour     Equipment Recommendations  None recommended by PT    Recommendations for Other Services       Precautions / Restrictions Precautions Precautions: Fall Precaution Comments: rectal pouch    Mobility  Bed Mobility Overal bed mobility: Needs Assistance Bed Mobility: Supine to Sit     Supine to sit: Min guard     General bed mobility comments: min/guard for lines, provided a hand for pt to pull trunk upright  Transfers Overall transfer level: Needs assistance Equipment used: Rolling walker (2 wheeled) Transfers: Sit to/from Stand Sit to Stand: Min guard         General transfer comment: verbal cues for hand placement  Ambulation/Gait Ambulation/Gait assistance: Min guard Gait Distance (Feet): 120 Feet Assistive device: Rolling walker (2 wheeled) Gait Pattern/deviations: Step-through pattern;Decreased stride length;Trunk flexed     General Gait Details: verbal cues for RW positioning, posture; followed with recliner for safety; distance to tolerance   Stairs             Wheelchair Mobility    Modified Rankin (Stroke Patients Only)       Balance                                             Cognition Arousal/Alertness: Awake/alert Behavior During Therapy: WFL for tasks assessed/performed Overall Cognitive Status: Within Functional Limits for tasks assessed                                        Exercises      General Comments        Pertinent Vitals/Pain Pain Assessment: No/denies pain    Home Living                      Prior Function            PT Goals (current goals can now be found in the care plan section) Progress towards PT goals: Progressing toward goals    Frequency    Min 3X/week      PT Plan Current plan remains appropriate    Co-evaluation              AM-PAC PT "6 Clicks" Mobility   Outcome Measure  Help needed turning from your back to your side while in a flat bed without using bedrails?: None Help needed moving from lying on your back to sitting on the side of a flat bed without using bedrails?: A Little Help needed moving to and from a bed to a chair (including a wheelchair)?: A Little Help needed  standing up from a chair using your arms (e.g., wheelchair or bedside chair)?: A Little Help needed to walk in hospital room?: A Little Help needed climbing 3-5 steps with a railing? : A Lot 6 Click Score: 18    End of Session Equipment Utilized During Treatment: Gait belt Activity Tolerance: Patient tolerated treatment well Patient left: with call bell/phone within reach;with family/visitor present;in bed;with bed alarm set Nurse Communication: Mobility status PT Visit Diagnosis: Difficulty in walking, not elsewhere classified (R26.2)     Time: 5053-9767 PT Time Calculation (min) (ACUTE ONLY): 22 min  Charges:  $Gait Training: 8-22 mins                     Arlyce Dice, DPT Acute Rehabilitation Services Pager: [phone] Office: [place] E 09/16/2019, 4:41 PM

## 2019-09-16 NOTE — Plan of Care (Signed)

## 2019-09-16 NOTE — Progress Notes (Addendum)
Inpatient Diabetes Program Recommendations  AACE/ADA: New Consensus Statement on Inpatient Glycemic Control (2015)  Target Ranges:  Prepandial:   less than 140 mg/dL      Peak postprandial:   less than 180 mg/dL (1-2 hours)      Critically ill patients:  140 - 180 mg/dL   Results for JASTIN, FORE (MRN [id number]) as of 09/16/2019 09:55  Ref. Range 09/15/2019 08:12 09/15/2019 11:48 09/15/2019 16:30 09/15/2019 20:56  Glucose-Capillary Latest Ref Range: 70 - 99 mg/dL 218 (H)  5 units NOVOLOG  199 (H)  3 units NOVOLOG  215 (H)  5 units NOVOLOG  226 (H)  2 units NOVOLOG    Results for DONIVAN, THAMMAVONG (MRN [id number]) as of 09/16/2019 09:55  Ref. Range 09/16/2019 08:26  Glucose-Capillary Latest Ref Range: 70 - 99 mg/dL 266 (H)  6 units NOVOLOG    Results for RAKESH, DUTKO (MRN [id number]) as of 09/16/2019 09:55  Ref. 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  CBGs >200.  Is this a new diagnosis of Diabetes for this patient?  If so, please address with patient and then the diabetes team will also speak with pt further about home care.  May also consider:  Start Levemir 12 units daily (0.1 units/kg)  Will pt need oral Diabetes med for home once Solucortef stopped?  Perhaps DPP-4 inhibitor like Januvia or Tradjenta?   Addendum 12:45pm--Went by to see pt this afternoon.  Discussed with pt current CBGs, A1c of 8.9%,  etc.  Discussed with pt that given he takes Decadron at home on Chemo days and also has to take Cortef at home for his adrenal insufficiency that these 2 steroids have likely caused issues with his Hyperglycemia and elevated A1c.  Explained to pt why we are checking CBGs and giving him insulin in the hospital.  Also discussed with pt that the MD may decide to send pt home on medication to help control his CBGs better at home.  Pt gave me permission to call his wife and share all the above info with her as well.  I called wife at home but no one answered.  Left voicemail on answering machine asking her to call me back.    --Will follow patient during hospitalization--  Wyn Quaker RN, MSN, CDE Diabetes Coordinator Inpatient Glycemic Control Team Team Pager: [phone] (8a-5p)

## 2019-09-16 NOTE — Progress Notes (Signed)
Mr. Hazelrigg is now out of the ICU.  He is up on 4 E.  He says his diarrhea is better.  He is being treated for C. difficile.  His white cell count is 8.5.  Platelet count is starting to come up at 64,000.  His hemoglobin is only 7.7.  I really think that he will benefit from 1 unit of blood.  Given that he has diabetes and is on chemotherapy, I really do not think that his hemoglobin is going to come up all the quickly by itself.  I talked to him about the transfusion.  I explained why I thought it would help him.  I went over the risks.  He agrees to have the transfusion.  I think that he needs to be on Lovenox until the Coumadin gets therapeutic.  He has a paroxysmal atrial fibrillation.  His INR is only 1.2.  He probably is quite weak.  He probably needs to have some physical therapy.  It is hard to know how much she really is eating.  His blood sugars are on the higher side.  When I listen to his abdomen, his bowel sounds are high-pitched.  I do not think that it would be a bad idea to get an abdominal x-ray on him.  On his PE;  The vital signs are stable.  His lugs are clear.a  Cardiac exam is c/w atrial fibrillation.  His abdomen is slightly distended.  He had high pitched BS. No guarding.    Hopefully, the C. Diff is resolving.  Thankfully, he is not neutropenic.  I do appreciate all of the help from the staff on 4E!!  Lattie Haw, MD  Darlyn Chamber 32:17

## 2019-09-16 NOTE — Progress Notes (Signed)
[place] for warfarin Indication: atrial fibrillation  No Known Allergies  Patient Measurements: Height: 5\' 10"  (177.8 cm) Weight: 120.2 kg (264 lb 15.9 oz) IBW/kg (Calculated) : 73  Vital Signs: Temp: 98 F (36.7 C) (09/13 1019) Temp Source: Oral (09/13 1019) BP: 103/74 (09/13 1019) Pulse Rate: 72 (09/13 1019)  Labs: Recent Labs    09/14/19 0430 09/14/19 0430 09/15/19 0400 09/16/19 0352  HGB 7.3*   < > 7.6* 7.7*  HCT 23.3*  --  23.7* 24.3*  PLT 38*  --  45* 64*  LABPROT 15.3*  --  14.0 14.7  INR 1.3*  --  1.1 1.2  CREATININE 1.21  --  1.15 1.15   < > = values in this interval not displayed.    Estimated Creatinine Clearance: 76.6 mL/min (by C-G formula based on SCr of 1.15 mg/dL).   Medications: Warfarin 2.5 mg and 5 mg alternating every other day PTA INR on admission: 8.7  Assessment: Pt is a 79 yoM on warfarin PTA for history of atrial fibrillation. PMH significant for metastatic colon cancer.  Pt admitted with mutifactorial shock (septic, hypovolemic), currently on PO vancomycin for c.diff.    Significant Events: -9/8: INR 8.7, metronidazole dose given -9/9: INR >10, no bleeding. Vitamin K 2.5 mg IV, 10 mg IV given -9/10: INR 1.8 s/p vitamin K -9/11: Resume warfarin dosing -9/13: start Lovenox bridge per Onc  Today, 09/16/19  INR remains subtherapeutic as expected following large IV vitamin K dose on 9/9  CBC: Hgb, Plt both low but relatively stable; no evidence of bleeding. Pt received chemotherapy on 8/31, likely contributing to low counts  Diet: Heart healthy, carb modified. Intake improved to ~50%  No significant DDI  Onc bridging with Lovenox 1 mg/kg q12 hr  Goal of Therapy:  INR 2-3 Monitor platelets by anticoagulation protocol: Yes   Plan:   Continue Lovenox 120 mg SQ q12 per Onc while INR < 2.0; dosing appropriate  Repeat warfarin 5 mg PO tonight  INR daily  Follow for signs/symptoms of  bleeding  Reuel Boom, PharmD, BCPS [phone] 09/16/2019, 11:36 AM

## 2019-09-16 NOTE — Progress Notes (Signed)
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  Currently plan is treat diarrhea.  Assessment and Plan: Multifactorial shock-septic and hypovolemic in the setting of diarrhea exacerbated by adrenal insufficiency:  Chest x-ray clear, UA clear, no focal signs on exam.  Having diarrhea so presumed intra-abdominal source, C Diff PCR positive, toxin negative. D/C Vancomycin, cefepime as cultures negative at 48 hrs p.o. vancomycin given worsened diarrhea and lack of other source at this time norepinephrine, vaso weaned off  IV hydrocortisone 50 mg in the morning, 20 mg in the evening.  AKI:  Likely ATN in the setting of hypovolemia and shock. Monitor urine output.  Currently receiving IV Lasix.  Febrile neutropenia:  In the setting of recent chemotherapy. Received Neulasta a few days ago.  No longer neutropenic Antibiotics as above.  Adrenal insufficiency:  Secondary to pituitary tumor resection in past.  At home takes 20 mg and 10 mg in the morning and afternoon respectively of hydrocortisone. Stress dose steroids as above  Hypothyroidism:  Continue Synthroid  Ischemic cardiomyopathy: Acute on chronic combined systolic and diastolic CHF EF 54% in 6270. Bedside ultrasound on admission with what appears to be pretty normal EF, collapsible small IVC with admittedly poor windows.  Hold home carvedilol, Formal TTE EF 40-45% similar to prior Continue IV Lasix.  Atrial fibrillation with RVR: A. fib is chronic.  On warfarin at home for stroke prophylaxis, INR elevated greater than 8 on admission.  No active signs or symptoms of bleeding.   Hemoglobin stable/at baseline on initial check.  Rates improving as shock improves Hold carvedilol given hypotension Hematology receiving anticoagulation.  Will monitor.  Anemia without evidence of bleeding Transfuse for hgb < 7 Holding ac   Mild thrombocytopenia improving,   Hypothyroidism Cont synthroid   Obesity: Body mass index is 38.02 kg/m.    Nutrition Problem: Increased nutrient needs Etiology: acute illness, catabolic illness, cancer and cancer related treatments Interventions: Interventions: MVI, Magic cup, Other (Comment) [employer] 1.4 po)  Diet: cardiac diet DVT Prophylaxis: Place and maintain sequential compression device Start: 09/14/19 0930 Place TED hose Start: 09/14/19 0930  Advance goals of care discussion: Full code  Family Communication: no family was present at bedside, at the time of interview.   Disposition:  Status is: Inpatient  Remains inpatient appropriate because:Hemodynamically unstable  Dispo: The patient is from: Home              Anticipated d/c is to: Home              Anticipated d/c date is: 3 days              Patient currently is not medically stable to d/c.   Subjective: No nausea no vomiting.  No fever no chills.  Physical Exam:  General: Appear in mild distress, no Rash; Oral Mucosa Clear, moist. no Abnormal Neck Mass Or lumps, Conjunctiva normal  Cardiovascular: S1 and S2 Present, no Murmur, Respiratory: good respiratory effort, Bilateral Air entry present and CTA, no Crackles, no wheezes Abdomen: Bowel Sound present, Soft and mild tenderness Extremities: bilateral Pedal edema Neurology: alert and oriented to time, place, and person affect appropriate. no new focal deficit  Gait not checked due to patient safety concerns  Vitals:   09/16/19 1003 09/16/19 1019 09/16/19 1235 09/16/19 1245  BP: 121/73 103/74 119/82 123/80  Pulse: 78 72 67 76  Resp: 18 18 18 20   Temp: 97.7 F (36.5 C) 98 F (36.7 C) 98 F (36.7  C) 98.7 F (37.1 C)  TempSrc: Oral Oral Oral Oral  SpO2: 98% 98% 96% 96%  Weight:      Height:        Intake/Output Summary (Last 24 hours) at 09/16/2019 2029 Last data filed at 09/16/2019 1839 Gross per 24 hour  Intake 753.67 ml  Output 2875 ml  Net -2121.33 ml   Filed Weights   09/14/19 0500 09/15/19 0602 09/16/19 0500  Weight: 118.2 kg 121.3 kg 120.2 kg   Data Reviewed: I have personally reviewed and interpreted daily labs, tele strips, imagings as discussed above. I reviewed all nursing notes, pharmacy notes, vitals, pertinent old records I have discussed plan of care as described above with RN and patient/family.  CBC: Recent Labs  Lab 09/12/19 0300 09/12/19 0300 09/13/19 0655 09/13/19 0655 09/13/19 1545 09/13/19 2100 09/14/19 0430 09/15/19 0400 09/16/19 0352  WBC 6.1   < > 1.8*   < > 1.5* 2.4* 2.7* 5.4 8.5  NEUTROABS 1.9  --  1.2*  --   --   --  1.6* 3.3 5.6  HGB 10.3*   < > 7.9*   < > 7.3* 7.5* 7.3* 7.6* 7.7*  HCT 32.9*   < > 24.5*   < > 23.0* 23.6* 23.3* 23.7* 24.3*  MCV 97.3   < > 93.5   < > 94.7 95.5 95.5 95.2 94.9  PLT 129*   < > 59*   < > 40* 42* 38* 45* 64*   < > = values in this interval not displayed.   Basic Metabolic Panel: Recent Labs  Lab 09/12/19 0300 09/13/19 0500 09/14/19 0430 09/15/19 0400 09/16/19 0352  NA 136 137 137 136 137  K 3.5 3.0* 3.2* 3.6 3.3*  CL 106 101 102 104 103  CO2 19* 22 23 23 25   GLUCOSE 174* 232* 198* 203* 260*  BUN 32* 32* 34* 38* 44*  CREATININE 1.67* 1.29* 1.21 1.15 1.15  CALCIUM 7.4* 7.6* 8.1* 8.3* 8.2*  MG 1.8 2.0 2.0 2.4 2.2  PHOS 5.5* 3.8 3.0 2.6 2.8    Studies: No results found.  Scheduled Meds: . acidophilus  1 capsule Oral Daily  . acyclovir  400 mg Oral BID  . allopurinol  300 mg Oral q morning - 10a  . atorvastatin  40 mg Oral Daily  . Chlorhexidine Gluconate Cloth  6 each Topical Daily  . enoxaparin (LOVENOX) injection  1 mg/kg Subcutaneous Q12H  . famotidine  20 mg Oral Daily  . feeding  supplement ([employer] STANDARD 1.4)  325 mL Oral BID BM  . furosemide  20 mg Intravenous BID  . [START ON 09/17/2019] hydrocortisone sod succinate (SOLU-CORTEF) inj  50 mg Intravenous Daily  . insulin aspart  0-15 Units Subcutaneous TID WC  . insulin aspart  0-5 Units Subcutaneous QHS  . levothyroxine  88 mcg Oral QAC breakfast  . mouth rinse  15 mL Mouth Rinse BID  . multivitamin with minerals  1 tablet Oral Daily  . simethicone  80 mg Oral QID  . sodium chloride flush  10-40 mL Intracatheter Q12H  . vancomycin  125 mg Oral QID  . Warfarin - Pharmacist Dosing Inpatient   Does not apply  q1600   Continuous Infusions:  PRN Meds: acetaminophen, lip balm, magic mouthwash w/lidocaine, morphine injection, ondansetron (ZOFRAN) IV  Time spent: 35 minutes  Author: Berle Mull, MD [facility] 09/16/2019 8:29 PM  To reach On-call, see care teams to locate the attending and reach out via [email or web address]. Between 7PM-7AM, please contact night-coverage If you still have difficulty reaching the attending provider, please page the [facility] (Director on Call) for [facility] on amion for assistance.

## 2019-09-17 LAB — BPAM RBC
Blood Product Expiration Date: 202110072359
ISSUE DATE / TIME: 202109130958
Unit Type and Rh: 5100

## 2019-09-17 LAB — GLUCOSE, CAPILLARY
Glucose-Capillary: 179 mg/dL — ABNORMAL HIGH (ref 70–99)
Glucose-Capillary: 172 mg/dL — ABNORMAL HIGH (ref 70–99)
Glucose-Capillary: 148 mg/dL — ABNORMAL HIGH (ref 70–99)
Glucose-Capillary: 231 mg/dL — ABNORMAL HIGH (ref 70–99)

## 2019-09-17 LAB — TYPE AND SCREEN
ABO/RH(D): O POS
Antibody Screen: NEGATIVE
Unit division: 0

## 2019-09-17 LAB — CBC
HCT: 28.5 % — ABNORMAL LOW (ref 39.0–52.0)
Hemoglobin: 9.1 g/dL — ABNORMAL LOW (ref 13.0–17.0)
MCH: 30.5 pg (ref 26.0–34.0)
MCHC: 31.9 g/dL (ref 30.0–36.0)
MCV: 95.6 fL (ref 80.0–100.0)
Platelets: 85 10*3/uL — ABNORMAL LOW (ref 150–400)
RBC: 2.98 MIL/uL — ABNORMAL LOW (ref 4.22–5.81)
RDW: 17.9 % — ABNORMAL HIGH (ref 11.5–15.5)
WBC: 8.9 10*3/uL (ref 4.0–10.5)
nRBC: 7.4 % — ABNORMAL HIGH (ref 0.0–0.2)

## 2019-09-17 LAB — MAGNESIUM: Magnesium: 2 mg/dL (ref 1.7–2.4)

## 2019-09-17 LAB — PROTIME-INR
INR: 1.4 — ABNORMAL HIGH (ref 0.8–1.2)
Prothrombin Time: 16.4 seconds — ABNORMAL HIGH (ref 11.4–15.2)

## 2019-09-17 MED ORDER — HYDROCORTISONE 20 MG PO TABS
40.0000 mg | ORAL_TABLET | Freq: Once | ORAL | Status: AC
  Administered 2019-09-17: 40 mg via ORAL
  Filled 2019-09-17: qty 2

## 2019-09-17 MED ORDER — WARFARIN SODIUM 3 MG PO TABS
3.0000 mg | ORAL_TABLET | Freq: Once | ORAL | Status: AC
  Administered 2019-09-17: 3 mg via ORAL
  Filled 2019-09-17: qty 1

## 2019-09-17 NOTE — Progress Notes (Signed)
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  Currently plan is treat diarrhea.  Assessment and Plan: Multifactorial shock Septic and hypovolemic, POA in the setting of diarrhea exacerbated by adrenal insufficiency:  Chest x-ray clear, UA clear, no focal signs on exam.  Having diarrhea so presumed intra-abdominal source, C Diff PCR positive, toxin negative. Initially was on IV vancomycin, cefepime, currently discontinued as cultures negative at 48 hrs. Since patient continues to have diarrhea, started on p.o. vancomycin patient was on norepinephrine, vaso, currently off  Started on IV hydrocortisone for stress dose Currently tapering Cortef p.o.  AKI:  Likely ATN in the setting of hypovolemia and shock. Monitor urine output.  Currently receiving IV Lasix.  Febrile neutropenia:  In the setting of recent chemotherapy. Received Neulasta a few days ago.  No longer neutropenic Antibiotics as above.  Adrenal insufficiency:  Secondary to pituitary tumor resection in past.  At home takes 20 mg and 10 mg in the morning and afternoon respectively of hydrocortisone. Stress dose steroids as above  Hypothyroidism:  Continue Synthroid  Ischemic cardiomyopathy: Acute on chronic combined systolic and diastolic CHF EF 92% in 4268. Bedside ultrasound on admission with what appears to be pretty normal EF, collapsible small IVC with admittedly poor windows.  Hold home carvedilol, Formal TTE EF 40-45% similar to prior Continue IV Lasix.  Atrial fibrillation with RVR: A. fib is chronic.  On warfarin at home for stroke prophylaxis, INR elevated greater  than 8 on admission.  No active signs or symptoms of bleeding.  Hemoglobin stable/at baseline on initial check.  Rates improving as shock improves Hold carvedilol given hypotension Hematology receiving anticoagulation.  Will monitor.  Anemia without evidence of bleeding Transfuse for hgb < 7 Holding ac   Mild thrombocytopenia improving,   Hypothyroidism Cont synthroid   Obesity: Body mass index is 37.39 kg/m.   Nutrition Problem: Increased nutrient needs Etiology: acute illness, catabolic illness, cancer and cancer related treatments Interventions: Interventions: MVI, Magic cup, Other (Comment) [employer] 1.4 po)  Diet: cardiac diet DVT Prophylaxis: Place and maintain sequential compression device Start: 09/14/19 0930 Place TED hose Start: 09/14/19 0930  Advance goals of care discussion: Full code  Family Communication: no family was present at bedside, at the time of interview.   Disposition:  Status is: Inpatient  Remains inpatient appropriate because:Hemodynamically unstable  Dispo: The patient is from: Home              Anticipated d/c is to: Home              Anticipated d/c date is: 3 days              Patient currently is not medically stable to d/c.   Subjective: No acute complaints.  No acute symptoms.  Continues to have fatigue and tiredness as well as minimal oral intake.  Physical Exam:  General: Appear in mild distress, no Rash; Oral Mucosa Clear, moist. no Abnormal Neck Mass Or lumps, Conjunctiva normal  Cardiovascular: S1 and S2 Present, no Murmur, Respiratory: good respiratory effort, Bilateral Air entry present and CTA, no Crackles, no wheezes Abdomen: Bowel Sound present, Soft and mild tenderness Extremities: bilateral Pedal  edema Neurology: alert and oriented to time, place, and person affect appropriate. no new focal deficit Gait not checked due to patient safety concerns  Vitals:   09/16/19 2136 09/17/19 0500 09/17/19 0525 09/17/19  1248  BP: 131/76  122/85 120/84  Pulse: (!) 107  68 71  Resp: 18  16 15   Temp: (!) 97.4 F (36.3 C)  (!) 97.5 F (36.4 C) (!) 97.5 F (36.4 C)  TempSrc: Oral  Oral Oral  SpO2: 97%  96% 97%  Weight:  118.2 kg    Height:        Intake/Output Summary (Last 24 hours) at 09/17/2019 2009 Last data filed at 09/17/2019 1712 Gross per 24 hour  Intake 120 ml  Output 1950 ml  Net -1830 ml   Filed Weights   09/15/19 0602 09/16/19 0500 09/17/19 0500  Weight: 121.3 kg 120.2 kg 118.2 kg   Data Reviewed: I have personally reviewed and interpreted daily labs, tele strips, imagings as discussed above. I reviewed all nursing notes, pharmacy notes, vitals, pertinent old records I have discussed plan of care as described above with RN and patient/family.  CBC: Recent Labs  Lab 09/12/19 0300 09/12/19 0300 09/13/19 0655 09/13/19 1545 09/13/19 2100 09/14/19 0430 09/15/19 0400 09/16/19 0352 09/17/19 0420  WBC 6.1   < > 1.8*   < > 2.4* 2.7* 5.4 8.5 8.9  NEUTROABS 1.9  --  1.2*  --   --  1.6* 3.3 5.6  --   HGB 10.3*   < > 7.9*   < > 7.5* 7.3* 7.6* 7.7* 9.1*  HCT 32.9*   < > 24.5*   < > 23.6* 23.3* 23.7* 24.3* 28.5*  MCV 97.3   < > 93.5   < > 95.5 95.5 95.2 94.9 95.6  PLT 129*   < > 59*   < > 42* 38* 45* 64* 85*   < > = values in this interval not displayed.   Basic Metabolic Panel: Recent Labs  Lab 09/12/19 0300 09/12/19 0300 09/13/19 0500 09/14/19 0430 09/15/19 0400 09/16/19 0352 09/17/19 0420  NA 136  --  137 137 136 137  --   K 3.5  --  3.0* 3.2* 3.6 3.3*  --   CL 106  --  101 102 104 103  --   CO2 19*  --  22 23 23 25   --   GLUCOSE 174*  --  232* 198* 203* 260*  --   BUN 32*  --  32* 34* 38* 44*  --   CREATININE 1.67*  --  1.29* 1.21 1.15 1.15  --   CALCIUM 7.4*  --  7.6* 8.1* 8.3* 8.2*  --   MG 1.8   < > 2.0 2.0 2.4 2.2 2.0  PHOS 5.5*  --  3.8 3.0 2.6 2.8  --    < > = values in this interval not displayed.    Studies: No results found.  Scheduled Meds: .  acidophilus  1 capsule Oral Daily  . acyclovir  400 mg Oral BID  . allopurinol  300 mg Oral q morning - 10a  . atorvastatin  40 mg Oral Daily  . Chlorhexidine Gluconate Cloth  6 each Topical Daily  . enoxaparin (LOVENOX) injection  1 mg/kg Subcutaneous Q12H  . famotidine  20 mg Oral Daily  . feeding supplement (KATE FARMS STANDARD 1.4)  325 mL Oral BID BM  . furosemide  20 mg Intravenous BID  . hydrocortisone  20 mg Oral QPM  .  insulin aspart  0-15 Units Subcutaneous TID WC  . insulin aspart  0-5 Units Subcutaneous QHS  . levothyroxine  88 mcg Oral QAC breakfast  . mouth rinse  15 mL Mouth Rinse BID  . multivitamin with minerals  1 tablet Oral Daily  . simethicone  80 mg Oral QID  . sodium chloride flush  10-40 mL Intracatheter Q12H  . vancomycin  125 mg Oral QID  . Warfarin - Pharmacist Dosing Inpatient   Does not apply q1600   Continuous Infusions:  PRN Meds: acetaminophen, lip balm, magic mouthwash w/lidocaine, melatonin, morphine injection, ondansetron (ZOFRAN) IV  Time spent: 35 minutes  Author: Berle Mull, MD [facility] 09/17/2019 8:09 PM  To reach On-call, see care teams to locate the attending and reach out via [email or web address]. Between 7PM-7AM, please contact night-coverage If you still have difficulty reaching the attending provider, please page the [facility] (Director on Call) for [facility] on amion for assistance.

## 2019-09-17 NOTE — Progress Notes (Signed)
ANTICOAGULATION CONSULT NOTE  Pharmacy Consult for warfarin Indication: atrial fibrillation  No Known Allergies  Patient Measurements: Height: 5\' 10"  (177.8 cm) Weight: 118.2 kg (260 lb 9.3 oz) IBW/kg (Calculated) : 73  Vital Signs: Temp: 97.5 F (36.4 C) (09/14 0525) Temp Source: Oral (09/14 0525) BP: 122/85 (09/14 0525) Pulse Rate: 68 (09/14 0525)  Labs: Recent Labs    09/15/19 0400 09/15/19 0400 09/16/19 0352 09/17/19 0420  HGB 7.6*   < > 7.7* 9.1*  HCT 23.7*  --  24.3* 28.5*  PLT 45*  --  64* 85*  LABPROT 14.0  --  14.7 16.4*  INR 1.1  --  1.2 1.4*  CREATININE 1.15  --  1.15  --    < > = values in this interval not displayed.    Estimated Creatinine Clearance: 75.9 mL/min (by C-G formula based on SCr of 1.15 mg/dL).   Medications: Warfarin 2.5 mg and 5 mg alternating every other day PTA INR on admission: 8.7  Assessment: Pt is a 36 yoM on warfarin PTA for history of atrial fibrillation. PMH significant for metastatic colon cancer.  Pt admitted with mutifactorial shock (septic, hypovolemic), currently on PO vancomycin for c.diff.    Significant Events: -9/8: INR 8.7, metronidazole dose given -9/9: INR >10, no bleeding. Vitamin K 2.5 + 10 mg IV; FFP x 2 -9/10: INR 1.8 s/p vitamin K -9/11: Resume warfarin dosing -9/13: PRBC x 1, Lovenox bridge per Onc (no bleeding noted; Onc expects slow Hgb recovery given DM and Chemo)  Today, 09/17/19  INR remains subtherapeutic as expected following large IV vitamin K dose on 9/9, but trending appropriately up  CBC: Hgb improved after PRBC yesterday, Plt low but improving; no evidence of bleeding. Pt received chemotherapy on 8/31, likely contributing to low counts  Diet: Heart healthy, carb modified. Intake improved to ~50%  No significant DDI  Onc bridging with Lovenox 1 mg/kg q12 hr  Goal of Therapy:  INR 2-3 Monitor platelets by anticoagulation protocol: Yes   Plan:   Continue Lovenox 120 mg SQ q12 per Onc  while INR < 2.0; dosing appropriate  Warfarin 3 mg PO tonight - suspect lingering effects from VitK, so not wanting to overcompensate with aggressive warf dosing  INR daily  Follow for signs/symptoms of bleeding  Reuel Boom, PharmD, BCPS [phone] 09/17/2019, 7:42 AM

## 2019-09-17 NOTE — NC FL2 (Signed)
[place] LEVEL OF CARE SCREENING TOOL     IDENTIFICATION  Patient Name: Zachary Willis Birthdate: [birth year] Sex: male Admission Date (Current Location): 09/11/2019  [facility] and [place] Number:  Herbalist and Address:  [facility],  [address], [place]      Provider Number: [id number]  Attending Physician Name and Address:  Lavina Hamman, MD  Relative Name and Phone Number:  Daimon, Kean [phone]  [phone]    Current Level of Care: Hospital Recommended Level of Care: [place] Prior Approval Number:    Date Approved/Denied:   PASRR Number: [id number]  Discharge Plan: SNF    Current Diagnoses: Patient Active Problem List   Diagnosis Date Noted  . AKI (acute kidney injury) ([place]) 09/12/2019  . Hypovolemic shock ([place]) 09/12/2019  . Severe sepsis ([place]) 09/12/2019  . Atrial fibrillation ([place]) 09/12/2019  . C. difficile diarrhea 09/12/2019  . Adrenal insufficiency ([place]) 09/12/2019  . Shock ([place]) 09/11/2019  . Colon cancer metastasized to liver ([place]) 08/30/2017  . Goals of care, counseling/discussion 08/30/2017  . NEPHROLITHIASIS 12/28/2009  . ABDOMINAL PAIN, RIGHT LOWER QUADRANT 12/28/2009    Orientation RESPIRATION BLADDER Height & Weight     Self, Time, Situation, Place  Normal Incontinent Weight: 260 lb 9.3 oz (118.2 kg) Height:  5\' 10"  (177.8 cm)  BEHAVIORAL SYMPTOMS/MOOD NEUROLOGICAL BOWEL NUTRITION STATUS      Continent Diet (Carb Modified)  AMBULATORY STATUS COMMUNICATION OF NEEDS Skin   Limited Assist Verbally Surgical wounds                       Personal Care Assistance Level of Assistance  Bathing, Feeding, Dressing Bathing Assistance: Limited assistance Feeding assistance: Independent Dressing Assistance: Limited assistance     Functional Limitations Info  Sight, Hearing, Speech Sight Info: Adequate Hearing Info: Adequate Speech Info: Adequate    SPECIAL CARE  FACTORS FREQUENCY  PT (By licensed PT), OT (By licensed OT)     PT Frequency: Minimum 5x a week OT Frequency: Minimum 5x a week            Contractures Contractures Info: Not present    Additional Factors Info  Code Status, Allergies, Insulin Sliding Scale, Isolation Precautions Code Status Info: Full Code Allergies Info: nka   Insulin Sliding Scale Info: insulin aspart (novoLOG) injection 0-15 Units 3x a day with meals Isolation Precautions Info: Enteric pre     Current Medications (09/17/2019):  This is the current hospital active medication list Current Facility-Administered Medications  Medication Dose Route Frequency Provider Last Rate Last Admin  . acetaminophen (TYLENOL) tablet 650 mg  650 mg Oral Q6H PRN Lavina Hamman, MD      . acidophilus (RISAQUAD) capsule 1 capsule  1 capsule Oral Daily Lavina Hamman, MD   1 capsule at 09/17/19 0844  . acyclovir (ZOVIRAX) tablet 400 mg  400 mg Oral BID Hunsucker, Bonna Gains, MD   400 mg at 09/17/19 0844  . allopurinol (ZYLOPRIM) tablet 300 mg  300 mg Oral q morning - 10a Hunsucker, Bonna Gains, MD   300 mg at 09/17/19 0845  . atorvastatin (LIPITOR) tablet 40 mg  40 mg Oral Daily Hunsucker, Bonna Gains, MD   40 mg at 09/17/19 0844  . Chlorhexidine Gluconate Cloth 2 % PADS 6 each  6 each Topical Daily Hunsucker, Bonna Gains, MD   6 each at 09/17/19 0845  . enoxaparin (LOVENOX) injection 120 mg  1  mg/kg Subcutaneous Q12H [name], MD   120 mg at 09/17/19 0843  . famotidine (PEPCID) tablet 20 mg  20 mg Oral Daily Lavina Hamman, MD   20 mg at 09/17/19 0845  . feeding supplement (KATE FARMS STANDARD 1.4) liquid 325 mL  325 mL Oral BID BM Hunsucker, Bonna Gains, MD   325 mL at 09/17/19 0843  . furosemide (LASIX) injection 20 mg  20 mg Intravenous BID Lavina Hamman, MD   20 mg at 09/17/19 1705  . hydrocortisone (CORTEF) tablet 20 mg  20 mg Oral QPM Lavina Hamman, MD   20 mg at 09/17/19 1704  . insulin aspart (novoLOG) injection 0-15  Units  0-15 Units Subcutaneous TID WC Hunsucker, Bonna Gains, MD   2 Units at 09/17/19 1704  . insulin aspart (novoLOG) injection 0-5 Units  0-5 Units Subcutaneous QHS Hunsucker, Bonna Gains, MD   2 Units at 09/15/19 2132  . levothyroxine (SYNTHROID) tablet 88 mcg  88 mcg Oral QAC breakfast Hunsucker, Bonna Gains, MD   88 mcg at 09/17/19 0513  . lip balm (CARMEX) ointment   Topical PRN [name], MD      . magic mouthwash w/lidocaine  15 mL Oral TID PRN Hunsucker, Bonna Gains, MD   15 mL at 09/12/19 2139  . MEDLINE mouth rinse  15 mL Mouth Rinse BID Hunsucker, Bonna Gains, MD   15 mL at 09/17/19 0846  . melatonin tablet 6 mg  6 mg Oral QHS PRN Lang Snow, FNP   6 mg at 09/16/19 2203  . morphine 2 MG/ML injection 2-4 mg  2-4 mg Intravenous Q4H PRN Lavina Hamman, MD      . multivitamin with minerals tablet 1 tablet  1 tablet Oral Daily Hunsucker, Bonna Gains, MD   1 tablet at 09/17/19 0846  . ondansetron (ZOFRAN) injection 4 mg  4 mg Intravenous Q6H PRN Hunsucker, Bonna Gains, MD      . simethicone (MYLICON) chewable tablet 80 mg  80 mg Oral QID Lavina Hamman, MD   80 mg at 09/17/19 1704  . sodium chloride flush (NS) 0.9 % injection 10-40 mL  10-40 mL Intracatheter Q12H Hunsucker, Bonna Gains, MD   10 mL at 09/17/19 0847  . vancomycin (VANCOCIN) 50 mg/mL oral solution 125 mg  125 mg Oral QID Hunsucker, Bonna Gains, MD   125 mg at 09/17/19 1712  . Warfarin - Pharmacist Dosing Inpatient   Does not apply q1600 Lenis Noon, [facility]       Facility-Administered Medications Ordered in Other Encounters  Medication Dose Route Frequency Provider Last Rate Last Admin  . sodium chloride flush (NS) 0.9 % injection 10 mL  10 mL Intravenous PRN Eliezer Bottom, NP   10 mL at 07/17/19 3716     Discharge Medications: Please see discharge summary for a list of discharge medications.  Relevant Imaging Results:  Relevant Lab Results:   Additional Information SSN [id number]  Ross Ludwig, LCSW

## 2019-09-17 NOTE — Progress Notes (Signed)
Received report from off going RN, agree with previous assessment, will continue to monitor

## 2019-09-17 NOTE — Progress Notes (Signed)
Zachary Willis seems to be slowly improving.  He has some physical therapy yesterday.  He did walk a little bit more.  He said he walked down to the nurses station.  I know that physical therapy is working real hard with him.  He said that he ate a little bit more yesterday.  He still does not have much of an appetite.  He did get 1 unit of blood yesterday.  I am still awaiting the results of his CBC to come back today.  I am unsure is having any more diarrhea.  He is having no problems with pain.  There is no bleeding.  He has had no cough or increased shortness of breath.  His INR is 1.4.  He now is on Lovenox until the Coumadin does get therapeutic.  Hopefully this will happen in a couple days.  I spoke to his wife yesterday.  She is worried about him going home too soon.  She just did not come be able to care for him at home if he gets home too soon and is too weak.  I totally understand this.  On his physical exam, his temperature is 97.5.  Pulse 68.  Blood pressure 122/85.  His lungs sound pretty clear bilaterally.  I do not hear any wheezes.  Cardiac exam irregular rate and irregular rhythm consistent with atrial fibrillation.  Abdomen is slightly distended.  Bowel sounds are still high-pitched.  Bowel sounds are present.  There is no guarding or rebound tenderness.  Extremities shows some chronic mild pitting edema in his legs.  Zachary Willis is still recovering from the C. difficile colitis.  This is still can take several weeks before he finally gets his old strength back.  I told his wife this.  She is worried that he is just not making much progress.  I told her that he is making good progress.  The amount of time that takes to recover from colitis secondary to C. difficile is probably a month or so, particularly for Zachary Willis given that he has cancer, has been on chemotherapy, and has cardiac issues.  I think she felt better after talking to [place] yesterday.  I know that he is getting outstanding  care from all the staff up on [address].  I know they are doing a good job with him.  Lattie Haw, MD  [place] [phone]

## 2019-09-17 NOTE — Progress Notes (Signed)
Inpatient Diabetes Program Recommendations  AACE/ADA: New Consensus Statement on Inpatient Glycemic Control (2015)  Target Ranges:  Prepandial:   less than 140 mg/dL      Peak postprandial:   less than 180 mg/dL (1-2 hours)      Critically ill patients:  140 - 180 mg/dL   Lab Results  Component Value Date   GLUCAP 179 (H) 09/17/2019   HGBA1C 8.9 (H) 09/12/2019    Spoke with wife regarding A1c level, checking glucose at home, and possibly having pt on medication at home. Spoke about diet and follow up. Wife requests prescription for glucose meter at time of d/c.  Glucose meter kit order #[id number]  Thanks,  Tama Headings RN, MSN, BC-ADM Inpatient Diabetes Coordinator Team Pager [phone] (8a-5p)

## 2019-09-17 NOTE — TOC Progression Note (Signed)
Transition of Care [facility]) - Progression Note    Patient Details  Name: WINDEL KEZIAH MRN: [id number] Date of Birth: [birth year]  Transition of Care [facility]) CM/SW Contact  Ross Ludwig, [place] Phone Number: 09/17/2019, 5:19 PM  Clinical Narrative:     CSW was informed by physician that patient may need to go to SNF.  CSW faxed clinicals to different facilities waiting for responses back.   Expected Discharge Plan: Home/Self Care Barriers to Discharge: Continued Medical Work up  Expected Discharge Plan and Services Expected Discharge Plan: Home/Self Care   Discharge Planning Services: CM Consult   Living arrangements for the past 2 months: Single Family Home                                       Social Determinants of Health (SDOH) Interventions    Readmission Risk Interventions No flowsheet data found.

## 2019-09-17 NOTE — Progress Notes (Signed)
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  Spouse asking how pt is doing with mobility.  Discussed slow progression however expected and also discussed HHPT upon d/c and provided ideas for continuing safety with mobility at home (chair follow and using BSC if pt weak/tired).  Pt appeared more at ease with provided recommendations (she doesn't want pt to need SNF, both prefer d/c home).   Follow Up Recommendations  Home health PT;Supervision/Assistance - 24 hour     Equipment Recommendations  None recommended by PT    Recommendations for Other Services       Precautions / Restrictions Precautions Precautions: Fall Precaution Comments: rectal pouch    Mobility  Bed Mobility Overal bed mobility: Needs Assistance Bed Mobility: Supine to Sit     Supine to sit: Min guard     General bed mobility comments: increased time and effort today  Transfers Overall transfer level: Needs assistance Equipment used: Rolling walker (2 wheeled) Transfers: Sit to/from Stand Sit to Stand: Min assist         General transfer comment: verbal cues for hand placement, assist to rise today, pt reports R LE feels weak today  Ambulation/Gait Ambulation/Gait assistance: Min assist;Min guard Gait Distance (Feet): 120 Feet Assistive device: Rolling walker (2  wheeled) Gait Pattern/deviations: Step-through pattern;Decreased stride length;Trunk flexed     General Gait Details: verbal cues for RW positioning, posture; followed with recliner for safety; distance to tolerance; multiple short standing rest breaks   Stairs             Wheelchair Mobility    Modified Rankin (Stroke Patients Only)       Balance                                            Cognition Arousal/Alertness: Awake/alert Behavior During Therapy: WFL for tasks assessed/performed Overall Cognitive Status: Within Functional Limits for tasks assessed                                        Exercises      General Comments        Pertinent Vitals/Pain Pain Assessment: No/denies pain    Home Living                      Prior Function            PT Goals (current goals can now be found in the care plan section) Progress towards PT goals: Progressing toward goals    Frequency    Min 3X/week      PT Plan Current plan remains appropriate    Co-evaluation  AM-PAC PT "6 Clicks" Mobility   Outcome Measure  Help needed turning from your back to your side while in a flat bed without using bedrails?: None Help needed moving from lying on your back to sitting on the side of a flat bed without using bedrails?: A Little Help needed moving to and from a bed to a chair (including a wheelchair)?: A Little Help needed standing up from a chair using your arms (e.g., wheelchair or bedside chair)?: A Little Help needed to walk in hospital room?: A Little Help needed climbing 3-5 steps with a railing? : A Lot 6 Click Score: 18    End of Session Equipment Utilized During Treatment: Gait belt Activity Tolerance: Patient tolerated treatment well Patient left: with call bell/phone within reach;with family/visitor present;in bed;with bed alarm set Nurse Communication: Mobility status PT Visit  Diagnosis: Difficulty in walking, not elsewhere classified (R26.2)     Time: 8441-7127 PT Time Calculation (min) (ACUTE ONLY): 30 min  Charges:  $Gait Training: 8-22 mins                    Arlyce Dice, DPT Acute Rehabilitation Services Pager: [phone] Office: [phone]   York Ram E 09/17/2019, 5:05 PM

## 2019-09-18 LAB — BASIC METABOLIC PANEL
Anion gap: 5 (ref 5–15)
BUN: 30 mg/dL — ABNORMAL HIGH (ref 8–23)
CO2: 29 mmol/L (ref 22–32)
Calcium: 7.6 mg/dL — ABNORMAL LOW (ref 8.9–10.3)
Chloride: 102 mmol/L (ref 98–111)
Creatinine, Ser: 1.11 mg/dL (ref 0.61–1.24)
GFR calc Af Amer: >60 mL/min (ref >60)
GFR calc non Af Amer: >60 mL/min (ref >60)
Glucose, Bld: 252 mg/dL — ABNORMAL HIGH (ref 70–99)
Potassium: 2.9 mmol/L — ABNORMAL LOW (ref 3.5–5.1)
Sodium: 136 mmol/L (ref 135–145)

## 2019-09-18 LAB — PROTIME-INR
INR: 1.8 — ABNORMAL HIGH (ref 0.8–1.2)
Prothrombin Time: 19.8 seconds — ABNORMAL HIGH (ref 11.4–15.2)

## 2019-09-18 LAB — GLUCOSE, CAPILLARY
Glucose-Capillary: 221 mg/dL — ABNORMAL HIGH (ref 70–99)
Glucose-Capillary: 243 mg/dL — ABNORMAL HIGH (ref 70–99)
Glucose-Capillary: 235 mg/dL — ABNORMAL HIGH (ref 70–99)
Glucose-Capillary: 159 mg/dL — ABNORMAL HIGH (ref 70–99)
Glucose-Capillary: 195 mg/dL — ABNORMAL HIGH (ref 70–99)

## 2019-09-18 LAB — CULTURE, BLOOD (ROUTINE X 2)
Culture: NO GROWTH
Special Requests: ADEQUATE

## 2019-09-18 LAB — CBC
HCT: 28.5 % — ABNORMAL LOW (ref 39.0–52.0)
Hemoglobin: 9.1 g/dL — ABNORMAL LOW (ref 13.0–17.0)
MCH: 30.5 pg (ref 26.0–34.0)
MCHC: 31.9 g/dL (ref 30.0–36.0)
MCV: 95.6 fL (ref 80.0–100.0)
Platelets: 87 10*3/uL — ABNORMAL LOW (ref 150–400)
RBC: 2.98 MIL/uL — ABNORMAL LOW (ref 4.22–5.81)
RDW: 18.4 % — ABNORMAL HIGH (ref 11.5–15.5)
WBC: 9.7 10*3/uL (ref 4.0–10.5)
nRBC: 2.6 % — ABNORMAL HIGH (ref 0.0–0.2)

## 2019-09-18 MED ORDER — POTASSIUM CHLORIDE 10 MEQ/50ML IV SOLN
10.0000 meq | INTRAVENOUS | Status: AC
  Administered 2019-09-18 (×6): 10 meq via INTRAVENOUS
  Filled 2019-09-18 (×6): qty 50

## 2019-09-18 MED ORDER — HYDROCORTISONE 20 MG PO TABS
30.0000 mg | ORAL_TABLET | Freq: Every day | ORAL | Status: DC
  Administered 2019-09-18 – 2019-09-19 (×2): 30 mg via ORAL
  Filled 2019-09-18 (×3): qty 1

## 2019-09-18 MED ORDER — HYDROCORTISONE 10 MG PO TABS
10.0000 mg | ORAL_TABLET | Freq: Every evening | ORAL | Status: DC
  Administered 2019-09-18 – 2019-09-19 (×2): 10 mg via ORAL
  Filled 2019-09-18 (×3): qty 1

## 2019-09-18 MED ORDER — WARFARIN SODIUM 5 MG PO TABS
5.0000 mg | ORAL_TABLET | Freq: Once | ORAL | Status: AC
  Administered 2019-09-18: 5 mg via ORAL
  Filled 2019-09-18: qty 1

## 2019-09-18 NOTE — Progress Notes (Signed)
ANTICOAGULATION CONSULT NOTE  Pharmacy Consult for warfarin Indication: atrial fibrillation  No Known Allergies  Patient Measurements: Height: 5\' 10"  (177.8 cm) Weight: 116.5 kg (256 lb 13.4 oz) IBW/kg (Calculated) : 73  Vital Signs: Temp: 97.9 F (36.6 C) (09/15 1114) Temp Source: Oral (09/15 1114) BP: 122/77 (09/15 1114) Pulse Rate: 63 (09/15 1114)  Labs: Recent Labs    09/16/19 0352 09/16/19 0352 09/17/19 0420 09/18/19 0529  HGB 7.7*   < > 9.1* 9.1*  HCT 24.3*  --  28.5* 28.5*  PLT 64*  --  85* 87*  LABPROT 14.7  --  16.4* 19.8*  INR 1.2  --  1.4* 1.8*  CREATININE 1.15  --   --  1.11   < > = values in this interval not displayed.    Estimated Creatinine Clearance: 78 mL/min (by C-G formula based on SCr of 1.11 mg/dL).   Medications: Warfarin 2.5 mg and 5 mg alternating every other day PTA INR on admission: 8.7  Assessment: Pt is a 56 yoM on warfarin PTA for history of atrial fibrillation. PMH significant for metastatic colon cancer.  Pt admitted with mutifactorial shock (septic, hypovolemic), currently on PO vancomycin for c.diff.    Significant Events: -9/8: INR 8.7, metronidazole dose given -9/9: INR >10, no bleeding. Vitamin K 2.5 + 10 mg IV; FFP x 2 -9/10: INR 1.8 s/p vitamin K -9/11: Resume warfarin dosing -9/13: PRBC x 1, Lovenox bridge per Onc (no bleeding noted; Onc expects slow Hgb recovery given DM and Chemo)  Today, 09/18/19  INR remains subtherapeutic as expected following large IV vitamin K dose on 9/9, but trending appropriately up  CBC: Hgb low but improved and stable after PRBC 9/13, Plt low but improving; no evidence of bleeding. Pt received chemotherapy on 8/31, likely contributing to low counts  Diet: Heart healthy, carb modified. Intake improved to ~100%  No significant DDI  Onc bridging with Lovenox 1 mg/kg q12 hr  Goal of Therapy:  INR 2-3 Monitor platelets by anticoagulation protocol: Yes   Plan:   Continue Lovenox 120 mg  SQ q12 per Onc while INR < 2.0; dosing appropriate  Warfarin 5 mg PO tonight - suspect lingering effects from VitK, and poor PO intake until recently, so would not dose aggressively  For discharge, would resume PTA dosing (above) with INR recheck in 3-5 days  INR daily  Follow for signs/symptoms of bleeding  Reuel Boom, PharmD, BCPS [phone] 09/18/2019, 11:42 AM

## 2019-09-18 NOTE — Progress Notes (Signed)
Nutrition Follow-up  DOCUMENTATION CODES:   Obesity unspecified  INTERVENTION:   -[employer] 1.4 PO BID, each provides 455 kcals and 20g protein -Magic Cup BID with meals, each supplement provides 290 kcal and 9 grams of protein. - 1 tablet multivitamin with minerals/day.   NUTRITION DIAGNOSIS:   Increased nutrient needs related to acute illness, catabolic illness, cancer and cancer related treatments as evidenced by estimated needs.  Ongoing.  GOAL:   Patient will meet greater than or equal to 90% of their needs  Progressing.  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 He presented to the ED with couple days of diarrhea, low blood pressure, new fever the AM of presentation. He was admitted for presumed septic shock.  Pt consumed 100% of breakfast this morning. Did not eat well yesterday d/t not feeling well (~10-20%). Pt was having diarrhea as well.  Pt is drinking [employer] supplements.   Admission weight: 255 lbs. Current weight: 256 lbs.  Medications: IV Lasix, Multivitamin with minerals daily, IV KCl  Labs reviewed: CBGs: 221-243 Low K  Diet Order:   Diet Order            Diet Heart Room service appropriate? Yes; Fluid consistency: Thin  Diet effective now                 EDUCATION NEEDS:   No education needs have been identified at this time  Skin:  Skin Assessment: Skin Integrity Issues: Skin Integrity Issues:: Other (Comment) Other: skin tear on right buttocks  Last BM:  9/14 -rectal tube  Height:   Ht Readings from Last 1 Encounters:  09/11/19 5\' 10"  (1.778 m)    Weight:   Wt Readings from Last 1 Encounters:  09/18/19 116.5 kg   BMI:  Body mass index is 36.85 kg/m.  Estimated Nutritional Needs:    Kcal:  2725-3664 kcal  Protein:  115-130 grams  Fluid:  >/= 2.5 L/day  Clayton Bibles, MS, RD, LDN Inpatient Clinical Dietitian Contact information available via Amion

## 2019-09-18 NOTE — Plan of Care (Signed)
  Problem: Pain Managment: Goal: General experience of comfort will improve Outcome: Progressing   Problem: Safety: Goal: Ability to remain free from injury will improve Outcome: Progressing   

## 2019-09-18 NOTE — Progress Notes (Signed)
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  Pt able to improve ambulation again today however more fatigued upon return to room.  Pt pleasant and motivated to improve mobility and d/c home.   Follow Up Recommendations  Home health PT;Supervision/Assistance - 24 hour     Equipment Recommendations  None recommended by PT    Recommendations for Other Services       Precautions / Restrictions Precautions Precautions: Fall Precaution Comments: rectal pouch    Mobility  Bed Mobility Overal bed mobility: Needs Assistance Bed Mobility: Supine to Sit;Sit to Supine     Supine to sit: Min guard Sit to supine: Min assist   General bed mobility comments: increased time and effort today; assist for LEs onto bed due to fatigue  Transfers Overall transfer level: Needs assistance Equipment used: Rolling walker (2 wheeled) Transfers: Sit to/from Stand Sit to Stand: Min assist         General transfer comment: verbal cues for hand placement, assist to control descent to bed due to fatigue, assist for second stand after ambulation to reposition in bed  Ambulation/Gait Ambulation/Gait assistance: Min guard Gait Distance (Feet): 180 Feet Assistive device: Rolling walker (2 wheeled) Gait Pattern/deviations: Step-through pattern;Decreased stride length;Trunk flexed     General Gait  Details: verbal cues for RW positioning, posture; followed with recliner for safety; distance to tolerance; multiple short standing rest breaks   Stairs             Wheelchair Mobility    Modified Rankin (Stroke Patients Only)       Balance                                            Cognition Arousal/Alertness: Awake/alert Behavior During Therapy: WFL for tasks assessed/performed Overall Cognitive Status: Within Functional Limits for tasks assessed                                        Exercises      General Comments        Pertinent Vitals/Pain Pain Assessment: No/denies pain    Home Living                      Prior Function            PT Goals (current goals can now be found in the care plan section) Progress towards PT goals: Progressing toward goals    Frequency    Min 3X/week      PT Plan Current plan remains appropriate    Co-evaluation              AM-PAC PT "6 Clicks" Mobility   Outcome Measure  Help needed turning from  your back to your side while in a flat bed without using bedrails?: None Help needed moving from lying on your back to sitting on the side of a flat bed without using bedrails?: A Little Help needed moving to and from a bed to a chair (including a wheelchair)?: A Little Help needed standing up from a chair using your arms (e.g., wheelchair or bedside chair)?: A Little Help needed to walk in hospital room?: A Little Help needed climbing 3-5 steps with a railing? : A Lot 6 Click Score: 18    End of Session Equipment Utilized During Treatment: Gait belt Activity Tolerance: Patient tolerated treatment well Patient left: with call bell/phone within reach;with family/visitor present;in bed;with bed alarm set Nurse Communication: Mobility status PT Visit Diagnosis: Difficulty in walking, not elsewhere classified (R26.2)     Time: 1500-1520 PT Time Calculation (min)  (ACUTE ONLY): 20 min  Charges:  $Gait Training: 8-22 mins                    Arlyce Dice, DPT Acute Rehabilitation Services Pager: [phone] Office: [phone]  York Ram E 09/18/2019, 4:06 PM

## 2019-09-18 NOTE — Progress Notes (Signed)
Resumed care for this pt at 0300. I agree with previous RN's assessment.

## 2019-09-18 NOTE — Progress Notes (Addendum)
PROGRESS NOTE  Zachary Willis  KGM:[id number] DOB: 04-12-1945 DOA: 09/11/2019 PCP: Tonye Becket., MD   Brief Narrative: Zachary Willis is a 74 y.o. 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 Hospitalization also noted for AKI, improving, febrile neutropenia for which neulasta was given, and supratherapeutic INR on admission which has resolved.  Assessment & Plan: Principal Problem:   Shock ([place]) Active Problems:   Colon cancer metastasized to liver ([place])   AKI (acute kidney injury) ([place])   Hypovolemic shock (H[facility])   Severe sepsis (HCC)   Atrial fibrillation (HCC)   C. difficile diarrhea   Adrenal insufficiency (H[facility])  Shock, septic and hypovolemic due to C. difficile colitis and diarrhea complicated by adrenal insufficiency, POA, resolved.  - Complete oral vancomycin x10 days - Tapering stress steroids   AKI: Likely ATN in the setting of hypovolemia and shock. Resolved.  Monitor urine output.  Currently receiving IV Lasix.  Hypokalemia:  - Supplement via IV and monitor  Febrile neutropenia: In setting of chemotherapy, now resolved s/p neulasta  Hypopituitarism s/p pituitary tumor resection:  - Taper hydrocortisone to 30mg  qAM and 10mg  qPM. Reported home dose is 15mg  / 5 mg.  - Continue synthroid - Restart testosterone at discharge.   Ischemic cardiomyopathy, CAD s/p CABG, acute on chronic combined HFrEF: Formal TTE LVEF 40-45% similar to prior - Continue lasix 20mg  IV BID - Holding coreg with hypotension. BP and HR both now at goal.  Metastatic colon cancer:  - Dr. Marin Olp following. Really appreciate his care.  Chronic atrial fibrillation with RVR: RVR resolved.  -  Continue coumadin since supratherapeutic INR has resolved. Bridging with lovenox given his active malignancy and hospitalization/immobility.  - Holding coreg for now, rate is controlled.   T2DM: In setting of chronic steroids and decadron with chemo, HbA1c 8.9%.  - Continue SSI. Will monitor CBGs going forward with steroid taper and consider oral Tx at DC.   Anemia of chronic disease: In setting of malignancy.  - Stable, monitor for bleeding.  Mild thrombocytopenia: Stable  Obesity: Body mass index is 36.85 kg/m.   DVT prophylaxis: Lovenox, coumadin Code Status: Full Family Communication: None at bedside Disposition Plan:  Status is: Inpatient  Remains inpatient appropriate because:Inpatient level of care appropriate due to severity of illness   Dispo: The patient is from: Home              Anticipated d/c is to: Home              Anticipated d/c date is: 3 days              Patient currently is not medically stable to d/c.  Consultants:   PCCM  Nephrology  Cardiology  Oncology  Procedures:   None  Antimicrobials:  Vancomycin 9/8 - 9/8  Cefepime 9/8 - 9/9  Acyclovir 9/8 >>  Vancomycin po 9/8 >> 9/18  Subjective: Not eating or drinking much, fatigued, but feels overall improved from previous days. Still with loose stools.   Objective: Vitals:   09/17/19 2119 09/18/19 0500 09/18/19 0518 09/18/19 1114  BP: 124/74  121/80 122/77  Pulse: 73  76 63  Resp: 18  18 17   Temp: 97.7 F (36.5 C)  97.9  F (36.6 C) 97.9 F (36.6 C)  TempSrc: Oral  Oral Oral  SpO2: 96%  97% 98%  Weight:  116.5 kg    Height:        Intake/Output Summary (Last 24 hours) at 09/18/2019 1727 Last data filed at 09/18/2019 1605 Gross per 24 hour  Intake 240 ml  Output 2850 ml  Net -2610 ml   Filed Weights   09/16/19 0500 09/17/19 0500 09/18/19 0500  Weight: 120.2 kg 118.2 kg 116.5 kg    Gen: 74 y.o. male in no distress  Pulm: Non-labored breathing. Clear to auscultation  bilaterally.  CV: Irreg irreg. No murmur, rub, or gallop. No JVD, no pitting pedal edema. GI: Abdomen soft, non-tender, non-distended, with normoactive bowel sounds. No organomegaly or masses felt. Ext: Warm, no deformities Skin: No rashes, lesions or ulcers on visualized skin Neuro: Alert and oriented. No focal neurological deficits. Psych: Judgement and insight appear normal. Mood & affect appropriate.   Data Reviewed: I have personally reviewed following labs and imaging studies  CBC: Recent Labs  Lab 09/12/19 0300 09/12/19 0300 09/13/19 0655 09/13/19 1545 09/14/19 0430 09/15/19 0400 09/16/19 0352 09/17/19 0420 09/18/19 0529  WBC 6.1   < > 1.8*   < > 2.7* 5.4 8.5 8.9 9.7  NEUTROABS 1.9  --  1.2*  --  1.6* 3.3 5.6  --   --   HGB 10.3*   < > 7.9*   < > 7.3* 7.6* 7.7* 9.1* 9.1*  HCT 32.9*   < > 24.5*   < > 23.3* 23.7* 24.3* 28.5* 28.5*  MCV 97.3   < > 93.5   < > 95.5 95.2 94.9 95.6 95.6  PLT 129*   < > 59*   < > 38* 45* 64* 85* 87*   < > = values in this interval not displayed.   Basic Metabolic Panel: Recent Labs  Lab 09/12/19 0300 09/12/19 0300 09/13/19 0500 09/14/19 0430 09/15/19 0400 09/16/19 0352 09/17/19 0420 09/18/19 0529  NA 136   < > 137 137 136 137  --  136  K 3.5   < > 3.0* 3.2* 3.6 3.3*  --  2.9*  CL 106   < > 101 102 104 103  --  102  CO2 19*   < > 22 23 23 25   --  29  GLUCOSE 174*   < > 232* 198* 203* 260*  --  252*  BUN 32*   < > 32* 34* 38* 44*  --  30*  CREATININE 1.67*   < > 1.29* 1.21 1.15 1.15  --  1.11  CALCIUM 7.4*   < > 7.6* 8.1* 8.3* 8.2*  --  7.6*  MG 1.8   < > 2.0 2.0 2.4 2.2 2.0  --   PHOS 5.5*  --  3.8 3.0 2.6 2.8  --   --    < > = values in this interval not displayed.   GFR: Estimated Creatinine Clearance: 78 mL/min (by C-G formula based on SCr of 1.11 mg/dL). Liver Function Tests: Recent Labs  Lab 09/12/19 0300 09/13/19 0500 09/14/19 0430 09/15/19 0400 09/16/19 0352  AST 50* 24 18 23 25   ALT 32 28 26 28 29   ALKPHOS 148*  88 92 161* 199*  BILITOT 2.5* 2.3* 1.7* 1.6* 1.6*  PROT 4.7* 4.8* 4.8* 5.0* 4.6*  ALBUMIN 2.5* 2.5* 2.3* 2.3* 2.3*   No results for input(s): LIPASE, AMYLASE in the last 168 hours. No results for input(s): AMMONIA in the  last 168 hours. Coagulation Profile: Recent Labs  Lab 09/14/19 0430 09/15/19 0400 09/16/19 0352 09/17/19 0420 09/18/19 0529  INR 1.3* 1.1 1.2 1.4* 1.8*   Cardiac Enzymes: No results for input(s): CKTOTAL, CKMB, CKMBINDEX, TROPONINI in the last 168 hours. BNP (last 3 results) No results for input(s): PROBNP in the last 8760 hours. HbA1C: No results for input(s): HGBA1C in the last 72 hours. CBG: Recent Labs  Lab 09/17/19 2117 09/18/19 0754 09/18/19 1111 09/18/19 1209 09/18/19 1710  GLUCAP 231* 221* 243* 235* 159*   Lipid Profile: No results for input(s): CHOL, HDL, LDLCALC, TRIG, CHOLHDL, LDLDIRECT in the last 72 hours. Thyroid Function Tests: No results for input(s): TSH, T4TOTAL, FREET4, T3FREE, THYROIDAB in the last 72 hours. Anemia Panel: No results for input(s): VITAMINB12, FOLATE, FERRITIN, TIBC, IRON, RETICCTPCT in the last 72 hours. Urine analysis:    Component Value Date/Time   COLORURINE YELLOW 09/11/2019 [place] 09/11/2019 1052   LABSPEC 1.019 09/11/2019 1052   PHURINE 5.0 09/11/2019 1052   GLUCOSEU NEGATIVE 09/11/2019 [place] 09/11/2019 1052   HGBUR trace-intact 12/28/2009 [place] 09/11/2019 1052   KETONESUR NEGATIVE 09/11/2019 1052   PROTEINUR NEGATIVE 09/11/2019 1052   UROBILINOGEN 0.2 12/28/2009 1550   NITRITE NEGATIVE 09/11/2019 [place] 09/11/2019 1052   Recent Results (from the past 240 hour(s))  Blood Culture (routine x 2)     Status: None   Collection Time: 09/11/19 10:41 AM   Specimen: BLOOD  Result Value Ref Range Status   Specimen Description   Final    BLOOD PORTA CATH Performed at [facility], [place] [address],  [place], [place] [address]    Special Requests   Final    BOTTLES DRAWN AEROBIC AND ANAEROBIC Blood Culture adequate volume Performed at [place] [address], [place], [place] [address]    Culture   Final    NO GROWTH 7 DAYS Performed at [facility], [place] [address] [place], [place] [address]    Report Status 09/18/2019 FINAL  Final  Urine culture     Status: None   Collection Time: 09/11/19 10:52 AM   Specimen: In/Out Cath Urine  Result Value Ref Range Status   Specimen Description   Final    IN/OUT CATH URINE Performed at [place] [address], [place], [place] [address]    Special Requests   Final    NONE Performed at [facility], [place] [address], [place], [place] [address]    Culture   Final    NO GROWTH Performed at [facility], [place] [address], [place], [place] [address]    Report Status 09/12/2019 FINAL  Final  C Difficile Quick Screen w PCR reflex     Status: Abnormal   Collection Time: 09/11/19 10:52 AM   Specimen: STOOL  Result Value Ref Range Status   C Diff antigen POSITIVE (A) NEGATIVE Final   C Diff toxin NEGATIVE NEGATIVE Final   C Diff interpretation Results are indeterminate. See PCR results.  Final    Comment: Performed at [facility], [place] [address] [place],  [address]  C. Diff by PCR, Reflexed     Status: Abnormal   Collection Time: 09/11/19 10:52 AM  Result Value Ref Range Status   Toxigenic C. Difficile by PCR POSITIVE (A) NEGATIVE Final    Comment: Positive for toxigenic C. difficile with  little to no toxin production. Only treat if clinical presentation suggests symptomatic illness. Performed at [facility], [place] [address], [place], [place] [address]   SARS Coronavirus 2 by RT PCR (hospital order, performed in [facility] hospital lab) Nasopharyngeal Nasopharyngeal Swab     Status: None   Collection Time: 09/11/19 11:56 AM   Specimen:  Nasopharyngeal Swab  Result Value Ref Range Status   SARS Coronavirus 2 NEGATIVE NEGATIVE Final    Comment: (NOTE) SARS-CoV-2 target nucleic acids are NOT DETECTED.  The SARS-CoV-2 RNA is generally detectable in upper and lower respiratory specimens during the acute phase of infection. The lowest concentration of SARS-CoV-2 viral copies this assay can detect is 250 copies / mL. A negative result does not preclude SARS-CoV-2 infection and should not be used as the sole basis for treatment or other patient management decisions.  A negative result may occur with improper specimen collection / handling, submission of specimen other than nasopharyngeal swab, presence of viral mutation(s) within the areas targeted by this assay, and inadequate number of viral copies (<250 copies / mL). A negative result must be combined with clinical observations, patient history, and epidemiological information.  Fact Sheet for Patients:   StrictlyIdeas.no  Fact Sheet for Healthcare Providers: BankingDealers.co.za  This test is not yet approved or  cleared by the [place] FDA and has been authorized for detection and/or diagnosis of SARS-CoV-2 by FDA under an Emergency Use Authorization (EUA).  This EUA will remain in effect (meaning this test can be used) for the duration of the COVID-19 declaration under Section 564(b)(1) of the Act, 21 U.S.C. section 360bbb-3(b)(1), unless the authorization is terminated or revoked sooner.  Performed at [facility], [place] [address], [place], [place] [address]   MRSA PCR Screening     Status: None   Collection Time: 09/11/19  5:04 PM   Specimen: Nasopharyngeal  Result Value Ref Range Status   MRSA by PCR NEGATIVE NEGATIVE Final    Comment:        The GeneXpert MRSA Assay (FDA approved for NASAL specimens only), is one component of a comprehensive MRSA colonization surveillance program. It  is not intended to diagnose MRSA infection nor to guide or monitor treatment for MRSA infections. Performed at [facility], [place] [address], [place],  [address]       Radiology Studies: No results found.  Scheduled Meds: . acyclovir  400 mg Oral BID  . allopurinol  300 mg Oral q morning - 10a  . atorvastatin  40 mg Oral Daily  . Chlorhexidine Gluconate Cloth  6 each Topical Daily  . enoxaparin (LOVENOX) injection  1 mg/kg Subcutaneous Q12H  . famotidine  20 mg Oral Daily  . feeding supplement ([employer] STANDARD 1.4)  325 mL Oral BID BM  . furosemide  20 mg Intravenous BID  . hydrocortisone  10 mg Oral QPM  . hydrocortisone  30 mg Oral Q breakfast  . insulin aspart  0-15 Units Subcutaneous TID WC  . insulin aspart  0-5 Units Subcutaneous QHS  . levothyroxine  88 mcg Oral QAC breakfast  . mouth rinse  15 mL Mouth Rinse BID  . multivitamin with minerals  1 tablet Oral Daily  . simethicone  80 mg Oral QID  . sodium chloride flush  10-40 mL Intracatheter Q12H  . vancomycin  125 mg Oral QID  . Warfarin - Pharmacist Dosing Inpatient   Does not apply q1600   Continuous Infusions:  LOS: 7 days   Time spent: 25 minutes.  Patrecia Pour, MD [facility] [email or web address] 09/18/2019, 5:27 PM

## 2019-09-18 NOTE — Progress Notes (Signed)
Zachary Willis is making progress daily now.  He just sounds stronger.  There has been more activity.  He seems to be walking a little bit better.  He says diarrhea is resolved.  He is eating little bit more.  He is taking some solid food in.  He has had no problems with bleeding.  His INR today is 1.8.  This is on the way up.  I think we can probably stop the Lovenox.  His potassium is quite low at 2.9.  I would give him IV potassium.  I am unsure of oral potassium will be all that well tolerated.  His labs show a white cell count of 9.7.  Hemoglobin 9.1.  Platelet count 87,000.  His BUN is 30 creatinine 1.11.  Blood sugar is 252.  He is not complaining of any pain.  There is no fever.  He has had no mouth sores.  There is no rashes.  His vital signs show temperature 97.9.  Pulse 76.  Blood pressure 121/80.  His oxygen saturation on room air is 97%.  His lungs sound clear bilaterally.  He has good breath sounds bilaterally.  Cardiac exam irregular rate and rhythm consistent with atrial fibrillation.  He has a very well controlled rate.  Abdomen does not appear as distended.  Bowel sounds seem more normal.  There is no guarding or rebound tenderness.  Extremities show some chronic mild pitting edema in the legs.  Neurological exam is nonfocal.  I am happy that Zachary Willis is doing better.  He really has made some nice progress this week.  I hate that the potassium is so low.  We will replace the potassium.  I may have to check his magnesium to make sure this is not low.  His Coumadin is becoming therapeutic now.  Hopefully, he will be able to be in a position to go home by the weekend.  I think this would be reasonable for him.  Lattie Haw, MD  Psalm 138:3

## 2019-09-18 NOTE — TOC Progression Note (Deleted)
Transition of Care [facility]) - Progression Note    Patient Details  Name: Zachary Willis MRN: [id number] Date of Birth: 12-18-1945  Transition of [facility] CM/SW Contact  Ross Ludwig, [place] Phone Number: 09/18/2019, 9:32 AM  Clinical Narrative:    Patient has been set up with [facility] for home health services once patient is medically ready for discharge.   Expected Discharge Plan: Home/Self Care Barriers to Discharge: Continued Medical Work up  Expected Discharge Plan and Services Expected Discharge Plan: Home/Self Care   Discharge Planning Services: CM Consult   Living arrangements for the past 2 months: Single Family Home                                       Social Determinants of Health (SDOH) Interventions    Readmission Risk Interventions No flowsheet data found.

## 2019-09-19 LAB — IRON AND TIBC
Iron: 39 ug/dL — ABNORMAL LOW (ref 45–182)
Saturation Ratios: 16 % — ABNORMAL LOW (ref 17.9–39.5)
TIBC: 240 ug/dL — ABNORMAL LOW (ref 250–450)
UIBC: 201 ug/dL

## 2019-09-19 LAB — PROTIME-INR
INR: 2.1 — ABNORMAL HIGH (ref 0.8–1.2)
Prothrombin Time: 22.4 seconds — ABNORMAL HIGH (ref 11.4–15.2)

## 2019-09-19 LAB — BASIC METABOLIC PANEL
Anion gap: 7 (ref 5–15)
BUN: 25 mg/dL — ABNORMAL HIGH (ref 8–23)
CO2: 30 mmol/L (ref 22–32)
Calcium: 7.5 mg/dL — ABNORMAL LOW (ref 8.9–10.3)
Chloride: 100 mmol/L (ref 98–111)
Creatinine, Ser: 0.88 mg/dL (ref 0.61–1.24)
GFR calc Af Amer: >60 mL/min (ref >60)
GFR calc non Af Amer: >60 mL/min (ref >60)
Glucose, Bld: 205 mg/dL — ABNORMAL HIGH (ref 70–99)
Potassium: 3 mmol/L — ABNORMAL LOW (ref 3.5–5.1)
Sodium: 137 mmol/L (ref 135–145)

## 2019-09-19 LAB — CBC
HCT: 28.6 % — ABNORMAL LOW (ref 39.0–52.0)
Hemoglobin: 8.9 g/dL — ABNORMAL LOW (ref 13.0–17.0)
MCH: 30.2 pg (ref 26.0–34.0)
MCHC: 31.1 g/dL (ref 30.0–36.0)
MCV: 96.9 fL (ref 80.0–100.0)
Platelets: 91 10*3/uL — ABNORMAL LOW (ref 150–400)
RBC: 2.95 MIL/uL — ABNORMAL LOW (ref 4.22–5.81)
RDW: 18.6 % — ABNORMAL HIGH (ref 11.5–15.5)
WBC: 8.7 10*3/uL (ref 4.0–10.5)
nRBC: 1.6 % — ABNORMAL HIGH (ref 0.0–0.2)

## 2019-09-19 LAB — GLUCOSE, CAPILLARY
Glucose-Capillary: 172 mg/dL — ABNORMAL HIGH (ref 70–99)
Glucose-Capillary: 191 mg/dL — ABNORMAL HIGH (ref 70–99)
Glucose-Capillary: 226 mg/dL — ABNORMAL HIGH (ref 70–99)
Glucose-Capillary: 250 mg/dL — ABNORMAL HIGH (ref 70–99)

## 2019-09-19 LAB — FERRITIN: Ferritin: 173 ng/mL (ref 24–336)

## 2019-09-19 LAB — MAGNESIUM: Magnesium: 1.7 mg/dL (ref 1.7–2.4)

## 2019-09-19 MED ORDER — HYDROCORTISONE 20 MG PO TABS
20.0000 mg | ORAL_TABLET | Freq: Every day | ORAL | Status: DC
  Administered 2019-09-20: 20 mg via ORAL
  Filled 2019-09-19: qty 1

## 2019-09-19 MED ORDER — POTASSIUM CHLORIDE 10 MEQ/50ML IV SOLN
10.0000 meq | INTRAVENOUS | Status: AC
  Administered 2019-09-19 (×4): 10 meq via INTRAVENOUS
  Filled 2019-09-19 (×4): qty 50

## 2019-09-19 MED ORDER — FUROSEMIDE 40 MG PO TABS
40.0000 mg | ORAL_TABLET | Freq: Every day | ORAL | Status: DC
  Administered 2019-09-20: 40 mg via ORAL
  Filled 2019-09-19: qty 1

## 2019-09-19 MED ORDER — WARFARIN SODIUM 2.5 MG PO TABS
2.5000 mg | ORAL_TABLET | Freq: Once | ORAL | Status: AC
  Administered 2019-09-19: 2.5 mg via ORAL
  Filled 2019-09-19: qty 1

## 2019-09-19 MED ORDER — SACCHAROMYCES BOULARDII 250 MG PO CAPS
250.0000 mg | ORAL_CAPSULE | Freq: Two times a day (BID) | ORAL | Status: DC
  Administered 2019-09-19 – 2019-09-20 (×3): 250 mg via ORAL
  Filled 2019-09-19 (×3): qty 1

## 2019-09-19 NOTE — Progress Notes (Signed)
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   Pt anticipates d/c home in 1-2 days.  Follow Up Recommendations  Home health PT;Supervision/Assistance - 24 hour     Equipment Recommendations  None recommended by PT    Recommendations for Other Services       Precautions / Restrictions Precautions Precautions: Fall    Mobility  Bed Mobility Overal bed mobility: Needs Assistance Bed Mobility: Supine to Sit     Supine to sit: Supervision     General bed mobility comments: increased time and effort  Transfers Overall transfer level: Needs assistance Equipment used: Rolling walker (2 wheeled) Transfers: Sit to/from Stand Sit to Stand: Min guard         General transfer comment: verbal cues for hand placement  Ambulation/Gait Ambulation/Gait assistance: Min guard Gait Distance (Feet): 220 Feet Assistive device: Rolling walker (2 wheeled) Gait Pattern/deviations: Step-through pattern;Decreased stride length;Trunk flexed     General Gait Details: verbal cues for RW positioning, posture; followed with recliner for safety; distance to tolerance   Stairs             Wheelchair Mobility    Modified Rankin (Stroke Patients Only)       Balance                                            Cognition  Arousal/Alertness: Awake/alert Behavior During Therapy: WFL for tasks assessed/performed Overall Cognitive Status: Within Functional Limits for tasks assessed                                        Exercises      General Comments        Pertinent Vitals/Pain Pain Assessment: No/denies pain    Home Living                      Prior Function            PT Goals (current goals can now be found in the care plan section) Progress towards PT goals: Progressing toward goals    Frequency    Min 3X/week      PT Plan Current plan remains appropriate    Co-evaluation              AM-PAC PT "6 Clicks" Mobility   Outcome Measure  Help needed turning from your back to your side while in a flat bed without using bedrails?: None Help needed moving from lying on your back to sitting on the side of a flat bed without using bedrails?: A Little Help needed moving to and from a bed to a chair (including a wheelchair)?: A Little Help needed standing up  from a chair using your arms (e.g., wheelchair or bedside chair)?: A Little Help needed to walk in hospital room?: A Little Help needed climbing 3-5 steps with a railing? : A Little 6 Click Score: 19    End of Session Equipment Utilized During Treatment: Gait belt Activity Tolerance: Patient tolerated treatment well Patient left: with call bell/phone within reach;in chair Nurse Communication: Mobility status PT Visit Diagnosis: Difficulty in walking, not elsewhere classified (R26.2)     Time: [id number] PT Time Calculation (min) (ACUTE ONLY): 16 min  Charges:  $Gait Training: 8-22 mins                    Arlyce Dice, DPT Acute Rehabilitation Services Pager: [phone] Office: [phone]  York Ram E 09/19/2019, 4:22 PM

## 2019-09-19 NOTE — Care Management Important Message (Signed)
Important Message  Patient Details IM Letter given to the Patient Name: Zachary Willis MRN: [id number] Date of Birth: [birth year]   Medicare Important Message Given:  Yes     Kerin Salen 09/19/2019, 11:23 AM

## 2019-09-19 NOTE — Consult Note (Signed)
WOC Nurse Consult Note: Reason for Consult: IV site oozing Wound type: IV site  Pressure Injury POA: NA Measurement:NA Wound bed: three small areas distal to puncture site.  Appears puncture site from IV is oozing serosanguinous fluid. Unclear if this site infiltrated? If so need to monitor distal darkened areas for any acute changes, worsening  Drainage (amount, consistency, odor) serosanguinous  Periwound: intact, this arm is edematous.  Dressing procedure/placement/frequency: Add calcium alginate to absorb excess drainage. Top with foam. Change every 3 days and PRN strike through.   Dr. Bonner Puna at the bedside and aware of plan. Discussed with WTA on the floor Brien Mates.   Discussed POC with patient and WTA Re consult if needed, will not follow at this time. Thanks  Landi Biscardi [employer], RN,CWOCN, CNS, [place] [phone])

## 2019-09-19 NOTE — TOC Progression Note (Signed)
Transition of Care [facility]) - Progression Note    Patient Details  Name: Zachary Willis MRN: [id number] Date of Birth: 03-22-1945  Transition of Care [facility]) CM/SW Contact  Ross Ludwig,  Phone Number: 09/19/2019, 3:55 PM  Clinical Narrative:     CSW spoke to [facility] and they are able to accept patient for home health needs.  CSW to continue to follow patient's progress throughout discharge planning.   Expected Discharge Plan: Home/Self Care Barriers to Discharge: Continued Medical Work up  Expected Discharge Plan and Services Expected Discharge Plan: Home/Self Care   Discharge Planning Services: CM Consult   Living arrangements for the past 2 months: Single Family Home                                       Social Determinants of Health (SDOH) Interventions    Readmission Risk Interventions No flowsheet data found.

## 2019-09-19 NOTE — Progress Notes (Signed)
So far, Zachary Willis seems to be making slow but steady improvement.  He said that he did have an episode of diarrhea this morning.  I would not think this is recurrent C. difficile.  He may benefit from a probiotic.  His INR is 2.1 today.  The Lovenox will be stopped.  He has had no fever.  Is been no obvious bleeding.  He is still moderately anemic.  I will send off an erythropoietin level on him to see if this might not help with the anemia.  I will send off some iron studies again.  He had iron studies about 3 weeks ago.  His appetite is improving.  He has had no nausea or vomiting.  He has had no rashes.  He has had no cough.  His labs today show a white count of 8.7.  Hemoglobin 8.9.  Platelet count 91,000.  His potassium is 3.0.  His magnesium is 1.7.  He got some potassium yesterday.  What to give him some more today.  His vital signs all are stable.  Temperature 97.9.  Pulse 92.  Blood pressure 124/76.  Oxygen saturation is 96% on room air.  His lungs sound clear bilaterally.  I do not hear any wheezing.  He has good air movement bilaterally.  Cardiac exam irregular rate and irregular rhythm consistent with atrial fibrillation.  The rate is well controlled.  Abdomen is soft.  It does not appear to be as distended.  Bowel sounds are present.  There is no guarding or rebound tenderness.  Extremities show some chronic mild pitting edema in the legs.  Skin exam shows no rashes, ecchymoses or petechia.  Neurological exam is nonfocal.  I think Zachary Willis is getting close to being discharged.  His electrolytes and hemoglobin need to be improved a little bit.  He is getting physical therapy daily.  This really is helping.  I know that he is getting fantastic care from all staff up on 4 E.  I very much appreciate all other effort.  Lattie Haw, MD  Philippians 4:13

## 2019-09-19 NOTE — Progress Notes (Signed)
Per recommendation by WOC RN, calcium alginate applied to puncture sites on left antecubital area and topped with foam drsg. Minimal drainage present upon assessment. Discussed treatment plan with caregiver and patient.  Instructed caregiver to change every 3 days or PRN upon discharge from hospital to self care. Daniell Mancinas, Laurel Dimmer, RN

## 2019-09-19 NOTE — Progress Notes (Signed)
PROGRESS NOTE  Zachary Willis  ZOX:[id number] DOB: 05-Feb-1945 DOA: 09/11/2019 PCP: Tonye Becket., MD   Brief Narrative: Zachary Willis is a 74 y.o. 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 Hospitalization also noted for AKI, improving, febrile neutropenia for which neulasta was given, and supratherapeutic INR on admission which has resolved.  Assessment & Plan: Principal Problem:   Shock ([place]) Active Problems:   Colon cancer metastasized to liver ([place])   AKI (acute kidney injury) ([place])   Hypovolemic shock (HCC)   Severe sepsis (HCC)   Atrial fibrillation (HCC)   C. difficile diarrhea   Adrenal insufficiency (HCC)  Shock, septic and hypovolemic due to C. difficile colitis and diarrhea complicated by adrenal insufficiency, POA, resolved.  - Complete oral vancomycin x10 days - Tapering stress steroids further starting tmrw AM.  - Unclear if any benefit of probiotics with CDI, and felt risk of disseminated infection may outweigh this, though with ongoing diarrhea and immunocompetency at this time, agree probiotics can be given.  AKI: Likely ATN in the setting of hypovolemia and shock. Resolved.  Monitor urine output. Change to home lasix po.   Hypokalemia:  - Supplement again today, deescalate lasix dosing, push po intake.  Febrile neutropenia: In setting of chemotherapy, now resolved s/p neulasta  Hypopituitarism s/p pituitary tumor resection:  - Taper hydrocortisone to 20mg  qAM and 10mg  qPM. Reported home dose is 15mg  / 5 mg.  - Continue synthroid - Restart testosterone at discharge.   Ischemic cardiomyopathy, CAD s/p CABG, acute on chronic combined HFrEF: Formal TTE LVEF 40-45%  similar to prior - Continue lasix, change back to 40mg  po daily - Holding coreg with hypotension. BP and HR both now at goal.  Metastatic colon cancer:  - Dr. Marin Olp following. Really appreciate his care.  Chronic atrial fibrillation with RVR: RVR resolved.  - Continue coumadin since supratherapeutic INR has resolved. Can stop lovenox. INR in AM. - Holding coreg for now, rate is controlled.   T2DM: In setting of chronic steroids and decadron with chemo, HbA1c 8.9%.  - Continue SSI. Will monitor CBGs going forward with steroid taper and consider oral Tx at DC.   Anemia of chronic disease: In setting of malignancy.  - Stable, monitor for bleeding. EPO level per heme/onc.   Mild thrombocytopenia: Stable  Obesity: Body mass index is 36.83 kg/m.   DVT prophylaxis: Coumadin Code Status: Full Family Communication: None at bedside Disposition Plan:  Status is: Inpatient  Remains inpatient appropriate because:Inpatient level of care appropriate due to severity of illness  Dispo: The patient is from: Home              Anticipated d/c is to: Home              Anticipated d/c date is: 1 day              Patient currently is not medically stable to d/c.  Consultants:   PCCM  Nephrology  Cardiology  Oncology  Procedures:   None  Antimicrobials:  Vancomycin 9/8 - 9/8  Cefepime 9/8 - 9/9  Acyclovir 9/8 >>  Vancomycin po 9/8 >> 9/18  Subjective: Doing better with PT/OT. Diarrhea remains an issue, having trouble holding to get to  commode. No abd pain. Feels stronger. Had IV site in left [facility] removed, oozing blood a fair amount but has stopped.   Objective: Vitals:   09/18/19 2115 09/18/19 2305 09/19/19 0550 09/19/19 1325  BP: 116/77 118/80 124/76 119/78  Pulse: 81 65 80 69  Resp: 20  18 17   Temp: 97.7 F (36.5 C)  97.9 F (36.6 C) 98.2 F (36.8 C)  TempSrc:   Oral Oral  SpO2: 97%  96% 96%  Weight:   116.4 kg   Height:        Intake/Output Summary (Last  24 hours) at 09/19/2019 1821 Last data filed at 09/19/2019 1740 Gross per 24 hour  Intake 870 ml  Output 3076 ml  Net -2206 ml   Filed Weights   09/17/19 0500 09/18/19 0500 09/19/19 0550  Weight: 118.2 kg 116.5 kg 116.4 kg   Gen: 74 y.o. male in no distress Pulm: Nonlabored breathing. Clear. CV: Regular rate and rhythm. No murmur, rub, or gallop. No JVD, no pitting dependent edema. GI: Abdomen soft, non-tender, non-distended, with normoactive bowel sounds.  Ext: Warm, no deformities Skin: No new rashes, lesions or ulcers on visualized skin. Left AC puncture site hemostatic. Neuro: Alert and oriented. No focal neurological deficits. Psych: Judgement and insight appear fair. Mood euthymic & affect congruent. Behavior is appropriate.    Data Reviewed: I have personally reviewed following labs and imaging studies  CBC: Recent Labs  Lab 09/13/19 0655 09/13/19 1545 09/14/19 0430 09/14/19 0430 09/15/19 0400 09/16/19 0352 09/17/19 0420 09/18/19 0529 09/19/19 0315  WBC 1.8*   < > 2.7*   < > 5.4 8.5 8.9 9.7 8.7  NEUTROABS 1.2*  --  1.6*  --  3.3 5.6  --   --   --   HGB 7.9*   < > 7.3*   < > 7.6* 7.7* 9.1* 9.1* 8.9*  HCT 24.5*   < > 23.3*   < > 23.7* 24.3* 28.5* 28.5* 28.6*  MCV 93.5   < > 95.5   < > 95.2 94.9 95.6 95.6 96.9  PLT 59*   < > 38*   < > 45* 64* 85* 87* 91*   < > = values in this interval not displayed.   Basic Metabolic Panel: Recent Labs  Lab 09/13/19 0500 09/13/19 0500 09/14/19 0430 09/15/19 0400 09/16/19 0352 09/17/19 0420 09/18/19 0529 09/19/19 0315  NA 137   < > 137 136 137  --  136 137  K 3.0*   < > 3.2* 3.6 3.3*  --  2.9* 3.0*  CL 101   < > 102 104 103  --  102 100  CO2 22   < > 23 23 25   --  29 30  GLUCOSE 232*   < > 198* 203* 260*  --  252* 205*  BUN 32*   < > 34* 38* 44*  --  30* 25*  CREATININE 1.29*   < > 1.21 1.15 1.15  --  1.11 0.88  CALCIUM 7.6*   < > 8.1* 8.3* 8.2*  --  7.6* 7.5*  MG 2.0   < > 2.0 2.4 2.2 2.0  --  1.7  PHOS 3.8  --  3.0  2.6 2.8  --   --   --    < > = values in this interval not displayed.   GFR: Estimated Creatinine Clearance: 98.4 mL/min (by C-G formula based on SCr of 0.88 mg/dL). Liver Function Tests: Recent Labs  Lab 09/13/19 0500 09/14/19 0430  09/15/19 0400 09/16/19 0352  AST 24 18 23 25   ALT 28 26 28 29   ALKPHOS 88 92 161* 199*  BILITOT 2.3* 1.7* 1.6* 1.6*  PROT 4.8* 4.8* 5.0* 4.6*  ALBUMIN 2.5* 2.3* 2.3* 2.3*   No results for input(s): LIPASE, AMYLASE in the last 168 hours. No results for input(s): AMMONIA in the last 168 hours. Coagulation Profile: Recent Labs  Lab 09/15/19 0400 09/16/19 0352 09/17/19 0420 09/18/19 0529 09/19/19 0315  INR 1.1 1.2 1.4* 1.8* 2.1*   Cardiac Enzymes: No results for input(s): CKTOTAL, CKMB, CKMBINDEX, TROPONINI in the last 168 hours. BNP (last 3 results) No results for input(s): PROBNP in the last 8760 hours. HbA1C: No results for input(s): HGBA1C in the last 72 hours. CBG: Recent Labs  Lab 09/18/19 1710 09/18/19 2323 09/19/19 0737 09/19/19 1137 09/19/19 1645  GLUCAP 159* 195* 172* 191* 226*   Lipid Profile: No results for input(s): CHOL, HDL, LDLCALC, TRIG, CHOLHDL, LDLDIRECT in the last 72 hours. Thyroid Function Tests: No results for input(s): TSH, T4TOTAL, FREET4, T3FREE, THYROIDAB in the last 72 hours. Anemia Panel: Recent Labs    09/19/19 0911  FERRITIN 173  TIBC 240*  IRON 39*   Urine analysis:    Component Value Date/Time   COLORURINE YELLOW 09/11/2019 [place] 09/11/2019 1052   LABSPEC 1.019 09/11/2019 1052   PHURINE 5.0 09/11/2019 1052   GLUCOSEU NEGATIVE 09/11/2019 1052   HGBUR NEGATIVE 09/11/2019 1052   HGBUR trace-intact 12/28/2009 1550   BILIRUBINUR NEGATIVE 09/11/2019 [place] 09/11/2019 1052   PROTEINUR NEGATIVE 09/11/2019 1052   UROBILINOGEN 0.2 12/28/2009 1550   NITRITE NEGATIVE 09/11/2019 1052   LEUKOCYTESUR NEGATIVE 09/11/2019 1052   Recent Results (from the past 240  hour(s))  Blood Culture (routine x 2)     Status: None   Collection Time: 09/11/19 10:41 AM   Specimen: BLOOD  Result Value Ref Range Status   Specimen Description   Final    BLOOD PORTA CATH Performed at [facility], [place] [address] [place], [place] [address]    Special Requests   Final    BOTTLES DRAWN AEROBIC AND ANAEROBIC Blood Culture adequate volume Performed at [place] [address], [place], [place] [address]    Culture   Final    NO GROWTH 7 DAYS Performed at [facility], [place] [address], [place], [place] [address]    Report Status 09/18/2019 FINAL  Final  Urine culture     Status: None   Collection Time: 09/11/19 10:52 AM   Specimen: In/Out Cath Urine  Result Value Ref Range Status   Specimen Description   Final    IN/OUT CATH URINE Performed at [place] [address] [place], [place] [address]    Special Requests   Final    NONE Performed at [facility], [place] [address] [place], [place] [address]    Culture   Final    NO GROWTH Performed at [facility], [place] [address], [place], [place] [address]    Report Status 09/12/2019 FINAL  Final  C Difficile Quick Screen w PCR reflex     Status: Abnormal   Collection Time: 09/11/19 10:52 AM   Specimen: STOOL  Result Value Ref Range Status   C Diff antigen POSITIVE (A) NEGATIVE Final   C Diff toxin NEGATIVE NEGATIVE Final   C Diff interpretation Results are indeterminate. See PCR results.  Final  Comment: Performed at [facility], [place] [address], [place], [place] [address]  C. Diff by PCR, Reflexed     Status: Abnormal   Collection Time: 09/11/19 10:52 AM  Result Value Ref Range Status   Toxigenic C. Difficile by PCR POSITIVE (A) NEGATIVE Final    Comment: Positive for toxigenic C. difficile with little to no toxin production. Only treat if clinical presentation suggests symptomatic  illness. Performed at [facility], [place] [address] [place], [place] [address]   SARS Coronavirus 2 by RT PCR (hospital order, performed in [facility] hospital lab) Nasopharyngeal Nasopharyngeal Swab     Status: None   Collection Time: 09/11/19 11:56 AM   Specimen: Nasopharyngeal Swab  Result Value Ref Range Status   SARS Coronavirus 2 NEGATIVE NEGATIVE Final    Comment: (NOTE) SARS-CoV-2 target nucleic acids are NOT DETECTED.  The SARS-CoV-2 RNA is generally detectable in upper and lower respiratory specimens during the acute phase of infection. The lowest concentration of SARS-CoV-2 viral copies this assay can detect is 250 copies / mL. A negative result does not preclude SARS-CoV-2 infection and should not be used as the sole basis for treatment or other patient management decisions.  A negative result may occur with improper specimen collection / handling, submission of specimen other than nasopharyngeal swab, presence of viral mutation(s) within the areas targeted by this assay, and inadequate number of viral copies (<250 copies / mL). A negative result must be combined with clinical observations, patient history, and epidemiological information.  Fact Sheet for Patients:   StrictlyIdeas.no  Fact Sheet for Healthcare Providers: BankingDealers.co.za  This test is not yet approved or  cleared by the [place] FDA and has been authorized for detection and/or diagnosis of SARS-CoV-2 by FDA under an Emergency Use Authorization (EUA).  This EUA will remain in effect (meaning this test can be used) for the duration of the COVID-19 declaration under Section 564(b)(1) of the Act, 21 U.S.C. section 360bbb-3(b)(1), unless the authorization is terminated or revoked sooner.  Performed at [facility], [place] [address], [place], [place] [address]   MRSA PCR Screening     Status: None   Collection Time: 09/11/19   5:04 PM   Specimen: Nasopharyngeal  Result Value Ref Range Status   MRSA by PCR NEGATIVE NEGATIVE Final    Comment:        The GeneXpert MRSA Assay (FDA approved for NASAL specimens only), is one component of a comprehensive MRSA colonization surveillance program. It is not intended to diagnose MRSA infection nor to guide or monitor treatment for MRSA infections. Performed at [facility], [place] [address] [place], [place] [address]       Radiology Studies: No results found.  Scheduled Meds: . acyclovir  400 mg Oral BID  . allopurinol  300 mg Oral q morning - 10a  . atorvastatin  40 mg Oral Daily  . Chlorhexidine Gluconate Cloth  6 each Topical Daily  . famotidine  20 mg Oral Daily  . feeding supplement (KATE FARMS STANDARD 1.4)  325 mL Oral BID BM  . [START ON 09/20/2019] furosemide  40 mg Oral Daily  . hydrocortisone  10 mg Oral QPM  . hydrocortisone  30 mg Oral Q breakfast  . insulin aspart  0-15 Units Subcutaneous TID WC  . insulin aspart  0-5 Units Subcutaneous QHS  . levothyroxine  88 mcg Oral QAC breakfast  . mouth rinse  15 mL Mouth Rinse BID  .  multivitamin with minerals  1 tablet Oral Daily  . saccharomyces boulardii  250 mg Oral BID  . simethicone  80 mg Oral QID  . sodium chloride flush  10-40 mL Intracatheter Q12H  . vancomycin  125 mg Oral QID  . Warfarin - Pharmacist Dosing Inpatient   Does not apply q1600   Continuous Infusions:   LOS: 8 days   Time spent: 25 minutes.  Patrecia Pour, MD [facility] [email or web address] 09/19/2019, 6:21 PM

## 2019-09-19 NOTE — Progress Notes (Signed)
[place] for warfarin Indication: atrial fibrillation  No Known Allergies  Patient Measurements: Height: 5\' 10"  (177.8 cm) Weight: 116.4 kg (256 lb 11.2 oz) IBW/kg (Calculated) : 73  Vital Signs: Temp: 97.9 F (36.6 C) (09/16 0550) Temp Source: Oral (09/16 0550) BP: 124/76 (09/16 0550) Pulse Rate: 80 (09/16 0550)  Labs: Recent Labs    09/17/19 0420 09/17/19 0420 09/18/19 0529 09/19/19 0315  HGB 9.1*   < > 9.1* 8.9*  HCT 28.5*  --  28.5* 28.6*  PLT 85*  --  87* 91*  LABPROT 16.4*  --  19.8* 22.4*  INR 1.4*  --  1.8* 2.1*  CREATININE  --   --  1.11 0.88   < > = values in this interval not displayed.    Estimated Creatinine Clearance: 98.4 mL/min (by C-G formula based on SCr of 0.88 mg/dL).   Medications: Warfarin 2.5 mg and 5 mg alternating every other day PTA INR on admission: 8.7  Assessment: Pt is a 50 yoM on warfarin PTA for history of atrial fibrillation. PMH significant for metastatic colon cancer.  Pt admitted with mutifactorial shock (septic, hypovolemic), currently on PO vancomycin for c.diff.    Significant Events: -9/8: INR 8.7, metronidazole dose given -9/9: INR >10, no bleeding. Vitamin K 2.5 + 10 mg IV; FFP x 2 -9/10: INR 1.8 s/p vitamin K -9/11: Resume warfarin dosing -9/13: PRBC x 1, Lovenox bridge per Onc (no bleeding noted; Onc expects slow Hgb recovery given DM and Chemo) -9/16: INR therapeutic, LMWH discontinued  Today, 09/19/19  INR = 2.1 is therapeutic today  CBC: Hgb/Plt low but stable. No bleeding reported.  Pt received chemotherapy on 8/31, likely contributing to low counts  Diet: Heart healthy, carb modified. 80% meal intake charted.  No significant DDI  Goal of Therapy:  INR 2-3 Monitor platelets by anticoagulation protocol: Yes   Plan:   Enoxaparin discontinued per oncology  Warfarin 2.5 mg PO today - suspect lingering effects from VitK, and poor PO intake until recently, so would  not dose aggressively  For discharge, would resume PTA dosing (above) with INR recheck in 2-3 days  INR daily  Follow for signs/symptoms of bleeding  Lenis Noon, PharmD 09/19/19 10:02 AM

## 2019-09-20 ENCOUNTER — Telehealth: Payer: Self-pay | Admitting: Hematology & Oncology

## 2019-09-20 LAB — BASIC METABOLIC PANEL
Anion gap: 6 (ref 5–15)
BUN: 22 mg/dL (ref 8–23)
CO2: 31 mmol/L (ref 22–32)
Calcium: 7.7 mg/dL — ABNORMAL LOW (ref 8.9–10.3)
Chloride: 100 mmol/L (ref 98–111)
Creatinine, Ser: 0.85 mg/dL (ref 0.61–1.24)
GFR calc Af Amer: >60 mL/min (ref >60)
GFR calc non Af Amer: >60 mL/min (ref >60)
Glucose, Bld: 229 mg/dL — ABNORMAL HIGH (ref 70–99)
Potassium: 3.1 mmol/L — ABNORMAL LOW (ref 3.5–5.1)
Sodium: 137 mmol/L (ref 135–145)

## 2019-09-20 LAB — CBC
HCT: 28 % — ABNORMAL LOW (ref 39.0–52.0)
Hemoglobin: 8.7 g/dL — ABNORMAL LOW (ref 13.0–17.0)
MCH: 30.4 pg (ref 26.0–34.0)
MCHC: 31.1 g/dL (ref 30.0–36.0)
MCV: 97.9 fL (ref 80.0–100.0)
Platelets: 100 10*3/uL — ABNORMAL LOW (ref 150–400)
RBC: 2.86 MIL/uL — ABNORMAL LOW (ref 4.22–5.81)
RDW: 19 % — ABNORMAL HIGH (ref 11.5–15.5)
WBC: 8.2 10*3/uL (ref 4.0–10.5)
nRBC: 1.5 % — ABNORMAL HIGH (ref 0.0–0.2)

## 2019-09-20 LAB — PROTIME-INR
INR: 2.4 — ABNORMAL HIGH (ref 0.8–1.2)
Prothrombin Time: 25.1 seconds — ABNORMAL HIGH (ref 11.4–15.2)

## 2019-09-20 LAB — ERYTHROPOIETIN: Erythropoietin: 90.1 m[IU]/mL — ABNORMAL HIGH (ref 2.6–18.5)

## 2019-09-20 LAB — GLUCOSE, CAPILLARY
Glucose-Capillary: 181 mg/dL — ABNORMAL HIGH (ref 70–99)
Glucose-Capillary: 225 mg/dL — ABNORMAL HIGH (ref 70–99)
Glucose-Capillary: 235 mg/dL — ABNORMAL HIGH (ref 70–99)

## 2019-09-20 MED ORDER — POTASSIUM CHLORIDE 10 MEQ/50ML IV SOLN
10.0000 meq | INTRAVENOUS | Status: AC
  Administered 2019-09-20 (×6): 10 meq via INTRAVENOUS
  Filled 2019-09-20 (×6): qty 50

## 2019-09-20 MED ORDER — HEPARIN SOD (PORK) LOCK FLUSH 100 UNIT/ML IV SOLN
500.0000 [IU] | INTRAVENOUS | Status: AC | PRN
  Administered 2019-09-20: 500 [IU]
  Filled 2019-09-20: qty 5

## 2019-09-20 MED ORDER — WARFARIN SODIUM 5 MG PO TABS
5.0000 mg | ORAL_TABLET | Freq: Once | ORAL | Status: AC
  Administered 2019-09-20: 5 mg via ORAL
  Filled 2019-09-20: qty 1

## 2019-09-20 MED ORDER — SODIUM CHLORIDE 0.9 % IV SOLN
510.0000 mg | Freq: Once | INTRAVENOUS | Status: AC
  Administered 2019-09-20: 510 mg via INTRAVENOUS
  Filled 2019-09-20: qty 510

## 2019-09-20 MED ORDER — SACCHAROMYCES BOULARDII 250 MG PO CAPS
250.0000 mg | ORAL_CAPSULE | Freq: Two times a day (BID) | ORAL | 0 refills | Status: AC
Start: 2019-09-20 — End: 2019-10-04

## 2019-09-20 MED ORDER — HYDROCORTISONE 10 MG PO TABS: 10.0000 mg | ORAL_TABLET | ORAL | 4 refills | Status: DC

## 2019-09-20 NOTE — Discharge Summary (Signed)
Physician Discharge Summary  Zachary Willis RCV:[id number] DOB: 10/12/1945 DOA: 09/11/2019  PCP: Tonye Becket., MD  Admit date: 09/11/2019 Discharge date: 09/20/2019  Admitted From: Home Disposition: Home   Recommendations for Outpatient Follow-up:  1. Follow up with oncology, Dr. Marin Olp as scheduled 9/28 2. Repeat BMP to monitor potassium, CBC and anemia panel to monitor anemia. Continuing iron.  3. Follow up with PCP in 1-2 weeks, note Hb1c 8.9%.  Home Health: PT, RN Equipment/Devices: None new Discharge Condition: Stable CODE STATUS: Full Diet recommendation: Heart healthy  Brief/Interim Summary: Zachary Willis is a 74 y.o. 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 Hospitalization also noted for AKI, improving, febrile neutropenia for which neulasta was given, and supratherapeutic INR on admission which has resolved. He has completed treatment with oral vancomycin with significant improvement in diarrhea, defervesced, and demonstrated significant functional improvements.   Discharge Diagnoses:  Principal Problem:   Shock ([place]) Active Problems:   Colon cancer metastasized to liver ([place])   AKI (acute kidney injury) ([place])   Hypovolemic shock (H[facility])   Severe sepsis (HCC)   Atrial fibrillation (HCC)   C. difficile diarrhea   Adrenal insufficiency (HCC)  Shock, septic and hypovolemic due to C. difficile colitis and diarrhea complicated by adrenal insufficiency, POA, resolved.  - Completed oral vancomycin x10 days (9/8 - 9/17) - Tapered stress steroids  - Unclear if any benefit of probiotics with CDI, and felt risk of disseminated infection may outweigh this, though with ongoing diarrhea  and immunocompetency at this time, agree probiotics can be given.  AKI: Likely ATN in the setting of hypovolemia and shock. Resolved.   Hypokalemia:  - Supplement again today. Will improve with picking up po intake, decreased diuretic intensity, and daily supplement at home. Recheck at follow up  Febrile neutropenia: In setting of chemotherapy, now resolved s/p neulasta  Hypopituitarism s/p pituitary tumor resection:  - Taper hydrocortisone to 20mg  qAM and 10mg  qPM. Follow up with [facility]. - Continue synthroid - Restart testosterone at discharge.   Ischemic cardiomyopathy, CAD s/p CABG, acute on chronic combined HFrEF: Formal TTE LVEF 40-45% similar to prior - Continue lasix, changed back to 40mg  po daily, and home meds.  Metastatic colon cancer:  - Dr. Marin Olp followed throughout, will follow up after discharge.  Chronic atrial fibrillation with RVR: RVR resolved.  - Continue coumadin, coreg  Supratherapeutic INR: Resolved.   T2DM: In setting of chronic steroids and decadron with chemo, HbA1c 8.9%.  - Pt and wife wish to discharge without directed treatment at this time. Urge PCP follow up.   Anemia of chronic disease: In setting of malignancy.  - Received IV iron on day of discharge, continue po iron.  Mild thrombocytopenia: Stable  Obesity: Body mass index is 36.83 kg/m.   Discharge Instructions Discharge Instructions    Diet - low sodium heart healthy   Complete by: As directed    Discharge instructions   Complete by: As directed    You have completed antibiotics for C diff infection but should continue probiotics for 2 more weeks (sent to your pharmacy).  - Continue iron, you received IV iron as an inpatient. - Continue taking coumadin as you were - Continue taking potassium as you were -  Follow up with Dr. Marin Olp 9/21 as scheduled or seek medical attention sooner if you experience worsening diarrhea, abdominal pain, trouble breathing or chest pain, or  fever or change in mental status.  - You will need repeat labs at follow up.   Leave dressing on - Keep it clean, dry, and intact until clinic visit   Complete by: As directed      Allergies as of 09/20/2019   No Known Allergies     Medication List    TAKE these medications   acyclovir 400 MG tablet Commonly known as: ZOVIRAX TAKE 1 TABLET BY MOUTH TWICE A DAY   allopurinol 300 MG tablet Commonly known as: ZYLOPRIM Take 300 mg by mouth every morning.   atorvastatin 40 MG tablet Commonly known as: LIPITOR Take 40 mg by mouth daily.   carvedilol 6.25 MG tablet Commonly known as: COREG Take 6.25 mg by mouth 2 (two) times daily with a meal.   [employer] Aspirin Adult Low Dose 81 MG chewable tablet Generic drug: aspirin Chew 81 mg by mouth daily.   dexamethasone 4 MG tablet Commonly known as: DECADRON Take 2 tablets (8 mg total) by mouth daily. Start the day after chemo for 2 days.   ferrous sulfate 325 (65 FE) MG tablet Take 325 mg by mouth daily.   furosemide 40 MG tablet Commonly known as: LASIX Take 40 mg by mouth daily.   hydrocortisone 10 MG tablet Commonly known as: CORTEF Take 1-2 tablets (10-20 mg total) by mouth See admin instructions. 20mg  in AM 10mg  in PM What changed:   how much to take  additional instructions   ibuprofen 200 MG tablet Commonly known as: ADVIL Take 400 mg by mouth every 6 (six) hours as needed for headache or mild pain.   Klor-Con 10 10 MEQ tablet Generic drug: potassium chloride Take 20 mEq by mouth daily.   levothyroxine 88 MCG tablet Commonly known as: SYNTHROID Take 88 mcg by mouth daily before breakfast.   lidocaine-prilocaine cream Commonly known as: EMLA Apply to affected area once What changed:   how much to take  how to take this  when to take this  reasons to take this   loperamide 2 MG tablet Commonly known as: Imodium A-D Take 1 tablet (2 mg total) by mouth 4 (four) times daily as needed. Take 2 at diarrhea  onset , then 1 every 2hr until 12hrs with no BM. May take 2 every 4hrs at night. If diarrhea recurs repeat. What changed: reasons to take this   NASAL [place] NA Place 1 spray into the nose daily.   ondansetron 8 MG tablet Commonly known as: ZOFRAN Take 1 tablet (8 mg total) by mouth 2 (two) times daily as needed for refractory nausea / vomiting. Start on day 3 after chemotherapy.   pantoprazole 40 MG tablet Commonly known as: PROTONIX Take 40 mg by mouth every morning.   polyvinyl alcohol 1.4 % ophthalmic solution Commonly known as: LIQUIFILM TEARS Place 1 drop into both eyes daily as needed for dry eyes.   prochlorperazine 10 MG tablet Commonly known as: COMPAZINE Take 1 tablet (10 mg total) by mouth every 6 (six) hours as needed (NAUSEA).   saccharomyces boulardii 250 MG capsule Commonly known as: FLORASTOR Take 1 capsule (250 mg total) by mouth 2 (two) times daily for 14 days.   testosterone 4 MG/24HR Pt24 patch Commonly known as: ANDRODERM Place 1 patch onto the skin daily.   warfarin 5 MG tablet Commonly known  as: COUMADIN Take 2.5-5 mg by mouth See admin instructions. 5mg  alternating with 2.5mg  every other day            Discharge Care Instructions  (From admission, onward)         Start     Ordered   09/20/19 0000  Leave dressing on - Keep it clean, dry, and intact until clinic visit        09/20/19 1002          Follow-up Information    Tonye Becket., MD. Schedule an appointment as soon as possible for a visit in 1 week(s).   Specialty: Family Medicine Contact information: [place] [phone] [phone]        [name], MD. Go on 10/01/2019.   Specialty: Oncology Why: 9:00am Contact information: [address] [place] [address] [phone]              No Known Allergies  Consultations:  [facility]  Nephrology  Cardiology  PCCM  Oncology, Dr.  Marin Olp  Procedures/Studies: CT Abdomen Pelvis W Contrast  Result Date: 09/11/2019 CLINICAL DATA:  Nausea, vomiting, and diarrhea for 3 days. Fever. Diverticulitis suspected. History of colon cancer metastasized to liver. EXAM: CT ABDOMEN AND PELVIS WITH CONTRAST TECHNIQUE: Multidetector CT imaging of the abdomen and pelvis was performed using the standard protocol following bolus administration of intravenous contrast. CONTRAST:  185mL OMNIPAQUE IOHEXOL 300 MG/ML  SOLN COMPARISON:  CT 10 days ago 09/02/2019 FINDINGS: Lower chest: Trace pleural effusions and compressive atelectasis, new from recent CT. Right middle and lower lobe pulmonary nodules are stable in the short interim. Coronary artery calcifications. Upper normal heart size. Hepatobiliary: Postsurgical change in the liver. Nodular contours. Hypodense lesion in the subcapsular right lobe measures 3.9 x 3.0 cm, unchanged allowing for differences in caliper placement. No new hepatic abnormality cholecystectomy without biliary dilatation. Pancreas: Fatty atrophy.  No ductal dilatation or inflammation. Spleen: Splenomegaly with maximal coronal dimension 13.8 cm on the current exam. No focal abnormality. Adrenals/Urinary Tract: Normal adrenal glands without nodule. No hydronephrosis. Similar symmetric perinephric edema. Mild bilateral renal parenchymal thinning. Unchanged simple cyst in the mid left kidney. No visualized renal calculi. Again seen thickening of the urinary bladder which appears chronic and is likely related to chronic bladder outlet obstruction. Stomach/Bowel: Fluid within the stomach with chronic mild submucosal fatty deposition. No duodenal inflammation. Proximal small bowel are normal. Mid small bowel are fluid-filled but nondilated. There is a moderate length segment of small bowel wall thickening involving the jejunal bowel loops to the neoterminal ileum at the ileocolic junction. Loop of small bowel enters a upper abdominal hernia but  no evidence of proximal obstruction. The degree of small bowel wall thickening of the herniated bowel segment is similar to that of the non herniated segments, and felt to be related to the underlying diffuse process rather than incarceration. Enteric sutures noted in the distal small bowel. The remaining in situ colon is fluid-filled without wall thickening. There is mild diffuse mesenteric edema. No focal diverticulitis. No bowel pneumatosis. Vascular/Lymphatic: Aortic atherosclerosis. No aortic aneurysm. The portal vein is patent. Prior left hepatic artery embolization. There are no acute vascular findings. No bulky abdominopelvic adenopathy. Reproductive: Choose. Right seminal vesicle is hypoplastic or atrophic. Other: There is mild generalized mesenteric edema with trace free fluid in the pericolic gutters, mesentery, and pelvis. No free air or organized collection. Upper abdominal hernia contains short segment small bowel. There  are multiple additional small fat containing supraumbilical and umbilical ventral abdominal wall hernias. Musculoskeletal: No acute osseous abnormalities or change from prior exam. Degenerative change in the spine. IMPRESSION: 1. Moderate length segment of small bowel wall thickening involving the jejunal bowel loops to the neoterminal ileum at the ileocolic junction. Findings most consistent with enteritis. The remainder of the bowel is fluid-filled but noninflamed, in keeping with diarrheal process. 2. Upper abdominal ventral abdominal wall hernia contains short segment small bowel, but no proximal obstruction. Multiple additional small fat containing supraumbilical and umbilical ventral abdominal wall hernias, stable. 3. Grossly unchanged hypodense lesion in the subcapsular right lobe of the liver. 4. Chronic urinary bladder thickening, likely related to chronic bladder outlet obstruction. 5. Trace pleural effusions and compressive atelectasis, new from recent CT. Aortic  Atherosclerosis (ICD10-I70.0). Electronically Signed   By: Keith Rake M.D.   On: 09/11/2019 16:27   CT CHEST ABDOMEN PELVIS W CONTRAST  Result Date: 09/02/2019 CLINICAL DATA:  Metastatic colon cancer restaging EXAM: CT CHEST, ABDOMEN, AND PELVIS WITH CONTRAST TECHNIQUE: Multidetector CT imaging of the chest, abdomen and pelvis was performed following the standard protocol during bolus administration of intravenous contrast. CONTRAST:  163mL OMNIPAQUE IOHEXOL 300 MG/ML SOLN, additional oral enteric contrast COMPARISON:  05/28/2019 FINDINGS: CT CHEST FINDINGS Cardiovascular: Right chest port catheter. Aortic atherosclerosis. Normal heart size. Extensive 3 vessel coronary artery calcification and/or stents. No pericardial effusion. Mediastinum/Nodes: No enlarged mediastinal, hilar, or axillary lymph nodes. Redemonstrated enlarged, heterogeneous left lobe of the thyroid. In the setting of significant comorbidities or limited life expectancy, no follow-up recommended (ref: J Am Coll Radiol. 2015 Feb;12(2): 143-50). Trachea, and esophagus demonstrate no significant findings. Lungs/Pleura: Diffuse bilateral bronchial wall thickening. There are numerous small bilateral pulmonary nodules, the majority of which demonstrate no significant interval change, the largest in the medial right upper lobe measuring 9 mm (series 4, image 54). Some nodules do however appear to have minimally increased in size for example a 6 mm nodule of the lingula which previously measured 4-5 mm (series 4, image 96). No pleural effusion or pneumothorax. Musculoskeletal: Unchanged sclerotic focus of the lateral right eighth rib (series 2, image 50) CT ABDOMEN PELVIS FINDINGS Hepatobiliary: Redemonstrated cirrhotic morphology of the liver. Unchanged ill-defined hypodensity of the peripheral right lobe of the liver, hepatic segment VI, measuring approximately 4.0 x 3.6 cm (series 2, image 59). Status post cholecystectomy. No biliary  dilatation. Pancreas: Unremarkable. No pancreatic ductal dilatation or surrounding inflammatory changes. Spleen: Splenomegaly, maximum coronal span 16.6 cm. Adrenals/Urinary Tract: Adrenal glands are unremarkable. Kidneys are normal, without renal calculi, solid lesion, or hydronephrosis. Simple, exophytic cyst of the midportion of the left kidney. Thickening of the decompressed urinary bladder, likely secondary to chronic outlet obstruction. Stomach/Bowel: Stomach is within normal limits. Status post right hemicolectomy. Vascular/Lymphatic: No significant vascular findings are present. No enlarged abdominal or pelvic lymph nodes. Reproductive: No mass or other abnormality. Other: No abdominal wall hernia or abnormality. No abdominopelvic ascites. Musculoskeletal: No acute or significant osseous findings. IMPRESSION: 1. There are numerous small bilateral pulmonary nodules, the majority of which demonstrate no significant interval change, the majority of which demonstrate no significant interval change. Some nodules do however appear to have minimally increased in size. Findings are consistent with stable to minimally worsened pulmonary metastatic disease. 2. Unchanged ill-defined hypodensity of the peripheral right lobe of the liver, consistent with treated metastasis. No new hepatic metastatic lesions appreciated. 3. Unchanged sclerotic focus of the lateral right eighth rib, nonspecific and indeterminate  for osseous metastatic disease. Attention on follow-up. 4. Redemonstrated cirrhotic morphology of the liver and splenomegaly. 5. Coronary artery disease.  Aortic Atherosclerosis (ICD10-I70.0). Electronically Signed   By: Eddie Candle M.D.   On: 09/02/2019 12:16   DG Chest Port 1 View  Result Date: 09/11/2019 CLINICAL DATA:  Questionable sepsis. EXAM: PORTABLE CHEST 1 VIEW COMPARISON:  June 11, 2019 FINDINGS: RIGHT-sided Port-A-Cath terminates at the caval to atrial junction as before. Cardiomediastinal contours  are stable. Changes of median sternotomy as before. Lungs are clear. No acute skeletal process to the extent evaluated. No sign of effusion on frontal view. RIGHT humeral intraosseous access. IMPRESSION: 1. No active cardiopulmonary disease. 2. Port-A-Cath and suspected RIGHT humeral intraosseous access. Electronically Signed   By: Zetta Bills M.D.   On: 09/11/2019 11:39   ECHOCARDIOGRAM COMPLETE  Result Date: 09/12/2019    ECHOCARDIOGRAM REPORT   Patient Name:   MAKYA YURKO Date of Exam: 09/12/2019 Medical Rec #:  [id number]    Height:       70.0 in Accession #:    [id number]   Weight:       252.2 lb Date of Birth:  Jul 07, 1945    BSA:          2.304 m Patient Age:    74 years     BP:           94/61 mmHg Patient Gender: M            HR:           102 bpm. Exam Location:  Inpatient Procedure: 2D Echo Indications:    Cardiomyopathy-Ischemic $14.8/I25.5  History:        Patient has no prior history of Echocardiogram examinations.                 Arrythmias:Atrial Fibrillation; Risk Factors:Hypertension.  Sonographer:    Mikki Santee RDCS (AE) Referring Phys: [place]  1. Left ventricular ejection fraction, by estimation, is 40 to 45%. The left ventricle has mildly decreased function. The left ventricle demonstrates regional wall motion abnormalities (see scoring diagram/findings for description). Left ventricular diastolic function could not be evaluated. There is dyskinesis of the left ventricular, mid-apical anterolateral wall and inferior segment.  2. Right ventricular systolic function was not well visualized. The right ventricular size is not well visualized. There is normal pulmonary artery systolic pressure.  3. Left atrial size was moderately dilated.  4. A small pericardial effusion is present. The pericardial effusion is circumferential.  5. The mitral valve is grossly normal. Trivial mitral valve regurgitation. No evidence of mitral stenosis.  6. The aortic valve is  tricuspid. There is mild calcification of the aortic valve. There is mild thickening of the aortic valve. Aortic valve regurgitation is not visualized. Comparison(s): No prior Echocardiogram. Conclusion(s)/Recommendation(s): Reduced EF with focal wall motion abnormalities as noted. FINDINGS  Left Ventricle: Left ventricular ejection fraction, by estimation, is 40 to 45%. The left ventricle has mildly decreased function. The left ventricle demonstrates regional wall motion abnormalities. The left ventricular internal cavity size was normal in size. There is no left ventricular hypertrophy. Left ventricular diastolic function could not be evaluated due to atrial fibrillation. Left ventricular diastolic function could not be evaluated. Right Ventricle: The right ventricular size is not well visualized. Right vetricular wall thickness was not assessed. Right ventricular systolic function was not well visualized. There is normal pulmonary artery systolic pressure. The tricuspid regurgitant velocity is 1.95 m/s, and with  an assumed right atrial pressure of 3 mmHg, the estimated right ventricular systolic pressure is 10.1 mmHg. Left Atrium: Left atrial size was moderately dilated. Right Atrium: Right atrial size was normal in size. Pericardium: A small pericardial effusion is present. The pericardial effusion is circumferential. Presence of pericardial fat pad. Mitral Valve: The mitral valve is grossly normal. Trivial mitral valve regurgitation. No evidence of mitral valve stenosis. Tricuspid Valve: The tricuspid valve is normal in structure. Tricuspid valve regurgitation is trivial. No evidence of tricuspid stenosis. Aortic Valve: The aortic valve is tricuspid. There is mild calcification of the aortic valve. There is mild thickening of the aortic valve. Aortic valve regurgitation is not visualized. Pulmonic Valve: The pulmonic valve was grossly normal. Pulmonic valve regurgitation is trivial. Aorta: The aortic root and  ascending aorta are structurally normal, with no evidence of dilitation. IAS/Shunts: The atrial septum is grossly normal.  LEFT VENTRICLE PLAX 2D LVIDd:         4.60 cm LVIDs:         3.60 cm LV PW:         0.90 cm LV IVS:        1.30 cm LVOT diam:     2.60 cm LVOT Area:     5.31 cm  LEFT ATRIUM           Index       RIGHT ATRIUM           Index LA diam:      4.50 cm 1.95 cm/m  RA Area:     14.60 cm LA Vol (A2C): 59.1 ml 25.66 ml/m RA Volume:   29.80 ml  12.94 ml/m LA Vol (A4C): 84.3 ml 36.60 ml/m   AORTA Ao Root diam: 3.50 cm TRICUSPID VALVE TR Peak grad:   15.2 mmHg TR Vmax:        195.00 cm/s  SHUNTS Systemic Diam: 2.60 cm Buford Dresser MD Electronically signed by Buford Dresser MD Signature Date/Time: 09/12/2019/5:59:23 PM    Final    Subjective: Feels stronger everyday. Eating better, not skipping any meals. Stools are decreased frequency and soft without watery component. Denies abd pain, N/V.   Discharge Exam: Vitals:   09/20/19 0517 09/20/19 1320  BP: 122/83 130/79  Pulse: 68 61  Resp: 20 18  Temp: 97.8 F (36.6 C) 98.3 F (36.8 C)  SpO2: 99% 97%   General: Pt is alert, awake, not in acute distress Cardiovascular: RRR, S1/S2 +, no rubs, no gallops Respiratory: CTA bilaterally, no wheezing, no rhonchi Abdominal: Soft, NT, ND, bowel sounds + Extremities: Mild edema, no cyanosis  Labs: BNP (last 3 results) Recent Labs    09/11/19 1052 09/11/19 1654  BNP 113.9* 751.0*   Basic Metabolic Panel: Recent Labs  Lab 09/14/19 0430 09/14/19 0430 09/15/19 0400 09/16/19 0352 09/17/19 0420 09/18/19 0529 09/19/19 0315 09/20/19 0440  NA 137   < > 136 137  --  136 137 137  K 3.2*   < > 3.6 3.3*  --  2.9* 3.0* 3.1*  CL 102   < > 104 103  --  102 100 100  CO2 23   < > 23 25  --  29 30 31   GLUCOSE 198*   < > 203* 260*  --  252* 205* 229*  BUN 34*   < > 38* 44*  --  30* 25* 22  CREATININE 1.21   < > 1.15 1.15  --  1.11 0.88 0.85  CALCIUM 8.1*   < >  8.3* 8.2*  --   7.6* 7.5* 7.7*  MG 2.0  --  2.4 2.2 2.0  --  1.7  --   PHOS 3.0  --  2.6 2.8  --   --   --   --    < > = values in this interval not displayed.   Liver Function Tests: Recent Labs  Lab 09/14/19 0430 09/15/19 0400 09/16/19 0352  AST 18 23 25   ALT 26 28 29   ALKPHOS 92 161* 199*  BILITOT 1.7* 1.6* 1.6*  PROT 4.8* 5.0* 4.6*  ALBUMIN 2.3* 2.3* 2.3*   No results for input(s): LIPASE, AMYLASE in the last 168 hours. No results for input(s): AMMONIA in the last 168 hours. CBC: Recent Labs  Lab 09/14/19 0430 09/14/19 0430 09/15/19 0400 09/15/19 0400 09/16/19 0352 09/17/19 0420 09/18/19 0529 09/19/19 0315 09/20/19 0440  WBC 2.7*   < > 5.4   < > 8.5 8.9 9.7 8.7 8.2  NEUTROABS 1.6*  --  3.3  --  5.6  --   --   --   --   HGB 7.3*   < > 7.6*   < > 7.7* 9.1* 9.1* 8.9* 8.7*  HCT 23.3*   < > 23.7*   < > 24.3* 28.5* 28.5* 28.6* 28.0*  MCV 95.5   < > 95.2   < > 94.9 95.6 95.6 96.9 97.9  PLT 38*   < > 45*   < > 64* 85* 87* 91* 100*   < > = values in this interval not displayed.   Cardiac Enzymes: No results for input(s): CKTOTAL, CKMB, CKMBINDEX, TROPONINI in the last 168 hours. BNP: Invalid input(s): POCBNP CBG: Recent Labs  Lab 09/19/19 1137 09/19/19 1645 09/19/19 2037 09/20/19 0751 09/20/19 1318  GLUCAP 191* 226* 250* 181* 225*   D-Dimer No results for input(s): DDIMER in the last 72 hours. Hgb A1c No results for input(s): HGBA1C in the last 72 hours. Lipid Profile No results for input(s): CHOL, HDL, LDLCALC, TRIG, CHOLHDL, LDLDIRECT in the last 72 hours. Thyroid function studies No results for input(s): TSH, T4TOTAL, T3FREE, THYROIDAB in the last 72 hours.  Invalid input(s): FREET3 Anemia work up Recent Labs    09/19/19 0911  FERRITIN 173  TIBC 240*  IRON 39*   Urinalysis    Component Value Date/Time   COLORURINE YELLOW 09/11/2019 [place] 09/11/2019 1052   LABSPEC 1.019 09/11/2019 1052   PHURINE 5.0 09/11/2019 1052   GLUCOSEU NEGATIVE  09/11/2019 1052   HGBUR NEGATIVE 09/11/2019 1052   HGBUR trace-intact 12/28/2009 [place] 09/11/2019 [place] 09/11/2019 1052   PROTEINUR NEGATIVE 09/11/2019 1052   UROBILINOGEN 0.2 12/28/2009 1550   NITRITE NEGATIVE 09/11/2019 [place] 09/11/2019 1052    Microbiology Recent Results (from the past 240 hour(s))  Blood Culture (routine x 2)     Status: None   Collection Time: 09/11/19 10:41 AM   Specimen: BLOOD  Result Value Ref Range Status   Specimen Description   Final    BLOOD PORTA CATH Performed at [facility], [place] [address], [place], [place] [address]    Special Requests   Final    BOTTLES DRAWN AEROBIC AND ANAEROBIC Blood Culture adequate volume Performed at [facility] [address], [place], [place] [address]    Culture   Final    NO GROWTH 7 DAYS Performed at [facility], [place]  [address] [place], [place] [address]    Report Status 09/18/2019 FINAL  Final  Urine culture     Status: None   Collection Time: 09/11/19 10:52 AM   Specimen: In/Out Cath Urine  Result Value Ref Range Status   Specimen Description   Final    IN/OUT CATH URINE Performed at [facility] [address] [place], [place] [address]    Special Requests   Final    NONE Performed at [facility], [place] [address], [place], [place] [address]    Culture   Final    NO GROWTH Performed at [facility], [place] [address], [place], [place] [address]    Report Status 09/12/2019 FINAL  Final  C Difficile Quick Screen w PCR reflex     Status: Abnormal   Collection Time: 09/11/19 10:52 AM   Specimen: STOOL  Result Value Ref Range Status   C Diff antigen POSITIVE (A) NEGATIVE Final   C Diff toxin NEGATIVE NEGATIVE Final   C Diff interpretation Results are indeterminate. See PCR results.  Final    Comment: Performed at [facility], [place] [address] [place], [place] [address]  C. Diff by PCR, Reflexed     Status: Abnormal   Collection Time: 09/11/19 10:52 AM  Result Value Ref Range Status   Toxigenic C. Difficile by PCR POSITIVE (A) NEGATIVE Final    Comment: Positive for toxigenic C. difficile with little to no toxin production. Only treat if clinical presentation suggests symptomatic illness. Performed at [facility], [place] [address] [place], [place] [address]   SARS Coronavirus 2 by RT PCR (hospital order, performed in [facility] hospital lab) Nasopharyngeal Nasopharyngeal Swab     Status: None   Collection Time: 09/11/19 11:56 AM   Specimen: Nasopharyngeal Swab  Result Value Ref Range Status   SARS Coronavirus 2 NEGATIVE NEGATIVE Final    Comment: (NOTE) SARS-CoV-2 target nucleic acids are NOT DETECTED.  The SARS-CoV-2 RNA is generally detectable in upper and lower respiratory specimens during the acute phase of infection. The lowest concentration of SARS-CoV-2 viral copies this assay can detect is 250 copies / mL. A negative result does not preclude SARS-CoV-2 infection and should not be used as the sole basis for treatment or other patient management decisions.  A negative result may occur with improper specimen collection / handling, submission of specimen other than nasopharyngeal swab, presence of viral mutation(s) within the areas targeted by this assay, and inadequate number of viral copies (<250 copies / mL). A negative result must be combined with clinical observations, patient history, and epidemiological information.  Fact Sheet for Patients:   StrictlyIdeas.no  Fact Sheet for Healthcare Providers: BankingDealers.co.za  This test is not yet approved or  cleared by the [place] FDA and has been authorized for detection and/or diagnosis of SARS-CoV-2 by FDA under an Emergency Use Authorization (EUA).  This EUA will remain in effect (meaning  this test can be used) for the duration of the COVID-19 declaration under Section 564(b)(1) of the Act, 21 U.S.C. section 360bbb-3(b)(1), unless the authorization is terminated or revoked sooner.  Performed at [facility], [place] [address] [place], [place] [address]   MRSA PCR Screening     Status: None   Collection Time: 09/11/19  5:04 PM   Specimen: Nasopharyngeal  Result Value Ref Range Status   MRSA by PCR NEGATIVE NEGATIVE Final    Comment:  The GeneXpert MRSA Assay (FDA approved for NASAL specimens only), is one component of a comprehensive MRSA colonization surveillance program. It is not intended to diagnose MRSA infection nor to guide or monitor treatment for MRSA infections. Performed at [facility], [place] [address] [place], [place] [address]     Time coordinating discharge: Approximately 40 minutes  Patrecia Pour, MD  [facility] 09/20/2019, 4:11 PM

## 2019-09-20 NOTE — Progress Notes (Signed)
Mr. Stockinger continues to make progress.  He is walking better.  He is more active with physical therapy.   He only had 1 bowel movement yesterday.  It was loose but not diarrhea.  His iron is on the low side.  His iron saturation is only 16%.  We will go ahead and give him a dose of IV iron.  I would not put him on oral iron at this might be too difficult for his intestinal tract.  His potassium is only 3.1.  He gets a lot of replacement potassium.  He really needs to have his potassium better given that he has the atrial fibrillation.  His blood sugars are on the higher side.  I am really not surprised by this.  His INR is 2.4.  This seems to be improving and now is therapeutic.  I would have to think that he can go home at the latest tomorrow.  He really has made nice progress this week.  He is not hurting.  He has had no obvious bleeding.  He continues on the oral vancomycin for the C. difficile.  There has been no fever.  His vital signs are all stable.  Temperature 97.8.  Pulse 68.  Blood pressure 122/83.  Oxygen saturation on room air is 99%.  His lungs sound clear bilaterally.  He has good breath sounds bilaterally.  He has good air movement bilaterally.  Cardiac exam irregular rate and irregular rhythm consistent with atrial fibrillation.  The rate is well controlled.  Abdomen is soft.  There is no distention.  Bowel sounds are present.  There is no palpable fluid wave.  There is no palpable liver or spleen tip.  Extremities show some chronic edema in his legs and arms.  Neurological exam is nonfocal.  Mr. Haymer is recovering from the C. difficile colitis.  I am happy that he is eating better.  He is getting more nutrition.  Again we will have to replace the iron and the potassium.  Hopefully, he will be able to go home today or this weekend.  I appreciate the outstanding care that he is getting from all staff on 4 E.  Lattie Haw, MD  [facility]

## 2019-09-20 NOTE — Telephone Encounter (Signed)
Called and spoke to wife about appointments that have been rescheduled per 9/17 sch msg from Dr Marin Olp.  She stated she will keep [place] updated on his condition in case we needed to cancel the future appts as well.

## 2019-09-20 NOTE — TOC Transition Note (Signed)
Transition of Care [facility]) - CM/SW Discharge Note   Patient Details  Name: Zachary Willis MRN: [id number] Date of Birth: 10-24-1945  [facility] CM/SW Contact:  Ross Ludwig, LCSW Phone Number: 09/20/2019, 10:59 AM   Clinical Narrative:     Patient will be going home with home health through [facility].  CSW signing off please reconsult with any other social work needs, home health agency has been notified of planned discharge.    Final next level of care: [place] Barriers to Discharge: Barriers Resolved   Patient Goals and CMS Choice Patient states their goals for this hospitalization and ongoing recovery are:: To go home with home health services. CMS Medicare.gov Compare Post Acute Care list provided to:: Patient Choice offered to / list presented to : Patient  Discharge Placement  Patient discharging back home with home health services.                     Discharge Plan and Services   Discharge Planning Services: CM Consult              DME Agency: NA       HH Arranged: RN, PT [place] Agency: [facility] (formerly [employer]) Date Newborn: 09/19/19 Time [place]: 1058 Representative spoke with at [place]: [place] Determinants of Health ([place]) Interventions     Readmission Risk Interventions No flowsheet data found.

## 2019-09-20 NOTE — Progress Notes (Signed)
ANTICOAGULATION CONSULT NOTE  Pharmacy Consult for warfarin Indication: atrial fibrillation  No Known Allergies  Patient Measurements: Height: 5\' 10"  (177.8 cm) Weight: 116.4 kg (256 lb 11.2 oz) IBW/kg (Calculated) : 73  Vital Signs: Temp: 97.8 F (36.6 C) (09/17 0517) Temp Source: Oral (09/17 0517) BP: 122/83 (09/17 0517) Pulse Rate: 68 (09/17 0517)  Labs: Recent Labs    09/18/19 0529 09/18/19 0529 09/19/19 0315 09/20/19 0440  HGB 9.1*   < > 8.9* 8.7*  HCT 28.5*  --  28.6* 28.0*  PLT 87*  --  91* 100*  LABPROT 19.8*  --  22.4* 25.1*  INR 1.8*  --  2.1* 2.4*  CREATININE 1.11  --  0.88 0.85   < > = values in this interval not displayed.    Estimated Creatinine Clearance: 101.9 mL/min (by C-G formula based on SCr of 0.85 mg/dL).   Medications: Warfarin 2.5 mg and 5 mg alternating every other day PTA INR on admission: 8.7  Assessment: Pt is a 36 yoM on warfarin PTA for history of atrial fibrillation. PMH significant for metastatic colon cancer.  Pt admitted with mutifactorial shock (septic, hypovolemic), currently on PO vancomycin for c.diff.    Significant Events: -9/8: INR 8.7, metronidazole dose given -9/9: INR >10, no bleeding. Vitamin K 2.5 + 10 mg IV; FFP x 2 -9/10: INR 1.8 s/p vitamin K -9/11: Resume warfarin dosing -9/13: PRBC x 1, Lovenox bridge per Onc (no bleeding noted; Onc expects slow Hgb recovery given DM and Chemo) -9/16: INR therapeutic, LMWH discontinued  Today, 09/20/19  INR remains therapeutic today; continues to increase  Hgb low; drifting slightly down after txfusion but no reported bleeding; IV iron given 9/16  Plt low d/t chemotherapy on 8/31; continues to improve  Eating 80% of meals, improved  No significant DDI - ASA held (not on PTA)  Goal of Therapy:  INR 2-3 Monitor platelets by anticoagulation protocol: Yes   Plan:   Warfarin 5 mg PO today  For discharge, would resume PTA dosing (above) with INR recheck in 2-3  days  INR daily  Follow for signs/symptoms of bleeding  Shawntell Dixson A, PharmD 09/20/19 9:37 AM

## 2019-09-23 ENCOUNTER — Telehealth: Payer: Self-pay | Admitting: *Deleted

## 2019-09-23 NOTE — Telephone Encounter (Signed)
Message received from patient's wife stating that upon discharge from the hospital last week, pt was told to check with Dr. Marin Olp regarding continuing Acyclovir.  Dr. Marin Olp notified and call placed back to patient's wife to inform her per order of Dr. Marin Olp for pt to discontinue Acyclovir at this time.  Pt.'s wife is appreciative of call back and has no further questions or concerns at this time.

## 2019-09-24 ENCOUNTER — Inpatient Hospital Stay: Payer: Medicare HMO

## 2019-09-24 ENCOUNTER — Inpatient Hospital Stay: Payer: Medicare HMO | Admitting: Hematology & Oncology

## 2019-09-26 ENCOUNTER — Inpatient Hospital Stay: Payer: Medicare HMO

## 2019-10-01 ENCOUNTER — Inpatient Hospital Stay: Payer: Medicare HMO

## 2019-10-01 ENCOUNTER — Inpatient Hospital Stay: Payer: Medicare HMO | Admitting: Hematology & Oncology

## 2019-10-03 ENCOUNTER — Inpatient Hospital Stay: Payer: Medicare HMO

## 2019-10-07 ENCOUNTER — Telehealth: Payer: Self-pay | Admitting: Hematology & Oncology

## 2019-10-07 ENCOUNTER — Inpatient Hospital Stay: Payer: Medicare HMO

## 2019-10-07 ENCOUNTER — Encounter: Payer: Self-pay | Admitting: Hematology & Oncology

## 2019-10-07 ENCOUNTER — Other Ambulatory Visit: Payer: Self-pay

## 2019-10-07 ENCOUNTER — Telehealth: Payer: Self-pay

## 2019-10-07 ENCOUNTER — Inpatient Hospital Stay: Payer: Medicare HMO | Attending: Hematology & Oncology

## 2019-10-07 ENCOUNTER — Inpatient Hospital Stay (HOSPITAL_BASED_OUTPATIENT_CLINIC_OR_DEPARTMENT_OTHER): Payer: Medicare HMO | Admitting: Hematology & Oncology

## 2019-10-07 VITALS — BP 129/81 | HR 62 | Temp 99.0°F | Resp 17 | Wt 249.1 lb

## 2019-10-07 DIAGNOSIS — C787 Secondary malignant neoplasm of liver and intrahepatic bile duct: Secondary | ICD-10-CM | POA: Diagnosis not present

## 2019-10-07 DIAGNOSIS — I4891 Unspecified atrial fibrillation: Secondary | ICD-10-CM | POA: Diagnosis not present

## 2019-10-07 DIAGNOSIS — C189 Malignant neoplasm of colon, unspecified: Secondary | ICD-10-CM | POA: Diagnosis present

## 2019-10-07 DIAGNOSIS — Z7901 Long term (current) use of anticoagulants: Secondary | ICD-10-CM | POA: Diagnosis not present

## 2019-10-07 LAB — CMP (CANCER CENTER ONLY)
ALT: 60 U/L — ABNORMAL HIGH (ref 0–44)
AST: 44 U/L — ABNORMAL HIGH (ref 15–41)
Albumin: 3 g/dL — ABNORMAL LOW (ref 3.5–5.0)
Alkaline Phosphatase: 498 U/L — ABNORMAL HIGH (ref 38–126)
Anion gap: 4 — ABNORMAL LOW (ref 5–15)
BUN: 15 mg/dL (ref 8–23)
CO2: 38 mmol/L — ABNORMAL HIGH (ref 22–32)
Calcium: 8.9 mg/dL (ref 8.9–10.3)
Chloride: 101 mmol/L (ref 98–111)
Creatinine: 0.89 mg/dL (ref 0.61–1.24)
GFR, Est AFR Am: >60 mL/min (ref >60)
GFR, Estimated: >60 mL/min (ref >60)
Glucose, Bld: 210 mg/dL — ABNORMAL HIGH (ref 70–99)
Potassium: 2.9 mmol/L — CL (ref 3.5–5.1)
Sodium: 143 mmol/L (ref 135–145)
Total Bilirubin: 1.4 mg/dL — ABNORMAL HIGH (ref 0.3–1.2)
Total Protein: 5.3 g/dL — ABNORMAL LOW (ref 6.5–8.1)

## 2019-10-07 LAB — CBC WITH DIFFERENTIAL (CANCER CENTER ONLY)
Abs Immature Granulocytes: 0.14 10*3/uL — ABNORMAL HIGH (ref 0.00–0.07)
Basophils Absolute: 0.1 10*3/uL (ref 0.0–0.1)
Basophils Relative: 1 %
Eosinophils Absolute: 0.2 10*3/uL (ref 0.0–0.5)
Eosinophils Relative: 3 %
HCT: 34.5 % — ABNORMAL LOW (ref 39.0–52.0)
Hemoglobin: 10.5 g/dL — ABNORMAL LOW (ref 13.0–17.0)
Immature Granulocytes: 2 %
Lymphocytes Relative: 8 %
Lymphs Abs: 0.7 10*3/uL (ref 0.7–4.0)
MCH: 31.3 pg (ref 26.0–34.0)
MCHC: 30.4 g/dL (ref 30.0–36.0)
MCV: 102.7 fL — ABNORMAL HIGH (ref 80.0–100.0)
Monocytes Absolute: 0.5 10*3/uL (ref 0.1–1.0)
Monocytes Relative: 6 %
Neutro Abs: 6.6 10*3/uL (ref 1.7–7.7)
Neutrophils Relative %: 80 %
Platelet Count: 66 10*3/uL — ABNORMAL LOW (ref 150–400)
RBC: 3.36 MIL/uL — ABNORMAL LOW (ref 4.22–5.81)
RDW: 19.5 % — ABNORMAL HIGH (ref 11.5–15.5)
WBC Count: 8.2 10*3/uL (ref 4.0–10.5)
nRBC: 0.4 % — ABNORMAL HIGH (ref 0.0–0.2)

## 2019-10-07 LAB — LACTATE DEHYDROGENASE: LDH: 443 U/L — ABNORMAL HIGH (ref 98–192)

## 2019-10-07 NOTE — Telephone Encounter (Signed)
Appointments scheduled patient will get updates from My Chart per 10/4 los

## 2019-10-07 NOTE — Patient Instructions (Signed)

## 2019-10-07 NOTE — Telephone Encounter (Signed)
Dr Marin Olp aware of K+ 2.9. Will address in today's office visit. dph

## 2019-10-07 NOTE — Progress Notes (Signed)
Hematology and Oncology Follow Up Visit  Zachary Willis [id number] Dec 30, 1945 74 y.o. 10/07/2019   Principle Diagnosis:  Metastatic colon cancer - progressive  Past Therapy: Status post RFA to the liver-11/21/2017  S/P Y-90 x 2 -- last therapy on 08/27/2018  Current Therapy: FOLFIRI -- started on 01/09/2019, s/p cycle #11   Interim History:  Zachary Willis is here today for follow-up.  Unfortunately, he is not doing all that well.  He was hospitalized for almost a couple weeks back in September.  He had C. difficile colitis.  He had a ton of diarrhea.  He had severe electrolyte abnormalities.  He was very weak.  He cannot eat for a while.  He finally began to improve.  He still is weak.  He was discharged from the hospital on 17 September.  I just do not think he is ready for any treatment right now.  His labs look unusual.  His potassium is only 2.9.  He says he is taking potassium twice a day.  I told him to double that dose.  His liver function studies are elevated.  His ocular phosphatase is almost 500.  His LDH is 443.  His last CEA back in August was 80.  He has had no bleeding.  He is on Coumadin.  When he was hospitalized his INR was over 8.  He had to have this reversed.  Again, he is still weak.  He is getting about a little bit better.  He says he is eating a little bit better.  Currently, I would say his performance status is probably ECOG 1-2.  Medications:  Allergies as of 10/07/2019   No Known Allergies     Medication List       Accurate as of October 07, 2019 11:31 AM. If you have any questions, ask your nurse or doctor.        allopurinol 300 MG tablet Commonly known as: ZYLOPRIM Take 300 mg by mouth every morning.   atorvastatin 40 MG tablet Commonly known as: LIPITOR Take 40 mg by mouth daily.   carvedilol 6.25 MG tablet Commonly known as: COREG Take 6.25 mg by mouth 2 (two) times daily with a meal.   [facility] Aspirin Adult Low Dose 81 MG  chewable tablet Generic drug: aspirin Chew 81 mg by mouth daily.   dexamethasone 4 MG tablet Commonly known as: DECADRON Take 2 tablets (8 mg total) by mouth daily. Start the day after chemo for 2 days.   ferrous sulfate 325 (65 FE) MG tablet Take 325 mg by mouth daily.   furosemide 40 MG tablet Commonly known as: LASIX Take 40 mg by mouth daily.   hydrocortisone 10 MG tablet Commonly known as: CORTEF Take 1-2 tablets (10-20 mg total) by mouth See admin instructions. 20mg  in AM 10mg  in PM   ibuprofen 200 MG tablet Commonly known as: ADVIL Take 400 mg by mouth every 6 (six) hours as needed for headache or mild pain.   Klor-Con 10 10 MEQ tablet Generic drug: potassium chloride Take 20 mEq by mouth daily.   levothyroxine 88 MCG tablet Commonly known as: SYNTHROID Take 88 mcg by mouth daily before breakfast.   lidocaine-prilocaine cream Commonly known as: EMLA Apply to affected area once What changed:   how much to take  how to take this  when to take this  reasons to take this   loperamide 2 MG tablet Commonly known as: Imodium A-D Take 1 tablet (2 mg total) by mouth  4 (four) times daily as needed. Take 2 at diarrhea onset , then 1 every 2hr until 12hrs with no BM. May take 2 every 4hrs at night. If diarrhea recurs repeat.   NASAL [place] NA Place 1 spray into the nose daily.   ondansetron 8 MG tablet Commonly known as: ZOFRAN Take 1 tablet (8 mg total) by mouth 2 (two) times daily as needed for refractory nausea / vomiting. Start on day 3 after chemotherapy.   pantoprazole 40 MG tablet Commonly known as: PROTONIX Take 40 mg by mouth every morning.   polyvinyl alcohol 1.4 % ophthalmic solution Commonly known as: LIQUIFILM TEARS Place 1 drop into both eyes daily as needed for dry eyes.   prochlorperazine 10 MG tablet Commonly known as: COMPAZINE Take 1 tablet (10 mg total) by mouth every 6 (six) hours as needed (NAUSEA).   testosterone 4 MG/24HR Pt24  patch Commonly known as: ANDRODERM Place 1 patch onto the skin daily.   warfarin 5 MG tablet Commonly known as: COUMADIN Take 2.5-5 mg by mouth See admin instructions. 5mg  alternating with 2.5mg  every other day       Allergies: No Known Allergies  Past Medical History, Surgical history, Social history, and Family History were reviewed and updated.  Review of Systems: Review of Systems  Constitutional: Negative.   HENT: Negative.   Eyes: Negative.   Respiratory: Negative.   Cardiovascular: Positive for palpitations.  Gastrointestinal: Negative.   Genitourinary: Negative.   Musculoskeletal: Negative.   Skin: Negative.   Neurological: Negative.   Endo/Heme/Allergies: Negative.   Psychiatric/Behavioral: Negative.       Physical Exam:  weight is 249 lb 1.9 oz (113 kg). His oral temperature is 99 F (37.2 C). His blood pressure is 129/81 and his pulse is 62. His respiration is 17 and oxygen saturation is 100%.   Wt Readings from Last 3 Encounters:  10/07/19 249 lb 1.9 oz (113 kg)  09/19/19 256 lb 11.2 oz (116.4 kg)  09/03/19 255 lb 1.6 oz (115.7 kg)    Physical Exam Vitals reviewed.  HENT:     Head: Normocephalic and atraumatic.  Eyes:     Pupils: Pupils are equal, round, and reactive to light.  Cardiovascular:     Rate and Rhythm: Normal rate and regular rhythm.     Heart sounds: Normal heart sounds.     Comments: Cardiac exam shows an irregular rate and irregular rhythm consistent with atrial fibrillation.  The rate is well controlled.  He has 1/6 systolic ejection murmur. Pulmonary:     Effort: Pulmonary effort is normal.     Breath sounds: Normal breath sounds.  Abdominal:     General: Bowel sounds are normal.     Palpations: Abdomen is soft.     Comments: Abdominal exam shows multiple laparotomy scars.  He has no fluid wave.  He has no guarding or rebound tenderness.  There is no palpable abdominal mass.  There is no palpable liver or spleen tip.   Musculoskeletal:        General: No tenderness or deformity. Normal range of motion.     Cervical back: Normal range of motion.  Lymphadenopathy:     Cervical: No cervical adenopathy.  Skin:    General: Skin is warm and dry.     Findings: No erythema or rash.  Neurological:     Mental Status: He is alert and oriented to person, place, and time.  Psychiatric:        Behavior: Behavior normal.  Thought Content: Thought content normal.        Judgment: Judgment normal.    Lab Results  Component Value Date   WBC 8.2 10/07/2019   HGB 10.5 (L) 10/07/2019   HCT 34.5 (L) 10/07/2019   MCV 102.7 (H) 10/07/2019   PLT 66 (L) 10/07/2019   Lab Results  Component Value Date   FERRITIN 173 09/19/2019   IRON 39 (L) 09/19/2019   TIBC 240 (L) 09/19/2019   UIBC 201 09/19/2019   IRONPCTSAT 16 (L) 09/19/2019   Lab Results  Component Value Date   RBC 3.36 (L) 10/07/2019   No results found for: KPAFRELGTCHN, LAMBDASER, KAPLAMBRATIO No results found for: IGGSERUM, IGA, IGMSERUM No results found for: Kathrynn Ducking, MSPIKE, SPEI   Chemistry      Component Value Date/Time   NA 143 10/07/2019 1020   K 2.9 (LL) 10/07/2019 1020   CL 101 10/07/2019 1020   CO2 38 (H) 10/07/2019 1020   BUN 15 10/07/2019 1020   CREATININE 0.89 10/07/2019 1020      Component Value Date/Time   CALCIUM 8.9 10/07/2019 1020   ALKPHOS 498 (H) 10/07/2019 1020   AST 44 (H) 10/07/2019 1020   ALT 60 (H) 10/07/2019 1020   BILITOT 1.4 (H) 10/07/2019 1020      Impression and Plan: Mr. Dauphin is a very pleasant 74 yo caucasian gentleman with metastatic colon cancer.   I realize that his platelet count is down a little bit.  I am surprised that his platelet count is still down.  I will know if this is reflective of his liver or if this is reflective of accumulative chemotherapy administration.  He is not had chemotherapy now probably for about 3 weeks or so.  We will plan  to get him back to see [place] in another couple weeks.  However, he is responding to treatment.  I would hate to have delays.  We will see how he is doing.  If we need to make any adjustments to his protocol, we will have to see about trying for a biopsy and see if there are any molecular abnormalities with his cancer that we might be able to take advantage of.  I know that this would be highly unlikely but yet it is always worthwhile trying.    [name], MD 10/4/202111:31 AM

## 2019-10-08 LAB — CEA (IN HOUSE-CHCC): CEA (CHCC-In House): 134.77 ng/mL — ABNORMAL HIGH (ref 0.00–5.00)

## 2019-10-09 ENCOUNTER — Inpatient Hospital Stay: Payer: Medicare HMO

## 2019-10-22 ENCOUNTER — Inpatient Hospital Stay (HOSPITAL_BASED_OUTPATIENT_CLINIC_OR_DEPARTMENT_OTHER): Payer: Medicare HMO | Admitting: Family

## 2019-10-22 ENCOUNTER — Inpatient Hospital Stay: Payer: Medicare HMO

## 2019-10-22 ENCOUNTER — Encounter: Payer: Self-pay | Admitting: Family

## 2019-10-22 ENCOUNTER — Other Ambulatory Visit: Payer: Self-pay

## 2019-10-22 ENCOUNTER — Telehealth: Payer: Self-pay | Admitting: Family

## 2019-10-22 VITALS — BP 143/90 | HR 60 | Temp 98.6°F | Resp 18 | Ht 70.0 in | Wt 245.0 lb

## 2019-10-22 DIAGNOSIS — C189 Malignant neoplasm of colon, unspecified: Secondary | ICD-10-CM

## 2019-10-22 DIAGNOSIS — Z95828 Presence of other vascular implants and grafts: Secondary | ICD-10-CM

## 2019-10-22 DIAGNOSIS — C787 Secondary malignant neoplasm of liver and intrahepatic bile duct: Secondary | ICD-10-CM

## 2019-10-22 LAB — CMP (CANCER CENTER ONLY)
ALT: 45 U/L — ABNORMAL HIGH (ref 0–44)
AST: 39 U/L (ref 15–41)
Albumin: 3.3 g/dL — ABNORMAL LOW (ref 3.5–5.0)
Alkaline Phosphatase: 330 U/L — ABNORMAL HIGH (ref 38–126)
Anion gap: 7 (ref 5–15)
BUN: 14 mg/dL (ref 8–23)
CO2: 32 mmol/L (ref 22–32)
Calcium: 9.1 mg/dL (ref 8.9–10.3)
Chloride: 103 mmol/L (ref 98–111)
Creatinine: 1.04 mg/dL (ref 0.61–1.24)
GFR, Estimated: >60 mL/min (ref >60)
Glucose, Bld: 212 mg/dL — ABNORMAL HIGH (ref 70–99)
Potassium: 3.5 mmol/L (ref 3.5–5.1)
Sodium: 142 mmol/L (ref 135–145)
Total Bilirubin: 1.2 mg/dL (ref 0.3–1.2)
Total Protein: 6 g/dL — ABNORMAL LOW (ref 6.5–8.1)

## 2019-10-22 LAB — CBC WITH DIFFERENTIAL (CANCER CENTER ONLY)
Abs Immature Granulocytes: 0.08 10*3/uL — ABNORMAL HIGH (ref 0.00–0.07)
Basophils Absolute: 0 10*3/uL (ref 0.0–0.1)
Basophils Relative: 1 %
Eosinophils Absolute: 0.1 10*3/uL (ref 0.0–0.5)
Eosinophils Relative: 2 %
HCT: 37.1 % — ABNORMAL LOW (ref 39.0–52.0)
Hemoglobin: 11.3 g/dL — ABNORMAL LOW (ref 13.0–17.0)
Immature Granulocytes: 1 %
Lymphocytes Relative: 14 %
Lymphs Abs: 0.8 10*3/uL (ref 0.7–4.0)
MCH: 29.9 pg (ref 26.0–34.0)
MCHC: 30.5 g/dL (ref 30.0–36.0)
MCV: 98.1 fL (ref 80.0–100.0)
Monocytes Absolute: 0.4 10*3/uL (ref 0.1–1.0)
Monocytes Relative: 7 %
Neutro Abs: 4.4 10*3/uL (ref 1.7–7.7)
Neutrophils Relative %: 75 %
Platelet Count: 81 10*3/uL — ABNORMAL LOW (ref 150–400)
RBC: 3.78 MIL/uL — ABNORMAL LOW (ref 4.22–5.81)
RDW: 16 % — ABNORMAL HIGH (ref 11.5–15.5)
WBC Count: 5.8 10*3/uL (ref 4.0–10.5)
nRBC: 0.5 % — ABNORMAL HIGH (ref 0.0–0.2)

## 2019-10-22 LAB — CEA (IN HOUSE-CHCC): CEA (CHCC-In House): 357.81 ng/mL — ABNORMAL HIGH (ref 0.00–5.00)

## 2019-10-22 MED ORDER — SODIUM CHLORIDE 0.9% FLUSH
10.0000 mL | Freq: Once | INTRAVENOUS | Status: AC
  Administered 2019-10-22: 10 mL via INTRAVENOUS
  Filled 2019-10-22: qty 10

## 2019-10-22 MED ORDER — HEPARIN SOD (PORK) LOCK FLUSH 100 UNIT/ML IV SOLN
500.0000 [IU] | Freq: Once | INTRAVENOUS | Status: AC
  Administered 2019-10-22: 500 [IU] via INTRAVENOUS
  Filled 2019-10-22: qty 5

## 2019-10-22 NOTE — Patient Instructions (Signed)
Implanted Port Insertion, Care After °This sheet gives you information about how to care for yourself after your procedure. Your health care provider may also give you more specific instructions. If you have problems or questions, contact your health care provider. °What can I expect after the procedure? °After the procedure, it is common to have: °· Discomfort at the port insertion site. °· Bruising on the skin over the port. This should improve over 3-4 days. °Follow these instructions at home: °Port care °· After your port is placed, you will get a manufacturer's information card. The card has information about your port. Keep this card with you at all times. °· Take care of the port as told by your health care provider. Ask your health care provider if you or a family member can get training for taking care of the port at home. A home health care nurse may also take care of the port. °· Make sure to remember what type of port you have. °Incision care ° °  ° °· Follow instructions from your health care provider about how to take care of your port insertion site. Make sure you: °? Wash your hands with soap and water before and after you change your bandage (dressing). If soap and water are not available, use hand sanitizer. °? Change your dressing as told by your health care provider. °? Leave stitches (sutures), skin glue, or adhesive strips in place. These skin closures may need to stay in place for 2 weeks or longer. If adhesive strip edges start to loosen and curl up, you may trim the loose edges. Do not remove adhesive strips completely unless your health care provider tells you to do that. °· Check your port insertion site every day for signs of infection. Check for: °? Redness, swelling, or pain. °? Fluid or blood. °? Warmth. °? Pus or a bad smell. °Activity °· Return to your normal activities as told by your health care provider. Ask your health care provider what activities are safe for you. °· Do not  lift anything that is heavier than 10 lb (4.5 kg), or the limit that you are told, until your health care provider says that it is safe. °General instructions °· Take over-the-counter and prescription medicines only as told by your health care provider. °· Do not take baths, swim, or use a hot tub until your health care provider approves. Ask your health care provider if you may take showers. You may only be allowed to take sponge baths. °· Do not drive for 24 hours if you were given a sedative during your procedure. °· Wear a medical alert bracelet in case of an emergency. This will tell any health care providers that you have a port. °· Keep all follow-up visits as told by your health care provider. This is important. °Contact a health care provider if: °· You cannot flush your port with saline as directed, or you cannot draw blood from the port. °· You have a fever or chills. °· You have redness, swelling, or pain around your port insertion site. °· You have fluid or blood coming from your port insertion site. °· Your port insertion site feels warm to the touch. °· You have pus or a bad smell coming from the port insertion site. °Get help right away if: °· You have chest pain or shortness of breath. °· You have bleeding from your port that you cannot control. °Summary °· Take care of the port as told by your health   care provider. Keep the manufacturer's information card with you at all times. °· Change your dressing as told by your health care provider. °· Contact a health care provider if you have a fever or chills or if you have redness, swelling, or pain around your port insertion site. °· Keep all follow-up visits as told by your health care provider. °This information is not intended to replace advice given to you by your health care provider. Make sure you discuss any questions you have with your health care provider. °Document Revised: 07/18/2017 Document Reviewed: 07/18/2017 °Elsevier Patient Education ©  2020 [employer] ° °

## 2019-10-22 NOTE — Telephone Encounter (Signed)
Appointments moved out by 1 week per [facility] no treatment today/ Calendar printed per 10/19 los

## 2019-10-22 NOTE — Addendum Note (Signed)
Addended by: Tyler Aas A on: 10/22/2019 10:27 AM   Modules accepted: Orders

## 2019-10-22 NOTE — Progress Notes (Signed)
Hematology and Oncology Follow Up Visit  Zachary Willis [id number] June 23, 1945 74 y.o. 10/22/2019   Principle Diagnosis:  Metastatic colon cancer - progressive  Past Therapy: Status post RFA to the liver-11/21/2017  S/P Y-90 x 2 -- last therapy on 08/27/2018  Current Therapy: FOLFIRI -- started on 01/09/2019, s/p cycle12   Interim History:  Zachary Willis is here today for follow-up. He is doing well and states that he is finally noticing an improvement in his energy and stamina.  He has not had any more diarrhea.  His counts continue to slowly improve. Platelets are 81, Hgb 11.3, WBC count 5.8, potassium 3.5, ALT 45 and Alk Phos 330.  CEA 2 weeks ago was 134.  No fever, chills, n/v, cough, rash, dizziness, SOB, chest pain, palpitations, abdominal pain or changes in bowel or bladder habits.  No episodes of blood loss. No bruising or petechiae.  No tenderness, numbness or tingling in his extremities.  He has puffiness in his ankles that waxes and wanes. He is taking lasix daily which helps reduce his fluid retention.   No falls or syncopal episodes to report.  He is eating well now and doing his best to stay well hydrated. His weight is down 4 lbs since his last visit.   ECOG Performance Status: 2 - Symptomatic, <50% confined to bed  Medications:  Allergies as of 10/22/2019   No Known Allergies     Medication List       Accurate as of October 22, 2019  9:42 AM. If you have any questions, ask your nurse or doctor.        allopurinol 300 MG tablet Commonly known as: ZYLOPRIM Take 300 mg by mouth every morning.   atorvastatin 40 MG tablet Commonly known as: LIPITOR Take 40 mg by mouth daily.   carvedilol 6.25 MG tablet Commonly known as: COREG Take 6.25 mg by mouth 2 (two) times daily with a meal.   [facility] Aspirin Adult Low Dose 81 MG chewable tablet Generic drug: aspirin Chew 81 mg by mouth daily.   dexamethasone 4 MG tablet Commonly known as: DECADRON Take 2  tablets (8 mg total) by mouth daily. Start the day after chemo for 2 days.   ferrous sulfate 325 (65 FE) MG tablet Take 325 mg by mouth daily.   furosemide 40 MG tablet Commonly known as: LASIX Take 40 mg by mouth daily.   hydrocortisone 10 MG tablet Commonly known as: CORTEF Take 1-2 tablets (10-20 mg total) by mouth See admin instructions. 33m in AM 174min PM   ibuprofen 200 MG tablet Commonly known as: ADVIL Take 400 mg by mouth every 6 (six) hours as needed for headache or mild pain.   Klor-Con 10 10 MEQ tablet Generic drug: potassium chloride Take 20 mEq by mouth daily.   levothyroxine 88 MCG tablet Commonly known as: SYNTHROID Take 88 mcg by mouth daily before breakfast.   lidocaine-prilocaine cream Commonly known as: EMLA Apply to affected area once What changed:   how much to take  how to take this  when to take this  reasons to take this   loperamide 2 MG tablet Commonly known as: Imodium A-D Take 1 tablet (2 mg total) by mouth 4 (four) times daily as needed. Take 2 at diarrhea onset , then 1 every 2hr until 12hrs with no BM. May take 2 every 4hrs at night. If diarrhea recurs repeat.   NASAL [place] Place 1 spray into the nose daily.  ondansetron 8 MG tablet Commonly known as: ZOFRAN Take 1 tablet (8 mg total) by mouth 2 (two) times daily as needed for refractory nausea / vomiting. Start on day 3 after chemotherapy.   pantoprazole 40 MG tablet Commonly known as: PROTONIX Take 40 mg by mouth every morning.   polyvinyl alcohol 1.4 % ophthalmic solution Commonly known as: LIQUIFILM TEARS Place 1 drop into both eyes daily as needed for dry eyes.   prochlorperazine 10 MG tablet Commonly known as: COMPAZINE Take 1 tablet (10 mg total) by mouth every 6 (six) hours as needed (NAUSEA).   testosterone 4 MG/24HR Pt24 patch Commonly known as: ANDRODERM Place 1 patch onto the skin daily.   warfarin 5 MG tablet Commonly known as: COUMADIN Take 2.5-5 mg  by mouth See admin instructions. 33m alternating with 2.554mevery other day       Allergies: No Known Allergies  Past Medical History, Surgical history, Social history, and Family History were reviewed and updated.  Review of Systems: All other 10 point review of systems is negative.   Physical Exam:  vitals were not taken for this visit.   Wt Readings from Last 3 Encounters:  10/07/19 249 lb 1.9 oz (113 kg)  09/19/19 256 lb 11.2 oz (116.4 kg)  09/03/19 255 lb 1.6 oz (115.7 kg)    Ocular: Sclerae unicteric, pupils equal, round and reactive to light Ear-nose-throat: Oropharynx clear, dentition fair Lymphatic: No cervical or supraclavicular adenopathy Lungs no rales or rhonchi, good excursion bilaterally Heart regular rate and rhythm, no murmur appreciated Abd soft, nontender, positive bowel sounds, no liver or spleen tip palpated on exam, no fluid wave  MSK no focal spinal tenderness, no joint edema Neuro: non-focal, well-oriented, appropriate affect Breasts: Deferred   Lab Results  Component Value Date   WBC 5.8 10/22/2019   HGB 11.3 (L) 10/22/2019   HCT 37.1 (L) 10/22/2019   MCV 98.1 10/22/2019   PLT 81 (L) 10/22/2019   Lab Results  Component Value Date   FERRITIN 173 09/19/2019   IRON 39 (L) 09/19/2019   TIBC 240 (L) 09/19/2019   UIBC 201 09/19/2019   IRONPCTSAT 16 (L) 09/19/2019   Lab Results  Component Value Date   RBC 3.78 (L) 10/22/2019   No results found for: KPAFRELGTCHN, LAMBDASER, KAPLAMBRATIO No results found for: IGGSERUM, IGA, IGMSERUM No results found for: TOKathrynn DuckingMSPIKE, SPEI   Chemistry      Component Value Date/Time   NA 143 10/07/2019 1020   K 2.9 (LL) 10/07/2019 1020   CL 101 10/07/2019 1020   CO2 38 (H) 10/07/2019 1020   BUN 15 10/07/2019 1020   CREATININE 0.89 10/07/2019 1020      Component Value Date/Time   CALCIUM 8.9 10/07/2019 1020   ALKPHOS 498 (H) 10/07/2019 1020   AST 44  (H) 10/07/2019 1020   ALT 60 (H) 10/07/2019 1020   BILITOT 1.4 (H) 10/07/2019 1020       Impression and Plan: Mr. [name] a very pleasant [age]o caucasian gentleman with metastatic colon cancer.  He seems to be slowly improving. We will give him once more week off.  We will see him again next week for repeat lab work and if things look better we will resume treatment.  He is in agreement with the plan.  He was encouraged to contact our office with any questions or concerns.   SaLaverna PeaceNP 10/19/20219:42 AM

## 2019-10-22 NOTE — Progress Notes (Signed)
No treatment today per Sarah [place],NP. Patients port deaccessed and patient discharged home in no apparent distress. Pt left ambulatory with cane.  Pt aware of discharge instructions and verbalized understanding and had no further questions.

## 2019-10-24 ENCOUNTER — Other Ambulatory Visit: Payer: Self-pay | Admitting: Family

## 2019-10-24 ENCOUNTER — Telehealth: Payer: Self-pay | Admitting: Family

## 2019-10-24 DIAGNOSIS — C189 Malignant neoplasm of colon, unspecified: Secondary | ICD-10-CM

## 2019-10-24 NOTE — Telephone Encounter (Signed)
Message from DR. Ennever starting he would like patient to have repeat scans in the next week or so due to increased CEA.  Left message for patient with call back number to go over lab work as well as new scan orders. Scheduling message sent.

## 2019-10-29 ENCOUNTER — Ambulatory Visit (HOSPITAL_BASED_OUTPATIENT_CLINIC_OR_DEPARTMENT_OTHER)
Admission: RE | Admit: 2019-10-29 | Discharge: 2019-10-29 | Disposition: A | Discharge status: Home / Self Care | Payer: Medicare HMO | Source: Ambulatory Visit | Attending: Family | Admitting: Family

## 2019-10-29 ENCOUNTER — Other Ambulatory Visit: Payer: Self-pay

## 2019-10-29 ENCOUNTER — Inpatient Hospital Stay: Payer: Medicare HMO

## 2019-10-29 ENCOUNTER — Inpatient Hospital Stay (HOSPITAL_BASED_OUTPATIENT_CLINIC_OR_DEPARTMENT_OTHER): Payer: Medicare HMO | Admitting: Hematology & Oncology

## 2019-10-29 ENCOUNTER — Encounter: Payer: Self-pay | Admitting: Hematology & Oncology

## 2019-10-29 VITALS — BP 145/94 | HR 70 | Temp 98.5°F | Resp 18 | Wt 242.5 lb

## 2019-10-29 VITALS — BP 145/94 | HR 70 | Temp 98.5°F | Resp 18

## 2019-10-29 DIAGNOSIS — C189 Malignant neoplasm of colon, unspecified: Secondary | ICD-10-CM | POA: Insufficient documentation

## 2019-10-29 DIAGNOSIS — C787 Secondary malignant neoplasm of liver and intrahepatic bile duct: Secondary | ICD-10-CM

## 2019-10-29 DIAGNOSIS — I480 Paroxysmal atrial fibrillation: Secondary | ICD-10-CM | POA: Diagnosis not present

## 2019-10-29 DIAGNOSIS — Z95828 Presence of other vascular implants and grafts: Secondary | ICD-10-CM

## 2019-10-29 LAB — CMP (CANCER CENTER ONLY)
ALT: 41 U/L (ref 0–44)
AST: 38 U/L (ref 15–41)
Albumin: 3.3 g/dL — ABNORMAL LOW (ref 3.5–5.0)
Alkaline Phosphatase: 305 U/L — ABNORMAL HIGH (ref 38–126)
Anion gap: 7 (ref 5–15)
BUN: 16 mg/dL (ref 8–23)
CO2: 31 mmol/L (ref 22–32)
Calcium: 9.2 mg/dL (ref 8.9–10.3)
Chloride: 103 mmol/L (ref 98–111)
Creatinine: 0.86 mg/dL (ref 0.61–1.24)
GFR, Estimated: >60 mL/min (ref >60)
Glucose, Bld: 175 mg/dL — ABNORMAL HIGH (ref 70–99)
Potassium: 3.7 mmol/L (ref 3.5–5.1)
Sodium: 141 mmol/L (ref 135–145)
Total Bilirubin: 1.2 mg/dL (ref 0.3–1.2)
Total Protein: 6.2 g/dL — ABNORMAL LOW (ref 6.5–8.1)

## 2019-10-29 LAB — CBC WITH DIFFERENTIAL (CANCER CENTER ONLY)
Abs Immature Granulocytes: 0.25 10*3/uL — ABNORMAL HIGH (ref 0.00–0.07)
Basophils Absolute: 0.1 10*3/uL (ref 0.0–0.1)
Basophils Relative: 1 %
Eosinophils Absolute: 0.2 10*3/uL (ref 0.0–0.5)
Eosinophils Relative: 3 %
HCT: 39.7 % (ref 39.0–52.0)
Hemoglobin: 12.3 g/dL — ABNORMAL LOW (ref 13.0–17.0)
Immature Granulocytes: 4 %
Lymphocytes Relative: 20 %
Lymphs Abs: 1.4 10*3/uL (ref 0.7–4.0)
MCH: 30.1 pg (ref 26.0–34.0)
MCHC: 31 g/dL (ref 30.0–36.0)
MCV: 97.1 fL (ref 80.0–100.0)
Monocytes Absolute: 0.5 10*3/uL (ref 0.1–1.0)
Monocytes Relative: 7 %
Neutro Abs: 4.5 10*3/uL (ref 1.7–7.7)
Neutrophils Relative %: 65 %
Platelet Count: 76 10*3/uL — ABNORMAL LOW (ref 150–400)
RBC: 4.09 MIL/uL — ABNORMAL LOW (ref 4.22–5.81)
RDW: 15.5 % (ref 11.5–15.5)
WBC Count: 6.9 10*3/uL (ref 4.0–10.5)
nRBC: 0.3 % — ABNORMAL HIGH (ref 0.0–0.2)

## 2019-10-29 MED ORDER — HEPARIN SOD (PORK) LOCK FLUSH 100 UNIT/ML IV SOLN
500.0000 [IU] | Freq: Once | INTRAVENOUS | Status: AC
  Administered 2019-10-29: 500 [IU] via INTRAVENOUS
  Filled 2019-10-29: qty 5

## 2019-10-29 MED ORDER — IOHEXOL 300 MG/ML  SOLN
100.0000 mL | Freq: Once | INTRAMUSCULAR | Status: AC | PRN
  Administered 2019-10-29: 100 mL via INTRAVENOUS

## 2019-10-29 MED ORDER — SODIUM CHLORIDE 0.9% FLUSH
10.0000 mL | Freq: Once | INTRAVENOUS | Status: AC
  Administered 2019-10-29: 10 mL via INTRAVENOUS
  Filled 2019-10-29: qty 10

## 2019-10-29 MED ORDER — CAPECITABINE 500 MG PO TABS: 2500.0000 mg | ORAL_TABLET | Freq: Two times a day (BID) | ORAL | 5 refills | Status: DC

## 2019-10-29 NOTE — Addendum Note (Signed)
Addended by: Lucile Crater on: 10/29/2019 11:59 AM   Modules accepted: Orders

## 2019-10-29 NOTE — Progress Notes (Signed)
No treatment. Pt will be going on oral chemotherapy. Pt discharged in no apparent distress. Pt left ambulatory without assistance.  Pt aware of discharge instructions and verbalized understanding and had no further questions.

## 2019-10-29 NOTE — Patient Instructions (Signed)

## 2019-10-29 NOTE — Progress Notes (Signed)
Hematology and Oncology Follow Up Visit  Zachary Willis [id number] 04-Oct-1945 74 y.o. 10/29/2019   Principle Diagnosis:  Metastatic colon cancer - progressive  Past Therapy: Status post RFA to the liver-11/21/2017  S/P Y-90 x 2 -- last therapy on 08/27/2018  Current Therapy: FOLFIRI -- started on 01/09/2019, s/p cycle12 -- d/c on 10/29/2019 Xeloda/Avastin -- cycle #1 to start on 11/05/2019   Interim History:  Zachary Willis is here today for follow-up.  Unfortunately, we had progressive disease.  We last saw him, which was a week ago, his CEA was all the way up to 360.  Back in late August it was 80.  We did do a CT scan on him.  Although there is no new disease, he does have growth of the disease in his liver and lungs.  He has had problems with blood counts with the FOLFIRI.  I think that we will going to have to make a change.  I think that oral Xeloda along with Avastin by would not be a bad idea.  I think that oral Xeloda should have a decent response rate.  I think that he would tolerate this okay.  We probably will have to do a little bit of a dosage reduction  He is on Coumadin.  We will have to watch the Coumadin level very closely.  He really does not have any problems with his heart.  He has had no problems with chest wall pain.  There is no cough or shortness of breath.  He has had no abdominal pain.  He has had no change in bowel or bladder habits.  There is been no bleeding.  He has had a little bit of leg swelling but this is chronic.  Overall, I would say his performance status is ECOG 1.  Medications:  Allergies as of 10/29/2019   No Known Allergies     Medication List       Accurate as of October 29, 2019 10:23 AM. If you have any questions, ask your nurse or doctor.        allopurinol 300 MG tablet Commonly known as: ZYLOPRIM Take 300 mg by mouth every morning.   atorvastatin 40 MG tablet Commonly known as: LIPITOR Take 40 mg by mouth daily.    carvedilol 6.25 MG tablet Commonly known as: COREG Take 6.25 mg by mouth 2 (two) times daily with a meal.   [facility] Aspirin Adult Low Dose 81 MG chewable tablet Generic drug: aspirin Chew 81 mg by mouth daily.   dexamethasone 4 MG tablet Commonly known as: DECADRON Take 2 tablets (8 mg total) by mouth daily. Start the day after chemo for 2 days.   ferrous sulfate 325 (65 FE) MG tablet Take 325 mg by mouth daily.   furosemide 40 MG tablet Commonly known as: LASIX Take 40 mg by mouth daily.   hydrocortisone 10 MG tablet Commonly known as: CORTEF Take 1-2 tablets (10-20 mg total) by mouth See admin instructions. 20mg  in AM 10mg  in PM   ibuprofen 200 MG tablet Commonly known as: ADVIL Take 400 mg by mouth every 6 (six) hours as needed for headache or mild pain.   Klor-Con 10 10 MEQ tablet Generic drug: potassium chloride Take 20 mEq by mouth daily.   levothyroxine 88 MCG tablet Commonly known as: SYNTHROID Take 88 mcg by mouth daily before breakfast.   lidocaine-prilocaine cream Commonly known as: EMLA Apply to affected area once What changed:   how much to take  how to take this  when to take this  reasons to take this   loperamide 2 MG tablet Commonly known as: Imodium A-D Take 1 tablet (2 mg total) by mouth 4 (four) times daily as needed. Take 2 at diarrhea onset , then 1 every 2hr until 12hrs with no BM. May take 2 every 4hrs at night. If diarrhea recurs repeat.   NASAL [place] NA Place 1 spray into the nose daily.   ondansetron 8 MG tablet Commonly known as: ZOFRAN Take 1 tablet (8 mg total) by mouth 2 (two) times daily as needed for refractory nausea / vomiting. Start on day 3 after chemotherapy.   pantoprazole 40 MG tablet Commonly known as: PROTONIX Take 40 mg by mouth every morning.   polyvinyl alcohol 1.4 % ophthalmic solution Commonly known as: LIQUIFILM TEARS Place 1 drop into both eyes daily as needed for dry eyes.   prochlorperazine 10 MG  tablet Commonly known as: COMPAZINE Take 1 tablet (10 mg total) by mouth every 6 (six) hours as needed (NAUSEA).   testosterone 4 MG/24HR Pt24 patch Commonly known as: ANDRODERM Place 1 patch onto the skin daily.   warfarin 5 MG tablet Commonly known as: COUMADIN Take 2.5-5 mg by mouth See admin instructions. 5mg  alternating with 2.5mg  every other day       Allergies: No Known Allergies  Past Medical History, Surgical history, Social history, and Family History were reviewed and updated.  Review of Systems: Review of Systems  Constitutional: Negative.   HENT: Negative.   Eyes: Negative.   Respiratory: Negative.   Cardiovascular: Negative.   Gastrointestinal: Negative.   Genitourinary: Negative.   Musculoskeletal: Negative.   Skin: Negative.   Neurological: Negative.   Endo/Heme/Allergies: Negative.   Psychiatric/Behavioral: Negative.       Physical Exam:  weight is 242 lb 8 oz (110 kg). His oral temperature is 98.5 F (36.9 C). His blood pressure is 145/94 (abnormal) and his pulse is 70. His respiration is 18 and oxygen saturation is 99%.   Wt Readings from Last 3 Encounters:  10/29/19 242 lb 8 oz (110 kg)  10/22/19 245 lb (111.1 kg)  10/07/19 249 lb 1.9 oz (113 kg)    Physical Exam Vitals reviewed.  HENT:     Head: Normocephalic and atraumatic.  Eyes:     Pupils: Pupils are equal, round, and reactive to light.  Cardiovascular:     Rate and Rhythm: Normal rate and regular rhythm.     Heart sounds: Normal heart sounds.     Comments: Cardiac exam shows a regular rate and rhythm.  There is some occasional extra beat.  I do not hear any obvious atrial fibrillation. Pulmonary:     Effort: Pulmonary effort is normal.     Breath sounds: Normal breath sounds.  Abdominal:     General: Bowel sounds are normal.     Palpations: Abdomen is soft.     Comments: Abdominal exam shows laparotomy scars.  He has no fluid wave.  He has no guarding or rebound tenderness.   There is no palpable liver or spleen tip.  Musculoskeletal:        General: No tenderness or deformity. Normal range of motion.     Cervical back: Normal range of motion.  Lymphadenopathy:     Cervical: No cervical adenopathy.  Skin:    General: Skin is warm and dry.     Findings: No erythema or rash.  Neurological:     Mental Status: He  is alert and oriented to person, place, and time.  Psychiatric:        Behavior: Behavior normal.        Thought Content: Thought content normal.        Judgment: Judgment normal.    Lab Results  Component Value Date   WBC 6.9 10/29/2019   HGB 12.3 (L) 10/29/2019   HCT 39.7 10/29/2019   MCV 97.1 10/29/2019   PLT 76 (L) 10/29/2019   Lab Results  Component Value Date   FERRITIN 173 09/19/2019   IRON 39 (L) 09/19/2019   TIBC 240 (L) 09/19/2019   UIBC 201 09/19/2019   IRONPCTSAT 16 (L) 09/19/2019   Lab Results  Component Value Date   RBC 4.09 (L) 10/29/2019   No results found for: KPAFRELGTCHN, LAMBDASER, KAPLAMBRATIO No results found for: IGGSERUM, IGA, IGMSERUM No results found for: Odetta Pink, SPEI   Chemistry      Component Value Date/Time   NA 141 10/29/2019 0850   K 3.7 10/29/2019 0850   CL 103 10/29/2019 0850   CO2 31 10/29/2019 0850   BUN 16 10/29/2019 0850   CREATININE 0.86 10/29/2019 0850      Component Value Date/Time   CALCIUM 9.2 10/29/2019 0850   ALKPHOS 305 (H) 10/29/2019 0850   AST 38 10/29/2019 0850   ALT 41 10/29/2019 0850   BILITOT 1.2 10/29/2019 0850      Impression and Plan: Mr. Parlato is a very pleasant 74 yo caucasian gentleman with metastatic colon cancer.  He has progressive disease.  I think that the CEA is very telling for what is going on.  We will go ahead and plan for Xeloda along with Avastin.  I think this would be reasonable to try.  I believe that the CEA would definitely give [place] an idea as to what is going on and whether or not he is  responding.  I will dose his Xeloda at 2500 mg p.o. twice daily.  I think this is reasonable given all treatment that has had before.  I did tell him to make sure he uses moisturizer for his hands and feet.  We really just want to make sure his quality of life is good.  I know that he and his wife have always done a lot of traveling.  With the pandemic, they really have been isolated.  They did go to [place] recently.  They enjoyed it.  Mostly, the enjoy going on cruises.  We really would like to see him go on a cruise with his wife next year.  We will try to get him plan to get the Xeloda started in a week or so.  I would like to get him back to see him sometime in 3 to 4 weeks.     [name], MD 10/26/202110:23 AM

## 2019-10-29 NOTE — Progress Notes (Signed)
DISCONTINUE ON PATHWAY REGIMEN - Colorectal     A cycle is every 14 days:     Irinotecan      Leucovorin      Fluorouracil      Fluorouracil   **Always confirm dose/schedule in your pharmacy ordering system**  REASON: Disease Progression PRIOR TREATMENT: [id number]: FOLFIRI TREATMENT RESPONSE: Partial Response (PR)  START OFF PATHWAY REGIMEN - Colorectal   [id number]:Bevacizumab 7.5 mg/kg q21 days:   A cycle is every 21 days:     Bevacizumab-xxxx   **Always confirm dose/schedule in your pharmacy ordering system**  Patient Characteristics: Distant Metastases, Nonsurgical Candidate, KRAS/NRAS Wild-Type (BRAF V600 Wild-Type/Unknown), Standard Cytotoxic Therapy, Third Line Standard Cytotoxic Therapy, No Prior Anti-EGFR Therapy Tumor Location: Colon Therapeutic Status: Distant Metastases Microsatellite/Mismatch Repair Status: Unknown BRAF Mutation Status: Wild-Type (no mutation) KRAS/NRAS Mutation Status: Wild-Type (no mutation) Preferred Therapy Approach: Standard Cytotoxic Therapy Standard Cytotoxic Line of Therapy: Third Building services engineer Cytotoxic Therapy Intent of Therapy: Non-Curative / Palliative Intent, Discussed with Patient

## 2019-10-30 ENCOUNTER — Telehealth: Payer: Self-pay | Admitting: Pharmacist

## 2019-10-30 DIAGNOSIS — C787 Secondary malignant neoplasm of liver and intrahepatic bile duct: Secondary | ICD-10-CM

## 2019-10-30 DIAGNOSIS — C189 Malignant neoplasm of colon, unspecified: Secondary | ICD-10-CM

## 2019-10-30 NOTE — Telephone Encounter (Signed)
Oral Oncology Pharmacist Encounter  Received new prescription for Xeloda (capecitabine) for the treatment of metastatic colon cancer in conjunction with bevacizumab, planned duration until disease progression or unacceptable drug toxicity.  CMP from 10/29/19 assessed, no relevant lab abnormalities. Prescription dose and frequency assessed.   Current medication list in Epic reviewed, several DDIs with capecitabine identified: -Allopurinol: allopurinol may decrease the concentration of capecitabine active metabolites. Combination should be avoided, category X interaction **Discussed interaction with patient, he is taking the allopurinol for gout. He knows we would like for him to sop his allopurinol when starting the capecitabine. He plans on talking to his PCP able another medication for gout management. -Warfarin: capecitabine may increase the concentration of warfarin, recommend increased INR monitoring.  **Patient has already called his PCP Dr. Marolyn Hammock about the warfarin interaction, they prescribe and monitor the warfarin. They are going to being him in for more frequent INR checks.  Evaluated chart and no patient barriers to medication adherence identified.   Prescription has been e-scribed to the [facility] for benefits analysis and approval.  Oral Oncology Clinic will continue to follow for insurance authorization, copayment issues, initial counseling and start date.  Darl Pikes, PharmD, BCPS, BCOP, CPP Hematology/Oncology Clinical Pharmacist Practitioner [facility]/HP/AP [facility] [phone]  10/30/2019 3:46 PM

## 2019-10-31 ENCOUNTER — Telehealth: Payer: Self-pay | Admitting: Pharmacy Technician

## 2019-10-31 ENCOUNTER — Telehealth: Payer: Self-pay | Admitting: Hematology & Oncology

## 2019-10-31 MED ORDER — CAPECITABINE 500 MG PO TABS: 2500.0000 mg | ORAL_TABLET | Freq: Two times a day (BID) | ORAL | 5 refills | Status: DC

## 2019-10-31 NOTE — Telephone Encounter (Addendum)
Oral Oncology Patient Advocate Encounter   Received notification from [facility]) that prior authorization for Xeloda (Capecitabine) is required.   PA could not be submitted on CoverMyMeds.   Had to call [phone] to complete part B prior authorization.  PA number: [id number] PA approved from 10/31/19-10/30/20.  Copay is $612.92 for 21 day supply of medication. (20% copay).    Oral Oncology Clinic will continue to follow.  [place] Patient [place] Phone [phone] Fax [phone] 10/31/2019 1:49 PM

## 2019-10-31 NOTE — Telephone Encounter (Signed)
Called and spoke with patient regarding appointments scheduled per 10/26 los

## 2019-10-31 NOTE — Telephone Encounter (Signed)
Oral Oncology Patient Advocate Encounter  Spoke with patient over the phone about applying for assistance through [place] to reduce out of pocket expense for Xeloda to $0.00  Patient portion of application completed and faxed to [phone].  Once the MD portion is completed, I will fax to [place] as well.  [employer] patient assistance phone number for follow up is [phone].   This encounter will be updated until final determination.   [place] Patient [place] Phone [phone] Fax [phone] 10/31/2019 4:29 PM

## 2019-11-04 NOTE — Telephone Encounter (Signed)
Oral Oncology Patient Advocate Encounter  MD portion of application was faxed on 11/01/19.  [place] Patient [place] Phone [phone] Fax [phone] 11/04/2019 8:58 AM

## 2019-11-05 NOTE — Telephone Encounter (Signed)
Oral Oncology Patient Advocate Encounter  [place] to check status of application and rep verified insurance and info over the phone.  She will send message for an internal benefits investigation to be completed and we should have a determination within 24-48 hours.  I will call back on Thursday afternoon if I have not received any information.  [place] Patient [place] Phone [phone] Fax [phone] 11/05/2019 2:18 PM

## 2019-11-05 NOTE — Telephone Encounter (Signed)
Oral Chemotherapy Pharmacist Encounter  Patient Education I spoke with patient for overview of new oral chemotherapy medication: Xeloda (capecitabine) for the treatment of metastatic colon cancer in conjunction with bevacizumab, planned duration until disease progression or unacceptable drug toxicity.  Pt is doing well. Counseled patient's wife on administration, dosing, side effects, monitoring, drug-food interactions, safe handling, storage, and disposal.  Patient will take take 5 tablets (2,500 mg total) by mouth 2 (two) times daily after a meal. Take for 14 days, then hold for 7 days. Repeat every 21 days.   Side effects include but not limited to: diarrhea, myelosuppression, stomatitis, and hand foot syndrome.    Reviewed with patient importance of keeping a medication schedule and plan for any missed doses. Patient's wife would like a calendar to help keep track of days to take Xeloda.   Ms. Borum voiced understanding and appreciation. All questions answered.  Provided patient with [facility] phone number. Patient knows to call the office with questions or concerns. Oral Chemotherapy Navigation Clinic will continue to follow.  Of note, did provide education on common side effects/monitoring of bevacizumab.   Eddie Candle, PharmD PGY2 Hematology/Oncology Pharmacy Resident Oral Chemotherapy [facility] 11/05/2019 10:13 AM

## 2019-11-06 ENCOUNTER — Other Ambulatory Visit: Payer: Self-pay

## 2019-11-06 DIAGNOSIS — C189 Malignant neoplasm of colon, unspecified: Secondary | ICD-10-CM

## 2019-11-07 ENCOUNTER — Other Ambulatory Visit: Payer: Self-pay | Admitting: Hematology & Oncology

## 2019-11-07 ENCOUNTER — Inpatient Hospital Stay: Payer: Medicare HMO | Attending: Hematology & Oncology

## 2019-11-07 ENCOUNTER — Other Ambulatory Visit: Payer: Self-pay

## 2019-11-07 ENCOUNTER — Inpatient Hospital Stay: Payer: Medicare HMO

## 2019-11-07 VITALS — BP 141/85 | HR 56 | Temp 98.2°F | Resp 17

## 2019-11-07 DIAGNOSIS — Z79899 Other long term (current) drug therapy: Secondary | ICD-10-CM | POA: Insufficient documentation

## 2019-11-07 DIAGNOSIS — C787 Secondary malignant neoplasm of liver and intrahepatic bile duct: Secondary | ICD-10-CM

## 2019-11-07 DIAGNOSIS — C189 Malignant neoplasm of colon, unspecified: Secondary | ICD-10-CM | POA: Insufficient documentation

## 2019-11-07 DIAGNOSIS — I480 Paroxysmal atrial fibrillation: Secondary | ICD-10-CM

## 2019-11-07 DIAGNOSIS — Z7901 Long term (current) use of anticoagulants: Secondary | ICD-10-CM | POA: Diagnosis not present

## 2019-11-07 DIAGNOSIS — Z5112 Encounter for antineoplastic immunotherapy: Secondary | ICD-10-CM | POA: Diagnosis present

## 2019-11-07 LAB — CBC WITH DIFFERENTIAL (CANCER CENTER ONLY)
Abs Immature Granulocytes: 0.1 10*3/uL — ABNORMAL HIGH (ref 0.00–0.07)
Basophils Absolute: 0 10*3/uL (ref 0.0–0.1)
Basophils Relative: 1 %
Eosinophils Absolute: 0.2 10*3/uL (ref 0.0–0.5)
Eosinophils Relative: 2 %
HCT: 38.9 % — ABNORMAL LOW (ref 39.0–52.0)
Hemoglobin: 12 g/dL — ABNORMAL LOW (ref 13.0–17.0)
Immature Granulocytes: 2 %
Lymphocytes Relative: 15 %
Lymphs Abs: 1 10*3/uL (ref 0.7–4.0)
MCH: 29.3 pg (ref 26.0–34.0)
MCHC: 30.8 g/dL (ref 30.0–36.0)
MCV: 95.1 fL (ref 80.0–100.0)
Monocytes Absolute: 0.4 10*3/uL (ref 0.1–1.0)
Monocytes Relative: 7 %
Neutro Abs: 4.8 10*3/uL (ref 1.7–7.7)
Neutrophils Relative %: 73 %
Platelet Count: 93 10*3/uL — ABNORMAL LOW (ref 150–400)
RBC: 4.09 MIL/uL — ABNORMAL LOW (ref 4.22–5.81)
RDW: 14.9 % (ref 11.5–15.5)
WBC Count: 6.5 10*3/uL (ref 4.0–10.5)
nRBC: 0 % (ref 0.0–0.2)

## 2019-11-07 LAB — CMP (CANCER CENTER ONLY)
ALT: 39 U/L (ref 0–44)
AST: 33 U/L (ref 15–41)
Albumin: 3.3 g/dL — ABNORMAL LOW (ref 3.5–5.0)
Alkaline Phosphatase: 272 U/L — ABNORMAL HIGH (ref 38–126)
Anion gap: 7 (ref 5–15)
BUN: 20 mg/dL (ref 8–23)
CO2: 31 mmol/L (ref 22–32)
Calcium: 9.3 mg/dL (ref 8.9–10.3)
Chloride: 103 mmol/L (ref 98–111)
Creatinine: 0.95 mg/dL (ref 0.61–1.24)
GFR, Estimated: >60 mL/min (ref >60)
Glucose, Bld: 261 mg/dL — ABNORMAL HIGH (ref 70–99)
Potassium: 3.5 mmol/L (ref 3.5–5.1)
Sodium: 141 mmol/L (ref 135–145)
Total Bilirubin: 1 mg/dL (ref 0.3–1.2)
Total Protein: 6.1 g/dL — ABNORMAL LOW (ref 6.5–8.1)

## 2019-11-07 LAB — PROTIME-INR
INR: 2.1 — ABNORMAL HIGH (ref 0.8–1.2)
Prothrombin Time: 22.6 seconds — ABNORMAL HIGH (ref 11.4–15.2)

## 2019-11-07 LAB — TOTAL PROTEIN, URINE DIPSTICK: Protein, ur: 1 mg/dL

## 2019-11-07 MED ORDER — SODIUM CHLORIDE 0.9 % IV SOLN
7.5000 mg/kg | Freq: Once | INTRAVENOUS | Status: AC
  Administered 2019-11-07: 800 mg via INTRAVENOUS
  Filled 2019-11-07: qty 32

## 2019-11-07 MED ORDER — SODIUM CHLORIDE 0.9% FLUSH
10.0000 mL | INTRAVENOUS | Status: DC | PRN
  Administered 2019-11-07: 10 mL
  Filled 2019-11-07: qty 10

## 2019-11-07 MED ORDER — SODIUM CHLORIDE 0.9 % IV SOLN
Freq: Once | INTRAVENOUS | Status: AC
  Filled 2019-11-07: qty 250

## 2019-11-07 MED ORDER — HEPARIN SOD (PORK) LOCK FLUSH 100 UNIT/ML IV SOLN
500.0000 [IU] | Freq: Once | INTRAVENOUS | Status: AC | PRN
  Administered 2019-11-07: 500 [IU]
  Filled 2019-11-07: qty 5

## 2019-11-07 NOTE — Progress Notes (Signed)
Pt discharged in no apparent distress. Pt left ambulatory without assistance. Pt aware of discharge instructions and verbalized understanding and had no further questions.  

## 2019-11-07 NOTE — Telephone Encounter (Signed)
Oral Oncology Patient Advocate Encounter  Received notification from [facility] Patient Assistance program that patient has been successfully enrolled into their program to receive Xeloda from the manufacturer at $0 out of pocket until therapy discontinued, no longer meets eligibility requirements, or health insurance or financial status changes.   Specialty Pharmacy that will dispense medication is [facility]  [phone]).  Patient knows to call the office with questions or concerns.   Oral Oncology Clinic will continue to follow.  [place] Patient [place] Phone [phone] Fax [phone] 11/07/2019 1:29 PM

## 2019-11-07 NOTE — Patient Instructions (Signed)
Bevacizumab injection What is this medicine? BEVACIZUMAB (be va SIZ yoo mab) is a monoclonal antibody. It is used to treat many types of cancer. This medicine may be used for other purposes; ask your health care provider or pharmacist if you have questions. COMMON BRAND NAME(S): Avastin, MVASI, Zirabev What should I tell my health care provider before I take this medicine? They need to know if you have any of these conditions:  diabetes  heart disease  high blood pressure  history of coughing up blood  prior anthracycline chemotherapy (e.g., doxorubicin, daunorubicin, epirubicin)  recent or ongoing radiation therapy  recent or planning to have surgery  stroke  an unusual or allergic reaction to bevacizumab, hamster proteins, mouse proteins, other medicines, foods, dyes, or preservatives  pregnant or trying to get pregnant  breast-feeding How should I use this medicine? This medicine is for infusion into a vein. It is given by a health care professional in a hospital or clinic setting. Talk to your pediatrician regarding the use of this medicine in children. Special care may be needed. Overdosage: If you think you have taken too much of this medicine contact a poison control center or emergency room at once. NOTE: This medicine is only for you. Do not share this medicine with others. What if I miss a dose? It is important not to miss your dose. Call your doctor or health care professional if you are unable to keep an appointment. What may interact with this medicine? Interactions are not expected. This list may not describe all possible interactions. Give your health care provider a list of all the medicines, herbs, non-prescription drugs, or dietary supplements you use. Also tell them if you smoke, drink alcohol, or use illegal drugs. Some items may interact with your medicine. What should I watch for while using this medicine? Your condition will be monitored carefully while  you are receiving this medicine. You will need important blood work and urine testing done while you are taking this medicine. This medicine may increase your risk to bruise or bleed. Call your doctor or health care professional if you notice any unusual bleeding. Before having surgery, talk to your health care provider to make sure it is ok. This drug can increase the risk of poor healing of your surgical site or wound. You will need to stop this drug for 28 days before surgery. After surgery, wait at least 28 days before restarting this drug. Make sure the surgical site or wound is healed enough before restarting this drug. Talk to your health care provider if questions. Do not become pregnant while taking this medicine or for 6 months after stopping it. Women should inform their doctor if they wish to become pregnant or think they might be pregnant. There is a potential for serious side effects to an unborn child. Talk to your health care professional or pharmacist for more information. Do not breast-feed an infant while taking this medicine and for 6 months after the last dose. This medicine has caused ovarian failure in some women. This medicine may interfere with the ability to have a child. You should talk to your doctor or health care professional if you are concerned about your fertility. What side effects may I notice from receiving this medicine? Side effects that you should report to your doctor or health care professional as soon as possible:  allergic reactions like skin rash, itching or hives, swelling of the face, lips, or tongue  chest pain or chest tightness    chills  coughing up blood  high fever  seizures  severe constipation  signs and symptoms of bleeding such as bloody or black, tarry stools; red or dark-brown urine; spitting up blood or brown material that looks like coffee grounds; red spots on the skin; unusual bruising or bleeding from the eye, gums, or nose  signs  and symptoms of a blood clot such as breathing problems; chest pain; severe, sudden headache; pain, swelling, warmth in the leg  signs and symptoms of a stroke like changes in vision; confusion; trouble speaking or understanding; severe headaches; sudden numbness or weakness of the face, arm or leg; trouble walking; dizziness; loss of balance or coordination  stomach pain  sweating  swelling of legs or ankles  vomiting  weight gain Side effects that usually do not require medical attention (report to your doctor or health care professional if they continue or are bothersome):  back pain  changes in taste  decreased appetite  dry skin  nausea  tiredness This list may not describe all possible side effects. Call your doctor for medical advice about side effects. You may report side effects to FDA at 1-800-FDA-1088. Where should I keep my medicine? This drug is given in a hospital or clinic and will not be stored at home. NOTE: This sheet is a summary. It may not cover all possible information. If you have questions about this medicine, talk to your doctor, pharmacist, or health care provider.  2020 Elsevier/Gold Standard (2018-10-17 10:50:46)  

## 2019-11-08 ENCOUNTER — Telehealth: Payer: Self-pay | Admitting: *Deleted

## 2019-11-08 NOTE — Telephone Encounter (Signed)
Call received from patient's wife wanting to know if PT/INR will be done every week due to pt being on Coumadin and Xeloda.  Dr. Marin Olp notified.  Call placed back to patient's wife and pt.'s wife notified per order of Dr. Marin Olp that PT/INR will be done weekly until Dr. Marin Olp sees patient on 12/04/19.  Pt.'s wife appreciative of call back and has no further questions at this time.

## 2019-11-13 NOTE — Telephone Encounter (Signed)
Patient will receive Xeloda on 11/22/19 (earliest available by [facility]).

## 2019-11-14 ENCOUNTER — Other Ambulatory Visit: Payer: Self-pay

## 2019-11-14 ENCOUNTER — Inpatient Hospital Stay: Payer: Medicare HMO

## 2019-11-14 ENCOUNTER — Telehealth: Payer: Self-pay | Admitting: *Deleted

## 2019-11-14 DIAGNOSIS — Z5112 Encounter for antineoplastic immunotherapy: Secondary | ICD-10-CM | POA: Diagnosis not present

## 2019-11-14 DIAGNOSIS — I480 Paroxysmal atrial fibrillation: Secondary | ICD-10-CM

## 2019-11-14 LAB — PROTIME-INR
INR: 1.6 — ABNORMAL HIGH (ref 0.8–1.2)
Prothrombin Time: 18.8 seconds — ABNORMAL HIGH (ref 11.4–15.2)

## 2019-11-14 NOTE — Telephone Encounter (Signed)
-----   Message from [name], MD sent at 11/14/2019 12:39 PM EST ----- Call - the INR is actually a .little low!! I would not change the coumadin dose yet.  Let's see what the INR is next week.  [name]

## 2019-11-14 NOTE — Telephone Encounter (Signed)
Notified pt of results 

## 2019-11-20 ENCOUNTER — Other Ambulatory Visit: Payer: Self-pay | Admitting: Hematology & Oncology

## 2019-11-21 ENCOUNTER — Other Ambulatory Visit: Payer: Self-pay

## 2019-11-21 ENCOUNTER — Encounter: Payer: Self-pay | Admitting: *Deleted

## 2019-11-21 ENCOUNTER — Inpatient Hospital Stay: Payer: Medicare HMO

## 2019-11-21 DIAGNOSIS — Z5112 Encounter for antineoplastic immunotherapy: Secondary | ICD-10-CM | POA: Diagnosis not present

## 2019-11-21 DIAGNOSIS — I480 Paroxysmal atrial fibrillation: Secondary | ICD-10-CM

## 2019-11-21 LAB — PROTIME-INR
INR: 1.8 — ABNORMAL HIGH (ref 0.8–1.2)
Prothrombin Time: 20.1 seconds — ABNORMAL HIGH (ref 11.4–15.2)

## 2019-11-26 ENCOUNTER — Telehealth: Payer: Self-pay | Admitting: *Deleted

## 2019-11-26 ENCOUNTER — Other Ambulatory Visit: Payer: Self-pay | Admitting: *Deleted

## 2019-11-26 MED ORDER — TELMISARTAN 40 MG PO TABS: 40.0000 mg | ORAL_TABLET | Freq: Every day | ORAL | 2 refills | Status: DC

## 2019-11-26 NOTE — Telephone Encounter (Signed)
Call received from patient's wife Zachary Willis stating that pt.'s BP has been elevated to 154/99, 157/102 and this morning, 174/101.  Pt is not having any pain and is taking Coreg 6.25 mg BID as ordered per pt.'s PCP.  Dr. Marin Olp notified.  Call placed back to patient's wife to inform her that Dr. Marin Olp would like for pt to start Micardis 40 mg daily along with the Coreg 6.25 mg BID.  Teach back done.  [facility]ardis sent to the [facility] on [address] in [place] per [employer] request.

## 2019-11-27 ENCOUNTER — Other Ambulatory Visit: Payer: Self-pay

## 2019-11-27 ENCOUNTER — Inpatient Hospital Stay: Payer: Medicare HMO

## 2019-11-27 ENCOUNTER — Other Ambulatory Visit: Payer: Medicare HMO

## 2019-11-27 DIAGNOSIS — Z5112 Encounter for antineoplastic immunotherapy: Secondary | ICD-10-CM | POA: Diagnosis not present

## 2019-11-27 DIAGNOSIS — I480 Paroxysmal atrial fibrillation: Secondary | ICD-10-CM

## 2019-11-27 LAB — PROTIME-INR
INR: 1.8 — ABNORMAL HIGH (ref 0.8–1.2)
Prothrombin Time: 20.1 seconds — ABNORMAL HIGH (ref 11.4–15.2)

## 2019-12-04 ENCOUNTER — Inpatient Hospital Stay: Payer: Medicare HMO

## 2019-12-04 ENCOUNTER — Telehealth: Payer: Self-pay

## 2019-12-04 ENCOUNTER — Inpatient Hospital Stay (HOSPITAL_BASED_OUTPATIENT_CLINIC_OR_DEPARTMENT_OTHER): Payer: Medicare HMO | Admitting: Hematology & Oncology

## 2019-12-04 ENCOUNTER — Other Ambulatory Visit: Payer: Self-pay

## 2019-12-04 ENCOUNTER — Inpatient Hospital Stay: Payer: Medicare HMO | Attending: Hematology & Oncology

## 2019-12-04 ENCOUNTER — Encounter: Payer: Self-pay | Admitting: Hematology & Oncology

## 2019-12-04 VITALS — BP 158/90 | HR 55

## 2019-12-04 VITALS — BP 147/94 | HR 66 | Temp 98.2°F | Resp 19 | Wt 242.0 lb

## 2019-12-04 DIAGNOSIS — C189 Malignant neoplasm of colon, unspecified: Secondary | ICD-10-CM | POA: Diagnosis present

## 2019-12-04 DIAGNOSIS — I4891 Unspecified atrial fibrillation: Secondary | ICD-10-CM | POA: Insufficient documentation

## 2019-12-04 DIAGNOSIS — Z79899 Other long term (current) drug therapy: Secondary | ICD-10-CM | POA: Insufficient documentation

## 2019-12-04 DIAGNOSIS — C787 Secondary malignant neoplasm of liver and intrahepatic bile duct: Secondary | ICD-10-CM

## 2019-12-04 DIAGNOSIS — R197 Diarrhea, unspecified: Secondary | ICD-10-CM | POA: Insufficient documentation

## 2019-12-04 DIAGNOSIS — Z7901 Long term (current) use of anticoagulants: Secondary | ICD-10-CM | POA: Insufficient documentation

## 2019-12-04 DIAGNOSIS — I480 Paroxysmal atrial fibrillation: Secondary | ICD-10-CM

## 2019-12-04 DIAGNOSIS — Z5112 Encounter for antineoplastic immunotherapy: Secondary | ICD-10-CM | POA: Insufficient documentation

## 2019-12-04 LAB — CMP (CANCER CENTER ONLY)
ALT: 44 U/L (ref 0–44)
AST: 39 U/L (ref 15–41)
Albumin: 3.6 g/dL (ref 3.5–5.0)
Alkaline Phosphatase: 203 U/L — ABNORMAL HIGH (ref 38–126)
Anion gap: 8 (ref 5–15)
BUN: 23 mg/dL (ref 8–23)
CO2: 34 mmol/L — ABNORMAL HIGH (ref 22–32)
Calcium: 10 mg/dL (ref 8.9–10.3)
Chloride: 104 mmol/L (ref 98–111)
Creatinine: 1.09 mg/dL (ref 0.61–1.24)
GFR, Estimated: >60 mL/min (ref >60)
Glucose, Bld: 183 mg/dL — ABNORMAL HIGH (ref 70–99)
Potassium: 4.1 mmol/L (ref 3.5–5.1)
Sodium: 146 mmol/L — ABNORMAL HIGH (ref 135–145)
Total Bilirubin: 1.6 mg/dL — ABNORMAL HIGH (ref 0.3–1.2)
Total Protein: 6.6 g/dL (ref 6.5–8.1)

## 2019-12-04 LAB — CBC WITH DIFFERENTIAL (CANCER CENTER ONLY)
Abs Immature Granulocytes: 0.05 10*3/uL (ref 0.00–0.07)
Basophils Absolute: 0.1 10*3/uL (ref 0.0–0.1)
Basophils Relative: 1 %
Eosinophils Absolute: 0.2 10*3/uL (ref 0.0–0.5)
Eosinophils Relative: 3 %
HCT: 43.9 % (ref 39.0–52.0)
Hemoglobin: 13.8 g/dL (ref 13.0–17.0)
Immature Granulocytes: 1 %
Lymphocytes Relative: 26 %
Lymphs Abs: 1.8 10*3/uL (ref 0.7–4.0)
MCH: 29.7 pg (ref 26.0–34.0)
MCHC: 31.4 g/dL (ref 30.0–36.0)
MCV: 94.6 fL (ref 80.0–100.0)
Monocytes Absolute: 0.4 10*3/uL (ref 0.1–1.0)
Monocytes Relative: 6 %
Neutro Abs: 4.3 10*3/uL (ref 1.7–7.7)
Neutrophils Relative %: 63 %
Platelet Count: 116 10*3/uL — ABNORMAL LOW (ref 150–400)
RBC: 4.64 MIL/uL (ref 4.22–5.81)
RDW: 15.6 % — ABNORMAL HIGH (ref 11.5–15.5)
WBC Count: 6.8 10*3/uL (ref 4.0–10.5)
nRBC: 0.3 % — ABNORMAL HIGH (ref 0.0–0.2)

## 2019-12-04 LAB — CEA (IN HOUSE-CHCC): CEA (CHCC-In House): 554.85 ng/mL — ABNORMAL HIGH (ref 0.00–5.00)

## 2019-12-04 LAB — PROTIME-INR
INR: 2.3 — ABNORMAL HIGH (ref 0.8–1.2)
Prothrombin Time: 24.1 seconds — ABNORMAL HIGH (ref 11.4–15.2)

## 2019-12-04 LAB — LACTATE DEHYDROGENASE: LDH: 385 U/L — ABNORMAL HIGH (ref 98–192)

## 2019-12-04 MED ORDER — SODIUM CHLORIDE 0.9% FLUSH
10.0000 mL | INTRAVENOUS | Status: DC | PRN
  Administered 2019-12-04: 10 mL
  Filled 2019-12-04: qty 10

## 2019-12-04 MED ORDER — HEPARIN SOD (PORK) LOCK FLUSH 100 UNIT/ML IV SOLN
500.0000 [IU] | Freq: Once | INTRAVENOUS | Status: AC | PRN
  Administered 2019-12-04: 500 [IU]
  Filled 2019-12-04: qty 5

## 2019-12-04 MED ORDER — SODIUM CHLORIDE 0.9 % IV SOLN
Freq: Once | INTRAVENOUS | Status: AC
  Filled 2019-12-04: qty 250

## 2019-12-04 MED ORDER — SODIUM CHLORIDE 0.9 % IV SOLN
7.5000 mg/kg | Freq: Once | INTRAVENOUS | Status: AC
  Administered 2019-12-04: 800 mg via INTRAVENOUS
  Filled 2019-12-04: qty 32

## 2019-12-04 NOTE — Progress Notes (Signed)
Pt discharged in no apparent distress. Pt left ambulatory with/without assistance. Pt aware of discharge instructions andverbalized understanding and had no further questions. 

## 2019-12-04 NOTE — Telephone Encounter (Signed)
appts made and printed  per 12/04/19 LOS.Marland KitchenMarland KitchenAOM

## 2019-12-04 NOTE — Patient Instructions (Signed)
Bevacizumab injection What is this medicine? BEVACIZUMAB (be va SIZ yoo mab) is a monoclonal antibody. It is used to treat many types of cancer. This medicine may be used for other purposes; ask your health care provider or pharmacist if you have questions. COMMON BRAND NAME(S): Avastin, MVASI, Zirabev What should I tell my health care provider before I take this medicine? They need to know if you have any of these conditions:  diabetes  heart disease  high blood pressure  history of coughing up blood  prior anthracycline chemotherapy (e.g., doxorubicin, daunorubicin, epirubicin)  recent or ongoing radiation therapy  recent or planning to have surgery  stroke  an unusual or allergic reaction to bevacizumab, hamster proteins, mouse proteins, other medicines, foods, dyes, or preservatives  pregnant or trying to get pregnant  breast-feeding How should I use this medicine? This medicine is for infusion into a vein. It is given by a health care professional in a hospital or clinic setting. Talk to your pediatrician regarding the use of this medicine in children. Special care may be needed. Overdosage: If you think you have taken too much of this medicine contact a poison control center or emergency room at once. NOTE: This medicine is only for you. Do not share this medicine with others. What if I miss a dose? It is important not to miss your dose. Call your doctor or health care professional if you are unable to keep an appointment. What may interact with this medicine? Interactions are not expected. This list may not describe all possible interactions. Give your health care provider a list of all the medicines, herbs, non-prescription drugs, or dietary supplements you use. Also tell them if you smoke, drink alcohol, or use illegal drugs. Some items may interact with your medicine. What should I watch for while using this medicine? Your condition will be monitored carefully while  you are receiving this medicine. You will need important blood work and urine testing done while you are taking this medicine. This medicine may increase your risk to bruise or bleed. Call your doctor or health care professional if you notice any unusual bleeding. Before having surgery, talk to your health care provider to make sure it is ok. This drug can increase the risk of poor healing of your surgical site or wound. You will need to stop this drug for 28 days before surgery. After surgery, wait at least 28 days before restarting this drug. Make sure the surgical site or wound is healed enough before restarting this drug. Talk to your health care provider if questions. Do not become pregnant while taking this medicine or for 6 months after stopping it. Women should inform their doctor if they wish to become pregnant or think they might be pregnant. There is a potential for serious side effects to an unborn child. Talk to your health care professional or pharmacist for more information. Do not breast-feed an infant while taking this medicine and for 6 months after the last dose. This medicine has caused ovarian failure in some women. This medicine may interfere with the ability to have a child. You should talk to your doctor or health care professional if you are concerned about your fertility. What side effects may I notice from receiving this medicine? Side effects that you should report to your doctor or health care professional as soon as possible:  allergic reactions like skin rash, itching or hives, swelling of the face, lips, or tongue  chest pain or chest tightness    chills  coughing up blood  high fever  seizures  severe constipation  signs and symptoms of bleeding such as bloody or black, tarry stools; red or dark-brown urine; spitting up blood or brown material that looks like coffee grounds; red spots on the skin; unusual bruising or bleeding from the eye, gums, or nose  signs  and symptoms of a blood clot such as breathing problems; chest pain; severe, sudden headache; pain, swelling, warmth in the leg  signs and symptoms of a stroke like changes in vision; confusion; trouble speaking or understanding; severe headaches; sudden numbness or weakness of the face, arm or leg; trouble walking; dizziness; loss of balance or coordination  stomach pain  sweating  swelling of legs or ankles  vomiting  weight gain Side effects that usually do not require medical attention (report to your doctor or health care professional if they continue or are bothersome):  back pain  changes in taste  decreased appetite  dry skin  nausea  tiredness This list may not describe all possible side effects. Call your doctor for medical advice about side effects. You may report side effects to FDA at 1-800-FDA-1088. Where should I keep my medicine? This drug is given in a hospital or clinic and will not be stored at home. NOTE: This sheet is a summary. It may not cover all possible information. If you have questions about this medicine, talk to your doctor, pharmacist, or health care provider.  2020 Elsevier/Gold Standard (2018-10-17 10:50:46)  

## 2019-12-04 NOTE — Progress Notes (Signed)
Hematology and Oncology Follow Up Visit  Zachary Willis [id number] 1945/03/19 74 y.o. 12/04/2019   Principle Diagnosis:  Metastatic colon cancer - progressive  Past Therapy: Status post RFA to the liver-11/21/2017  S/P Y-90 x 2 -- last therapy on 08/27/2018  Current Therapy: FOLFIRI -- started on 01/09/2019, s/p cycle12 -- d/c on 10/29/2019 Xeloda/Avastin -- cycle #1 to start on 11/23/2019   Interim History:  Zachary Willis is here today for follow-up.  He did start the Xeloda.  He started this last week.  So far, he has had no problems with the Xeloda.  He has high blood pressure from the Avastin.  We did start him on my card is at 40 mg a day.  His blood pressure has come down a little bit.  We will have to keep watching this.    I think that his last CEA was up quite a bit.  Hopefully this will be down a little bit.  I know he is not had a lot of treatment so far.  He had a very nice Thanksgiving.  He has had no problems with bowels or bladder.  There is no diarrhea.  He has had no bleeding or bruising.  His warfarin has not been affected by the Xeloda.  His INR today was 2.3.  He has had no problems with rashes.  He has had no leg swelling.  His heart has been doing well with the atrial fibrillation.  Overall, his performance status is ECOG 1.    Medications:  Allergies as of 12/04/2019   No Known Allergies     Medication List       Accurate as of December 04, 2019  9:25 AM. If you have any questions, ask your nurse or doctor.        acyclovir 400 MG tablet Commonly known as: ZOVIRAX TAKE 1 TABLET BY MOUTH TWICE A DAY   allopurinol 300 MG tablet Commonly known as: ZYLOPRIM Take 300 mg by mouth every morning.   atorvastatin 40 MG tablet Commonly known as: LIPITOR Take 40 mg by mouth daily.   capecitabine 500 MG tablet Commonly known as: XELODA Take 5 tablets (2,500 mg total) by mouth 2 (two) times daily after a meal. Take for 14 days, then hold for 7  days. Repeat every 21 days.   carvedilol 6.25 MG tablet Commonly known as: COREG Take 6.25 mg by mouth 2 (two) times daily with a meal.   [facility] Aspirin Adult Low Dose 81 MG chewable tablet Generic drug: aspirin Chew 81 mg by mouth daily.   ferrous sulfate 325 (65 FE) MG tablet Take 325 mg by mouth daily.   furosemide 40 MG tablet Commonly known as: LASIX Take 40 mg by mouth daily.   hydrocortisone 10 MG tablet Commonly known as: CORTEF Take 1-2 tablets (10-20 mg total) by mouth See admin instructions. 20mg  in AM 10mg  in PM   ibuprofen 200 MG tablet Commonly known as: ADVIL Take 400 mg by mouth every 6 (six) hours as needed for headache or mild pain.   Klor-Con 10 10 MEQ tablet Generic drug: potassium chloride Take 20 mEq by mouth daily.   levothyroxine 88 MCG tablet Commonly known as: SYNTHROID Take 88 mcg by mouth daily before breakfast.   NASAL WASH NA Place 1 spray into the nose daily.   pantoprazole 40 MG tablet Commonly known as: PROTONIX Take 40 mg by mouth every morning.   polyvinyl alcohol 1.4 % ophthalmic solution Commonly known as: LIQUIFILM  TEARS Place 1 drop into both eyes daily as needed for dry eyes.   telmisartan 40 MG tablet Commonly known as: Micardis Take 1 tablet (40 mg total) by mouth daily.   testosterone 4 MG/24HR Pt24 patch Commonly known as: ANDRODERM Place 1 patch onto the skin daily.   warfarin 5 MG tablet Commonly known as: COUMADIN Take 2.5-5 mg by mouth See admin instructions. 5mg  alternating with 2.5mg  every other day       Allergies: No Known Allergies  Past Medical History, Surgical history, Social history, and Family History were reviewed and updated.  Review of Systems: Review of Systems  Constitutional: Negative.   HENT: Negative.   Eyes: Negative.   Respiratory: Negative.   Cardiovascular: Negative.   Gastrointestinal: Negative.   Genitourinary: Negative.   Musculoskeletal: Negative.   Skin: Negative.    Neurological: Negative.   Endo/Heme/Allergies: Negative.   Psychiatric/Behavioral: Negative.       Physical Exam:  weight is 242 lb (109.8 kg). His oral temperature is 98.2 F (36.8 C). His blood pressure is 147/94 (abnormal) and his pulse is 66. His respiration is 19 and oxygen saturation is 98%.   Wt Readings from Last 3 Encounters:  12/04/19 242 lb (109.8 kg)  10/29/19 242 lb 8 oz (110 kg)  10/22/19 245 lb (111.1 kg)    Physical Exam Vitals reviewed.  HENT:     Head: Normocephalic and atraumatic.  Eyes:     Pupils: Pupils are equal, round, and reactive to light.  Cardiovascular:     Rate and Rhythm: Normal rate and regular rhythm.     Heart sounds: Normal heart sounds.     Comments: Cardiac exam shows a regular rate and rhythm.  There is some occasional extra beat.  I do not hear any obvious atrial fibrillation. Pulmonary:     Effort: Pulmonary effort is normal.     Breath sounds: Normal breath sounds.  Abdominal:     General: Bowel sounds are normal.     Palpations: Abdomen is soft.     Comments: Abdominal exam shows laparotomy scars.  He has no fluid wave.  He has no guarding or rebound tenderness.  There is no palpable liver or spleen tip.  Musculoskeletal:        General: No tenderness or deformity. Normal range of motion.     Cervical back: Normal range of motion.  Lymphadenopathy:     Cervical: No cervical adenopathy.  Skin:    General: Skin is warm and dry.     Findings: No erythema or rash.  Neurological:     Mental Status: He is alert and oriented to person, place, and time.  Psychiatric:        Behavior: Behavior normal.        Thought Content: Thought content normal.        Judgment: Judgment normal.    Lab Results  Component Value Date   WBC 6.8 12/04/2019   HGB 13.8 12/04/2019   HCT 43.9 12/04/2019   MCV 94.6 12/04/2019   PLT 116 (L) 12/04/2019   Lab Results  Component Value Date   FERRITIN 173 09/19/2019   IRON 39 (L) 09/19/2019   TIBC  240 (L) 09/19/2019   UIBC 201 09/19/2019   IRONPCTSAT 16 (L) 09/19/2019   Lab Results  Component Value Date   RBC 4.64 12/04/2019   No results found for: KPAFRELGTCHN, LAMBDASER, KAPLAMBRATIO No results found for: IGGSERUM, IGA, IGMSERUM No results found for: TOTALPROTELP, ALBUMINELP, A1GS, A2GS,  Tillman Sers, SPEI   Chemistry      Component Value Date/Time   NA 146 (H) 12/04/2019 0811   K 4.1 12/04/2019 0811   CL 104 12/04/2019 0811   CO2 34 (H) 12/04/2019 0811   BUN 23 12/04/2019 0811   CREATININE 1.09 12/04/2019 0811      Component Value Date/Time   CALCIUM 10.0 12/04/2019 0811   ALKPHOS 203 (H) 12/04/2019 0811   AST 39 12/04/2019 0811   ALT 44 12/04/2019 0811   BILITOT 1.6 (H) 12/04/2019 0811      Impression and Plan: Mr. Burch is a very pleasant 74 yo caucasian gentleman with metastatic colon cancer.  Hopefully, the Xeloda/Avastin combination will help.  We will have to watch out for the blood pressure.  He is on Coumadin already.  I think that the next CEA that we do want him will be important.  I noted with his labs that the alkaline phosphatase is coming down which also could be a marker for response.  His quality of life is doing okay with the Xeloda/Avastin combination.  I think this is important.  I want to make sure that he is able to function as he would like and enjoyed his family and hopefully be able to go on a cruise in the winter.    I would like to get him back to see him sometime in 3 weeks.     [name], MD 12/1/20219:25 AM

## 2019-12-21 ENCOUNTER — Emergency Department
Admission: EM | Admit: 2019-12-21 | Discharge: 2019-12-21 | Disposition: A | Discharge status: Home / Self Care | Payer: Medicare HMO | Source: Home / Self Care

## 2019-12-21 ENCOUNTER — Encounter: Payer: Self-pay | Admitting: Family Medicine

## 2019-12-21 ENCOUNTER — Other Ambulatory Visit: Payer: Self-pay

## 2019-12-21 DIAGNOSIS — Z5181 Encounter for therapeutic drug level monitoring: Secondary | ICD-10-CM | POA: Diagnosis not present

## 2019-12-21 DIAGNOSIS — K625 Hemorrhage of anus and rectum: Secondary | ICD-10-CM | POA: Diagnosis not present

## 2019-12-21 DIAGNOSIS — Z7901 Long term (current) use of anticoagulants: Secondary | ICD-10-CM | POA: Diagnosis not present

## 2019-12-21 NOTE — Discharge Instructions (Addendum)
Hold any further doses of coumadin until INR is back.

## 2019-12-21 NOTE — ED Provider Notes (Signed)
Zachary Willis CARE    CSN: [id number] Arrival date & time: 12/21/19  1136      History   Chief Complaint Chief Complaint  Patient presents with  . Rectal Bleeding    HPI Zachary Willis is a 74 y.o. male.   This 74 year old man is making his initial visit to [facility] urgent care.  He is complaining of rectal bleeding.  Pt presents to Urgent Care with c/o having a significant amount of blood in his stool yesterday. Reports he did not notice blood when he had a BM today. Pt also states he injured his R arm approx 1 week ago and the area continues to "seep blood." Pt is on Coumadin and held the dose yesterday evening.   Patient is being treated for colon cancer with spread to the liver.  He had the colon cancer resected.  Two weeks ago, patient saw Dr. Marin Olp and INR was 2.8    He was started on a new chemotherapeutic agent orally at that time.     Past Medical History:  Diagnosis Date  . Anticoagulated on Coumadin    chronic long term  . Antineoplastic chemotherapy induced anemia   . Chronic atrial fibrillation [facility])    cardiologist--  dr Curly Rim [facility] in [place])--- hx DVVC in 2006/  TEE 10-21-2015 (care everywhere)  ef 55-60%, moderate dilated RA, mild MR, RVSP 34mmHg  . CKD (chronic kidney disease), stage II   . Colon cancer metastasized to liver [facility])    current oncologist-- dr Marin Olp (previous oncologist at [facility] oncology)--- dx 12/ 2017,  Stage IIb ([id number]),  s/p  resection colon tumor 01-01-2016 ,  recurrent w/ liver mets via bx 08/ 2018  , had chemo then partial hepatectomy and completed chemo after surgery/ liver mets recurrence 08/ 2019  . Cushing's disease [facility])    endocriniologist-- dr Elisabeth Most [facility])  . Droopy eyelid, right    11-15-2017  . GERD (gastroesophageal reflux disease)   . Goals of care, counseling/discussion 08/30/2017  . Gout    11-15-2017 per pt last flare-up 2018  . History of benign pituitary tumor    resection  07-25-2013  . History of cancer chemotherapy    COMPLETED 02-20-2017  FOR  COLON CANCER WITH LIVER METS  . History of radiation therapy    03/ 2016  remainder of pituitary tumor  . Hypertension   . Hypertensive cardiovascular disease   . Hypothyroidism, secondary   . OA (osteoarthritis)   . Renal insufficiency   . Secondary adrenal insufficiency ([place])   . Secondary male hypogonadism   . Wears glasses   . Wears partial dentures    upper and lower    Patient Active Problem List   Diagnosis Date Noted  . AKI (acute kidney injury) ([place]) 09/12/2019  . Hypovolemic shock ([place]) 09/12/2019  . Severe sepsis ([place]) 09/12/2019  . Atrial fibrillation ([place]) 09/12/2019  . C. difficile diarrhea 09/12/2019  . Adrenal insufficiency ([place]) 09/12/2019  . Shock ([place]) 09/11/2019  . Colon cancer metastasized to liver ([place]) 08/30/2017  . Goals of care, counseling/discussion 08/30/2017  . NEPHROLITHIASIS 12/28/2009  . ABDOMINAL PAIN, RIGHT LOWER QUADRANT 12/28/2009    Past Surgical History:  Procedure Laterality Date  . COLOSTOMY TAKEDOWN  05-16-2016   @[facility]   . IR 3D INDEPENDENT WKST  07/17/2018  . IR ANGIOGRAM SELECTIVE EACH ADDITIONAL VESSEL  07/17/2018  . IR ANGIOGRAM SELECTIVE EACH ADDITIONAL VESSEL  07/17/2018  . IR ANGIOGRAM SELECTIVE EACH ADDITIONAL VESSEL  07/17/2018  .  IR ANGIOGRAM SELECTIVE EACH ADDITIONAL VESSEL  07/17/2018  . IR ANGIOGRAM SELECTIVE EACH ADDITIONAL VESSEL  07/17/2018  . IR ANGIOGRAM SELECTIVE EACH ADDITIONAL VESSEL  07/30/2018  . IR ANGIOGRAM SELECTIVE EACH ADDITIONAL VESSEL  07/30/2018  . IR ANGIOGRAM SELECTIVE EACH ADDITIONAL VESSEL  07/30/2018  . IR ANGIOGRAM SELECTIVE EACH ADDITIONAL VESSEL  07/30/2018  . IR ANGIOGRAM SELECTIVE EACH ADDITIONAL VESSEL  07/30/2018  . IR ANGIOGRAM SELECTIVE EACH ADDITIONAL VESSEL  08/27/2018  . IR ANGIOGRAM SELECTIVE EACH ADDITIONAL VESSEL  08/27/2018  . IR ANGIOGRAM VISCERAL SELECTIVE  07/17/2018  . IR ANGIOGRAM VISCERAL SELECTIVE  07/17/2018   . IR ANGIOGRAM VISCERAL SELECTIVE  08/27/2018  . IR EMBO ARTERIAL NOT HEMORR HEMANG INC GUIDE ROADMAPPING  07/17/2018  . IR EMBO TUMOR ORGAN ISCHEMIA INFARCT INC GUIDE ROADMAPPING  07/30/2018  . IR EMBO TUMOR ORGAN ISCHEMIA INFARCT INC GUIDE ROADMAPPING  08/27/2018  . IR RADIOLOGIST EVAL & MGMT  10/10/2017  . IR RADIOLOGIST EVAL & MGMT  11/07/2017  . IR RADIOLOGIST EVAL & MGMT  12/21/2017  . IR RADIOLOGIST EVAL & MGMT  06/19/2018  . IR RADIOLOGIST EVAL & MGMT  09/19/2018  . IR RADIOLOGIST EVAL & MGMT  12/18/2018  . IR US GUIDE VASC ACCESS RIGHT  07/17/2018  . IR US GUIDE VASC ACCESS RIGHT  07/30/2018  . IR US GUIDE VASC ACCESS RIGHT  08/27/2018  . OPEN PARTIAL HEPATECTOMY   01-13-2017    @[facility]    AND CHOLECYSTECTOMY  . RADIOFREQUENCY ABLATION N/A 11/22/2017   Procedure: CT MICROWAVE ABLATION LIVER;  Surgeon: Sandi Mariscal, MD;  Location: [facility] ORS;  Service: Anesthesiology;  Laterality: N/A;  . REVISION TOTAL KNEE ARTHROPLASTY Left 12/20/2013  . SUBTOTAL COLECTOMY  01-01-2016  @[facility]    W/  CREATION ILEOSTOMY AND UMBILICAL HERNIA REPAIR  . TOTAL KNEE ARTHROPLASTY Bilateral left 11-16-2010;  right 10-26-2011  . TRANSPHENOIDAL PITUITARY RESECTION  07-25-2013   @[facility]       Home Medications    Prior to Admission medications   Medication Sig Start Date End Date Taking? Authorizing Provider  acyclovir (ZOVIRAX) 400 MG tablet TAKE 1 TABLET BY MOUTH TWICE A DAY 11/20/19   [name], MD  allopurinol (ZYLOPRIM) 300 MG tablet Take 300 mg by mouth every morning.  01/28/10   [provider]  atorvastatin (LIPITOR) 40 MG tablet Take 40 mg by mouth daily. 03/22/18   [provider]  capecitabine (XELODA) 500 MG tablet Take 5 tablets (2,500 mg total) by mouth 2 (two) times daily after a meal. Take for 14 days, then hold for 7 days. Repeat every 21 days. 10/31/19   [name], MD  carvedilol (COREG) 6.25 MG tablet Take 6.25 mg by mouth 2 (two) times daily with a meal.  05/31/18    [provider]  [facility] ASPIRIN ADULT LOW DOSE 81 MG chewable tablet Chew 81 mg by mouth daily. 02/07/18   [provider]  ferrous sulfate 325 (65 FE) MG tablet Take 325 mg by mouth daily.    [provider]  furosemide (LASIX) 40 MG tablet Take 40 mg by mouth daily. 07/30/19   [provider]  hydrocortisone (CORTEF) 10 MG tablet Take 1-2 tablets (10-20 mg total) by mouth See admin instructions. 20mg  in AM 10mg  in PM 09/20/19   Patrecia Pour, MD  ibuprofen (ADVIL) 200 MG tablet Take 400 mg by mouth every 6 (six) hours as needed for headache or mild pain.    [provider]  levothyroxine (SYNTHROID,  LEVOTHROID) 88 MCG tablet Take 88 mcg by mouth daily before breakfast.  05/15/17   [provider]  NASAL [place] NA Place 1 spray into the nose daily.     [provider]  pantoprazole (PROTONIX) 40 MG tablet Take 40 mg by mouth every morning.  05/16/14   [provider]  polyvinyl alcohol (LIQUIFILM TEARS) 1.4 % ophthalmic solution Place 1 drop into both eyes daily as needed for dry eyes.     [provider]  potassium chloride (KLOR-CON 10) 10 MEQ tablet Take 20 mEq by mouth daily. 11/17/14   [provider]  telmisartan (MICARDIS) 40 MG tablet Take 1 tablet (40 mg total) by mouth daily. 11/26/19   [name], MD  testosterone (ANDRODERM) 4 MG/24HR PT24 patch Place 1 patch onto the skin daily.  04/17/15   [provider]  warfarin (COUMADIN) 5 MG tablet Take 2.5-5 mg by mouth See admin instructions. 5mg  alternating with 2.5mg  every other day 07/05/17   [provider]  prochlorperazine (COMPAZINE) 10 MG tablet Take 1 tablet (10 mg total) by mouth every 6 (six) hours as needed (NAUSEA). Patient not taking: Reported on 10/29/2019 12/18/18 10/29/19  [name], MD    Family History Family History  Problem Relation Age of Onset  . Hypertension Mother   . Cancer Mother   . Cancer Father      Social History Social History   Tobacco Use  . Smoking status: Never Smoker  . Smokeless tobacco: Never Used  Vaping Use  . Vaping Use: Never used  Substance Use Topics  . Alcohol use: Not Currently  . Drug use: Never     Allergies   Patient has no known allergies.   Review of Systems Review of Systems  Constitutional: Negative.   Gastrointestinal: Positive for anal bleeding.  Neurological: Negative.      Physical Exam Triage Vital Signs ED Triage Vitals  Enc Vitals Group     BP      Pulse      Resp      Temp      Temp src      SpO2      Weight      Height      Head Circumference      Peak Flow      Pain Score      Pain Loc      Pain Edu?      Excl. in [place]?    No data found.  Updated Vital Signs BP 112/79 (BP Location: Left Arm)   Pulse (!) 57   Temp 98.2 F (36.8 C) (Oral)   Resp 18   Ht 5\' 10"  (1.778 m)   Wt 108.9 kg   SpO2 96%   BMI 34.44 kg/m   Physical Exam Vitals and nursing note reviewed.  Constitutional:      Appearance: Normal appearance. He is obese.  Eyes:     General: No scleral icterus.    Conjunctiva/sclera: Conjunctivae normal.  Cardiovascular:     Rate and Rhythm: Normal rate. Rhythm irregular.     Heart sounds: No murmur heard.   Pulmonary:     Effort: Pulmonary effort is normal.     Breath sounds: Normal breath sounds.  Abdominal:     General: There is no distension.     Palpations: There is no mass.     Tenderness: There is no abdominal tenderness.  Musculoskeletal:  General: Normal range of motion.     Cervical back: Normal range of motion and neck supple.  Skin:    General: Skin is warm.     Findings: Bruising present.     Comments: Multiple large subcutaneous bruises on upper extremities.  Neurological:     General: No focal deficit present.     Mental Status: He is alert.  Psychiatric:        Mood and Affect: Mood normal.      UC Treatments / Results  Labs (all labs ordered are listed,  but only abnormal results are displayed) Labs Reviewed  PROTIME-INR    EKG   Radiology No results found.  Procedures Procedures (including critical care time)  Medications Ordered in UC Medications - No data to display  Initial Impression / Assessment and Plan / UC Course  I have reviewed the triage vital signs and the nursing notes.  Pertinent labs & imaging results that were available during my care of the patient were reviewed by me and considered in my medical decision making (see chart for details).    Final Clinical Impressions(s) / UC Diagnoses   Final diagnoses:  Rectal bleeding  Anticoagulation management encounter     Discharge Instructions     Hold any further doses of coumadin until INR is back.    ED Prescriptions    None     I have reviewed the PDMP during this encounter.   Robyn Haber, MD 12/21/19 1216

## 2019-12-21 NOTE — ED Triage Notes (Signed)
Pt presents to Urgent Care with c/o having a significant amount of blood in his stool yesterday. Reports he did not notice blood when he had a BM today. Pt also states he injured his R arm approx 1 week ago and the area continues to "seep blood." Pt is on Coumadin and held the dose yesterday evening.

## 2019-12-22 ENCOUNTER — Telehealth: Payer: Self-pay

## 2019-12-22 LAB — PROTIME-INR
INR: 12.4
Prothrombin Time: 117.3 s — ABNORMAL HIGH (ref 9.0–11.5)

## 2019-12-22 NOTE — Telephone Encounter (Signed)
Pt called to inquire about test results. Pt informed results were not available at this time and was informed to call back tomorrow.

## 2019-12-23 ENCOUNTER — Telehealth: Payer: Self-pay | Admitting: *Deleted

## 2019-12-23 ENCOUNTER — Inpatient Hospital Stay (HOSPITAL_BASED_OUTPATIENT_CLINIC_OR_DEPARTMENT_OTHER): Payer: Medicare HMO | Admitting: Family

## 2019-12-23 ENCOUNTER — Telehealth: Payer: Self-pay | Admitting: Family

## 2019-12-23 ENCOUNTER — Other Ambulatory Visit: Payer: Self-pay

## 2019-12-23 ENCOUNTER — Inpatient Hospital Stay: Payer: Medicare HMO

## 2019-12-23 ENCOUNTER — Other Ambulatory Visit: Payer: Self-pay | Admitting: Family

## 2019-12-23 ENCOUNTER — Other Ambulatory Visit: Payer: Self-pay | Admitting: *Deleted

## 2019-12-23 ENCOUNTER — Other Ambulatory Visit: Payer: Medicare HMO

## 2019-12-23 ENCOUNTER — Encounter: Payer: Self-pay | Admitting: Family

## 2019-12-23 ENCOUNTER — Ambulatory Visit: Payer: Medicare HMO | Admitting: Family

## 2019-12-23 ENCOUNTER — Ambulatory Visit: Payer: Medicare HMO

## 2019-12-23 VITALS — BP 104/67 | HR 74 | Temp 97.7°F | Resp 18 | Ht 70.0 in | Wt 241.8 lb

## 2019-12-23 DIAGNOSIS — R791 Abnormal coagulation profile: Secondary | ICD-10-CM

## 2019-12-23 DIAGNOSIS — I480 Paroxysmal atrial fibrillation: Secondary | ICD-10-CM

## 2019-12-23 DIAGNOSIS — C189 Malignant neoplasm of colon, unspecified: Secondary | ICD-10-CM

## 2019-12-23 DIAGNOSIS — Z5112 Encounter for antineoplastic immunotherapy: Secondary | ICD-10-CM | POA: Diagnosis not present

## 2019-12-23 DIAGNOSIS — D649 Anemia, unspecified: Secondary | ICD-10-CM

## 2019-12-23 DIAGNOSIS — Z95828 Presence of other vascular implants and grafts: Secondary | ICD-10-CM

## 2019-12-23 DIAGNOSIS — K921 Melena: Secondary | ICD-10-CM

## 2019-12-23 DIAGNOSIS — C787 Secondary malignant neoplasm of liver and intrahepatic bile duct: Secondary | ICD-10-CM | POA: Diagnosis not present

## 2019-12-23 LAB — CBC WITH DIFFERENTIAL (CANCER CENTER ONLY)
Abs Immature Granulocytes: 0.04 10*3/uL (ref 0.00–0.07)
Basophils Absolute: 0.1 10*3/uL (ref 0.0–0.1)
Basophils Relative: 1 %
Eosinophils Absolute: 0.2 10*3/uL (ref 0.0–0.5)
Eosinophils Relative: 3 %
HCT: 40.6 % (ref 39.0–52.0)
Hemoglobin: 13.3 g/dL (ref 13.0–17.0)
Immature Granulocytes: 1 %
Lymphocytes Relative: 36 %
Lymphs Abs: 2 10*3/uL (ref 0.7–4.0)
MCH: 30.6 pg (ref 26.0–34.0)
MCHC: 32.8 g/dL (ref 30.0–36.0)
MCV: 93.3 fL (ref 80.0–100.0)
Monocytes Absolute: 0.3 10*3/uL (ref 0.1–1.0)
Monocytes Relative: 5 %
Neutro Abs: 3 10*3/uL (ref 1.7–7.7)
Neutrophils Relative %: 54 %
Platelet Count: 159 10*3/uL (ref 150–400)
RBC: 4.35 MIL/uL (ref 4.22–5.81)
RDW: 17.4 % — ABNORMAL HIGH (ref 11.5–15.5)
WBC Count: 5.6 10*3/uL (ref 4.0–10.5)
nRBC: 0.4 % — ABNORMAL HIGH (ref 0.0–0.2)

## 2019-12-23 LAB — PROTIME-INR
INR: 6.2 (ref 0.8–1.2)
Prothrombin Time: 53.5 seconds — ABNORMAL HIGH (ref 11.4–15.2)

## 2019-12-23 LAB — SAMPLE TO BLOOD BANK

## 2019-12-23 MED ORDER — SODIUM CHLORIDE 0.9 % IV SOLN
2.5000 mg | Freq: Once | INTRAVENOUS | Status: AC
  Administered 2019-12-23: 2.5 mg via INTRAVENOUS
  Filled 2019-12-23: qty 0.25

## 2019-12-23 NOTE — Telephone Encounter (Signed)
Jory Ee NP notified of PT-6.2.  Order received for patient to get Vitamin K 2.5 mg IV today.

## 2019-12-23 NOTE — Patient Instructions (Addendum)
Pt discharged in no apparent distress. Pt left ambulatory without assistance.  Pt aware of discharge instructions and verbalized understanding and had no further questions.    Phytonadione, Vitamin K1 injection What is this medicine? PHYTONADIONE (fye toe na DYE one) is a man-made form of vitamin K. This medicine is used to treat vitamin K deficiency or bleeding problems caused by various disorders. This medicine is also given to newborn babies to prevent bleeding. This medicine may be used for other purposes; ask your health care provider or pharmacist if you have questions. COMMON BRAND NAME(S): AquaMEPHYTON What should I tell my health care provider before I take this medicine? They need to know if you have any of these conditions:  liver disease  an unusual or allergic reaction to phytonadione, other medicine, foods, dyes, or preservatives  pregnant or trying to get pregnant  breast-feeding How should I use this medicine? This medicine is for injection under the skin, into a muscle, or rarely, into a vein. It is usually given by a health care professional in a hospital or clinic setting. In newborn babies, this medicine is injected into the muscle as a one-time dose shortly after they are born. If you get this medicine at home, you will be taught how to prepare and give this medicine. Use exactly as directed. Take your medicine at regular intervals. Do not take your medicine more often than directed. It is important that you put your used needles and syringes in a special sharps container. Do not put them in a trash can. If you do not have a sharps container, call your pharmacist or healthcare provider to get one. Talk to your pediatrician regarding the use of this medicine in children. While this drug may be prescribed for children as young as newborns for selected conditions, precautions do apply. Overdosage: If you think you have taken too much of this medicine contact a poison control  center or emergency room at once. NOTE: This medicine is only for you. Do not share this medicine with others. What if I miss a dose? If you miss a dose, take it as soon as you can. If it is almost time for your next dose, take only that dose. Do not take double or extra doses. What may interact with this medicine?  medicines that treat or prevent blood clots like warfarin This list may not describe all possible interactions. Give your health care provider a list of all the medicines, herbs, non-prescription drugs, or dietary supplements you use. Also tell them if you smoke, drink alcohol, or use illegal drugs. Some items may interact with your medicine. What should I watch for while using this medicine? Visit your doctor or health care professional for regular checks on your progress. Your doctor or health care professional will schedule tests to make sure the medicine is working properly. What side effects may I notice from receiving this medicine? Side effects that you should report to your doctor or health care professional as soon as possible:  allergic reactions like skin rash, itching or hives, swelling of the face, lips, or tongue  bluish discoloration of lips, fingernails, or palms of hands  breathing problems  increased sweating  yellowing of the eyes or skin when this medicine is given to newborn babies Side effects that usually do not require medical attention (report to your doctor or health care professional if they continue or are bothersome):  changes in taste  dizziness  flushing of the face  pain, inflammation,  or swelling at site where injected This list may not describe all possible side effects. Call your doctor for medical advice about side effects. You may report side effects to FDA at 1-800-FDA-1088. Where should I keep my medicine? Keep out of the reach of children. Store at room temperature between 15 and 30 degrees C (59 and 86 degrees F). Do not freeze.  Protect from light. Store in tightly closed container and original carton until contents have been used. Throw away any unused medicine after the expiration date. NOTE: This sheet is a summary. It may not cover all possible information. If you have questions about this medicine, talk to your doctor, pharmacist, or health care provider.  2020 Elsevier/Gold Standard (2008-03-31 15:26:08)

## 2019-12-23 NOTE — Progress Notes (Signed)
Hematology and Oncology Follow Up Visit  Zachary Willis [id number] [birth year] 74 y.o. 12/23/2019   Principle Diagnosis:  Metastatic colon cancer - progressive  Past Therapy: Status post RFA to the liver-11/21/2017  S/P Y-90 x 2 -- last therapy on 08/27/2018  Current Therapy: FOLFIRI -- started on 01/09/2019, s/p cycle12 -- d/c on 10/29/2019 Xeloda/Avastin -- cycle #1 to start on 11/23/2019   Interim History:  Zachary Willis is here today for follow-up and symptom management. He was in the ED over the weekend for rectal blood loss. INR during his visit was 12.4. He has been holding his Coumadin.  INR today is 6.2. Hgb is stable at 13.3, MCV 93, WBC count 5.6 and platelets 159. He still notes some bright red blood on his toilet tissue and in the toilet bowl.  He also has a skin tear on his right upper arm that has oozed some blood but appears to be healing.  He bruises easily.  CEA 2 weeks ago was 554.  He notes SOB with any exertion.  No fever, chills, n/v, cough, rash, chest pain, palpitations, abdominal pain or changes in bowel or bladder habits.  He has noted numbness and tingling in his feet.  He feels lightheaded at times. No falls or syncopal episodes to report.  He has minimal fluid retention in his lower extremities. Pedal pulses are 2+.  He has a good appetite and has noted some abdominal bloating. He is staying well hydrated. His weight is stable at 241 lbs.    ECOG Performance Status: 1 - Symptomatic but completely ambulatory  Medications:  Allergies as of 12/23/2019   No Known Allergies     Medication List       Accurate as of December 23, 2019 10:21 AM. If you have any questions, ask your nurse or doctor.        acyclovir 400 MG tablet Commonly known as: ZOVIRAX TAKE 1 TABLET BY MOUTH TWICE A DAY   allopurinol 300 MG tablet Commonly known as: ZYLOPRIM Take 300 mg by mouth every morning.   atorvastatin 40 MG tablet Commonly known as: LIPITOR Take  40 mg by mouth daily.   capecitabine 500 MG tablet Commonly known as: XELODA Take 5 tablets (2,500 mg total) by mouth 2 (two) times daily after a meal. Take for 14 days, then hold for 7 days. Repeat every 21 days.   carvedilol 6.25 MG tablet Commonly known as: COREG Take 6.25 mg by mouth 2 (two) times daily with a meal.   [facility] Aspirin Adult Low Dose 81 MG chewable tablet Generic drug: aspirin Willis 81 mg by mouth daily.   ferrous sulfate 325 (65 FE) MG tablet Take 325 mg by mouth daily.   furosemide 40 MG tablet Commonly known as: LASIX Take 40 mg by mouth daily.   hydrocortisone 10 MG tablet Commonly known as: CORTEF Take 1-2 tablets (10-20 mg total) by mouth See admin instructions. 20mg  in AM 10mg  in PM   ibuprofen 200 MG tablet Commonly known as: ADVIL Take 400 mg by mouth every 6 (six) hours as needed for headache or mild pain.   [place]-Con 10 10 MEQ tablet Generic drug: potassium chloride Take 20 mEq by mouth daily.   levothyroxine 88 MCG tablet Commonly known as: SYNTHROID Take 88 mcg by mouth daily before breakfast.   NASAL [place] NA Place 1 spray into the nose daily.   pantoprazole 40 MG tablet Commonly known as: PROTONIX Take 40 mg by mouth every morning.  polyvinyl alcohol 1.4 % ophthalmic solution Commonly known as: LIQUIFILM TEARS Place 1 drop into both eyes daily as needed for dry eyes.   telmisartan 40 MG tablet Commonly known as: Micardis Take 1 tablet (40 mg total) by mouth daily.   testosterone 4 MG/24HR Pt24 patch Commonly known as: ANDRODERM Place 1 patch onto the skin daily.   warfarin 5 MG tablet Commonly known as: COUMADIN Take 2.5-5 mg by mouth See admin instructions. 5mg  alternating with 2.5mg  every other day       Allergies: No Known Allergies  Past Medical History, Surgical history, Social history, and Family History were reviewed and updated.  Review of Systems: All other 10 point review of systems is negative.   Physical  Exam:  vitals were not taken for this visit.   Wt Readings from Last 3 Encounters:  12/21/19 240 lb (108.9 kg)  12/04/19 242 lb (109.8 kg)  10/29/19 242 lb 8 oz (110 kg)    Ocular: Sclerae unicteric, pupils equal, round and reactive to light Ear-nose-throat: Oropharynx clear, dentition fair Lymphatic: No cervical or supraclavicular adenopathy Lungs no rales or rhonchi, good excursion bilaterally Heart regular rate and rhythm, no murmur appreciated Abd soft, nontender, positive bowel sounds MSK no focal spinal tenderness, no joint edema Neuro: non-focal, well-oriented, appropriate affect Breasts: Deferred   Lab Results  Component Value Date   WBC 5.6 12/23/2019   HGB 13.3 12/23/2019   HCT 40.6 12/23/2019   MCV 93.3 12/23/2019   PLT 159 12/23/2019   Lab Results  Component Value Date   FERRITIN 173 09/19/2019   IRON 39 (L) 09/19/2019   TIBC 240 (L) 09/19/2019   UIBC 201 09/19/2019   IRONPCTSAT 16 (L) 09/19/2019   Lab Results  Component Value Date   RBC 4.35 12/23/2019   No results found for: KPAFRELGTCHN, LAMBDASER, KAPLAMBRATIO No results found for: IGGSERUM, IGA, IGMSERUM No results found for: Kathrynn Ducking, MSPIKE, SPEI   Chemistry      Component Value Date/Time   NA 146 (H) 12/04/2019 0811   K 4.1 12/04/2019 0811   CL 104 12/04/2019 0811   CO2 34 (H) 12/04/2019 0811   BUN 23 12/04/2019 0811   CREATININE 1.09 12/04/2019 0811      Component Value Date/Time   CALCIUM 10.0 12/04/2019 0811   ALKPHOS 203 (H) 12/04/2019 0811   AST 39 12/04/2019 0811   ALT 44 12/04/2019 0811   BILITOT 1.6 (H) 12/04/2019 0811       Impression and Plan: Zachary Willis is a very pleasant 74yo caucasian gentleman with metastatic colon cancer. He is here today repeat lab work and Vitamin K intravenous infusion.  His INR is improved at 6.2. He will continue to hold his Coumadin.  We will also have him hold his Xeloda (he started week 2 of  this cycle today) until MD follow-up in 1 week.  We will see him again on Thursday for repeat labs and Vitamin K infusion if needed.  Patient right upper arm skin tear redressed with Telfa.  He was encouraged to contact our office with any questions or concerns. We can certainly see him sooner if needed.   Laverna Peace, NP 12/20/202110:21 AM

## 2019-12-23 NOTE — Telephone Encounter (Signed)
Call received from patient's wife, Zachary Willis stating that patient was at urgent care over the weekend with rectal bleeding with an INR of 12.4.  Dr. Marin Olp notified and order received for pt to come in today for labs and to see Dr. Marin Olp.  Message sent to scheduling.

## 2019-12-23 NOTE — Telephone Encounter (Signed)
Appointments scheduled calendar printed per 12/20 los 

## 2019-12-24 ENCOUNTER — Telehealth: Payer: Self-pay | Admitting: Emergency Medicine

## 2019-12-24 ENCOUNTER — Telehealth: Payer: Self-pay | Admitting: *Deleted

## 2019-12-24 NOTE — Telephone Encounter (Signed)
Return call to [facility] regarding lab value drawn on 12/21/19. Critical lab noted in chart on 12/22/19 at 1051 - INR 12.4. RN spoke w/ Dr Joseph Art via call on 12/22/19 when resulted- confirmed he had seen lab and follow up orders were in place. RBV lab results. See treatment plan.

## 2019-12-24 NOTE — Telephone Encounter (Signed)
Patients wife called with an update. Patient much better than yesterday.  Still had bout of dizzines this morning but feeling better this morning.  Has had 3 loose stools today.  Informed patient it is ok to take an Imodium for this.  Told patient to keep [place] updated with his symptoms.  To come back on Thursday for lab and infusion

## 2019-12-25 ENCOUNTER — Telehealth: Payer: Self-pay | Admitting: *Deleted

## 2019-12-25 ENCOUNTER — Other Ambulatory Visit: Payer: Self-pay

## 2019-12-25 ENCOUNTER — Other Ambulatory Visit: Payer: Self-pay | Admitting: *Deleted

## 2019-12-25 ENCOUNTER — Inpatient Hospital Stay: Payer: Medicare HMO

## 2019-12-25 DIAGNOSIS — K921 Melena: Secondary | ICD-10-CM

## 2019-12-25 DIAGNOSIS — E876 Hypokalemia: Secondary | ICD-10-CM

## 2019-12-25 DIAGNOSIS — Z5112 Encounter for antineoplastic immunotherapy: Secondary | ICD-10-CM | POA: Diagnosis not present

## 2019-12-25 DIAGNOSIS — E860 Dehydration: Secondary | ICD-10-CM

## 2019-12-25 LAB — CBC WITH DIFFERENTIAL (CANCER CENTER ONLY)
Abs Immature Granulocytes: 0.02 10*3/uL (ref 0.00–0.07)
Basophils Absolute: 0 10*3/uL (ref 0.0–0.1)
Basophils Relative: 1 %
Eosinophils Absolute: 0.1 10*3/uL (ref 0.0–0.5)
Eosinophils Relative: 2 %
HCT: 40.5 % (ref 39.0–52.0)
Hemoglobin: 13.4 g/dL (ref 13.0–17.0)
Immature Granulocytes: 0 %
Lymphocytes Relative: 40 %
Lymphs Abs: 2.2 10*3/uL (ref 0.7–4.0)
MCH: 30.5 pg (ref 26.0–34.0)
MCHC: 33.1 g/dL (ref 30.0–36.0)
MCV: 92.3 fL (ref 80.0–100.0)
Monocytes Absolute: 0.5 10*3/uL (ref 0.1–1.0)
Monocytes Relative: 10 %
Neutro Abs: 2.7 10*3/uL (ref 1.7–7.7)
Neutrophils Relative %: 47 %
Platelet Count: 197 10*3/uL (ref 150–400)
RBC: 4.39 MIL/uL (ref 4.22–5.81)
RDW: 17.7 % — ABNORMAL HIGH (ref 11.5–15.5)
WBC Count: 5.5 10*3/uL (ref 4.0–10.5)
nRBC: 0.4 % — ABNORMAL HIGH (ref 0.0–0.2)

## 2019-12-25 LAB — SAMPLE TO BLOOD BANK

## 2019-12-25 LAB — COMPREHENSIVE METABOLIC PANEL
ALT: 19 U/L (ref 0–44)
AST: 24 U/L (ref 15–41)
Albumin: 2.9 g/dL — ABNORMAL LOW (ref 3.5–5.0)
Alkaline Phosphatase: 101 U/L (ref 38–126)
Anion gap: 7 (ref 5–15)
BUN: 24 mg/dL — ABNORMAL HIGH (ref 8–23)
CO2: 29 mmol/L (ref 22–32)
Calcium: 9.1 mg/dL (ref 8.9–10.3)
Chloride: 105 mmol/L (ref 98–111)
Creatinine, Ser: 1.24 mg/dL (ref 0.61–1.24)
GFR, Estimated: >60 mL/min (ref >60)
Glucose, Bld: 226 mg/dL — ABNORMAL HIGH (ref 70–99)
Potassium: 4.9 mmol/L (ref 3.5–5.1)
Sodium: 141 mmol/L (ref 135–145)
Total Bilirubin: 3.2 mg/dL — ABNORMAL HIGH (ref 0.3–1.2)
Total Protein: 4.9 g/dL — ABNORMAL LOW (ref 6.5–8.1)

## 2019-12-25 LAB — PROTIME-INR
INR: 1.6 — ABNORMAL HIGH (ref 0.8–1.2)
Prothrombin Time: 18.6 seconds — ABNORMAL HIGH (ref 11.4–15.2)

## 2019-12-25 MED ORDER — SODIUM CHLORIDE 0.9 % IV SOLN
2.0000 g | Freq: Once | INTRAVENOUS | Status: DC
  Filled 2019-12-25: qty 20

## 2019-12-25 MED ORDER — SODIUM CHLORIDE 0.9 % IV SOLN
1000.0000 mL | INTRAVENOUS | Status: AC
  Administered 2019-12-25: 1000 mL via INTRAVENOUS
  Filled 2019-12-25: qty 1000

## 2019-12-25 MED ORDER — SODIUM CHLORIDE 0.9 % IV SOLN
2.0000 g | Freq: Once | INTRAVENOUS | Status: AC
  Administered 2019-12-25: 2 g via INTRAVENOUS
  Filled 2019-12-25: qty 20

## 2019-12-25 MED ORDER — SODIUM CHLORIDE 0.9% FLUSH
10.0000 mL | Freq: Once | INTRAVENOUS | Status: AC
  Administered 2019-12-25: 10 mL via INTRAVENOUS
  Filled 2019-12-25: qty 10

## 2019-12-25 MED ORDER — POTASSIUM CHLORIDE CRYS ER 20 MEQ PO TBCR
40.0000 meq | EXTENDED_RELEASE_TABLET | Freq: Once | ORAL | Status: AC
  Administered 2019-12-25: 40 meq via ORAL

## 2019-12-25 MED ORDER — HEPARIN SOD (PORK) LOCK FLUSH 100 UNIT/ML IV SOLN
500.0000 [IU] | Freq: Once | INTRAVENOUS | Status: AC
  Administered 2019-12-25: 500 [IU] via INTRAVENOUS
  Filled 2019-12-25: qty 5

## 2019-12-25 NOTE — Telephone Encounter (Signed)
Labs redrew peripherally per Lab.  Calcium, albumin and potassium WNL.  Dr Marin Olp notified.  Calcium dcd.  Patient does not need to come to appt tomorrow per Dr Marin Olp.  Appt is dcd.

## 2019-12-25 NOTE — Progress Notes (Signed)
Patients labs drawn via port-a cath today, calcium and potassium levels abnormal and verbal orders from [place] for IV calicum and oral potassium today while patient is here receiving fluids. Order placed by pharmacy, Potassium given, IV calcium started. Labs were redrawn by phlebotomist when drawn PTINR peripherally. Calcium and potassium came back normal. Iv calcium stopped. MD notified, Per MD. Hold potassium tonight, no infusion needed tomorrow. PT/INR results reported, orders for patient to restart coumadin, continue holding xeloda until appt on Tuesday with him. Patient notified of all information and verbalized understanding, restarted normal saline.

## 2019-12-25 NOTE — Telephone Encounter (Signed)
Received critical results from lab.  Calcium 5, Potassium 2.3, .  Dr Marin Olp aware.  Orders received.

## 2019-12-25 NOTE — Patient Instructions (Signed)
Pt discharged in no apparent distress. Pt left ambulatory with assistance.  Pt aware of discharge instructions and verbalized understanding and had no further questions.  

## 2019-12-25 NOTE — Telephone Encounter (Signed)
Received a call from [facility], patients wife stating that patient is very weak but no pain.  Has had 2 diarrhea stools but no blood.  Gave him Imodium.  Trying to eat and drink.  Spoke with Dr Marin Olp.  Wants to bring patient in for labs and IV fluids Wife agrees to bring him in asap.

## 2019-12-26 ENCOUNTER — Emergency Department (HOSPITAL_COMMUNITY): Payer: Medicare HMO

## 2019-12-26 ENCOUNTER — Inpatient Hospital Stay: Payer: Medicare HMO

## 2019-12-26 ENCOUNTER — Emergency Department (HOSPITAL_COMMUNITY)
Admission: EM | Admit: 2019-12-26 | Discharge: 2019-12-26 | Disposition: A | Discharge status: Home / Self Care | Payer: Medicare HMO | Attending: Emergency Medicine | Admitting: Emergency Medicine

## 2019-12-26 ENCOUNTER — Ambulatory Visit: Payer: Medicare HMO

## 2019-12-26 ENCOUNTER — Telehealth: Payer: Self-pay | Admitting: *Deleted

## 2019-12-26 ENCOUNTER — Ambulatory Visit: Payer: Medicare HMO | Admitting: Hematology & Oncology

## 2019-12-26 ENCOUNTER — Other Ambulatory Visit: Payer: Medicare HMO

## 2019-12-26 ENCOUNTER — Other Ambulatory Visit: Payer: Self-pay

## 2019-12-26 ENCOUNTER — Encounter (HOSPITAL_COMMUNITY): Payer: Self-pay

## 2019-12-26 DIAGNOSIS — Z96653 Presence of artificial knee joint, bilateral: Secondary | ICD-10-CM | POA: Insufficient documentation

## 2019-12-26 DIAGNOSIS — I2510 Atherosclerotic heart disease of native coronary artery without angina pectoris: Secondary | ICD-10-CM | POA: Insufficient documentation

## 2019-12-26 DIAGNOSIS — R1013 Epigastric pain: Secondary | ICD-10-CM | POA: Insufficient documentation

## 2019-12-26 DIAGNOSIS — Z9889 Other specified postprocedural states: Secondary | ICD-10-CM | POA: Insufficient documentation

## 2019-12-26 DIAGNOSIS — Z951 Presence of aortocoronary bypass graft: Secondary | ICD-10-CM | POA: Insufficient documentation

## 2019-12-26 DIAGNOSIS — E038 Other specified hypothyroidism: Secondary | ICD-10-CM | POA: Diagnosis not present

## 2019-12-26 DIAGNOSIS — R531 Weakness: Secondary | ICD-10-CM | POA: Insufficient documentation

## 2019-12-26 DIAGNOSIS — Z79899 Other long term (current) drug therapy: Secondary | ICD-10-CM | POA: Insufficient documentation

## 2019-12-26 DIAGNOSIS — C785 Secondary malignant neoplasm of large intestine and rectum: Secondary | ICD-10-CM | POA: Insufficient documentation

## 2019-12-26 DIAGNOSIS — R0602 Shortness of breath: Secondary | ICD-10-CM | POA: Insufficient documentation

## 2019-12-26 DIAGNOSIS — I129 Hypertensive chronic kidney disease with stage 1 through stage 4 chronic kidney disease, or unspecified chronic kidney disease: Secondary | ICD-10-CM | POA: Diagnosis not present

## 2019-12-26 DIAGNOSIS — Z7901 Long term (current) use of anticoagulants: Secondary | ICD-10-CM | POA: Diagnosis not present

## 2019-12-26 DIAGNOSIS — R1110 Vomiting, unspecified: Secondary | ICD-10-CM | POA: Insufficient documentation

## 2019-12-26 DIAGNOSIS — R197 Diarrhea, unspecified: Secondary | ICD-10-CM | POA: Diagnosis not present

## 2019-12-26 DIAGNOSIS — N182 Chronic kidney disease, stage 2 (mild): Secondary | ICD-10-CM | POA: Diagnosis not present

## 2019-12-26 DIAGNOSIS — I482 Chronic atrial fibrillation, unspecified: Secondary | ICD-10-CM | POA: Insufficient documentation

## 2019-12-26 DIAGNOSIS — K529 Noninfective gastroenteritis and colitis, unspecified: Secondary | ICD-10-CM

## 2019-12-26 DIAGNOSIS — R1084 Generalized abdominal pain: Secondary | ICD-10-CM | POA: Insufficient documentation

## 2019-12-26 LAB — PROTIME-INR
INR: 1.7 — ABNORMAL HIGH (ref 0.8–1.2)
Prothrombin Time: 19.2 seconds — ABNORMAL HIGH (ref 11.4–15.2)

## 2019-12-26 LAB — CBC
HCT: 39.7 % (ref 39.0–52.0)
Hemoglobin: 13 g/dL (ref 13.0–17.0)
MCH: 30.4 pg (ref 26.0–34.0)
MCHC: 32.7 g/dL (ref 30.0–36.0)
MCV: 92.8 fL (ref 80.0–100.0)
Platelets: 225 10*3/uL (ref 150–400)
RBC: 4.28 MIL/uL (ref 4.22–5.81)
RDW: 18.1 % — ABNORMAL HIGH (ref 11.5–15.5)
WBC: 7.9 10*3/uL (ref 4.0–10.5)
nRBC: 1 % — ABNORMAL HIGH (ref 0.0–0.2)

## 2019-12-26 LAB — URINALYSIS, ROUTINE W REFLEX MICROSCOPIC
Bilirubin Urine: NEGATIVE
Glucose, UA: NEGATIVE mg/dL
Hgb urine dipstick: NEGATIVE
Ketones, ur: NEGATIVE mg/dL
Leukocytes,Ua: NEGATIVE
Nitrite: NEGATIVE
Protein, ur: NEGATIVE mg/dL
Specific Gravity, Urine: 1.025 (ref 1.005–1.030)
pH: 5 (ref 5.0–8.0)

## 2019-12-26 LAB — COMPREHENSIVE METABOLIC PANEL
ALT: 26 U/L (ref 0–44)
AST: 31 U/L (ref 15–41)
Albumin: 3 g/dL — ABNORMAL LOW (ref 3.5–5.0)
Alkaline Phosphatase: 106 U/L (ref 38–126)
Anion gap: 10 (ref 5–15)
BUN: 21 mg/dL (ref 8–23)
CO2: 23 mmol/L (ref 22–32)
Calcium: 8.5 mg/dL — ABNORMAL LOW (ref 8.9–10.3)
Chloride: 104 mmol/L (ref 98–111)
Creatinine, Ser: 1.14 mg/dL (ref 0.61–1.24)
GFR, Estimated: >60 mL/min (ref >60)
Glucose, Bld: 238 mg/dL — ABNORMAL HIGH (ref 70–99)
Potassium: 3.6 mmol/L (ref 3.5–5.1)
Sodium: 137 mmol/L (ref 135–145)
Total Bilirubin: 3.3 mg/dL — ABNORMAL HIGH (ref 0.3–1.2)
Total Protein: 5.6 g/dL — ABNORMAL LOW (ref 6.5–8.1)

## 2019-12-26 LAB — LIPASE, BLOOD: Lipase: 22 U/L (ref 11–51)

## 2019-12-26 LAB — BRAIN NATRIURETIC PEPTIDE: B Natriuretic Peptide: 62.8 pg/mL (ref 0.0–100.0)

## 2019-12-26 LAB — TROPONIN I (HIGH SENSITIVITY): Troponin I (High Sensitivity): 12 ng/L (ref <18)

## 2019-12-26 MED ORDER — ONDANSETRON 8 MG PO TBDP
8.0000 mg | ORAL_TABLET | Freq: Once | ORAL | Status: AC
  Administered 2019-12-26: 8 mg via ORAL
  Filled 2019-12-26: qty 1

## 2019-12-26 MED ORDER — METRONIDAZOLE 500 MG PO TABS: 500.0000 mg | ORAL_TABLET | Freq: Three times a day (TID) | ORAL | 0 refills | Status: DC

## 2019-12-26 MED ORDER — METRONIDAZOLE 500 MG PO TABS
500.0000 mg | ORAL_TABLET | Freq: Once | ORAL | Status: AC
  Administered 2019-12-26: 500 mg via ORAL
  Filled 2019-12-26: qty 1

## 2019-12-26 MED ORDER — SODIUM CHLORIDE 0.9 % IV BOLUS
1000.0000 mL | Freq: Once | INTRAVENOUS | Status: AC
  Administered 2019-12-26: 1000 mL via INTRAVENOUS

## 2019-12-26 MED ORDER — ONDANSETRON HCL 8 MG PO TABS: 8.0000 mg | ORAL_TABLET | ORAL | 0 refills | Status: AC | PRN

## 2019-12-26 MED ORDER — CIPROFLOXACIN HCL 500 MG PO TABS
500.0000 mg | ORAL_TABLET | Freq: Once | ORAL | Status: AC
  Administered 2019-12-26: 500 mg via ORAL
  Filled 2019-12-26: qty 1

## 2019-12-26 MED ORDER — IOHEXOL 350 MG/ML SOLN
100.0000 mL | Freq: Once | INTRAVENOUS | Status: AC | PRN
  Administered 2019-12-26: 100 mL via INTRAVENOUS

## 2019-12-26 MED ORDER — HEPARIN SOD (PORK) LOCK FLUSH 100 UNIT/ML IV SOLN
500.0000 [IU] | Freq: Once | INTRAVENOUS | Status: AC
  Administered 2019-12-26: 500 [IU]
  Filled 2019-12-26: qty 5

## 2019-12-26 MED ORDER — CIPROFLOXACIN HCL 500 MG PO TABS: 500.0000 mg | ORAL_TABLET | Freq: Two times a day (BID) | ORAL | 0 refills | Status: DC

## 2019-12-26 NOTE — ED Triage Notes (Signed)
Patient is a cancer patient. Patient states he began vomiting this AM, SOB x 2 days, and has had diarrhea x 2-3 days.  Patient was seen at [facility] yesterday for rectal bleeding.

## 2019-12-26 NOTE — Discharge Instructions (Signed)
Begin taking Cipro and Flagyl as prescribed.  Take Zofran as prescribed as needed for nausea.  Follow-up with your primary doctor/oncologist in the next few days, and return to the ER if symptoms significantly worsen or change.

## 2019-12-26 NOTE — ED Notes (Signed)
Pt in bed resting with family at bedside.

## 2019-12-26 NOTE — Telephone Encounter (Signed)
Call received from patient's wife Meredith Mody stating that patient has had emesis times two today along with one watery stool.  Dr. Marin Olp notified and order received for pt to go to the [facility] now.  Pt.'s wife states that she will take pt to the [facility] now and is appreciative of assistance.

## 2019-12-26 NOTE — ED Provider Notes (Signed)
[facility] DEPT Provider Note   CSN: [id number] Arrival date & time: 12/26/19  1425     History Chief Complaint  Patient presents with  . Shortness of Breath    Zachary Willis is a 74 y.o. male.  Patient is a 74 year old male with past medical history of coronary artery disease with CABG, chronic A. 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  Patient presents today for evaluation of vomiting and diarrhea.  Patient has had this for the past several days.  He was seen by his oncologist last week and found to have an INR of 14.  This was corrected with Vitamin K in the office.  Patient started with diarrhea and vomiting shortly after this.  Had 2 episodes of each today.  He denies seeing any blood in his stool.  He denies any fevers or chills.  He does report some abdominal cramping that is generalized, but most notable in the epigastric region.  Patient has had multiple other abdominal surgeries, but denies history of bowel obstruction.  Patient states today he began to feel short of breath and weak.  He called his oncologist who then told him to come here.  Patient follows with Dr. Marin Olp.  The history is provided by the patient.  Shortness of Breath Severity:  Moderate Timing:  Constant Progression:  Unchanged Chronicity:  New Relieved by:  Nothing Worsened by:  Nothing Ineffective treatments:  None tried Associated symptoms: diaphoresis and vomiting        Past Medical History:  Diagnosis Date  . Anticoagulated on Coumadin    chronic long term  . Antineoplastic chemotherapy induced anemia   . Chronic atrial fibrillation [facility])    cardiologist--  dr Curly Rim [facility] in [place])--- hx DVVC in 2006/  TEE 10-21-2015 (care everywhere)  ef 55-60%, moderate dilated RA, mild MR, RVSP 80mmHg  . CKD (chronic kidney disease), stage II   . Colon cancer metastasized to liver [facility])    current oncologist-- dr Marin Olp (previous oncologist at [facility]  oncology)--- dx 12/ 2017,  Stage IIb ([id number]),  s/p  resection colon tumor 01-01-2016 ,  recurrent w/ liver mets via bx 08/ 2018  , had chemo then partial hepatectomy and completed chemo after surgery/ liver mets recurrence 08/ 2019  . Cushing's disease [facility])    endocriniologist-- dr Elisabeth Most [facility])  . Droopy eyelid, right    11-15-2017  . GERD (gastroesophageal reflux disease)   . Goals of care, counseling/discussion 08/30/2017  . Gout    11-15-2017 per pt last flare-up 2018  . History of benign pituitary tumor    resection 07-25-2013  . History of cancer chemotherapy    COMPLETED 02-20-2017  FOR  COLON CANCER WITH LIVER METS  . History of radiation therapy    03/ 2016  remainder of pituitary tumor  . Hypertension   . Hypertensive cardiovascular disease   . Hypothyroidism, secondary   . OA (osteoarthritis)   . Renal insufficiency   . Secondary adrenal insufficiency ([place])   . Secondary male hypogonadism   . Wears glasses   . Wears partial dentures    upper and lower    Patient Active Problem List   Diagnosis Date Noted  . AKI (acute kidney injury) ([place]) 09/12/2019  . Hypovolemic shock ([place]) 09/12/2019  . Severe sepsis ([place]) 09/12/2019  . Atrial fibrillation ([place]) 09/12/2019  . C. difficile diarrhea 09/12/2019  . Adrenal insufficiency ([place]) 09/12/2019  . Shock ([place]) 09/11/2019  . Colon cancer  metastasized to liver ([place]) 08/30/2017  . Goals of care, counseling/discussion 08/30/2017  . NEPHROLITHIASIS 12/28/2009  . ABDOMINAL PAIN, RIGHT LOWER QUADRANT 12/28/2009    Past Surgical History:  Procedure Laterality Date  . COLOSTOMY TAKEDOWN  05-16-2016   @[facility]   . IR 3D INDEPENDENT WKST  07/17/2018  . IR ANGIOGRAM SELECTIVE EACH ADDITIONAL VESSEL  07/17/2018  . IR ANGIOGRAM SELECTIVE EACH ADDITIONAL VESSEL  07/17/2018  . IR ANGIOGRAM SELECTIVE EACH ADDITIONAL VESSEL  07/17/2018  . IR ANGIOGRAM SELECTIVE EACH ADDITIONAL VESSEL  07/17/2018  . IR ANGIOGRAM SELECTIVE EACH  ADDITIONAL VESSEL  07/17/2018  . IR ANGIOGRAM SELECTIVE EACH ADDITIONAL VESSEL  07/30/2018  . IR ANGIOGRAM SELECTIVE EACH ADDITIONAL VESSEL  07/30/2018  . IR ANGIOGRAM SELECTIVE EACH ADDITIONAL VESSEL  07/30/2018  . IR ANGIOGRAM SELECTIVE EACH ADDITIONAL VESSEL  07/30/2018  . IR ANGIOGRAM SELECTIVE EACH ADDITIONAL VESSEL  07/30/2018  . IR ANGIOGRAM SELECTIVE EACH ADDITIONAL VESSEL  08/27/2018  . IR ANGIOGRAM SELECTIVE EACH ADDITIONAL VESSEL  08/27/2018  . IR ANGIOGRAM VISCERAL SELECTIVE  07/17/2018  . IR ANGIOGRAM VISCERAL SELECTIVE  07/17/2018  . IR ANGIOGRAM VISCERAL SELECTIVE  08/27/2018  . IR EMBO ARTERIAL NOT HEMORR HEMANG INC GUIDE ROADMAPPING  07/17/2018  . IR EMBO TUMOR ORGAN ISCHEMIA INFARCT INC GUIDE ROADMAPPING  07/30/2018  . IR EMBO TUMOR ORGAN ISCHEMIA INFARCT INC GUIDE ROADMAPPING  08/27/2018  . IR RADIOLOGIST EVAL & MGMT  10/10/2017  . IR RADIOLOGIST EVAL & MGMT  11/07/2017  . IR RADIOLOGIST EVAL & MGMT  12/21/2017  . IR RADIOLOGIST EVAL & MGMT  06/19/2018  . IR RADIOLOGIST EVAL & MGMT  09/19/2018  . IR RADIOLOGIST EVAL & MGMT  12/18/2018  . IR US GUIDE VASC ACCESS RIGHT  07/17/2018  . IR US GUIDE VASC ACCESS RIGHT  07/30/2018  . IR US GUIDE VASC ACCESS RIGHT  08/27/2018  . OPEN PARTIAL HEPATECTOMY   01-13-2017    @[facility]    AND CHOLECYSTECTOMY  . RADIOFREQUENCY ABLATION N/A 11/22/2017   Procedure: CT MICROWAVE ABLATION LIVER;  Surgeon: Sandi Mariscal, MD;  Location: [facility] ORS;  Service: Anesthesiology;  Laterality: N/A;  . REVISION TOTAL KNEE ARTHROPLASTY Left 12/20/2013  . SUBTOTAL COLECTOMY  01-01-2016  @[facility]    W/  CREATION ILEOSTOMY AND UMBILICAL HERNIA REPAIR  . TOTAL KNEE ARTHROPLASTY Bilateral left 11-16-2010;  right 10-26-2011  . TRANSPHENOIDAL PITUITARY RESECTION  07-25-2013   @[facility]       Family History  Problem Relation Age of Onset  . Hypertension Mother   . Cancer Mother   . Cancer Father     Social History   Tobacco Use  . Smoking status: Never Smoker  . Smokeless  tobacco: Never Used  Vaping Use  . Vaping Use: Never used  Substance Use Topics  . Alcohol use: Not Currently  . Drug use: Never    Home Medications Prior to Admission medications   Medication Sig Start Date End Date Taking? Authorizing Provider  atorvastatin (LIPITOR) 40 MG tablet Take 40 mg by mouth daily. 03/22/18   [provider]  capecitabine (XELODA) 500 MG tablet Take 5 tablets (2,500 mg total) by mouth 2 (two) times daily after a meal. Take for 14 days, then hold for 7 days. Repeat every 21 days. 10/31/19   [name], MD  carvedilol (COREG) 6.25 MG tablet Take 6.25 mg by mouth 2 (two) times daily with a meal.  05/31/18   [provider]  [facility] ASPIRIN ADULT LOW DOSE 81 MG chewable tablet Chew  81 mg by mouth daily. Patient not taking: Reported on 12/23/2019 02/07/18   [provider]  ferrous sulfate 325 (65 FE) MG tablet Take 325 mg by mouth daily.    [provider]  furosemide (LASIX) 40 MG tablet Take 40 mg by mouth daily. 07/30/19   [provider]  hydrocortisone (CORTEF) 10 MG tablet Take 1-2 tablets (10-20 mg total) by mouth See admin instructions. 20mg  in AM 10mg  in PM 09/20/19   Patrecia Pour, MD  ibuprofen (ADVIL) 200 MG tablet Take 400 mg by mouth every 6 (six) hours as needed for headache or mild pain.    [provider]  levothyroxine (SYNTHROID, LEVOTHROID) 88 MCG tablet Take 88 mcg by mouth daily before breakfast.  05/15/17   [provider]  NASAL [place] NA Place 1 spray into the nose daily.     [provider]  pantoprazole (PROTONIX) 40 MG tablet Take 40 mg by mouth every morning.  05/16/14   [provider]  polyvinyl alcohol (LIQUIFILM TEARS) 1.4 % ophthalmic solution Place 1 drop into both eyes daily as needed for dry eyes.     [provider]  potassium chloride (KLOR-CON) 10 MEQ tablet Take 20 mEq by mouth daily. 11/17/14   [provider]  telmisartan (MICARDIS) 40  MG tablet Take 1 tablet (40 mg total) by mouth daily. 11/26/19   [name], MD  testosterone (ANDRODERM) 4 MG/24HR PT24 patch Place 1 patch onto the skin daily.  04/17/15   [provider]  warfarin (COUMADIN) 5 MG tablet Take 2.5-5 mg by mouth See admin instructions. 5mg  alternating with 2.5mg  every other day Patient not taking: Reported on 12/23/2019 07/05/17   [provider]  prochlorperazine (COMPAZINE) 10 MG tablet Take 1 tablet (10 mg total) by mouth every 6 (six) hours as needed (NAUSEA). Patient not taking: Reported on 10/29/2019 12/18/18 10/29/19  [name], MD    Allergies    Patient has no known allergies.  Review of Systems   Review of Systems  Constitutional: Positive for diaphoresis.  Respiratory: Positive for shortness of breath.   Gastrointestinal: Positive for vomiting.  All other systems reviewed and are negative.   Physical Exam Updated Vital Signs BP (!) 128/57   Pulse 67   Temp 99 F (37.2 C) (Oral)   Resp 20   Ht 5\' 10"  (1.778 m)   Wt 109.7 kg   SpO2 96%   BMI 34.69 kg/m   Physical Exam Vitals and nursing note reviewed.  Constitutional:      General: He is not in acute distress.    Appearance: He is well-developed and well-nourished. He is not diaphoretic.  HENT:     Head: Normocephalic and atraumatic.     Mouth/Throat:     Mouth: Oropharynx is clear and moist.  Cardiovascular:     Rate and Rhythm: Normal rate and regular rhythm.     Heart sounds: No murmur heard. No friction rub.  Pulmonary:     Effort: Pulmonary effort is normal. No respiratory distress.     Breath sounds: Normal breath sounds. No wheezing or rales.  Abdominal:     General: Bowel sounds are normal. There is no distension.     Palpations: Abdomen is soft.     Tenderness: There is abdominal tenderness. There is no guarding or rebound.     Comments: There is mild generalized tenderness, but is most tender in the epigastric region.   Musculoskeletal:  General: No edema. Normal range of motion.     Cervical back: Normal range of motion and neck supple.     Right lower leg: No tenderness. No edema.     Left lower leg: No tenderness. No edema.  Skin:    General: Skin is warm and dry.  Neurological:     Mental Status: He is alert and oriented to person, place, and time.     Coordination: Coordination normal.     ED Results / Procedures / Treatments   Labs (all labs ordered are listed, but only abnormal results are displayed) Labs Reviewed  COMPREHENSIVE METABOLIC PANEL - Abnormal; Notable for the following components:      Result Value   Glucose, Bld 238 (*)    Calcium 8.5 (*)    Total Protein 5.6 (*)    Albumin 3.0 (*)    Total Bilirubin 3.3 (*)    All other components within normal limits  CBC - Abnormal; Notable for the following components:   RDW 18.1 (*)    nRBC 1.0 (*)    All other components within normal limits  LIPASE, BLOOD  URINALYSIS, ROUTINE W REFLEX MICROSCOPIC  BRAIN NATRIURETIC PEPTIDE  PROTIME-INR  TROPONIN I (HIGH SENSITIVITY)    EKG EKG Interpretation  Date/Time:  Thursday December 26 2019 17:33:32 EST Ventricular Rate:  60 PR Interval:    QRS Duration: 103 QT Interval:  428 QTC Calculation: 428 R Axis:   22 Text Interpretation: Atrial fibrillation Low voltage, precordial leads Abnormal R-wave progression, early transition Borderline repolarization abnormality Confirmed by Veryl Speak [phone]) on 12/26/2019 5:35:22 PM   Radiology No results found.  Procedures Procedures (including critical care time)  Medications Ordered in ED Medications  sodium chloride 0.9 % bolus 1,000 mL (has no administration in time range)    ED Course  I have reviewed the triage vital signs and the nursing notes.  Pertinent labs & imaging results that were available during my care of the patient were reviewed by me and considered in my medical decision making (see chart for  details).    MDM Rules/Calculators/A&P  Patient presenting here with complaints of vomiting and diarrhea for the past few days.  He has history of colon cancer.  Patient stools have been liquidy and nonbloody.  His abdominal examination is benign and CT scan shows evidence for enteritis, but no acute surgical process.  Laboratory studies are reassuring.  Patient was given normal saline and seems to be feeling better.  At this point, I feel is no discharge with antibiotics is appropriate.  Patient will be given Cipro and Flagyl and is to return as needed if symptoms worsen or change.  Final Clinical Impression(s) / ED Diagnoses Final diagnoses:  None    Rx / DC Orders ED Discharge Orders    None       Veryl Speak, MD 12/26/19 2107

## 2019-12-31 ENCOUNTER — Inpatient Hospital Stay (HOSPITAL_BASED_OUTPATIENT_CLINIC_OR_DEPARTMENT_OTHER): Payer: Medicare HMO | Admitting: Hematology & Oncology

## 2019-12-31 ENCOUNTER — Encounter: Payer: Self-pay | Admitting: Hematology & Oncology

## 2019-12-31 ENCOUNTER — Telehealth: Payer: Self-pay | Admitting: *Deleted

## 2019-12-31 ENCOUNTER — Inpatient Hospital Stay: Payer: Medicare HMO

## 2019-12-31 ENCOUNTER — Other Ambulatory Visit: Payer: Self-pay

## 2019-12-31 VITALS — BP 96/71 | HR 82 | Temp 97.8°F | Resp 18 | Wt 238.0 lb

## 2019-12-31 DIAGNOSIS — C189 Malignant neoplasm of colon, unspecified: Secondary | ICD-10-CM

## 2019-12-31 DIAGNOSIS — C787 Secondary malignant neoplasm of liver and intrahepatic bile duct: Secondary | ICD-10-CM

## 2019-12-31 DIAGNOSIS — I480 Paroxysmal atrial fibrillation: Secondary | ICD-10-CM

## 2019-12-31 DIAGNOSIS — R791 Abnormal coagulation profile: Secondary | ICD-10-CM

## 2019-12-31 DIAGNOSIS — Z5112 Encounter for antineoplastic immunotherapy: Secondary | ICD-10-CM | POA: Diagnosis not present

## 2019-12-31 LAB — CBC WITH DIFFERENTIAL (CANCER CENTER ONLY)
Abs Immature Granulocytes: 0.17 10*3/uL — ABNORMAL HIGH (ref 0.00–0.07)
Basophils Absolute: 0.1 10*3/uL (ref 0.0–0.1)
Basophils Relative: 1 %
Eosinophils Absolute: 0.1 10*3/uL (ref 0.0–0.5)
Eosinophils Relative: 1 %
HCT: 40.3 % (ref 39.0–52.0)
Hemoglobin: 13.3 g/dL (ref 13.0–17.0)
Immature Granulocytes: 2 %
Lymphocytes Relative: 36 %
Lymphs Abs: 3.3 10*3/uL (ref 0.7–4.0)
MCH: 30.6 pg (ref 26.0–34.0)
MCHC: 33 g/dL (ref 30.0–36.0)
MCV: 92.6 fL (ref 80.0–100.0)
Monocytes Absolute: 1.8 10*3/uL — ABNORMAL HIGH (ref 0.1–1.0)
Monocytes Relative: 19 %
Neutro Abs: 3.7 10*3/uL (ref 1.7–7.7)
Neutrophils Relative %: 41 %
Platelet Count: 150 10*3/uL (ref 150–400)
RBC: 4.35 MIL/uL (ref 4.22–5.81)
RDW: 18.8 % — ABNORMAL HIGH (ref 11.5–15.5)
WBC Count: 9.2 10*3/uL (ref 4.0–10.5)
nRBC: 2.9 % — ABNORMAL HIGH (ref 0.0–0.2)

## 2019-12-31 LAB — CMP (CANCER CENTER ONLY)
ALT: 26 U/L (ref 0–44)
AST: 37 U/L (ref 15–41)
Albumin: 3.2 g/dL — ABNORMAL LOW (ref 3.5–5.0)
Alkaline Phosphatase: 128 U/L — ABNORMAL HIGH (ref 38–126)
Anion gap: 11 (ref 5–15)
BUN: 14 mg/dL (ref 8–23)
CO2: 25 mmol/L (ref 22–32)
Calcium: 9.1 mg/dL (ref 8.9–10.3)
Chloride: 108 mmol/L (ref 98–111)
Creatinine: 1.51 mg/dL — ABNORMAL HIGH (ref 0.61–1.24)
GFR, Estimated: 49 mL/min — ABNORMAL LOW (ref >60)
Glucose, Bld: 136 mg/dL — ABNORMAL HIGH (ref 70–99)
Potassium: 3.7 mmol/L (ref 3.5–5.1)
Sodium: 144 mmol/L (ref 135–145)
Total Bilirubin: 1.6 mg/dL — ABNORMAL HIGH (ref 0.3–1.2)
Total Protein: 5.4 g/dL — ABNORMAL LOW (ref 6.5–8.1)

## 2019-12-31 LAB — SAMPLE TO BLOOD BANK

## 2019-12-31 LAB — LACTATE DEHYDROGENASE: LDH: 538 U/L — ABNORMAL HIGH (ref 98–192)

## 2019-12-31 LAB — PROTIME-INR
INR: 6.9 (ref 0.8–1.2)
Prothrombin Time: 58.1 seconds — ABNORMAL HIGH (ref 11.4–15.2)

## 2019-12-31 LAB — CEA (IN HOUSE-CHCC): CEA (CHCC-In House): 244.74 ng/mL — ABNORMAL HIGH (ref 0.00–5.00)

## 2019-12-31 NOTE — Telephone Encounter (Signed)
Dr. Myna Hidalgo notified of PT-58.1 and INR-6.9.  Dr. Myna Hidalgo instructed pt to hold Coumadin until Thursday, 01/02/20 when he comes back to have PT/INR checked again.

## 2019-12-31 NOTE — Progress Notes (Signed)
Hematology and Oncology Follow Up Visit  Zachary Willis [id number] February 22, 1945 74 y.o. 12/31/2019   Principle Diagnosis:  Metastatic colon cancer - progressive  Past Therapy: Status post RFA to the liver-11/21/2017  S/P Y-90 x 2 -- last therapy on 08/27/2018  Current Therapy: FOLFIRI -- started on 01/09/2019, s/p cycle12 -- d/c on 10/29/2019 Xeloda/Avastin -- cycle #1 to start on 11/23/2019   Interim History:  Mr. Skold is here today for follow-up.  Thankfully, his wife came with him.  Is been a lot houses are seen his wife.  He has had problems with gastroenteritis.  We actually saw him a week ago.  We gave him IV fluids.  He unfortunately did not get better.  We had to get him to the emergency room.  There, he was found to have a gastroenteritis.  He was given a CT scan.  This shows thickening of the jejunum.  He was placed on ciprofloxacin and Flagyl.  He says that the diarrhea is getting better.  We have held his Xeloda for quite a while.  I does think that the Xeloda will certainly worsen the diarrhea so he is having the Xeloda on hold.  He is post to have the Avastin today.  Again, I am holding the Avastin until he gets over this gastroenteritis.  His INR is 6.9.  He will hold his Coumadin until he has his INR checked on Thursday.    He is still not eating all that much.  He basically is bases having some liquids and some soft food.  I think his blood pressure is doing better which is nice to see.  His CEA level came back today at 244 which actually is better than previously noted.  I would have to say that his performance has right now is probably ECOG 2.  Medications:  Allergies as of 12/31/2019   No Known Allergies     Medication List       Accurate as of December 31, 2019  4:23 PM. If you have any questions, ask your nurse or doctor.        atorvastatin 40 MG tablet Commonly known as: LIPITOR Take 40 mg by mouth daily.   capecitabine 500 MG  tablet Commonly known as: XELODA Take 5 tablets (2,500 mg total) by mouth 2 (two) times daily after a meal. Take for 14 days, then hold for 7 days. Repeat every 21 days.   carvedilol 6.25 MG tablet Commonly known as: COREG Take 6.25 mg by mouth 2 (two) times daily with a meal.   ciprofloxacin 500 MG tablet Commonly known as: Cipro Take 1 tablet (500 mg total) by mouth 2 (two) times daily. One po bid x 7 days   [facility] Aspirin Adult Low Dose 81 MG chewable tablet Generic drug: aspirin Chew 81 mg by mouth daily.   ferrous sulfate 325 (65 FE) MG tablet Take 325 mg by mouth daily.   furosemide 40 MG tablet Commonly known as: LASIX Take 40 mg by mouth daily.   hydrocortisone 10 MG tablet Commonly known as: CORTEF Take 1-2 tablets (10-20 mg total) by mouth See admin instructions. 20mg  in AM 10mg  in PM   ibuprofen 200 MG tablet Commonly known as: ADVIL Take 400 mg by mouth every 6 (six) hours as needed for headache or mild pain.   levothyroxine 88 MCG tablet Commonly known as: SYNTHROID Take 88 mcg by mouth daily before breakfast.   metroNIDAZOLE 500 MG tablet Commonly known as: Flagyl Take 1  tablet (500 mg total) by mouth 3 (three) times daily. One po tid x 7 days   NASAL WASH NA Place 1 spray into the nose daily.   ondansetron 8 MG tablet Commonly known as: Zofran Take 1 tablet (8 mg total) by mouth every 4 (four) hours as needed for nausea.   pantoprazole 40 MG tablet Commonly known as: PROTONIX Take 40 mg by mouth every morning.   polyvinyl alcohol 1.4 % ophthalmic solution Commonly known as: LIQUIFILM TEARS Place 1 drop into both eyes daily as needed for dry eyes.   potassium chloride 10 MEQ tablet Commonly known as: KLOR-CON Take 20 mEq by mouth daily.   telmisartan 40 MG tablet Commonly known as: Micardis Take 1 tablet (40 mg total) by mouth daily.   testosterone 4 MG/24HR Pt24 patch Commonly known as: ANDRODERM Place 1 patch onto the skin daily.    warfarin 5 MG tablet Commonly known as: COUMADIN Take 2.5-5 mg by mouth See admin instructions. 5mg  alternating with 2.5mg  every other day       Allergies: No Known Allergies  Past Medical History, Surgical history, Social history, and Family History were reviewed and updated.  Review of Systems: Review of Systems  Constitutional: Negative.   HENT: Negative.   Eyes: Negative.   Respiratory: Negative.   Cardiovascular: Negative.   Gastrointestinal: Negative.   Genitourinary: Negative.   Musculoskeletal: Negative.   Skin: Negative.   Neurological: Negative.   Endo/Heme/Allergies: Negative.   Psychiatric/Behavioral: Negative.       Physical Exam:  weight is 238 lb (108 kg). His oral temperature is 97.8 F (36.6 C). His blood pressure is 96/71 and his pulse is 82. His respiration is 18 and oxygen saturation is 100%.   Wt Readings from Last 3 Encounters:  12/31/19 238 lb (108 kg)  12/26/19 241 lb 12.8 oz (109.7 kg)  12/23/19 241 lb 12.8 oz (109.7 kg)    Physical Exam Vitals reviewed.  HENT:     Head: Normocephalic and atraumatic.  Eyes:     Pupils: Pupils are equal, round, and reactive to light.  Cardiovascular:     Rate and Rhythm: Normal rate and regular rhythm.     Heart sounds: Normal heart sounds.     Comments: Cardiac exam shows a regular rate and rhythm.  There is some occasional extra beat.  I do not hear any obvious atrial fibrillation. Pulmonary:     Effort: Pulmonary effort is normal.     Breath sounds: Normal breath sounds.  Abdominal:     General: Bowel sounds are normal.     Palpations: Abdomen is soft.     Comments: Abdominal exam shows laparotomy scars.  He has no fluid wave.  He has no guarding or rebound tenderness.  There is no palpable liver or spleen tip.  Musculoskeletal:        General: No tenderness or deformity. Normal range of motion.     Cervical back: Normal range of motion.  Lymphadenopathy:     Cervical: No cervical adenopathy.   Skin:    General: Skin is warm and dry.     Findings: No erythema or rash.  Neurological:     Mental Status: He is alert and oriented to person, place, and time.  Psychiatric:        Behavior: Behavior normal.        Thought Content: Thought content normal.        Judgment: Judgment normal.    Lab Results  Component  Value Date   WBC 9.2 12/31/2019   HGB 13.3 12/31/2019   HCT 40.3 12/31/2019   MCV 92.6 12/31/2019   PLT 150 12/31/2019   Lab Results  Component Value Date   FERRITIN 173 09/19/2019   IRON 39 (L) 09/19/2019   TIBC 240 (L) 09/19/2019   UIBC 201 09/19/2019   IRONPCTSAT 16 (L) 09/19/2019   Lab Results  Component Value Date   RBC 4.35 12/31/2019   No results found for: KPAFRELGTCHN, LAMBDASER, KAPLAMBRATIO No results found for: IGGSERUM, IGA, IGMSERUM No results found for: Odetta Pink, SPEI   Chemistry      Component Value Date/Time   NA 144 12/31/2019 0858   K 3.7 12/31/2019 0858   CL 108 12/31/2019 0858   CO2 25 12/31/2019 0858   BUN 14 12/31/2019 0858   CREATININE 1.51 (H) 12/31/2019 0858      Component Value Date/Time   CALCIUM 9.1 12/31/2019 0858   ALKPHOS 128 (H) 12/31/2019 0858   AST 37 12/31/2019 0858   ALT 26 12/31/2019 0858   BILITOT 1.6 (H) 12/31/2019 0858      Impression and Plan: Mr. Gerring is a very pleasant 74 yo caucasian gentleman with metastatic colon cancer.  I really hate that he has had this issue with the abdominal pain and diarrhea.  Again, has had this gastroenteritis.  We will have to see what his INR is in 2 days.  Again is tough to manage this given the issues with respect to the Xeloda and the diarrhea and gastroenteritis.  I will get him back to see [place] in another 2 or 3 weeks.  Hopefully, by then, we will be able to get him back on treatment.  I realize that his cancer is thankfully, holding steady right now.  The CEA is a little reassuring.       [name], MD 12/28/20214:23 PM

## 2020-01-02 ENCOUNTER — Inpatient Hospital Stay: Payer: Medicare HMO

## 2020-01-02 ENCOUNTER — Other Ambulatory Visit: Payer: Self-pay | Admitting: Hematology & Oncology

## 2020-01-02 ENCOUNTER — Other Ambulatory Visit: Payer: Self-pay | Admitting: *Deleted

## 2020-01-02 ENCOUNTER — Other Ambulatory Visit: Payer: Self-pay

## 2020-01-02 ENCOUNTER — Telehealth: Payer: Self-pay | Admitting: *Deleted

## 2020-01-02 VITALS — BP 102/65 | HR 62 | Temp 98.2°F | Resp 17

## 2020-01-02 DIAGNOSIS — C189 Malignant neoplasm of colon, unspecified: Secondary | ICD-10-CM

## 2020-01-02 DIAGNOSIS — R791 Abnormal coagulation profile: Secondary | ICD-10-CM

## 2020-01-02 DIAGNOSIS — I480 Paroxysmal atrial fibrillation: Secondary | ICD-10-CM

## 2020-01-02 DIAGNOSIS — Z5112 Encounter for antineoplastic immunotherapy: Secondary | ICD-10-CM | POA: Diagnosis not present

## 2020-01-02 DIAGNOSIS — C787 Secondary malignant neoplasm of liver and intrahepatic bile duct: Secondary | ICD-10-CM

## 2020-01-02 LAB — PROTIME-INR
INR: >10 (ref 0.8–1.2)
Prothrombin Time: >90 seconds — ABNORMAL HIGH (ref 11.4–15.2)

## 2020-01-02 MED ORDER — SODIUM CHLORIDE 0.9 % IV SOLN
INTRAVENOUS | Status: DC
  Filled 2020-01-02 (×2): qty 250

## 2020-01-02 MED ORDER — PHYTONADIONE 10 MG/ML INJECTION
5.0000 mg | Freq: Once | INTRAMUSCULAR | Status: DC
  Filled 2020-01-02: qty 1

## 2020-01-02 MED ORDER — PHYTONADIONE 10 MG/ML INJECTION
5.0000 mg | Freq: Once | INTRAMUSCULAR | Status: AC
  Administered 2020-01-02: 5 mg via SUBCUTANEOUS
  Filled 2020-01-02: qty 1

## 2020-01-02 NOTE — Patient Instructions (Signed)
Phytonadione, Vitamin K1 injection What is this medicine? PHYTONADIONE (fye toe na DYE one) is a man-made form of vitamin K. This medicine is used to treat vitamin K deficiency or bleeding problems caused by various disorders. This medicine is also given to newborn babies to prevent bleeding. This medicine may be used for other purposes; ask your health care provider or pharmacist if you have questions. COMMON BRAND NAME(S): AquaMEPHYTON What should I tell my health care provider before I take this medicine? They need to know if you have any of these conditions:  liver disease  an unusual or allergic reaction to phytonadione, other medicine, foods, dyes, or preservatives  pregnant or trying to get pregnant  breast-feeding How should I use this medicine? This medicine is for injection under the skin, into a muscle, or rarely, into a vein. It is usually given by a health care professional in a hospital or clinic setting. In newborn babies, this medicine is injected into the muscle as a one-time dose shortly after they are born. If you get this medicine at home, you will be taught how to prepare and give this medicine. Use exactly as directed. Take your medicine at regular intervals. Do not take your medicine more often than directed. It is important that you put your used needles and syringes in a special sharps container. Do not put them in a trash can. If you do not have a sharps container, call your pharmacist or healthcare provider to get one. Talk to your pediatrician regarding the use of this medicine in children. While this drug may be prescribed for children as young as newborns for selected conditions, precautions do apply. Overdosage: If you think you have taken too much of this medicine contact a poison control center or emergency room at once. NOTE: This medicine is only for you. Do not share this medicine with others. What if I miss a dose? If you miss a dose, take it as soon as you  can. If it is almost time for your next dose, take only that dose. Do not take double or extra doses. What may interact with this medicine?  medicines that treat or prevent blood clots like warfarin This list may not describe all possible interactions. Give your health care provider a list of all the medicines, herbs, non-prescription drugs, or dietary supplements you use. Also tell them if you smoke, drink alcohol, or use illegal drugs. Some items may interact with your medicine. What should I watch for while using this medicine? Visit your doctor or health care professional for regular checks on your progress. Your doctor or health care professional will schedule tests to make sure the medicine is working properly. What side effects may I notice from receiving this medicine? Side effects that you should report to your doctor or health care professional as soon as possible:  allergic reactions like skin rash, itching or hives, swelling of the face, lips, or tongue  bluish discoloration of lips, fingernails, or palms of hands  breathing problems  increased sweating  yellowing of the eyes or skin when this medicine is given to newborn babies Side effects that usually do not require medical attention (report to your doctor or health care professional if they continue or are bothersome):  changes in taste  dizziness  flushing of the face  pain, inflammation, or swelling at site where injected This list may not describe all possible side effects. Call your doctor for medical advice about side effects. You may report side  effects to FDA at 1-800-FDA-1088. Where should I keep my medicine? Keep out of the reach of children. Store at room temperature between 15 and 30 degrees C (59 and 86 degrees F). Do not freeze. Protect from light. Store in tightly closed container and original carton until contents have been used. Throw away any unused medicine after the expiration date. NOTE: This sheet  is a summary. It may not cover all possible information. If you have questions about this medicine, talk to your doctor, pharmacist, or health care provider.  2020 Elsevier/Gold Standard (2008-03-31 15:26:08)

## 2020-01-02 NOTE — Telephone Encounter (Signed)
Dr. Myna Hidalgo notified of PT greater than 90 and INR greater than 10. Order received for pt to get IV Vitamin K today.  Pt called at home and will return to office now.

## 2020-01-06 ENCOUNTER — Telehealth: Payer: Self-pay | Admitting: *Deleted

## 2020-01-06 ENCOUNTER — Other Ambulatory Visit: Payer: Self-pay

## 2020-01-06 ENCOUNTER — Inpatient Hospital Stay: Payer: Medicare HMO | Attending: Hematology & Oncology

## 2020-01-06 DIAGNOSIS — Z79899 Other long term (current) drug therapy: Secondary | ICD-10-CM | POA: Insufficient documentation

## 2020-01-06 DIAGNOSIS — C799 Secondary malignant neoplasm of unspecified site: Secondary | ICD-10-CM | POA: Insufficient documentation

## 2020-01-06 DIAGNOSIS — D696 Thrombocytopenia, unspecified: Secondary | ICD-10-CM | POA: Insufficient documentation

## 2020-01-06 DIAGNOSIS — R791 Abnormal coagulation profile: Secondary | ICD-10-CM

## 2020-01-06 DIAGNOSIS — I480 Paroxysmal atrial fibrillation: Secondary | ICD-10-CM

## 2020-01-06 DIAGNOSIS — C189 Malignant neoplasm of colon, unspecified: Secondary | ICD-10-CM | POA: Insufficient documentation

## 2020-01-06 LAB — PROTIME-INR
INR: 2.3 — ABNORMAL HIGH (ref 0.8–1.2)
Prothrombin Time: 24.6 seconds — ABNORMAL HIGH (ref 11.4–15.2)

## 2020-01-06 NOTE — Telephone Encounter (Signed)
INR 2.3 .  Dr Myna Hidalgo aware.  Patient to be on Coumadin 2.5 mg daily per Dr Myna Hidalgo.  Recheck INR on Thursday.  appt given to patient

## 2020-01-09 ENCOUNTER — Other Ambulatory Visit: Payer: Self-pay

## 2020-01-09 ENCOUNTER — Other Ambulatory Visit: Payer: Self-pay | Admitting: *Deleted

## 2020-01-09 ENCOUNTER — Telehealth: Payer: Self-pay | Admitting: *Deleted

## 2020-01-09 ENCOUNTER — Inpatient Hospital Stay: Payer: Medicare HMO

## 2020-01-09 ENCOUNTER — Other Ambulatory Visit: Payer: Medicare HMO

## 2020-01-09 DIAGNOSIS — I480 Paroxysmal atrial fibrillation: Secondary | ICD-10-CM

## 2020-01-09 DIAGNOSIS — C189 Malignant neoplasm of colon, unspecified: Secondary | ICD-10-CM | POA: Diagnosis not present

## 2020-01-09 LAB — PROTIME-INR
INR: 4.3 (ref 0.8–1.2)
Prothrombin Time: 40 seconds — ABNORMAL HIGH (ref 11.4–15.2)

## 2020-01-09 MED ORDER — WARFARIN SODIUM 2 MG PO TABS: 2.0000 mg | ORAL_TABLET | Freq: Every day | ORAL | 2 refills | Status: DC

## 2020-01-09 NOTE — Telephone Encounter (Addendum)
Dr. Myna Hidalgo notified of PT-40.0 and INR-4.3.  Orders received for pt to hold Coumadin today and tomorrow and to restart Coumadin at 2 mg daily on Saturday, 1//8/22.  Pt and pt.'s wife currently in lobby and notified of MD orders. Teach back done.  Pt and pt.'s wife have no questions at this time.

## 2020-01-14 ENCOUNTER — Other Ambulatory Visit: Payer: Self-pay

## 2020-01-14 ENCOUNTER — Telehealth: Payer: Self-pay | Admitting: *Deleted

## 2020-01-14 ENCOUNTER — Inpatient Hospital Stay: Payer: Medicare HMO

## 2020-01-14 DIAGNOSIS — C189 Malignant neoplasm of colon, unspecified: Secondary | ICD-10-CM | POA: Diagnosis not present

## 2020-01-14 DIAGNOSIS — I480 Paroxysmal atrial fibrillation: Secondary | ICD-10-CM

## 2020-01-14 LAB — PROTIME-INR
INR: 5.5 (ref 0.8–1.2)
Prothrombin Time: 48.3 seconds — ABNORMAL HIGH (ref 11.4–15.2)

## 2020-01-14 NOTE — Telephone Encounter (Signed)
Dr. Marin Olp notified of PT-48.3 and INR-5.5.  Order received for pt to continue to hold Coumadin and to return as scheduled on Thursday, 01/16/20.  Pt and pt.'s wife in lobby and notified of MD's orders and have no questions at this time.  Teach back done.

## 2020-01-16 ENCOUNTER — Inpatient Hospital Stay (HOSPITAL_BASED_OUTPATIENT_CLINIC_OR_DEPARTMENT_OTHER): Payer: Medicare HMO | Admitting: Hematology & Oncology

## 2020-01-16 ENCOUNTER — Inpatient Hospital Stay: Payer: Medicare HMO

## 2020-01-16 ENCOUNTER — Other Ambulatory Visit: Payer: Self-pay

## 2020-01-16 ENCOUNTER — Encounter: Payer: Self-pay | Admitting: Hematology & Oncology

## 2020-01-16 VITALS — BP 91/61 | HR 51 | Temp 98.4°F | Resp 19 | Wt 243.0 lb

## 2020-01-16 DIAGNOSIS — I480 Paroxysmal atrial fibrillation: Secondary | ICD-10-CM

## 2020-01-16 DIAGNOSIS — C189 Malignant neoplasm of colon, unspecified: Secondary | ICD-10-CM

## 2020-01-16 DIAGNOSIS — C787 Secondary malignant neoplasm of liver and intrahepatic bile duct: Secondary | ICD-10-CM

## 2020-01-16 LAB — CBC WITH DIFFERENTIAL (CANCER CENTER ONLY)
Abs Immature Granulocytes: 0.1 10*3/uL — ABNORMAL HIGH (ref 0.00–0.07)
Basophils Absolute: 0 10*3/uL (ref 0.0–0.1)
Basophils Relative: 1 %
Eosinophils Absolute: 0.1 10*3/uL (ref 0.0–0.5)
Eosinophils Relative: 2 %
HCT: 38.1 % — ABNORMAL LOW (ref 39.0–52.0)
Hemoglobin: 12 g/dL — ABNORMAL LOW (ref 13.0–17.0)
Immature Granulocytes: 2 %
Lymphocytes Relative: 16 %
Lymphs Abs: 0.8 10*3/uL (ref 0.7–4.0)
MCH: 31.3 pg (ref 26.0–34.0)
MCHC: 31.5 g/dL (ref 30.0–36.0)
MCV: 99.2 fL (ref 80.0–100.0)
Monocytes Absolute: 0.4 10*3/uL (ref 0.1–1.0)
Monocytes Relative: 9 %
Neutro Abs: 3.4 10*3/uL (ref 1.7–7.7)
Neutrophils Relative %: 70 %
Platelet Count: 59 10*3/uL — ABNORMAL LOW (ref 150–400)
RBC: 3.84 MIL/uL — ABNORMAL LOW (ref 4.22–5.81)
RDW: 22.1 % — ABNORMAL HIGH (ref 11.5–15.5)
WBC Count: 4.8 10*3/uL (ref 4.0–10.5)
nRBC: 1 % — ABNORMAL HIGH (ref 0.0–0.2)

## 2020-01-16 LAB — CMP (CANCER CENTER ONLY)
ALT: 67 U/L — ABNORMAL HIGH (ref 0–44)
AST: 61 U/L — ABNORMAL HIGH (ref 15–41)
Albumin: 2.9 g/dL — ABNORMAL LOW (ref 3.5–5.0)
Alkaline Phosphatase: 194 U/L — ABNORMAL HIGH (ref 38–126)
Anion gap: 3 — ABNORMAL LOW (ref 5–15)
BUN: 15 mg/dL (ref 8–23)
CO2: 33 mmol/L — ABNORMAL HIGH (ref 22–32)
Calcium: 9.1 mg/dL (ref 8.9–10.3)
Chloride: 106 mmol/L (ref 98–111)
Creatinine: 0.9 mg/dL (ref 0.61–1.24)
GFR, Estimated: >60 mL/min (ref >60)
Glucose, Bld: 168 mg/dL — ABNORMAL HIGH (ref 70–99)
Potassium: 4.4 mmol/L (ref 3.5–5.1)
Sodium: 142 mmol/L (ref 135–145)
Total Bilirubin: 2 mg/dL — ABNORMAL HIGH (ref 0.3–1.2)
Total Protein: 4.6 g/dL — ABNORMAL LOW (ref 6.5–8.1)

## 2020-01-16 LAB — PROTIME-INR
INR: 4 — ABNORMAL HIGH (ref 0.8–1.2)
Prothrombin Time: 37.5 seconds — ABNORMAL HIGH (ref 11.4–15.2)

## 2020-01-16 NOTE — Progress Notes (Signed)
Hematology and Oncology Follow Up Visit  Zachary Willis [id number] 10-Apr-1945 75 y.o. 01/16/2020   Principle Diagnosis:  Metastatic colon cancer - progressive  Past Therapy: Status post RFA to the liver-11/21/2017  S/P Y-90 x 2 -- last therapy on 08/27/2018  Current Therapy: FOLFIRI -- started on 01/09/2019, s/p cycle12 -- d/c on 10/29/2019 Xeloda/Avastin -- cycle #1 to start on 11/23/2019   Interim History:  Zachary Willis is here today for follow-up.  His wife came with him.  We have been having some issues with his Coumadin.  His INR typically has come back quite high.  We have cut down his Coumadin dose.  Today, the INR is 4.  He is on 2 mg a day.  He has held the Coumadin for couple days.  I told him not to take Coumadin until Saturday.  I am not sure why his platelet count is 59,000.  I really would like to get him on treatment with the Avastin.  I think that the Zachary Willis is working because it CEA level came back at about half the level as it was before.  I will have him stay off the Xeloda until we can get the platelet count higher.  I will have him restart Xeloda at 2000 mg twice daily for 14 days.  He is not having diarrhea.  He is eating better.  There is no nausea or vomiting.  He has had no cardiac issues.  He was having some issues with respect to near syncope.  His cardiologist has been adjusting his medications.  Currently, his performance status is ECOG 2.    Medications:  Allergies as of 01/16/2020   No Known Allergies     Medication List       Accurate as of January 16, 2020 12:13 PM. If you have any questions, ask your nurse or doctor.        atorvastatin 40 MG tablet Commonly known as: LIPITOR Take 40 mg by mouth daily.   capecitabine 500 MG tablet Commonly known as: XELODA Take 5 tablets (2,500 mg total) by mouth 2 (two) times daily after a meal. Take for 14 days, then hold for 7 days. Repeat every 21 days.   carvedilol 6.25 MG  tablet Commonly known as: COREG Take 6.25 mg by mouth 2 (two) times daily with a meal.   ciprofloxacin 500 MG tablet Commonly known as: Cipro Take 1 tablet (500 mg total) by mouth 2 (two) times daily. One po bid x 7 days   [facility] Aspirin Adult Low Dose 81 MG chewable tablet Generic drug: aspirin Chew 81 mg by mouth daily.   ferrous sulfate 325 (65 FE) MG tablet Take 325 mg by mouth daily.   furosemide 40 MG tablet Commonly known as: LASIX Take 40 mg by mouth daily.   hydrocortisone 10 MG tablet Commonly known as: CORTEF Take 1-2 tablets (10-20 mg total) by mouth See admin instructions. 20mg  in AM 10mg  in PM   ibuprofen 200 MG tablet Commonly known as: ADVIL Take 400 mg by mouth every 6 (six) hours as needed for headache or mild pain.   levothyroxine 88 MCG tablet Commonly known as: SYNTHROID Take 88 mcg by mouth daily before breakfast.   metroNIDAZOLE 500 MG tablet Commonly known as: Flagyl Take 1 tablet (500 mg total) by mouth 3 (three) times daily. One po tid x 7 days   NASAL WASH NA Place 1 spray into the nose daily.   ondansetron 8 MG tablet Commonly known as:  Zofran Take 1 tablet (8 mg total) by mouth every 4 (four) hours as needed for nausea.   pantoprazole 40 MG tablet Commonly known as: PROTONIX Take 40 mg by mouth every morning.   polyvinyl alcohol 1.4 % ophthalmic solution Commonly known as: LIQUIFILM TEARS Place 1 drop into both eyes daily as needed for dry eyes.   potassium chloride 10 MEQ tablet Commonly known as: KLOR-CON Take 20 mEq by mouth daily.   telmisartan 40 MG tablet Commonly known as: Micardis Take 1 tablet (40 mg total) by mouth daily.   testosterone 4 MG/24HR Pt24 patch Commonly known as: ANDRODERM Place 1 patch onto the skin daily.   warfarin 2 MG tablet Commonly known as: Coumadin Take 1 tablet (2 mg total) by mouth daily.       Allergies: No Known Allergies  Past Medical History, Surgical history, Social history, and  Family History were reviewed and updated.  Review of Systems: Review of Systems  Constitutional: Negative.   HENT: Negative.   Eyes: Negative.   Respiratory: Negative.   Cardiovascular: Negative.   Gastrointestinal: Negative.   Genitourinary: Negative.   Musculoskeletal: Negative.   Skin: Negative.   Neurological: Negative.   Endo/Heme/Allergies: Negative.   Psychiatric/Behavioral: Negative.       Physical Exam:  weight is 243 lb (110.2 kg). His oral temperature is 98.4 F (36.9 C). His blood pressure is 91/61 and his pulse is 51 (abnormal). His respiration is 19 and oxygen saturation is 100%.   Wt Readings from Last 3 Encounters:  01/16/20 243 lb (110.2 kg)  12/31/19 238 lb (108 kg)  12/26/19 241 lb 12.8 oz (109.7 kg)    Physical Exam Vitals reviewed.  HENT:     Head: Normocephalic and atraumatic.  Eyes:     Pupils: Pupils are equal, round, and reactive to light.  Cardiovascular:     Rate and Rhythm: Normal rate and regular rhythm.     Heart sounds: Normal heart sounds.     Comments: Cardiac exam shows a regular rate and rhythm.  There is some occasional extra beat.  I do not hear any obvious atrial fibrillation. Pulmonary:     Effort: Pulmonary effort is normal.     Breath sounds: Normal breath sounds.  Abdominal:     General: Bowel sounds are normal.     Palpations: Abdomen is soft.     Comments: Abdominal exam shows laparotomy scars.  He has no fluid wave.  He has no guarding or rebound tenderness.  There is no palpable liver or spleen tip.  Musculoskeletal:        General: No tenderness or deformity. Normal range of motion.     Cervical back: Normal range of motion.  Lymphadenopathy:     Cervical: No cervical adenopathy.  Skin:    General: Skin is warm and dry.     Findings: No erythema or rash.  Neurological:     Mental Status: He is alert and oriented to person, place, and time.  Psychiatric:        Behavior: Behavior normal.        Thought Content:  Thought content normal.        Judgment: Judgment normal.    Lab Results  Component Value Date   WBC 4.8 01/16/2020   HGB 12.0 (L) 01/16/2020   HCT 38.1 (L) 01/16/2020   MCV 99.2 01/16/2020   PLT 59 (L) 01/16/2020   Lab Results  Component Value Date   FERRITIN 173 09/19/2019  IRON 39 (L) 09/19/2019   TIBC 240 (L) 09/19/2019   UIBC 201 09/19/2019   IRONPCTSAT 16 (L) 09/19/2019   Lab Results  Component Value Date   RBC 3.84 (L) 01/16/2020   No results found for: KPAFRELGTCHN, LAMBDASER, KAPLAMBRATIO No results found for: IGGSERUM, IGA, IGMSERUM No results found for: Ronnald Ramp, A1GS, A2GS, Violet Baldy, MSPIKE, SPEI   Chemistry      Component Value Date/Time   NA 142 01/16/2020 1043   K 4.4 01/16/2020 1043   CL 106 01/16/2020 1043   CO2 33 (H) 01/16/2020 1043   BUN 15 01/16/2020 1043   CREATININE 0.90 01/16/2020 1043      Component Value Date/Time   CALCIUM 9.1 01/16/2020 1043   ALKPHOS 194 (H) 01/16/2020 1043   AST 61 (H) 01/16/2020 1043   ALT 67 (H) 01/16/2020 1043   BILITOT 2.0 (H) 01/16/2020 1043      Impression and Plan: Mr. Shorten is a very pleasant 75 yo caucasian gentleman with metastatic colon cancer.  I cannot figure out why his platelet count is low.  Again he has not been on treatment for several weeks.  He is not septic.  We will have him come back in about a week.  Hopefully, the platelet count will be better.  He will stay off the Xeloda until we see him back.  He will restart the Coumadin at 2 mg daily in 2 days.  This is quite complicated.  I am glad that his wife came in with him.  She is a really good woman and she really helps him out quite a bit.  [name], MD 1/13/202212:13 PM

## 2020-01-17 ENCOUNTER — Telehealth: Payer: Self-pay

## 2020-01-17 LAB — CEA (IN HOUSE-CHCC): CEA (CHCC-In House): 346.89 ng/mL — ABNORMAL HIGH (ref 0.00–5.00)

## 2020-01-17 LAB — IRON AND TIBC
Iron: 83 ug/dL (ref 42–163)
Saturation Ratios: 30 % (ref 20–55)
TIBC: 274 ug/dL (ref 202–409)
UIBC: 190 ug/dL (ref 117–376)

## 2020-01-17 LAB — FERRITIN: Ferritin: 153 ng/mL (ref 24–336)

## 2020-01-17 NOTE — Telephone Encounter (Signed)
S/w pt and he is aware of his appt per 01/17/20 los   aom

## 2020-01-23 ENCOUNTER — Inpatient Hospital Stay: Payer: Medicare HMO

## 2020-01-23 ENCOUNTER — Other Ambulatory Visit: Payer: Self-pay

## 2020-01-23 ENCOUNTER — Inpatient Hospital Stay (HOSPITAL_BASED_OUTPATIENT_CLINIC_OR_DEPARTMENT_OTHER): Payer: Medicare HMO | Admitting: Hematology & Oncology

## 2020-01-23 VITALS — BP 122/80 | HR 57 | Temp 98.4°F | Resp 18 | Wt 253.0 lb

## 2020-01-23 DIAGNOSIS — C787 Secondary malignant neoplasm of liver and intrahepatic bile duct: Secondary | ICD-10-CM

## 2020-01-23 DIAGNOSIS — C189 Malignant neoplasm of colon, unspecified: Secondary | ICD-10-CM

## 2020-01-23 DIAGNOSIS — I480 Paroxysmal atrial fibrillation: Secondary | ICD-10-CM

## 2020-01-23 LAB — CBC WITH DIFFERENTIAL (CANCER CENTER ONLY)
Abs Immature Granulocytes: 0.1 10*3/uL — ABNORMAL HIGH (ref 0.00–0.07)
Basophils Absolute: 0 10*3/uL (ref 0.0–0.1)
Basophils Relative: 0 %
Eosinophils Absolute: 0.1 10*3/uL (ref 0.0–0.5)
Eosinophils Relative: 1 %
HCT: 36.3 % — ABNORMAL LOW (ref 39.0–52.0)
Hemoglobin: 11.6 g/dL — ABNORMAL LOW (ref 13.0–17.0)
Immature Granulocytes: 2 %
Lymphocytes Relative: 14 %
Lymphs Abs: 0.8 10*3/uL (ref 0.7–4.0)
MCH: 31.5 pg (ref 26.0–34.0)
MCHC: 32 g/dL (ref 30.0–36.0)
MCV: 98.6 fL (ref 80.0–100.0)
Monocytes Absolute: 0.4 10*3/uL (ref 0.1–1.0)
Monocytes Relative: 7 %
Neutro Abs: 4.2 10*3/uL (ref 1.7–7.7)
Neutrophils Relative %: 76 %
Platelet Count: 46 10*3/uL — ABNORMAL LOW (ref 150–400)
RBC: 3.68 MIL/uL — ABNORMAL LOW (ref 4.22–5.81)
RDW: 21.6 % — ABNORMAL HIGH (ref 11.5–15.5)
WBC Count: 5.5 10*3/uL (ref 4.0–10.5)
nRBC: 0.7 % — ABNORMAL HIGH (ref 0.0–0.2)

## 2020-01-23 LAB — CMP (CANCER CENTER ONLY)
ALT: 49 U/L — ABNORMAL HIGH (ref 0–44)
AST: 40 U/L (ref 15–41)
Albumin: 2.8 g/dL — ABNORMAL LOW (ref 3.5–5.0)
Alkaline Phosphatase: 163 U/L — ABNORMAL HIGH (ref 38–126)
Anion gap: 3 — ABNORMAL LOW (ref 5–15)
BUN: 15 mg/dL (ref 8–23)
CO2: 30 mmol/L (ref 22–32)
Calcium: 8.9 mg/dL (ref 8.9–10.3)
Chloride: 107 mmol/L (ref 98–111)
Creatinine: 0.87 mg/dL (ref 0.61–1.24)
GFR, Estimated: >60 mL/min (ref >60)
Glucose, Bld: 177 mg/dL — ABNORMAL HIGH (ref 70–99)
Potassium: 4 mmol/L (ref 3.5–5.1)
Sodium: 140 mmol/L (ref 135–145)
Total Bilirubin: 2 mg/dL — ABNORMAL HIGH (ref 0.3–1.2)
Total Protein: 4.9 g/dL — ABNORMAL LOW (ref 6.5–8.1)

## 2020-01-23 LAB — SAVE SMEAR(SSMR), FOR PROVIDER SLIDE REVIEW

## 2020-01-23 LAB — CEA (IN HOUSE-CHCC): CEA (CHCC-In House): 323.14 ng/mL — ABNORMAL HIGH (ref 0.00–5.00)

## 2020-01-23 LAB — LACTATE DEHYDROGENASE: LDH: 351 U/L — ABNORMAL HIGH (ref 98–192)

## 2020-01-23 LAB — PROTIME-INR
INR: 1.5 — ABNORMAL HIGH (ref 0.8–1.2)
Prothrombin Time: 17.5 seconds — ABNORMAL HIGH (ref 11.4–15.2)

## 2020-01-23 MED ORDER — SODIUM CHLORIDE 0.9% FLUSH
10.0000 mL | Freq: Once | INTRAVENOUS | Status: AC
  Administered 2020-01-23: 10 mL
  Filled 2020-01-23: qty 10

## 2020-01-23 MED ORDER — HEPARIN SOD (PORK) LOCK FLUSH 100 UNIT/ML IV SOLN
500.0000 [IU] | Freq: Once | INTRAVENOUS | Status: DC
  Administered 2020-01-23: 500 [IU] via INTRAVENOUS
  Filled 2020-01-23: qty 5

## 2020-01-23 NOTE — Patient Instructions (Signed)

## 2020-01-23 NOTE — Progress Notes (Signed)
Hematology and Oncology Follow Up Visit  Zachary Willis [id number] [birth year] 75 y.o. 01/23/2020   Principle Diagnosis:  Metastatic colon cancer - progressive  Past Therapy: Status post RFA to the liver-11/21/2017  S/P Y-90 x 2 -- last therapy on 08/27/2018  Current Therapy: FOLFIRI -- started on 01/09/2019, s/p cycle12 -- d/c on 10/29/2019 Xeloda/Avastin -- cycle #1 to start on 11/23/2019   Interim History:  Zachary Willis is here today for follow-up.  Unfortunately, I think we have a new problem.  His platelet count keeps going down.  He has not been on Xeloda now for at least 3 to 4 weeks.  His white cell count is okay.  I looked at his blood smear.  I saw a couple nucleated red blood cells.  Because of this, I think we are going to have to get a bone marrow test on him.  I need to make sure that he does not have marrow infiltration by cancer.  Has had a couple nosebleeds.  He is off Coumadin.  His INR was 1.5.  He will stay off Coumadin until we get the thrombocytopenia issue resolved.  I suppose this should not surprise me if he does have marrow infiltration by malignancy.  I know this would be extremely unusual for colon cancer to do this but it would not be out of the realm of possibility.  His last CEA level was 347.  This is going up.  He is doing okay otherwise.  He is eating okay according to his wife.  He is not having diarrhea.  There is no mouth sores.  He has had no nausea or vomiting.  Currently, his performance status is ECOG 2.    Medications:  Allergies as of 01/23/2020   No Known Allergies     Medication List       Accurate as of January 23, 2020 12:42 PM. If you have any questions, ask your nurse or doctor.        allopurinol 300 MG tablet Commonly known as: ZYLOPRIM Take 300 mg by mouth daily.   atorvastatin 40 MG tablet Commonly known as: LIPITOR Take 40 mg by mouth daily.   capecitabine 500 MG tablet Commonly known as: XELODA Take 5  tablets (2,500 mg total) by mouth 2 (two) times daily after a meal. Take for 14 days, then hold for 7 days. Repeat every 21 days.   carvedilol 6.25 MG tablet Commonly known as: COREG Take 6.25 mg by mouth 2 (two) times daily with a meal.   [facility] Aspirin Adult Low Dose 81 MG chewable tablet Generic drug: aspirin Chew 81 mg by mouth daily.   ferrous sulfate 325 (65 FE) MG tablet Take 325 mg by mouth daily.   hydrocortisone 10 MG tablet Commonly known as: CORTEF Take 1-2 tablets (10-20 mg total) by mouth See admin instructions. 20mg  in AM 10mg  in PM   ibuprofen 200 MG tablet Commonly known as: ADVIL Take 400 mg by mouth every 6 (six) hours as needed for headache or mild pain.   levothyroxine 88 MCG tablet Commonly known as: SYNTHROID Take 88 mcg by mouth daily before breakfast.   NASAL WASH NA Place 1 spray into the nose daily.   ondansetron 8 MG tablet Commonly known as: Zofran Take 1 tablet (8 mg total) by mouth every 4 (four) hours as needed for nausea.   pantoprazole 40 MG tablet Commonly known as: PROTONIX Take 40 mg by mouth every morning.   polyvinyl alcohol 1.4 %  ophthalmic solution Commonly known as: LIQUIFILM TEARS Place 1 drop into both eyes daily as needed for dry eyes.   potassium chloride 10 MEQ tablet Commonly known as: KLOR-CON Take 40 mEq by mouth daily.   telmisartan 40 MG tablet Commonly known as: Micardis Take 1 tablet (40 mg total) by mouth daily.   testosterone 4 MG/24HR Pt24 patch Commonly known as: ANDRODERM Place 1 patch onto the skin daily.   warfarin 2 MG tablet Commonly known as: Coumadin Take 1 tablet (2 mg total) by mouth daily.       Allergies: No Known Allergies  Past Medical History, Surgical history, Social history, and Family History were reviewed and updated.  Review of Systems: Review of Systems  Constitutional: Negative.   HENT: Negative.   Eyes: Negative.   Respiratory: Negative.   Cardiovascular: Negative.    Gastrointestinal: Negative.   Genitourinary: Negative.   Musculoskeletal: Negative.   Skin: Negative.   Neurological: Negative.   Endo/Heme/Allergies: Negative.   Psychiatric/Behavioral: Negative.       Physical Exam:  weight is 253 lb (114.8 kg). His oral temperature is 98.4 F (36.9 C). His blood pressure is 122/80 and his pulse is 57 (abnormal). His respiration is 18 and oxygen saturation is 98%.   Wt Readings from Last 3 Encounters:  01/23/20 253 lb (114.8 kg)  01/16/20 243 lb (110.2 kg)  12/31/19 238 lb (108 kg)    Physical Exam Vitals reviewed.  HENT:     Head: Normocephalic and atraumatic.  Eyes:     Pupils: Pupils are equal, round, and reactive to light.  Cardiovascular:     Rate and Rhythm: Normal rate and regular rhythm.     Heart sounds: Normal heart sounds.     Comments: Cardiac exam shows a regular rate and rhythm.  There is some occasional extra beat.  I do not hear any obvious atrial fibrillation. Pulmonary:     Effort: Pulmonary effort is normal.     Breath sounds: Normal breath sounds.  Abdominal:     General: Bowel sounds are normal.     Palpations: Abdomen is soft.     Comments: Abdominal exam shows laparotomy scars.  He has no fluid wave.  He has no guarding or rebound tenderness.  There is no palpable liver or spleen tip.  Musculoskeletal:        General: No tenderness or deformity. Normal range of motion.     Cervical back: Normal range of motion.  Lymphadenopathy:     Cervical: No cervical adenopathy.  Skin:    General: Skin is warm and dry.     Findings: No erythema or rash.  Neurological:     Mental Status: He is alert and oriented to person, place, and time.  Psychiatric:        Behavior: Behavior normal.        Thought Content: Thought content normal.        Judgment: Judgment normal.    Lab Results  Component Value Date   WBC 5.5 01/23/2020   HGB 11.6 (L) 01/23/2020   HCT 36.3 (L) 01/23/2020   MCV 98.6 01/23/2020   PLT 46 (L)  01/23/2020   Lab Results  Component Value Date   FERRITIN 153 01/16/2020   IRON 83 01/16/2020   TIBC 274 01/16/2020   UIBC 190 01/16/2020   IRONPCTSAT 30 01/16/2020   Lab Results  Component Value Date   RBC 3.68 (L) 01/23/2020   No results found for: KPAFRELGTCHN, LAMBDASER, KAPLAMBRATIO  No results found for: IGGSERUM, IGA, IGMSERUM No results found for: Odetta Pink, SPEI   Chemistry      Component Value Date/Time   NA 140 01/23/2020 1107   K 4.0 01/23/2020 1107   CL 107 01/23/2020 1107   CO2 30 01/23/2020 1107   BUN 15 01/23/2020 1107   CREATININE 0.87 01/23/2020 1107      Component Value Date/Time   CALCIUM 8.9 01/23/2020 1107   ALKPHOS 163 (H) 01/23/2020 1107   AST 40 01/23/2020 1107   ALT 49 (H) 01/23/2020 1107   BILITOT 2.0 (H) 01/23/2020 1107      Impression and Plan: Zachary Willis is a very pleasant 75 yo caucasian gentleman with metastatic colon cancer.  I am unfortunately, suspecting that there might be marrow infiltration by malignancy.  Again, on the blood smear, there are a couple nucleated red blood cells which I really should not be seeing.  He will be set up with a bone marrow test next week.  This will be critical.  He is staying off the Coumadin now.  He is staying off Xeloda.  I just hope that we might be looking at ITP as a cause for the thrombocytopenia.    I think that if we do see marrow involvement by malignancy, we are going to have to think about palliative care for him and probably get hospice.  I just hate this.  Zachary Willis and his wife are both so nice.  He is done all that we have asked him to do.  He really has been motivated.  A lot is happened to him that it is not related to his cancer and he is got through all of this.   [name], MD 1/20/202212:42 PM

## 2020-01-24 ENCOUNTER — Telehealth: Payer: Self-pay | Admitting: Hematology & Oncology

## 2020-01-24 ENCOUNTER — Other Ambulatory Visit: Payer: Self-pay | Admitting: Hematology & Oncology

## 2020-01-24 ENCOUNTER — Encounter: Payer: Self-pay | Admitting: *Deleted

## 2020-01-24 NOTE — Telephone Encounter (Signed)
No los 1/20

## 2020-01-31 ENCOUNTER — Other Ambulatory Visit: Payer: Self-pay | Admitting: Student

## 2020-02-03 ENCOUNTER — Ambulatory Visit (HOSPITAL_COMMUNITY)
Admission: RE | Admit: 2020-02-03 | Discharge: 2020-02-03 | Disposition: A | Discharge status: Home / Self Care | Payer: Medicare HMO | Source: Ambulatory Visit | Attending: Hematology & Oncology | Admitting: Hematology & Oncology

## 2020-02-03 ENCOUNTER — Other Ambulatory Visit: Payer: Self-pay

## 2020-02-03 ENCOUNTER — Encounter (HOSPITAL_COMMUNITY): Payer: Self-pay

## 2020-02-03 DIAGNOSIS — C8590 Non-Hodgkin lymphoma, unspecified, unspecified site: Secondary | ICD-10-CM | POA: Diagnosis not present

## 2020-02-03 DIAGNOSIS — Z79899 Other long term (current) drug therapy: Secondary | ICD-10-CM | POA: Diagnosis not present

## 2020-02-03 DIAGNOSIS — Z791 Long term (current) use of non-steroidal anti-inflammatories (NSAID): Secondary | ICD-10-CM | POA: Diagnosis not present

## 2020-02-03 DIAGNOSIS — C189 Malignant neoplasm of colon, unspecified: Secondary | ICD-10-CM

## 2020-02-03 DIAGNOSIS — Z7982 Long term (current) use of aspirin: Secondary | ICD-10-CM | POA: Diagnosis not present

## 2020-02-03 DIAGNOSIS — D696 Thrombocytopenia, unspecified: Secondary | ICD-10-CM | POA: Insufficient documentation

## 2020-02-03 DIAGNOSIS — Z923 Personal history of irradiation: Secondary | ICD-10-CM | POA: Insufficient documentation

## 2020-02-03 DIAGNOSIS — D539 Nutritional anemia, unspecified: Secondary | ICD-10-CM | POA: Insufficient documentation

## 2020-02-03 DIAGNOSIS — Z9221 Personal history of antineoplastic chemotherapy: Secondary | ICD-10-CM | POA: Insufficient documentation

## 2020-02-03 DIAGNOSIS — Z79890 Hormone replacement therapy (postmenopausal): Secondary | ICD-10-CM | POA: Diagnosis not present

## 2020-02-03 DIAGNOSIS — Z7901 Long term (current) use of anticoagulants: Secondary | ICD-10-CM | POA: Diagnosis not present

## 2020-02-03 DIAGNOSIS — D72820 Lymphocytosis (symptomatic): Secondary | ICD-10-CM | POA: Diagnosis not present

## 2020-02-03 DIAGNOSIS — Z9049 Acquired absence of other specified parts of digestive tract: Secondary | ICD-10-CM | POA: Insufficient documentation

## 2020-02-03 DIAGNOSIS — Z8505 Personal history of malignant neoplasm of liver: Secondary | ICD-10-CM | POA: Insufficient documentation

## 2020-02-03 DIAGNOSIS — Z85038 Personal history of other malignant neoplasm of large intestine: Secondary | ICD-10-CM | POA: Diagnosis not present

## 2020-02-03 LAB — CBC WITH DIFFERENTIAL/PLATELET
Abs Immature Granulocytes: 0.13 10*3/uL — ABNORMAL HIGH (ref 0.00–0.07)
Basophils Absolute: 0 10*3/uL (ref 0.0–0.1)
Basophils Relative: 1 %
Eosinophils Absolute: 0.1 10*3/uL (ref 0.0–0.5)
Eosinophils Relative: 2 %
HCT: 39.2 % (ref 39.0–52.0)
Hemoglobin: 12.3 g/dL — ABNORMAL LOW (ref 13.0–17.0)
Immature Granulocytes: 3 %
Lymphocytes Relative: 21 %
Lymphs Abs: 1.1 10*3/uL (ref 0.7–4.0)
MCH: 31.7 pg (ref 26.0–34.0)
MCHC: 31.4 g/dL (ref 30.0–36.0)
MCV: 101 fL — ABNORMAL HIGH (ref 80.0–100.0)
Monocytes Absolute: 0.4 10*3/uL (ref 0.1–1.0)
Monocytes Relative: 8 %
Neutro Abs: 3.3 10*3/uL (ref 1.7–7.7)
Neutrophils Relative %: 65 %
Platelets: 52 10*3/uL — ABNORMAL LOW (ref 150–400)
RBC: 3.88 MIL/uL — ABNORMAL LOW (ref 4.22–5.81)
RDW: 19.4 % — ABNORMAL HIGH (ref 11.5–15.5)
WBC: 5 10*3/uL (ref 4.0–10.5)
nRBC: 0.6 % — ABNORMAL HIGH (ref 0.0–0.2)

## 2020-02-03 LAB — PROTIME-INR
INR: 1.1 (ref 0.8–1.2)
Prothrombin Time: 13.4 seconds (ref 11.4–15.2)

## 2020-02-03 MED ORDER — FENTANYL CITRATE (PF) 100 MCG/2ML IJ SOLN
INTRAMUSCULAR | Status: AC | PRN
  Administered 2020-02-03 (×2): 50 ug via INTRAVENOUS

## 2020-02-03 MED ORDER — HEPARIN SOD (PORK) LOCK FLUSH 100 UNIT/ML IV SOLN
500.0000 [IU] | Freq: Once | INTRAVENOUS | Status: AC
  Administered 2020-02-03: 500 [IU] via INTRAVENOUS
  Filled 2020-02-03: qty 5

## 2020-02-03 MED ORDER — LIDOCAINE HCL (PF) 1 % IJ SOLN
INTRAMUSCULAR | Status: AC | PRN
  Administered 2020-02-03: 5 mL

## 2020-02-03 MED ORDER — MIDAZOLAM HCL 2 MG/2ML IJ SOLN
INTRAMUSCULAR | Status: AC
  Filled 2020-02-03: qty 4

## 2020-02-03 MED ORDER — MIDAZOLAM HCL 2 MG/2ML IJ SOLN
INTRAMUSCULAR | Status: AC | PRN
  Administered 2020-02-03 (×2): 1 mg via INTRAVENOUS

## 2020-02-03 MED ORDER — HYDROCODONE-ACETAMINOPHEN 5-325 MG PO TABS: 1.0000 | ORAL_TABLET | ORAL | Status: DC | PRN

## 2020-02-03 MED ORDER — FENTANYL CITRATE (PF) 100 MCG/2ML IJ SOLN
INTRAMUSCULAR | Status: AC
  Filled 2020-02-03: qty 2

## 2020-02-03 MED ORDER — SODIUM CHLORIDE 0.9 % IV SOLN: INTRAVENOUS | Status: DC

## 2020-02-03 NOTE — Discharge Instructions (Signed)
Please call Interventional Radiology clinic [phone] with any questions or concerns.  You may remove your dressing and shower tomorrow.   Bone Marrow Aspiration and Bone Marrow Biopsy, Adult, Care After This sheet gives you information about how to care for yourself after your procedure. Your health care provider may also give you more specific instructions. If you have problems or questions, contact your health care provider. What can I expect after the procedure? After the procedure, it is common to have:  Mild pain and tenderness.  Swelling.  Bruising. Follow these instructions at home: Puncture site care  Follow instructions from your health care provider about how to take care of the puncture site. Make sure you: ? Wash your hands with soap and water before and after you change your bandage (dressing). If soap and water are not available, use hand sanitizer. ? Change your dressing as told by your health care provider.  Check your puncture site every day for signs of infection. Check for: ? More redness, swelling, or pain. ? Fluid or blood. ? Warmth. ? Pus or a bad smell.   Activity  Return to your normal activities as told by your health care provider. Ask your health care provider what activities are safe for you.  Do not lift anything that is heavier than 10 lb (4.5 kg), or the limit that you are told, until your health care provider says that it is safe.  Do not drive for 24 hours if you were given a sedative during your procedure. General instructions  Take over-the-counter and prescription medicines only as told by your health care provider.  Do not take baths, swim, or use a hot tub until your health care provider approves. Ask your health care provider if you may take showers. You may only be allowed to take sponge baths.  If directed, put ice on the affected area. To do this: ? Put ice in a plastic bag. ? Place a towel between your skin and the bag. ? Leave  the ice on for 20 minutes, 2-3 times a day.  Keep all follow-up visits as told by your health care provider. This is important.   Contact a health care provider if:  Your pain is not controlled with medicine.  You have a fever.  You have more redness, swelling, or pain around the puncture site.  You have fluid or blood coming from the puncture site.  Your puncture site feels warm to the touch.  You have pus or a bad smell coming from the puncture site. Summary  After the procedure, it is common to have mild pain, tenderness, swelling, and bruising.  Follow instructions from your health care provider about how to take care of the puncture site and what activities are safe for you.  Take over-the-counter and prescription medicines only as told by your health care provider.  Contact a health care provider if you have any signs of infection, such as fluid or blood coming from the puncture site. This information is not intended to replace advice given to you by your health care provider. Make sure you discuss any questions you have with your health care provider. Document Revised: 05/08/2018 Document Reviewed: 05/08/2018 Elsevier Patient Education  2021 Elsevier Inc.   Moderate Conscious Sedation, Adult, Care After This sheet gives you information about how to care for yourself after your procedure. Your health care provider may also give you more specific instructions. If you have problems or questions, contact your health care provider. What   soon. °Follow these instructions at home: °For the time period you were told by your health care provider: °Rest. °Do not participate in activities where you could fall or become injured. °Do not drive or use machinery. °Do not drink alcohol. °Do not take sleeping pills or medicines that cause drowsiness. °Do not  make important decisions or sign legal documents. °Do not take care of children on your own.  °  °  °Eating and drinking °Follow the diet recommended by your health care provider. °Drink enough fluid to keep your urine pale yellow. °If you vomit: °Drink water, juice, or soup when you can drink without vomiting. °Make sure you have little or no nausea before eating solid foods.   °General instructions °Take over-the-counter and prescription medicines only as told by your health care provider. °Have a responsible adult stay with you for the time you are told. It is important to have someone help care for you until you are awake and alert. °Do not smoke. °Keep all follow-up visits as told by your health care provider. This is important. °Contact a health care provider if: °You are still sleepy or having trouble with balance after 24 hours. °You feel light-headed. °You keep feeling nauseous or you keep vomiting. °You develop a rash. °You have a fever. °You have redness or swelling around the IV site. °Get help right away if: °You have trouble breathing. °You have new-onset confusion at home. °Summary °After the procedure, it is common to feel sleepy, have impaired judgment, or feel nauseous if you eat too soon. °Rest after you get home. Know the things you should not do after the procedure. °Follow the diet recommended by your health care provider and drink enough fluid to keep your urine pale yellow. °Get help right away if you have trouble breathing or new-onset confusion at home. °This information is not intended to replace advice given to you by your health care provider. Make sure you discuss any questions you have with your health care provider. °Document Revised: 04/19/2019 Document Reviewed: 11/15/2018 °Elsevier Patient Education © 2021 Elsevier Inc.  °

## 2020-02-03 NOTE — H&P (Signed)
Referring Physician(s): Ennever,Peter R  Supervising Physician: Mir, Sharen Heck  Patient Status:  [facility]    Chief Complaint:  "I'm here for a bone marrow biopsy"  Subjective: Patient familiar to IR service from right liver lesion biopsy in 2019, microwave ablation of liver lesions in 2019, and left and right hepatic Y-90 radioembolizations in 2020.  He has a history of metastatic colon cancer to the liver.  He now presents with worsening thrombocytopenia of uncertain etiology and is scheduled today for CT-guided bone marrow biopsy for further evaluation.  He currently denies fever, headache, chest pain, worsening dyspnea, cough, abdominal/back pain, nausea, vomiting or bleeding.  Additional history as below.  Past Medical History:  Diagnosis Date  . Anticoagulated on Coumadin    chronic long term  . Antineoplastic chemotherapy induced anemia   . Chronic atrial fibrillation [facility])    cardiologist--  dr Curly Rim [facility] in [place])--- hx DVVC in 2006/  TEE 10-21-2015 (care everywhere)  ef 55-60%, moderate dilated RA, mild MR, RVSP 31mHg  . CKD (chronic kidney disease), stage II   . Colon cancer metastasized to liver ([facility]    current oncologist-- dr eMarin Olp[facility]--- dx 12/ 2017,  Stage IIb ([id number]),  s/p  resection colon tumor 01-01-2016 ,  recurrent w/ liver mets via bx 08/ 2018  , had chemo then partial hepatectomy and completed chemo after surgery/ liver mets recurrence 08/ 2019  . Cushing's disease ([facility]    endocriniologist-- dr eElisabeth Most([facility]  . Droopy eyelid, right    11-15-2017  . GERD (gastroesophageal reflux disease)   . Goals of care, counseling/discussion 08/30/2017  . Gout    11-15-2017 per pt last flare-up 2018  . History of benign pituitary tumor    resection 07-25-2013  . History of cancer chemotherapy    COMPLETED 02-20-2017  FOR  COLON CANCER WITH LIVER METS  . History of radiation therapy    03/ 2016  remainder of  pituitary tumor  . Hypertension   . Hypertensive cardiovascular disease   . Hypothyroidism, secondary   . OA (osteoarthritis)   . Renal insufficiency   . [place]   . Secondary male hypogonadism   . Wears glasses   . Wears partial dentures    upper and lower   Past Surgical History:  Procedure Laterality Date  . COLOSTOMY TAKEDOWN  05-16-2016   '@[facility]'   . IR 3D INDEPENDENT [facility]  07/17/2018  . IR ANGIOGRAM SELECTIVE EACH ADDITIONAL VESSEL  07/17/2018  . IR ANGIOGRAM SELECTIVE EACH ADDITIONAL VESSEL  07/17/2018  . IR ANGIOGRAM SELECTIVE EACH ADDITIONAL VESSEL  07/17/2018  . IR ANGIOGRAM SELECTIVE EACH ADDITIONAL VESSEL  07/17/2018  . IR ANGIOGRAM SELECTIVE EACH ADDITIONAL VESSEL  07/17/2018  . IR ANGIOGRAM SELECTIVE EACH ADDITIONAL VESSEL  07/30/2018  . IR ANGIOGRAM SELECTIVE EACH ADDITIONAL VESSEL  07/30/2018  . IR ANGIOGRAM SELECTIVE EACH ADDITIONAL VESSEL  07/30/2018  . IR ANGIOGRAM SELECTIVE EACH ADDITIONAL VESSEL  07/30/2018  . IR ANGIOGRAM SELECTIVE EACH ADDITIONAL VESSEL  07/30/2018  . IR ANGIOGRAM SELECTIVE EACH ADDITIONAL VESSEL  08/27/2018  . IR ANGIOGRAM SELECTIVE EACH ADDITIONAL VESSEL  08/27/2018  . IR ANGIOGRAM VISCERAL SELECTIVE  07/17/2018  . IR ANGIOGRAM VISCERAL SELECTIVE  07/17/2018  . IR ANGIOGRAM VISCERAL SELECTIVE  08/27/2018  . IR EMBO ARTERIAL NOT HEMORR HEMANG INC GUIDE ROADMAPPING  07/17/2018  . IR EMBO TUMOR ORGAN ISCHEMIA INFARCT INC GUIDE ROADMAPPING  07/30/2018  . IR EMBO TUMOR ORGAN ISCHEMIA  INFARCT INC GUIDE ROADMAPPING  08/27/2018  . IR RADIOLOGIST EVAL & MGMT  10/10/2017  . IR RADIOLOGIST EVAL & MGMT  11/07/2017  . IR RADIOLOGIST EVAL & MGMT  12/21/2017  . IR RADIOLOGIST EVAL & MGMT  06/19/2018  . IR RADIOLOGIST EVAL & MGMT  09/19/2018  . IR RADIOLOGIST EVAL & MGMT  12/18/2018  . IR US GUIDE VASC ACCESS RIGHT  07/17/2018  . IR US GUIDE VASC ACCESS RIGHT  07/30/2018  . IR US GUIDE VASC ACCESS RIGHT  08/27/2018  . OPEN PARTIAL HEPATECTOMY    01-13-2017    '@[facility]'    AND CHOLECYSTECTOMY  . RADIOFREQUENCY ABLATION N/A 11/22/2017   Procedure: CT MICROWAVE ABLATION LIVER;  Surgeon: Sandi Mariscal, MD;  Location: [facility] ORS;  Service: Anesthesiology;  Laterality: N/A;  . REVISION TOTAL KNEE ARTHROPLASTY Left 12/20/2013  . SUBTOTAL COLECTOMY  01-01-2016  '@[facility]'    W/  CREATION ILEOSTOMY AND UMBILICAL HERNIA REPAIR  . TOTAL KNEE ARTHROPLASTY Bilateral left 11-16-2010;  right 10-26-2011  . TRANSPHENOIDAL PITUITARY RESECTION  07-25-2013   [facility]      Allergies: Patient has no known allergies.  Medications: Prior to Admission medications   Medication Sig Start Date End Date Taking? Authorizing Provider  atorvastatin (LIPITOR) 40 MG tablet Take 40 mg by mouth daily. 03/22/18  Yes [provider]  carvedilol (COREG) 6.25 MG tablet Take 6.25 mg by mouth 2 (two) times daily with a meal.  05/31/18  Yes [provider]  ferrous sulfate 325 (65 FE) MG tablet Take 325 mg by mouth daily.   Yes [provider]  hydrocortisone (CORTEF) 10 MG tablet Take 1-2 tablets (10-20 mg total) by mouth See admin instructions. 54m in AM 167min PM 09/20/19  Yes GrPatrecia PourMD  levothyroxine (SYNTHROID, LEVOTHROID) 88 MCG tablet Take 88 mcg by mouth daily before breakfast.  05/15/17  Yes [provider]  metFORMIN (GLUCOPHAGE) 500 MG tablet Take by mouth 2 (two) times daily with a meal.   Yes [provider]  NASAL WATuckerA Place 1 spray into the nose daily.    Yes [provider]  ondansetron (ZOFRAN) 8 MG tablet Take 1 tablet (8 mg total) by mouth every 4 (four) hours as needed for nausea. 12/26/19  Yes Delo, DoNathaneil CanaryMD  pantoprazole (PROTONIX) 40 MG tablet Take 40 mg by mouth every morning.  05/16/14  Yes [provider]  polyvinyl alcohol (LIQUIFILM TEARS) 1.4 % ophthalmic solution Place 1 drop into both eyes daily as needed for dry eyes.    Yes [provider]  potassium chloride (KLOR-CON)  10 MEQ tablet Take 40 mEq by mouth daily. 11/17/14  Yes [provider]  telmisartan (MICARDIS) 40 MG tablet Take 1 tablet (40 mg total) by mouth daily. 11/26/19  Yes EnVolanda NapoleonMD  allopurinol (ZYLOPRIM) 300 MG tablet Take 300 mg by mouth daily. 01/17/20   [provider]  capecitabine (XELODA) 500 MG tablet Take 5 tablets (2,500 mg total) by mouth 2 (two) times daily after a meal. Take for 14 days, then hold for 7 days. Repeat every 21 days. 10/31/19   EnVolanda NapoleonMD  [facility] ASPIRIN ADULT LOW DOSE 81 MG chewable tablet Chew 81 mg by mouth daily. 02/07/18   [provider]  ibuprofen (ADVIL) 200 MG tablet Take 400 mg by mouth every 6 (six) hours as needed for headache or mild pain.    [provider]  testosterone (ARenae Gloss4 MG/24HR PT24 patch Place  1 patch onto the skin daily.  04/17/15   [provider]  warfarin (COUMADIN) 2 MG tablet Take 1 tablet (2 mg total) by mouth daily. 01/09/20   [name], MD  prochlorperazine (COMPAZINE) 10 MG tablet Take 1 tablet (10 mg total) by mouth every 6 (six) hours as needed (NAUSEA). Patient not taking: Reported on 10/29/2019 12/18/18 10/29/19  [name], MD     Vital Signs: BP (!) 171/109   Pulse 64   Temp 97.7 F (36.5 C)   Resp 20   SpO2 99%   Physical Exam awake, alert.  Chest clear to auscultation bilaterally.  Clean, intact right chest wall Port-A-Cath.  Left anterior chest wall heart monitor noted.  Heart with normal rate, irregular rhythm.  Abdomen soft, positive bowel sounds, nontender.  Bilateral lower extremity edema noted.  Imaging: No results found.  Labs:  CBC: Recent Labs    12/26/19 1441 12/31/19 0858 01/16/20 1043 01/23/20 1107  WBC 7.9 9.2 4.8 5.5  HGB 13.0 13.3 12.0* 11.6*  HCT 39.7 40.3 38.1* 36.3*  PLT 225 150 59* 46*    COAGS: Recent Labs    09/11/19 1052 09/12/19 0300 01/09/20 0848 01/14/20 1010 01/16/20 1043 01/23/20 1108  INR 8.7*   < >  4.3* 5.5* 4.0* 1.5*  APTT 57*  --   --   --   --   --    < > = values in this interval not displayed.    BMP: Recent Labs    09/18/19 0529 09/19/19 0315 09/20/19 0440 10/07/19 1020 10/22/19 0927 12/26/19 1441 12/31/19 0858 01/16/20 1043 01/23/20 1107  NA 136 137 137 143   < > 137 144 142 140  K 2.9* 3.0* 3.1* 2.9*   < > 3.6 3.7 4.4 4.0  CL 102 100 100 101   < > 104 108 106 107  CO2 '29 30 31 ' 38*   < > 23 25 33* 30  GLUCOSE 252* 205* 229* 210*   < > 238* 136* 168* 177*  BUN 30* 25* 22 15   < > '21 14 15 15  ' CALCIUM 7.6* 7.5* 7.7* 8.9   < > 8.5* 9.1 9.1 8.9  CREATININE 1.11 0.88 0.85 0.89   < > 1.14 1.51* 0.90 0.87  GFRNONAA >60 >60 >60 >60   < > >60 49* >60 >60  GFRAA >60 >60 >60 >60  --   --   --   --   --    < > = values in this interval not displayed.    LIVER FUNCTION TESTS: Recent Labs    12/26/19 1441 12/31/19 0858 01/16/20 1043 01/23/20 1107  BILITOT 3.3* 1.6* 2.0* 2.0*  AST 31 37 61* 40  ALT 26 26 67* 49*  ALKPHOS 106 128* 194* 163*  PROT 5.6* 5.4* 4.6* 4.9*  ALBUMIN 3.0* 3.2* 2.9* 2.8*    Assessment and Plan: Patient familiar to IR service from right liver lesion biopsy in 2019, microwave ablation of liver lesions in 2019, and left and right hepatic Y-90 radioembolizations in 2020.  He has a history of metastatic colon cancer to the liver.  He now presents with worsening thrombocytopenia of uncertain etiology and is scheduled today for CT-guided bone marrow biopsy for further evaluation. Risks and benefits of procedure was discussed with the patient  including, but not limited to bleeding, infection, damage to adjacent structures or low yield requiring additional tests.  All of the questions were answered and there is agreement to  proceed.  Consent signed and in chart.     Electronically Signed: D. Rowe Robert, PA-C 02/03/2020, 8:21 AM   I spent a total of 20 minutes at the the patient's bedside AND on the patient's hospital floor or unit, greater  than 50% of which was counseling/coordinating care for CT-guided bone marrow biopsy

## 2020-02-03 NOTE — Procedures (Signed)
Interventional Radiology Procedure Note  Indication: Thrombocytopenia   Procedure: CT guided aspirate and core biopsy of right iliac bone  Complications: None  Bleeding: Minimal  Valary Manahan, MD [phone]   

## 2020-02-05 LAB — SURGICAL PATHOLOGY

## 2020-02-10 ENCOUNTER — Encounter (HOSPITAL_COMMUNITY): Payer: Self-pay | Admitting: Hematology & Oncology

## 2020-02-13 ENCOUNTER — Other Ambulatory Visit: Payer: Self-pay

## 2020-02-13 ENCOUNTER — Inpatient Hospital Stay (HOSPITAL_BASED_OUTPATIENT_CLINIC_OR_DEPARTMENT_OTHER): Payer: Medicare HMO | Admitting: Hematology & Oncology

## 2020-02-13 ENCOUNTER — Inpatient Hospital Stay: Payer: Medicare HMO

## 2020-02-13 ENCOUNTER — Other Ambulatory Visit: Payer: Self-pay | Admitting: *Deleted

## 2020-02-13 ENCOUNTER — Encounter: Payer: Self-pay | Admitting: Hematology & Oncology

## 2020-02-13 ENCOUNTER — Telehealth: Payer: Self-pay | Admitting: *Deleted

## 2020-02-13 ENCOUNTER — Inpatient Hospital Stay: Payer: Medicare HMO | Attending: Hematology & Oncology

## 2020-02-13 VITALS — BP 169/80 | Temp 97.8°F | Resp 59 | Wt 247.8 lb

## 2020-02-13 DIAGNOSIS — I480 Paroxysmal atrial fibrillation: Secondary | ICD-10-CM

## 2020-02-13 DIAGNOSIS — R739 Hyperglycemia, unspecified: Secondary | ICD-10-CM | POA: Diagnosis not present

## 2020-02-13 DIAGNOSIS — D649 Anemia, unspecified: Secondary | ICD-10-CM

## 2020-02-13 DIAGNOSIS — Z5112 Encounter for antineoplastic immunotherapy: Secondary | ICD-10-CM | POA: Diagnosis not present

## 2020-02-13 DIAGNOSIS — D693 Immune thrombocytopenic purpura: Secondary | ICD-10-CM | POA: Insufficient documentation

## 2020-02-13 DIAGNOSIS — D72820 Lymphocytosis (symptomatic): Secondary | ICD-10-CM

## 2020-02-13 DIAGNOSIS — C787 Secondary malignant neoplasm of liver and intrahepatic bile duct: Secondary | ICD-10-CM | POA: Diagnosis not present

## 2020-02-13 DIAGNOSIS — C8510 Unspecified B-cell lymphoma, unspecified site: Secondary | ICD-10-CM | POA: Insufficient documentation

## 2020-02-13 DIAGNOSIS — C189 Malignant neoplasm of colon, unspecified: Secondary | ICD-10-CM

## 2020-02-13 DIAGNOSIS — Z79899 Other long term (current) drug therapy: Secondary | ICD-10-CM | POA: Insufficient documentation

## 2020-02-13 DIAGNOSIS — Z7901 Long term (current) use of anticoagulants: Secondary | ICD-10-CM | POA: Insufficient documentation

## 2020-02-13 DIAGNOSIS — E0810 Diabetes mellitus due to underlying condition with ketoacidosis without coma: Secondary | ICD-10-CM | POA: Insufficient documentation

## 2020-02-13 DIAGNOSIS — R791 Abnormal coagulation profile: Secondary | ICD-10-CM

## 2020-02-13 DIAGNOSIS — K921 Melena: Secondary | ICD-10-CM

## 2020-02-13 HISTORY — DX: Lymphocytosis (symptomatic): D72.820

## 2020-02-13 HISTORY — DX: Immune thrombocytopenic purpura: D69.3

## 2020-02-13 LAB — CBC WITH DIFFERENTIAL (CANCER CENTER ONLY)
Abs Immature Granulocytes: 0.06 10*3/uL (ref 0.00–0.07)
Basophils Absolute: 0 10*3/uL (ref 0.0–0.1)
Basophils Relative: 1 %
Eosinophils Absolute: 0.1 10*3/uL (ref 0.0–0.5)
Eosinophils Relative: 1 %
HCT: 42 % (ref 39.0–52.0)
Hemoglobin: 13.2 g/dL (ref 13.0–17.0)
Immature Granulocytes: 1 %
Lymphocytes Relative: 15 %
Lymphs Abs: 0.7 10*3/uL (ref 0.7–4.0)
MCH: 31.7 pg (ref 26.0–34.0)
MCHC: 31.4 g/dL (ref 30.0–36.0)
MCV: 101 fL — ABNORMAL HIGH (ref 80.0–100.0)
Monocytes Absolute: 0.3 10*3/uL (ref 0.1–1.0)
Monocytes Relative: 7 %
Neutro Abs: 3.6 10*3/uL (ref 1.7–7.7)
Neutrophils Relative %: 75 %
Platelet Count: 66 10*3/uL — ABNORMAL LOW (ref 150–400)
RBC: 4.16 MIL/uL — ABNORMAL LOW (ref 4.22–5.81)
RDW: 17.2 % — ABNORMAL HIGH (ref 11.5–15.5)
WBC Count: 4.8 10*3/uL (ref 4.0–10.5)
nRBC: 0 % (ref 0.0–0.2)

## 2020-02-13 LAB — CMP (CANCER CENTER ONLY)
ALT: 32 U/L (ref 0–44)
AST: 34 U/L (ref 15–41)
Albumin: 3.4 g/dL — ABNORMAL LOW (ref 3.5–5.0)
Alkaline Phosphatase: 199 U/L — ABNORMAL HIGH (ref 38–126)
Anion gap: 4 — ABNORMAL LOW (ref 5–15)
BUN: 15 mg/dL (ref 8–23)
CO2: 34 mmol/L — ABNORMAL HIGH (ref 22–32)
Calcium: 9.6 mg/dL (ref 8.9–10.3)
Chloride: 106 mmol/L (ref 98–111)
Creatinine: 1.06 mg/dL (ref 0.61–1.24)
GFR, Estimated: >60 mL/min (ref >60)
Glucose, Bld: 209 mg/dL — ABNORMAL HIGH (ref 70–99)
Potassium: 4.9 mmol/L (ref 3.5–5.1)
Sodium: 144 mmol/L (ref 135–145)
Total Bilirubin: 1.6 mg/dL — ABNORMAL HIGH (ref 0.3–1.2)
Total Protein: 5.6 g/dL — ABNORMAL LOW (ref 6.5–8.1)

## 2020-02-13 LAB — HEPATITIS PANEL, ACUTE
HCV Ab: NONREACTIVE
Hep A IgM: NONREACTIVE
Hep B C IgM: NONREACTIVE
Hepatitis B Surface Ag: NONREACTIVE

## 2020-02-13 LAB — PROTIME-INR
INR: 1.1 (ref 0.8–1.2)
Prothrombin Time: 13.9 seconds (ref 11.4–15.2)

## 2020-02-13 NOTE — Progress Notes (Signed)
Hematology and Oncology Follow Up Visit  Zachary Willis Willis [id number] [birth year] 75 y.o. 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 We did go ahead and do a bone marrow biopsy on him. This was done on 02/03/2020. The bone marrow report ([id number]) showed a monoclonal B-cell population. This is a kappa restricted. 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  Clearly, this is causing the problems with Zachary Willis thrombocytopenia. He has a autoimmune type of thrombocytopenia. As such, I think we are going to have to treat the lymphoma. Because this is low-grade, I think single agent Rituxan would be reasonable. I will give him weekly Rituxan for 4 weeks. This should be able to get Zachary Willis platelet count back up.  I think he will tolerate this well.  I told him that he can probably get back on Coumadin. Zachary Willis INR is 1.1. I told Zachary Willis start Coumadin at 2 mg a day.  Metformin is causing diarrhea. He now is off Metformin.  We have not been able to treat the colon cancer. We have been stuck because of the thrombocytopenia.  He is not having any abdominal pain. He is having no problems with the bleeding. There is no nausea or vomiting. He has had no cough or shortness of breath.  Zachary Willis last CEA level was 323.  Overall, he feels well. He says he feels quite good. Zachary Willis Willis agrees.  Zachary Willis performance status right now is ECOG 1.    Medications:  Allergies as of 02/13/2020   No Known Allergies     Medication List       Accurate  as of February 13, 2020  2:34 PM. If you have any questions, ask your nurse or doctor.        STOP taking these medications   allopurinol 300 MG tablet Commonly known as: ZYLOPRIM Stopped by: PVolanda Napoleon MD     TAKE these medications   atorvastatin 40 MG tablet Commonly known as: LIPITOR Take 40 mg by mouth daily.   capecitabine 500 MG tablet Commonly known as: XELODA Take 5 tablets (2,500 mg total) by mouth 2 (two) times daily after a meal. Take for 14 days, then hold for 7 days. Repeat every 21 days.   carvedilol 6.25 MG tablet Commonly known as: COREG Take 6.25 mg by mouth 2 (two) times daily with a meal.   [facility] Aspirin Adult Low Dose 81 MG chewable tablet Generic drug: aspirin Chew 81 mg by mouth daily.   ferrous sulfate 325 (65 FE) MG tablet Take 325 mg by mouth daily.   furosemide 40 MG tablet Commonly known as: LASIX Take 20 mg by mouth every other day.   hydrocortisone 10 MG tablet Commonly known as: CORTEF Take 1-2 tablets (10-20 mg total) by mouth See admin instructions. 255min AM 1046mn PM   ibuprofen 200 MG tablet Commonly known as: ADVIL Take 400 mg by mouth every 6 (six) hours as needed for headache or mild pain.   levothyroxine 88 MCG tablet Commonly known as: SYNTHROID Take  88 mcg by mouth daily before breakfast.   metFORMIN 500 MG tablet Commonly known as: GLUCOPHAGE Take by mouth 2 (two) times daily with a meal.   NASAL WASH NA Place 1 spray into the nose daily.   ondansetron 8 MG tablet Commonly known as: Zofran Take 1 tablet (8 mg total) by mouth every 4 (four) hours as needed for nausea.   pantoprazole 40 MG tablet Commonly known as: PROTONIX Take 40 mg by mouth every morning.   polyvinyl alcohol 1.4 % ophthalmic solution Commonly known as: LIQUIFILM TEARS Place 1 drop into both eyes daily as needed for dry eyes.   potassium chloride 10 MEQ tablet Commonly known as: KLOR-CON Take 40 mEq by mouth daily.   telmisartan 40 MG  tablet Commonly known as: Micardis Take 1 tablet (40 mg total) by mouth daily.   testosterone 4 MG/24HR Pt24 patch Commonly known as: ANDRODERM Place 1 patch onto the skin daily.   warfarin 2 MG tablet Commonly known as: Coumadin Take 1 tablet (2 mg total) by mouth daily.       Allergies: No Known Allergies  Past Medical History, Surgical history, Social history, and Family History were reviewed and updated.  Review of Systems: Review of Systems  Constitutional: Negative.   HENT: Negative.   Eyes: Negative.   Respiratory: Negative.   Cardiovascular: Negative.   Gastrointestinal: Negative.   Genitourinary: Negative.   Musculoskeletal: Negative.   Skin: Negative.   Neurological: Negative.   Endo/Heme/Allergies: Negative.   Psychiatric/Behavioral: Negative.       Physical Exam:  weight is 247 lb 12.8 oz (112.4 kg). Zachary Willis oral temperature is 97.8 F (36.6 C). Zachary Willis blood pressure is 169/80 (abnormal). Zachary Willis respiration is 59 (abnormal) and oxygen saturation is 100%.   Wt Readings from Last 3 Encounters:  02/13/20 247 lb 12.8 oz (112.4 kg)  01/23/20 253 lb (114.8 kg)  01/16/20 243 lb (110.2 kg)    Physical Exam Vitals reviewed.  HENT:     Head: Normocephalic and atraumatic.  Eyes:     Pupils: Pupils are equal, round, and reactive to light.  Cardiovascular:     Rate and Rhythm: Normal rate and regular rhythm.     Heart sounds: Normal heart sounds.     Comments: Cardiac exam shows a regular rate and rhythm.  There is some occasional extra beat.  I do not hear any obvious atrial fibrillation. Pulmonary:     Effort: Pulmonary effort is normal.     Breath sounds: Normal breath sounds.  Abdominal:     General: Bowel sounds are normal.     Palpations: Abdomen is soft.     Comments: Abdominal exam shows laparotomy scars.  He has no fluid wave.  He has no guarding or rebound tenderness.  There is no palpable liver or spleen tip.  Musculoskeletal:        General: No  tenderness or deformity. Normal range of motion.     Cervical back: Normal range of motion.  Lymphadenopathy:     Cervical: No cervical adenopathy.  Skin:    General: Skin is warm and dry.     Findings: No erythema or rash.  Neurological:     Mental Status: He is alert and oriented to person, place, and time.  Psychiatric:        Behavior: Behavior normal.        Thought Content: Thought content normal.        Judgment: Judgment normal.    Lab Results  Component Value Date   WBC 4.8 02/13/2020   HGB 13.2 02/13/2020   HCT 42.0 02/13/2020   MCV 101.0 (H) 02/13/2020   PLT 66 (L) 02/13/2020   Lab Results  Component Value Date   FERRITIN 153 01/16/2020   IRON 83 01/16/2020   TIBC 274 01/16/2020   UIBC 190 01/16/2020   IRONPCTSAT 30 01/16/2020   Lab Results  Component Value Date   RBC 4.16 (L) 02/13/2020   No results found for: KPAFRELGTCHN, LAMBDASER, KAPLAMBRATIO No results found for: IGGSERUM, IGA, IGMSERUM No results found for: Kathrynn Ducking, MSPIKE, SPEI   Chemistry      Component Value Date/Time   NA 144 02/13/2020 1330   K 4.9 02/13/2020 1330   CL 106 02/13/2020 1330   CO2 34 (H) 02/13/2020 1330   BUN 15 02/13/2020 1330   CREATININE 1.06 02/13/2020 1330      Component Value Date/Time   CALCIUM 9.6 02/13/2020 1330   ALKPHOS 199 (H) 02/13/2020 1330   AST 34 02/13/2020 1330   ALT 32 02/13/2020 1330   BILITOT 1.6 (H) 02/13/2020 1330      Impression and Plan: Zachary Willis Willis is a very pleasant 75 yo caucasian gentleman with metastatic colon cancer.  I in lab that we did the bone marrow biopsy on him. It clearly shows that he has a new problem. This is a B-cell low-grade lymphoma. It is clearly leading to Zachary Willis thrombocytopenia. Again I think if we treat the lymphoma, Zachary Willis platelet count will normalize.  We will try to get started next week. I think this would be reasonable. I think weekly treatment for 4 weeks would be  effective.  I just hate the fact that he has a new issue. I am not sure how he would have developed it.  It still would be nice to be able to get back to Zachary Willis colon cancer therapy. I know that we are a little behind on this.  Again he will start Coumadin at 2 mg a day.  We will have to follow him closely. I will get him back to see me in 2 weeks.  [name], MD 2/10/20222:34 PM

## 2020-02-13 NOTE — Telephone Encounter (Signed)
Message received from patient's wife requesting biopsy results from 02/03/20.  Dr. Marin Olp notified and would like for pt to come in today for labs and Dr Marin Olp.  Message sent to scheduling.

## 2020-02-17 ENCOUNTER — Other Ambulatory Visit: Payer: Self-pay | Admitting: Hematology & Oncology

## 2020-02-19 ENCOUNTER — Other Ambulatory Visit: Payer: Self-pay | Admitting: *Deleted

## 2020-02-19 ENCOUNTER — Inpatient Hospital Stay: Payer: Medicare HMO

## 2020-02-19 ENCOUNTER — Other Ambulatory Visit: Payer: Self-pay

## 2020-02-19 VITALS — BP 141/71 | HR 90 | Temp 98.4°F | Resp 17

## 2020-02-19 DIAGNOSIS — D693 Immune thrombocytopenic purpura: Secondary | ICD-10-CM

## 2020-02-19 DIAGNOSIS — D649 Anemia, unspecified: Secondary | ICD-10-CM

## 2020-02-19 DIAGNOSIS — I480 Paroxysmal atrial fibrillation: Secondary | ICD-10-CM

## 2020-02-19 DIAGNOSIS — D72820 Lymphocytosis (symptomatic): Secondary | ICD-10-CM

## 2020-02-19 DIAGNOSIS — Z5112 Encounter for antineoplastic immunotherapy: Secondary | ICD-10-CM | POA: Diagnosis not present

## 2020-02-19 LAB — CBC WITH DIFFERENTIAL (CANCER CENTER ONLY)
Abs Immature Granulocytes: 0.09 10*3/uL — ABNORMAL HIGH (ref 0.00–0.07)
Basophils Absolute: 0 10*3/uL (ref 0.0–0.1)
Basophils Relative: 0 %
Eosinophils Absolute: 0.1 10*3/uL (ref 0.0–0.5)
Eosinophils Relative: 2 %
HCT: 40.6 % (ref 39.0–52.0)
Hemoglobin: 13.1 g/dL (ref 13.0–17.0)
Immature Granulocytes: 2 %
Lymphocytes Relative: 18 %
Lymphs Abs: 0.8 10*3/uL (ref 0.7–4.0)
MCH: 32 pg (ref 26.0–34.0)
MCHC: 32.3 g/dL (ref 30.0–36.0)
MCV: 99.3 fL (ref 80.0–100.0)
Monocytes Absolute: 0.5 10*3/uL (ref 0.1–1.0)
Monocytes Relative: 10 %
Neutro Abs: 3.2 10*3/uL (ref 1.7–7.7)
Neutrophils Relative %: 68 %
Platelet Count: 76 10*3/uL — ABNORMAL LOW (ref 150–400)
RBC: 4.09 MIL/uL — ABNORMAL LOW (ref 4.22–5.81)
RDW: 16.1 % — ABNORMAL HIGH (ref 11.5–15.5)
WBC Count: 4.7 10*3/uL (ref 4.0–10.5)
nRBC: 0.6 % — ABNORMAL HIGH (ref 0.0–0.2)

## 2020-02-19 LAB — COMPREHENSIVE METABOLIC PANEL
ALT: 32 U/L (ref 0–44)
AST: 31 U/L (ref 15–41)
Albumin: 3.4 g/dL — ABNORMAL LOW (ref 3.5–5.0)
Alkaline Phosphatase: 174 U/L — ABNORMAL HIGH (ref 38–126)
Anion gap: 6 (ref 5–15)
BUN: 15 mg/dL (ref 8–23)
CO2: 34 mmol/L — ABNORMAL HIGH (ref 22–32)
Calcium: 9.2 mg/dL (ref 8.9–10.3)
Chloride: 106 mmol/L (ref 98–111)
Creatinine, Ser: 1.03 mg/dL (ref 0.61–1.24)
GFR, Estimated: >60 mL/min (ref >60)
Glucose, Bld: 158 mg/dL — ABNORMAL HIGH (ref 70–99)
Potassium: 4.3 mmol/L (ref 3.5–5.1)
Sodium: 146 mmol/L — ABNORMAL HIGH (ref 135–145)
Total Bilirubin: 1.3 mg/dL — ABNORMAL HIGH (ref 0.3–1.2)
Total Protein: 5.8 g/dL — ABNORMAL LOW (ref 6.5–8.1)

## 2020-02-19 LAB — HEPATITIS B SURFACE ANTIGEN: Hepatitis B Surface Ag: NONREACTIVE

## 2020-02-19 LAB — PROTIME-INR
INR: 1.2 (ref 0.8–1.2)
Prothrombin Time: 14.3 seconds (ref 11.4–15.2)

## 2020-02-19 LAB — HEPATITIS B CORE ANTIBODY, TOTAL: Hep B Core Total Ab: NONREACTIVE

## 2020-02-19 MED ORDER — LIDOCAINE-PRILOCAINE 2.5-2.5 % EX CREA: TOPICAL_CREAM | CUTANEOUS | 3 refills | Status: DC

## 2020-02-19 MED ORDER — SODIUM CHLORIDE 0.9 % IV SOLN
Freq: Once | INTRAVENOUS | Status: AC
  Filled 2020-02-19: qty 250

## 2020-02-19 MED ORDER — ACETAMINOPHEN 325 MG PO TABS
ORAL_TABLET | ORAL | Status: AC
  Filled 2020-02-19: qty 2

## 2020-02-19 MED ORDER — SODIUM CHLORIDE 0.9% FLUSH
10.0000 mL | INTRAVENOUS | Status: DC | PRN
Start: 2020-02-19 — End: 2020-02-19
  Administered 2020-02-19: 10 mL
  Filled 2020-02-19: qty 10

## 2020-02-19 MED ORDER — CLONIDINE HCL 0.1 MG PO TABS
ORAL_TABLET | ORAL | Status: AC
  Filled 2020-02-19: qty 1

## 2020-02-19 MED ORDER — CLONIDINE HCL 0.1 MG PO TABS
0.1000 mg | ORAL_TABLET | Freq: Once | ORAL | Status: AC
  Administered 2020-02-19: 0.1 mg via ORAL

## 2020-02-19 MED ORDER — HEPARIN SOD (PORK) LOCK FLUSH 100 UNIT/ML IV SOLN
500.0000 [IU] | Freq: Once | INTRAVENOUS | Status: AC | PRN
  Administered 2020-02-19: 500 [IU]
  Filled 2020-02-19: qty 5

## 2020-02-19 MED ORDER — SODIUM CHLORIDE 0.9 % IV SOLN
375.0000 mg/m2 | Freq: Once | INTRAVENOUS | Status: AC
  Administered 2020-02-19: 900 mg via INTRAVENOUS
  Filled 2020-02-19: qty 40

## 2020-02-19 MED ORDER — DIPHENHYDRAMINE HCL 25 MG PO CAPS
ORAL_CAPSULE | ORAL | Status: AC
  Filled 2020-02-19: qty 2

## 2020-02-19 MED ORDER — ACETAMINOPHEN 325 MG PO TABS
650.0000 mg | ORAL_TABLET | Freq: Once | ORAL | Status: AC
  Administered 2020-02-19: 650 mg via ORAL

## 2020-02-19 MED ORDER — DIPHENHYDRAMINE HCL 25 MG PO CAPS
50.0000 mg | ORAL_CAPSULE | Freq: Once | ORAL | Status: AC
  Administered 2020-02-19: 50 mg via ORAL

## 2020-02-19 NOTE — Patient Instructions (Signed)
[facility] Discharge Instructions for Patients Receiving Chemotherapy  Today you received the following chemotherapy agents Rituxamab  To help prevent nausea and vomiting after your treatment, we encourage you to take your nausea medication as prescribed by MD.   If you develop nausea and vomiting that is not controlled by your nausea medication, call the clinic.   BELOW ARE SYMPTOMS THAT SHOULD BE REPORTED IMMEDIATELY:  *FEVER GREATER THAN 100.5 F  *CHILLS WITH OR WITHOUT FEVER  NAUSEA AND VOMITING THAT IS NOT CONTROLLED WITH YOUR NAUSEA MEDICATION  *UNUSUAL SHORTNESS OF BREATH  *UNUSUAL BRUISING OR BLEEDING  TENDERNESS IN MOUTH AND THROAT WITH OR WITHOUT PRESENCE OF ULCERS  *URINARY PROBLEMS  *BOWEL PROBLEMS  UNUSUAL RASH Items with * indicate a potential emergency and should be followed up as soon as possible.  Feel free to call the clinic should you have any questions or concerns. The clinic phone number is [phone].  Please show the [facility] at check-in to the Emergency Department and triage nurse.

## 2020-02-19 NOTE — Progress Notes (Signed)
1050: Rituxan infusion paused secondary to increased BP noted with infusion increase. Pt awake and aware. Pt denies any headache or dizziness. Pt states he took BP medications this AM. BP checked with different cuffs and different arms. Dr. Marin Olp aware of pt BP and at this time to continue infusion and check BP at next increase. Will continue to monitor.  1135: Dr. Marin Olp aware of pt current BP. Pt up to bathroom ambulatory and denies any headache or dizziness. At this time rituxan to be held and clonidine administered PO; refer to [facility]. Pt aware of plan of care and will re-assess in 30 minutes. Pt agreeable to plan.  1212: Vitals signs normal and pt agreeable to restart infusion at this time. Refer to [facility].  1515: Pt tolerated rest of infusion without difficulty. Vitals remained stable. Pt discharged ambulatory in no apparent distress.

## 2020-02-20 ENCOUNTER — Telehealth: Payer: Self-pay | Admitting: *Deleted

## 2020-02-20 NOTE — Telephone Encounter (Signed)
Patient called to see how he is feeling.  Feels very good this morning.  No issues from the Rituxan yesterday.

## 2020-02-26 ENCOUNTER — Inpatient Hospital Stay: Payer: Medicare HMO

## 2020-02-26 ENCOUNTER — Other Ambulatory Visit: Payer: Self-pay

## 2020-02-26 ENCOUNTER — Inpatient Hospital Stay: Payer: Medicare HMO | Admitting: Hematology & Oncology

## 2020-02-26 ENCOUNTER — Encounter: Payer: Self-pay | Admitting: Hematology & Oncology

## 2020-02-26 VITALS — BP 183/86 | HR 61 | Temp 98.4°F | Resp 19 | Wt 248.0 lb

## 2020-02-26 VITALS — BP 174/91 | HR 52 | Temp 98.3°F | Resp 16

## 2020-02-26 DIAGNOSIS — E0810 Diabetes mellitus due to underlying condition with ketoacidosis without coma: Secondary | ICD-10-CM

## 2020-02-26 DIAGNOSIS — D72820 Lymphocytosis (symptomatic): Secondary | ICD-10-CM

## 2020-02-26 DIAGNOSIS — C189 Malignant neoplasm of colon, unspecified: Secondary | ICD-10-CM | POA: Diagnosis not present

## 2020-02-26 DIAGNOSIS — C787 Secondary malignant neoplasm of liver and intrahepatic bile duct: Secondary | ICD-10-CM

## 2020-02-26 DIAGNOSIS — D693 Immune thrombocytopenic purpura: Secondary | ICD-10-CM

## 2020-02-26 DIAGNOSIS — Z5112 Encounter for antineoplastic immunotherapy: Secondary | ICD-10-CM | POA: Diagnosis not present

## 2020-02-26 LAB — CMP (CANCER CENTER ONLY)
ALT: 48 U/L — ABNORMAL HIGH (ref 0–44)
AST: 42 U/L — ABNORMAL HIGH (ref 15–41)
Albumin: 3.3 g/dL — ABNORMAL LOW (ref 3.5–5.0)
Alkaline Phosphatase: 231 U/L — ABNORMAL HIGH (ref 38–126)
Anion gap: 4 — ABNORMAL LOW (ref 5–15)
BUN: 17 mg/dL (ref 8–23)
CO2: 35 mmol/L — ABNORMAL HIGH (ref 22–32)
Calcium: 9.5 mg/dL (ref 8.9–10.3)
Chloride: 106 mmol/L (ref 98–111)
Creatinine: 0.94 mg/dL (ref 0.61–1.24)
GFR, Estimated: >60 mL/min (ref >60)
Glucose, Bld: 197 mg/dL — ABNORMAL HIGH (ref 70–99)
Potassium: 3.9 mmol/L (ref 3.5–5.1)
Sodium: 145 mmol/L (ref 135–145)
Total Bilirubin: 1.2 mg/dL (ref 0.3–1.2)
Total Protein: 5.8 g/dL — ABNORMAL LOW (ref 6.5–8.1)

## 2020-02-26 LAB — CBC WITH DIFFERENTIAL (CANCER CENTER ONLY)
Abs Immature Granulocytes: 0.16 10*3/uL — ABNORMAL HIGH (ref 0.00–0.07)
Basophils Absolute: 0 10*3/uL (ref 0.0–0.1)
Basophils Relative: 1 %
Eosinophils Absolute: 0.2 10*3/uL (ref 0.0–0.5)
Eosinophils Relative: 3 %
HCT: 41.5 % (ref 39.0–52.0)
Hemoglobin: 13.3 g/dL (ref 13.0–17.0)
Immature Granulocytes: 3 %
Lymphocytes Relative: 8 %
Lymphs Abs: 0.5 10*3/uL — ABNORMAL LOW (ref 0.7–4.0)
MCH: 31 pg (ref 26.0–34.0)
MCHC: 32 g/dL (ref 30.0–36.0)
MCV: 96.7 fL (ref 80.0–100.0)
Monocytes Absolute: 0.4 10*3/uL (ref 0.1–1.0)
Monocytes Relative: 7 %
Neutro Abs: 4.6 10*3/uL (ref 1.7–7.7)
Neutrophils Relative %: 78 %
Platelet Count: 67 10*3/uL — ABNORMAL LOW (ref 150–400)
RBC: 4.29 MIL/uL (ref 4.22–5.81)
RDW: 15.3 % (ref 11.5–15.5)
WBC Count: 5.9 10*3/uL (ref 4.0–10.5)
nRBC: 0.7 % — ABNORMAL HIGH (ref 0.0–0.2)

## 2020-02-26 LAB — PROTIME-INR
INR: 1.2 (ref 0.8–1.2)
Prothrombin Time: 15.1 seconds (ref 11.4–15.2)

## 2020-02-26 LAB — SAVE SMEAR(SSMR), FOR PROVIDER SLIDE REVIEW

## 2020-02-26 LAB — PLATELET BY CITRATE

## 2020-02-26 LAB — CEA (IN HOUSE-CHCC): CEA (CHCC-In House): 549.27 ng/mL — ABNORMAL HIGH (ref 0.00–5.00)

## 2020-02-26 MED ORDER — SODIUM CHLORIDE 0.9% FLUSH
10.0000 mL | INTRAVENOUS | Status: DC | PRN
  Administered 2020-02-26: 10 mL
  Filled 2020-02-26: qty 10

## 2020-02-26 MED ORDER — FAMOTIDINE IN NACL 20-0.9 MG/50ML-% IV SOLN
INTRAVENOUS | Status: AC
  Filled 2020-02-26: qty 50

## 2020-02-26 MED ORDER — ACETAMINOPHEN 325 MG PO TABS
650.0000 mg | ORAL_TABLET | Freq: Once | ORAL | Status: AC
  Administered 2020-02-26: 650 mg via ORAL

## 2020-02-26 MED ORDER — INSULIN ASPART 100 UNIT/ML ~~LOC~~ SOLN
15.0000 [IU] | Freq: Once | SUBCUTANEOUS | Status: DC
  Administered 2020-02-26: 15 [IU] via SUBCUTANEOUS
  Filled 2020-02-26: qty 0.15

## 2020-02-26 MED ORDER — FAMOTIDINE IN NACL 20-0.9 MG/50ML-% IV SOLN
20.0000 mg | Freq: Once | INTRAVENOUS | Status: AC
  Administered 2020-02-26: 20 mg via INTRAVENOUS

## 2020-02-26 MED ORDER — DIPHENHYDRAMINE HCL 25 MG PO CAPS
ORAL_CAPSULE | ORAL | Status: AC
  Filled 2020-02-26: qty 2

## 2020-02-26 MED ORDER — HEPARIN SOD (PORK) LOCK FLUSH 100 UNIT/ML IV SOLN
500.0000 [IU] | Freq: Once | INTRAVENOUS | Status: AC | PRN
  Administered 2020-02-26: 500 [IU]
  Filled 2020-02-26: qty 5

## 2020-02-26 MED ORDER — SODIUM CHLORIDE 0.9 % IV SOLN
Freq: Once | INTRAVENOUS | Status: AC
  Filled 2020-02-26: qty 250

## 2020-02-26 MED ORDER — FAMOTIDINE IN NACL 20-0.9 MG/50ML-% IV SOLN: 20.0000 mg | Freq: Once | INTRAVENOUS | Status: DC

## 2020-02-26 MED ORDER — SODIUM CHLORIDE 0.9 % IV SOLN
375.0000 mg/m2 | Freq: Once | INTRAVENOUS | Status: AC
  Administered 2020-02-26: 900 mg via INTRAVENOUS
  Filled 2020-02-26: qty 40

## 2020-02-26 MED ORDER — ACETAMINOPHEN 325 MG PO TABS
ORAL_TABLET | ORAL | Status: AC
  Filled 2020-02-26: qty 2

## 2020-02-26 MED ORDER — DIPHENHYDRAMINE HCL 25 MG PO CAPS
50.0000 mg | ORAL_CAPSULE | Freq: Once | ORAL | Status: AC
Start: 2020-02-26 — End: 2020-02-26
  Administered 2020-02-26: 50 mg via ORAL

## 2020-02-26 NOTE — Progress Notes (Signed)
Hematology and Oncology Follow Up Visit  Zachary Willis [id number] 1945/07/12 75 y.o. 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 He has first week of Rituxan last week. He did pretty well with this. He did take quite a while to get the Rituxan into him because of toxicity. However, he tolerated this well at the end.  His platelet count is still on the lower side. It was 67,000 today.  He is on Coumadin at 2 mg a day. His INR is 1.2. I think we will increase his Coumadin to 4 mg a day.  He has had no problems with bowels or bladder. He has had no issues with diarrhea.  His blood sugars are quite high. His fasting blood sugar was 200 today. He was given Ozempic by his endocrinologist. He has not yet started this. I told him that I thought it would be reasonable to start this.  He has had no cough or shortness of breath. He has had no chest wall pain. He has had no nausea or vomiting.  His last CEA level back in January was 323.    Medications:  Allergies as of 02/26/2020   No Known Allergies     Medication List       Accurate as of February 26, 2020  8:51 AM. If you have any questions, ask your nurse or doctor.        STOP taking these medications   [facility] Aspirin Adult Low Dose 81 MG chewable tablet Generic drug: aspirin Stopped by: [name], MD   metFORMIN 500 MG tablet Commonly known as: GLUCOPHAGE Stopped by: [name], MD     TAKE these medications   atorvastatin 40 MG tablet Commonly known as: LIPITOR Take 40 mg by mouth daily.    capecitabine 500 MG tablet Commonly known as: XELODA Take 5 tablets (2,500 mg total) by mouth 2 (two) times daily after a meal. Take for 14 days, then hold for 7 days. Repeat every 21 days.   carvedilol 6.25 MG tablet Commonly known as: COREG Take 6.25 mg by mouth 2 (two) times daily with a meal.   ferrous sulfate 325 (65 FE) MG tablet Take 325 mg by mouth daily.   furosemide 40 MG tablet Commonly known as: LASIX Take 20 mg by mouth every other day.   hydrocortisone 10 MG tablet Commonly known as: CORTEF Take 1-2 tablets (10-20 mg total) by mouth See admin instructions. 20mg  in AM 10mg  in PM   ibuprofen 200 MG tablet Commonly known as: ADVIL Take 400 mg by mouth every 6 (six) hours as needed for headache or mild pain.   levothyroxine 88 MCG tablet Commonly known as: SYNTHROID Take 88 mcg by mouth daily before breakfast.   lidocaine-prilocaine cream Commonly known as: EMLA Apply to affected area once   NASAL [place] NA Place 1 spray into the nose daily.   ondansetron 8 MG tablet Commonly known as: Zofran Take 1 tablet (8 mg total) by mouth every 4 (four) hours as needed for nausea.   Ozempic (0.25 or 0.5 MG/DOSE) 2 MG/1.5ML  Sopn Generic drug: Semaglutide(0.25 or 0.5MG /DOS) Inject into the skin.   pantoprazole 40 MG tablet Commonly known as: PROTONIX Take 40 mg by mouth every morning.   polyvinyl alcohol 1.4 % ophthalmic solution Commonly known as: LIQUIFILM TEARS Place 1 drop into both eyes daily as needed for dry eyes.   potassium chloride 10 MEQ tablet Commonly known as: KLOR-CON Take 40 mEq by mouth daily.   telmisartan 40 MG tablet Commonly known as: MICARDIS TAKE 1 TABLET BY MOUTH EVERY DAY   testosterone 4 MG/24HR Pt24 patch Commonly known as: ANDRODERM Place 1 patch onto the skin daily.   warfarin 2 MG tablet Commonly known as: Coumadin Take 1 tablet (2 mg total) by mouth daily.       Allergies: No Known Allergies  Past Medical History,  Surgical history, Social history, and Family History were reviewed and updated.  Review of Systems: Review of Systems  Constitutional: Negative.   HENT: Negative.   Eyes: Negative.   Respiratory: Negative.   Cardiovascular: Negative.   Gastrointestinal: Negative.   Genitourinary: Negative.   Musculoskeletal: Negative.   Skin: Negative.   Neurological: Negative.   Endo/Heme/Allergies: Negative.   Psychiatric/Behavioral: Negative.       Physical Exam:  weight is 248 lb (112.5 kg). His oral temperature is 98.4 F (36.9 C). His blood pressure is 183/86 (abnormal) and his pulse is 61. His respiration is 19 and oxygen saturation is 99%.   Wt Readings from Last 3 Encounters:  02/26/20 248 lb (112.5 kg)  02/13/20 247 lb 12.8 oz (112.4 kg)  01/23/20 253 lb (114.8 kg)    Physical Exam Vitals reviewed.  HENT:     Head: Normocephalic and atraumatic.  Eyes:     Pupils: Pupils are equal, round, and reactive to light.  Cardiovascular:     Rate and Rhythm: Normal rate and regular rhythm.     Heart sounds: Normal heart sounds.     Comments: Cardiac exam shows a regular rate and rhythm.  There is some occasional extra beat.  I do not hear any obvious atrial fibrillation. Pulmonary:     Effort: Pulmonary effort is normal.     Breath sounds: Normal breath sounds.  Abdominal:     General: Bowel sounds are normal.     Palpations: Abdomen is soft.     Comments: Abdominal exam shows laparotomy scars.  He has no fluid wave.  He has no guarding or rebound tenderness.  There is no palpable liver or spleen tip.  Musculoskeletal:        General: No tenderness or deformity. Normal range of motion.     Cervical back: Normal range of motion.  Lymphadenopathy:     Cervical: No cervical adenopathy.  Skin:    General: Skin is warm and dry.     Findings: No erythema or rash.  Neurological:     Mental Status: He is alert and oriented to person, place, and time.  Psychiatric:        Behavior:  Behavior normal.        Thought Content: Thought content normal.        Judgment: Judgment normal.    Lab Results  Component Value Date   WBC 5.9 02/26/2020   HGB 13.3 02/26/2020   HCT 41.5 02/26/2020   MCV 96.7 02/26/2020   PLT 67 (L) 02/26/2020   Lab Results  Component Value Date   FERRITIN 153 01/16/2020   IRON 83 01/16/2020   TIBC 274 01/16/2020  UIBC 190 01/16/2020   IRONPCTSAT 30 01/16/2020   Lab Results  Component Value Date   RBC 4.29 02/26/2020   No results found for: KPAFRELGTCHN, LAMBDASER, KAPLAMBRATIO No results found for: IGGSERUM, IGA, IGMSERUM No results found for: Odetta Pink, SPEI   Chemistry      Component Value Date/Time   NA 145 02/26/2020 0747   K 3.9 02/26/2020 0747   CL 106 02/26/2020 0747   CO2 35 (H) 02/26/2020 0747   BUN 17 02/26/2020 0747   CREATININE 0.94 02/26/2020 0747      Component Value Date/Time   CALCIUM 9.5 02/26/2020 0747   ALKPHOS 231 (H) 02/26/2020 0747   AST 42 (H) 02/26/2020 0747   ALT 48 (H) 02/26/2020 0747   BILITOT 1.2 02/26/2020 0747      Impression and Plan: Mr. Hiemstra is a very pleasant 75 yo caucasian gentleman with metastatic colon cancer.  We will see that his platelet count goes higher. Hopefully will go up. I know he is only had 1 week of the Rituxan.  We will see what his CEA level is. At some point, we will have to get him back on treatment for the colon cancer.  We will plan for another week of Rituxan. I will see him back in 2 weeks when he gets his fourth and final week of Rituxan.   [name], MD 2/23/20228:51 AM

## 2020-02-26 NOTE — Patient Instructions (Addendum)
[facility] Discharge Instructions for Patients Receiving Chemotherapy  Today you received the following chemotherapy agents Rituxamab  To help prevent nausea and vomiting after your treatment, we encourage you to take your nausea medication as prescribed by MD.   If you develop nausea and vomiting that is not controlled by your nausea medication, call the clinic.   BELOW ARE SYMPTOMS THAT SHOULD BE REPORTED IMMEDIATELY:  *FEVER GREATER THAN 100.5 F  *CHILLS WITH OR WITHOUT FEVER  NAUSEA AND VOMITING THAT IS NOT CONTROLLED WITH YOUR NAUSEA MEDICATION  *UNUSUAL SHORTNESS OF BREATH  *UNUSUAL BRUISING OR BLEEDING  TENDERNESS IN MOUTH AND THROAT WITH OR WITHOUT PRESENCE OF ULCERS  *URINARY PROBLEMS  *BOWEL PROBLEMS  UNUSUAL RASH Items with * indicate a potential emergency and should be followed up as soon as possible.  Feel free to call the clinic should you have any questions or concerns. The clinic phone number is [phone].  Please show the [facility] at check-in to the Emergency Department and triage nurse.  Rituximab Injection What is this medicine? RITUXIMAB (ri TUX i mab) is a monoclonal antibody. It is used to treat certain types of cancer like non-Hodgkin lymphoma and chronic lymphocytic leukemia. It is also used to treat rheumatoid arthritis, granulomatosis with polyangiitis, microscopic polyangiitis, and pemphigus vulgaris. This medicine may be used for other purposes; ask your health care provider or pharmacist if you have questions. COMMON BRAND NAME(S): RIABNI, Rituxan, RUXIENCE What should I tell my health care provider before I take this medicine? They need to know if you have any of these conditions: chest pain heart disease infection especially a viral infection such as chickenpox, cold sores, hepatitis B, or herpes immune system problems irregular heartbeat or rhythm kidney disease low blood counts (white cells, platelets, or red  cells) lung disease recent or upcoming vaccine an unusual or allergic reaction to rituximab, other medicines, foods, dyes, or preservatives pregnant or trying to get pregnant breast-feeding How should I use this medicine? This medicine is injected into a vein. It is given by a health care provider in a hospital or clinic setting. A special MedGuide will be given to you before each treatment. Be sure to read this information carefully each time. Talk to your health care provider about the use of this medicine in children. While this drug may be prescribed for children as young as 2 years for selected conditions, precautions do apply. Overdosage: If you think you have taken too much of this medicine contact a poison control center or emergency room at once. NOTE: This medicine is only for you. Do not share this medicine with others. What if I miss a dose? Keep appointments for follow-up doses. It is important not to miss your dose. Call your health care provider if you are unable to keep an appointment. What may interact with this medicine? Do not take this medicine with any of the following medicines: live vaccines This medicine may also interact with the following medicines: cisplatin This list may not describe all possible interactions. Give your health care provider a list of all the medicines, herbs, non-prescription drugs, or dietary supplements you use. Also tell them if you smoke, drink alcohol, or use illegal drugs. Some items may interact with your medicine. What should I watch for while using this medicine? Your condition will be monitored carefully while you are receiving this medicine. You may need blood work done while you are taking this medicine. This medicine can cause serious infusion  reactions. To reduce the risk your health care provider may give you other medicines to take before receiving this one. Be sure to follow the directions from your health care provider. This  medicine may increase your risk of getting an infection. Call your health care provider for advice if you get a fever, chills, sore throat, or other symptoms of a cold or flu. Do not treat yourself. Try to avoid being around people who are sick. Call your health care provider if you are around anyone with measles, chickenpox, or if you develop sores or blisters that do not heal properly. Avoid taking medicines that contain aspirin, acetaminophen, ibuprofen, naproxen, or ketoprofen unless instructed by your health care provider. These medicines may hide a fever. This medicine may cause serious skin reactions. They can happen weeks to months after starting the medicine. Contact your health care provider right away if you notice fevers or flu-like symptoms with a rash. The rash may be red or purple and then turn into blisters or peeling of the skin. Or, you might notice a red rash with swelling of the face, lips or lymph nodes in your neck or under your arms. In some patients, this medicine may cause a serious brain infection that may cause death. If you have any problems seeing, thinking, speaking, walking, or standing, tell your healthcare professional right away. If you cannot reach your healthcare professional, urgently seek other source of medical care. Do not become pregnant while taking this medicine or for at least 12 months after stopping it. Women should inform their health care provider if they wish to become pregnant or think they might be pregnant. There is potential for serious harm to an unborn child. Talk to your health care provider for more information. Women should use a reliable form of birth control while taking this medicine and for 12 months after stopping it. Do not breast-feed while taking this medicine or for at least 6 months after stopping it. What side effects may I notice from receiving this medicine? Side effects that you should report to your health care provider as soon as  possible: allergic reactions (skin rash, itching or hives; swelling of the face, lips, or tongue) diarrhea edema (sudden weight gain; swelling of the ankles, feet, hands or other unusual swelling; trouble breathing) fast, irregular heartbeat heart attack (trouble breathing; pain or tightness in the chest, neck, back or arms; unusually weak or tired) infection (fever, chills, cough, sore throat, pain or trouble passing urine) kidney injury (trouble passing urine or change in the amount of urine) liver injury (dark yellow or brown urine; general ill feeling or flu-like symptoms; loss of appetite, right upper belly pain; unusually weak or tired, yellowing of the eyes or skin) low blood pressure (dizziness; feeling faint or lightheaded, falls; unusually weak or tired) low red blood cell counts (trouble breathing; feeling faint; lightheaded, falls; unusually weak or tired) mouth sores redness, blistering, peeling, or loosening of the skin, including inside the mouth stomach pain unusual bruising or bleeding wheezing (trouble breathing with loud or whistling sounds) vomiting Side effects that usually do not require medical attention (report to your health care provider if they continue or are bothersome): headache joint pain muscle cramps, pain nausea This list may not describe all possible side effects. Call your doctor for medical advice about side effects. You may report side effects to FDA at 1-800-FDA-1088. Where should I keep my medicine? This medicine is given in a hospital or clinic. It will not be  stored at home. NOTE: This sheet is a summary. It may not cover all possible information. If you have questions about this medicine, talk to your doctor, pharmacist, or health care provider.  2021 [employer] Standard (2019-10-03 21:35:50)

## 2020-02-26 NOTE — Addendum Note (Signed)
Addended by: [name] on: 02/26/2020 09:14 AM   Modules accepted: Orders

## 2020-02-26 NOTE — Addendum Note (Signed)
Addended by: [name] on: 02/26/2020 09:34 AM   Modules accepted: Orders

## 2020-03-04 ENCOUNTER — Inpatient Hospital Stay: Payer: Medicare HMO | Attending: Hematology & Oncology

## 2020-03-04 ENCOUNTER — Encounter: Payer: Self-pay | Admitting: Hematology & Oncology

## 2020-03-04 ENCOUNTER — Other Ambulatory Visit: Payer: Medicare HMO

## 2020-03-04 ENCOUNTER — Telehealth: Payer: Self-pay

## 2020-03-04 ENCOUNTER — Inpatient Hospital Stay: Payer: Medicare HMO | Admitting: Hematology & Oncology

## 2020-03-04 ENCOUNTER — Inpatient Hospital Stay: Payer: Medicare HMO

## 2020-03-04 ENCOUNTER — Other Ambulatory Visit: Payer: Self-pay

## 2020-03-04 ENCOUNTER — Ambulatory Visit: Payer: Medicare HMO

## 2020-03-04 VITALS — BP 177/90 | HR 59 | Temp 97.8°F | Resp 17

## 2020-03-04 VITALS — BP 177/115 | HR 62 | Temp 98.0°F | Resp 18 | Ht 70.0 in | Wt 244.1 lb

## 2020-03-04 DIAGNOSIS — I10 Essential (primary) hypertension: Secondary | ICD-10-CM | POA: Diagnosis not present

## 2020-03-04 DIAGNOSIS — Z5112 Encounter for antineoplastic immunotherapy: Secondary | ICD-10-CM | POA: Insufficient documentation

## 2020-03-04 DIAGNOSIS — D72820 Lymphocytosis (symptomatic): Secondary | ICD-10-CM

## 2020-03-04 DIAGNOSIS — D693 Immune thrombocytopenic purpura: Secondary | ICD-10-CM | POA: Diagnosis not present

## 2020-03-04 DIAGNOSIS — C189 Malignant neoplasm of colon, unspecified: Secondary | ICD-10-CM

## 2020-03-04 DIAGNOSIS — Z79899 Other long term (current) drug therapy: Secondary | ICD-10-CM | POA: Diagnosis not present

## 2020-03-04 DIAGNOSIS — C8580 Other specified types of non-Hodgkin lymphoma, unspecified site: Secondary | ICD-10-CM | POA: Insufficient documentation

## 2020-03-04 DIAGNOSIS — C787 Secondary malignant neoplasm of liver and intrahepatic bile duct: Secondary | ICD-10-CM | POA: Diagnosis not present

## 2020-03-04 DIAGNOSIS — I4891 Unspecified atrial fibrillation: Secondary | ICD-10-CM | POA: Insufficient documentation

## 2020-03-04 LAB — CMP (CANCER CENTER ONLY)
ALT: 52 U/L — ABNORMAL HIGH (ref 0–44)
AST: 49 U/L — ABNORMAL HIGH (ref 15–41)
Albumin: 3.5 g/dL (ref 3.5–5.0)
Alkaline Phosphatase: 246 U/L — ABNORMAL HIGH (ref 38–126)
Anion gap: 6 (ref 5–15)
BUN: 13 mg/dL (ref 8–23)
CO2: 34 mmol/L — ABNORMAL HIGH (ref 22–32)
Calcium: 9.4 mg/dL (ref 8.9–10.3)
Chloride: 104 mmol/L (ref 98–111)
Creatinine: 0.94 mg/dL (ref 0.61–1.24)
GFR, Estimated: >60 mL/min (ref >60)
Glucose, Bld: 166 mg/dL — ABNORMAL HIGH (ref 70–99)
Potassium: 3.6 mmol/L (ref 3.5–5.1)
Sodium: 144 mmol/L (ref 135–145)
Total Bilirubin: 1.5 mg/dL — ABNORMAL HIGH (ref 0.3–1.2)
Total Protein: 6.1 g/dL — ABNORMAL LOW (ref 6.5–8.1)

## 2020-03-04 LAB — CBC WITH DIFFERENTIAL (CANCER CENTER ONLY)
Abs Immature Granulocytes: 0.1 10*3/uL — ABNORMAL HIGH (ref 0.00–0.07)
Basophils Absolute: 0.1 10*3/uL (ref 0.0–0.1)
Basophils Relative: 1 %
Eosinophils Absolute: 0.2 10*3/uL (ref 0.0–0.5)
Eosinophils Relative: 3 %
HCT: 44.8 % (ref 39.0–52.0)
Hemoglobin: 14.3 g/dL (ref 13.0–17.0)
Immature Granulocytes: 2 %
Lymphocytes Relative: 8 %
Lymphs Abs: 0.5 10*3/uL — ABNORMAL LOW (ref 0.7–4.0)
MCH: 31 pg (ref 26.0–34.0)
MCHC: 31.9 g/dL (ref 30.0–36.0)
MCV: 97 fL (ref 80.0–100.0)
Monocytes Absolute: 0.4 10*3/uL (ref 0.1–1.0)
Monocytes Relative: 6 %
Neutro Abs: 5.3 10*3/uL (ref 1.7–7.7)
Neutrophils Relative %: 80 %
Platelet Count: 80 10*3/uL — ABNORMAL LOW (ref 150–400)
RBC: 4.62 MIL/uL (ref 4.22–5.81)
RDW: 14.9 % (ref 11.5–15.5)
WBC Count: 6.5 10*3/uL (ref 4.0–10.5)
nRBC: 0 % (ref 0.0–0.2)

## 2020-03-04 LAB — PROTIME-INR
INR: 1.7 — ABNORMAL HIGH (ref 0.8–1.2)
Prothrombin Time: 19.5 seconds — ABNORMAL HIGH (ref 11.4–15.2)

## 2020-03-04 LAB — PLATELET BY CITRATE

## 2020-03-04 LAB — CEA (IN HOUSE-CHCC): CEA (CHCC-In House): 621.64 ng/mL — ABNORMAL HIGH (ref 0.00–5.00)

## 2020-03-04 LAB — SAVE SMEAR(SSMR), FOR PROVIDER SLIDE REVIEW

## 2020-03-04 MED ORDER — CLONIDINE HCL 0.1 MG PO TABS
ORAL_TABLET | ORAL | Status: AC
  Filled 2020-03-04: qty 1

## 2020-03-04 MED ORDER — HEPARIN SOD (PORK) LOCK FLUSH 100 UNIT/ML IV SOLN
500.0000 [IU] | Freq: Once | INTRAVENOUS | Status: DC | PRN
  Filled 2020-03-04: qty 5

## 2020-03-04 MED ORDER — CLONIDINE HCL 0.1 MG PO TABS
0.2000 mg | ORAL_TABLET | Freq: Once | ORAL | Status: AC
  Administered 2020-03-04: 0.2 mg via ORAL

## 2020-03-04 MED ORDER — SODIUM CHLORIDE 0.9 % IV SOLN: 7.5000 mg/kg | Freq: Once | INTRAVENOUS | Status: DC

## 2020-03-04 MED ORDER — SODIUM CHLORIDE 0.9% FLUSH
10.0000 mL | INTRAVENOUS | Status: DC | PRN
  Filled 2020-03-04: qty 10

## 2020-03-04 MED ORDER — SODIUM CHLORIDE 0.9 % IV SOLN
Freq: Once | INTRAVENOUS | Status: DC
  Filled 2020-03-04: qty 250

## 2020-03-04 MED ORDER — CLONIDINE HCL 0.1 MG PO TABS
ORAL_TABLET | ORAL | Status: AC
  Filled 2020-03-04: qty 2

## 2020-03-04 MED ORDER — DIPHENHYDRAMINE HCL 25 MG PO CAPS
ORAL_CAPSULE | ORAL | Status: AC
  Filled 2020-03-04: qty 2

## 2020-03-04 MED ORDER — CLONIDINE HCL 0.1 MG PO TABS: 0.2000 mg | ORAL_TABLET | Freq: Once | ORAL | Status: DC

## 2020-03-04 MED ORDER — FAMOTIDINE IN NACL 20-0.9 MG/50ML-% IV SOLN
20.0000 mg | Freq: Once | INTRAVENOUS | Status: AC
  Administered 2020-03-04: 20 mg via INTRAVENOUS

## 2020-03-04 MED ORDER — SODIUM CHLORIDE 0.9 % IV SOLN
Freq: Once | INTRAVENOUS | Status: AC
  Filled 2020-03-04: qty 250

## 2020-03-04 MED ORDER — DIPHENHYDRAMINE HCL 25 MG PO CAPS
50.0000 mg | ORAL_CAPSULE | Freq: Once | ORAL | Status: AC
  Administered 2020-03-04: 50 mg via ORAL

## 2020-03-04 MED ORDER — ACETAMINOPHEN 325 MG PO TABS
ORAL_TABLET | ORAL | Status: AC
  Filled 2020-03-04: qty 2

## 2020-03-04 MED ORDER — SODIUM CHLORIDE 0.9 % IV SOLN
375.0000 mg/m2 | Freq: Once | INTRAVENOUS | Status: AC
  Administered 2020-03-04: 900 mg via INTRAVENOUS
  Filled 2020-03-04: qty 50

## 2020-03-04 MED ORDER — ACETAMINOPHEN 325 MG PO TABS
650.0000 mg | ORAL_TABLET | Freq: Once | ORAL | Status: AC
  Administered 2020-03-04: 650 mg via ORAL

## 2020-03-04 MED ORDER — HEPARIN SOD (PORK) LOCK FLUSH 100 UNIT/ML IV SOLN
500.0000 [IU] | Freq: Once | INTRAVENOUS | Status: AC | PRN
  Administered 2020-03-04: 500 [IU]
  Filled 2020-03-04: qty 5

## 2020-03-04 MED ORDER — FAMOTIDINE IN NACL 20-0.9 MG/50ML-% IV SOLN
INTRAVENOUS | Status: AC
  Filled 2020-03-04: qty 50

## 2020-03-04 MED ORDER — CLONIDINE HCL 0.1 MG PO TABS
0.1000 mg | ORAL_TABLET | Freq: Once | ORAL | Status: AC
  Administered 2020-03-04: 0.1 mg via ORAL

## 2020-03-04 MED ORDER — SODIUM CHLORIDE 0.9% FLUSH
10.0000 mL | INTRAVENOUS | Status: DC | PRN
  Administered 2020-03-04: 10 mL
  Filled 2020-03-04: qty 10

## 2020-03-04 NOTE — Addendum Note (Signed)
Addended by: [name] on: 03/04/2020 09:38 AM   Modules accepted: Orders

## 2020-03-04 NOTE — Progress Notes (Signed)
Hematology and Oncology Follow Up Visit  Zachary Willis [id number] 01-11-1945 75 y.o. 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  He has had 2 cycles of Rituxan.  He is tolerating this pretty well.  His platelet count is 80,000 today.  Hopefully this is a trend upward.  His INR is 1.7.  He is on 4 mg of Coumadin a day.  There is no problems with diarrhea.  He has had no chest pain.  There is been no cough or shortness of breath.  He has had no bleeding.  He does have some increased leg swelling today.  His last CEA level was 550.  This is on the way up because of not been able to treat him secondary to the thrombocytopenia.  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  He has had no rashes.  He has had no nausea or vomiting.  Overall, his performance status is ECOG 1.  Medications:  Allergies as of 03/04/2020   No Known Allergies     Medication List       Accurate as of March 04, 2020  9:05 AM. If you have any questions, ask your nurse or doctor.        atorvastatin 40 MG tablet Commonly known as: LIPITOR Take 40 mg by mouth daily.   capecitabine 500 MG tablet Commonly known as: XELODA Take 5 tablets (2,500 mg total) by mouth 2 (two) times daily after a meal. Take for 14 days, then hold for 7 days. Repeat every 21 days.   carvedilol 6.25 MG tablet Commonly known as: COREG Take 6.25 mg by mouth 2 (two) times daily with a meal.   ferrous sulfate 325 (65 FE) MG tablet Take 325 mg by mouth daily.   furosemide 40 MG  tablet Commonly known as: LASIX Take 20 mg by mouth every other day.   hydrocortisone 10 MG tablet Commonly known as: CORTEF Take 1-2 tablets (10-20 mg total) by mouth See admin instructions. 20mg  in AM 10mg  in PM   ibuprofen 200 MG tablet Commonly known as: ADVIL Take 400 mg by mouth every 6 (six) hours as needed for headache or mild pain.   levothyroxine 88 MCG tablet Commonly known as: SYNTHROID Take 88 mcg by mouth daily before breakfast.   lidocaine-prilocaine cream Commonly known as: EMLA Apply to affected area once   NASAL [place] NA Place 1 spray into the nose daily.   ondansetron 8 MG tablet Commonly known as: Zofran Take 1 tablet (8 mg total) by mouth every 4 (four) hours as needed for nausea.   Ozempic (0.25 or 0.5 MG/DOSE) 2 MG/1.5ML Sopn Generic drug: Semaglutide(0.25 or 0.5MG /DOS) Inject into the skin.   pantoprazole 40 MG tablet Commonly known as: PROTONIX Take 40 mg by mouth every morning.   polyvinyl alcohol 1.4 % ophthalmic solution Commonly known as: LIQUIFILM TEARS Place 1 drop into both eyes daily as needed for dry eyes.   potassium chloride 10 MEQ tablet Commonly known as: KLOR-CON Take 40 mEq by mouth daily.   telmisartan  40 MG tablet Commonly known as: MICARDIS TAKE 1 TABLET BY MOUTH EVERY DAY   testosterone 4 MG/24HR Pt24 patch Commonly known as: ANDRODERM Place 1 patch onto the skin daily.   warfarin 2 MG tablet Commonly known as: Coumadin Take 1 tablet (2 mg total) by mouth daily.       Allergies: No Known Allergies  Past Medical History, Surgical history, Social history, and Family History were reviewed and updated.  Review of Systems: Review of Systems  Constitutional: Negative.   HENT: Negative.   Eyes: Negative.   Respiratory: Negative.   Cardiovascular: Negative.   Gastrointestinal: Negative.   Genitourinary: Negative.   Musculoskeletal: Negative.   Skin: Negative.   Neurological: Negative.   Endo/Heme/Allergies:  Negative.   Psychiatric/Behavioral: Negative.       Physical Exam:  height is 5\' 10"  (1.778 m) and weight is 244 lb 1.9 oz (110.7 kg). His oral temperature is 98 F (36.7 C). His blood pressure is 177/115 (abnormal) and his pulse is 62. His respiration is 18 and oxygen saturation is 98%.   Wt Readings from Last 3 Encounters:  03/04/20 244 lb 1.9 oz (110.7 kg)  02/26/20 248 lb (112.5 kg)  02/13/20 247 lb 12.8 oz (112.4 kg)    Physical Exam Vitals reviewed.  HENT:     Head: Normocephalic and atraumatic.  Eyes:     Pupils: Pupils are equal, round, and reactive to light.  Cardiovascular:     Rate and Rhythm: Normal rate and regular rhythm.     Heart sounds: Normal heart sounds.     Comments: Cardiac exam shows a regular rate and rhythm.  There is some occasional extra beat.  I do not hear any obvious atrial fibrillation. Pulmonary:     Effort: Pulmonary effort is normal.     Breath sounds: Normal breath sounds.  Abdominal:     General: Bowel sounds are normal.     Palpations: Abdomen is soft.     Comments: Abdominal exam shows laparotomy scars.  He has no fluid wave.  He has no guarding or rebound tenderness.  There is no palpable liver or spleen tip.  Musculoskeletal:        General: No tenderness or deformity. Normal range of motion.     Cervical back: Normal range of motion.  Lymphadenopathy:     Cervical: No cervical adenopathy.  Skin:    General: Skin is warm and dry.     Findings: No erythema or rash.  Neurological:     Mental Status: He is alert and oriented to person, place, and time.  Psychiatric:        Behavior: Behavior normal.        Thought Content: Thought content normal.        Judgment: Judgment normal.    Lab Results  Component Value Date   WBC 6.5 03/04/2020   HGB 14.3 03/04/2020   HCT 44.8 03/04/2020   MCV 97.0 03/04/2020   PLT 80 (L) 03/04/2020   Lab Results  Component Value Date   FERRITIN 153 01/16/2020   IRON 83 01/16/2020   TIBC 274  01/16/2020   UIBC 190 01/16/2020   IRONPCTSAT 30 01/16/2020   Lab Results  Component Value Date   RBC 4.62 03/04/2020   No results found for: KPAFRELGTCHN, LAMBDASER, KAPLAMBRATIO No results found for: IGGSERUM, IGA, IGMSERUM No results found for: TOTALPROTELP, ALBUMINELP, A1GS, A2GS, BETS, BETA2SER, GAMS, MSPIKE, SPEI   Chemistry      Component Value Date/Time  NA 144 03/04/2020 0812   K 3.6 03/04/2020 0812   CL 104 03/04/2020 0812   CO2 34 (H) 03/04/2020 0812   BUN 13 03/04/2020 0812   CREATININE 0.94 03/04/2020 0812      Component Value Date/Time   CALCIUM 9.4 03/04/2020 0812   ALKPHOS 246 (H) 03/04/2020 0812   AST 49 (H) 03/04/2020 0812   ALT 52 (H) 03/04/2020 0812   BILITOT 1.5 (H) 03/04/2020 1444      Impression and Plan: Zachary Willis is a very pleasant 75 yo caucasian gentleman with metastatic colon cancer.  Hopefully, the blood count will continue to go up.  I just hate the fact that the CEA level is also going up.  We would not be able to treat him because of the thrombocytopenia.  We will continue to follow him weekly for right now.  He will get his third cycle of Rituxan today.  He will come back in next week for his fourth cycle.    [name], MD 3/2/20229:05 AM

## 2020-03-04 NOTE — Patient Instructions (Signed)
Rituximab Injection What is this medicine? RITUXIMAB (ri TUX i mab) is a monoclonal antibody. It is used to treat certain types of cancer like non-Hodgkin lymphoma and chronic lymphocytic leukemia. It is also used to treat rheumatoid arthritis, granulomatosis with polyangiitis, microscopic polyangiitis, and pemphigus vulgaris. This medicine may be used for other purposes; ask your health care provider or pharmacist if you have questions. COMMON BRAND NAME(S): RIABNI, Rituxan, RUXIENCE What should I tell my health care provider before I take this medicine? They need to know if you have any of these conditions:  chest pain  heart disease  infection especially a viral infection such as chickenpox, cold sores, hepatitis B, or herpes  immune system problems  irregular heartbeat or rhythm  kidney disease  low blood counts (white cells, platelets, or red cells)  lung disease  recent or upcoming vaccine  an unusual or allergic reaction to rituximab, other medicines, foods, dyes, or preservatives  pregnant or trying to get pregnant  breast-feeding How should I use this medicine? This medicine is injected into a vein. It is given by a health care provider in a hospital or clinic setting. A special MedGuide will be given to you before each treatment. Be sure to read this information carefully each time. Talk to your health care provider about the use of this medicine in children. While this drug may be prescribed for children as young as 2 years for selected conditions, precautions do apply. Overdosage: If you think you have taken too much of this medicine contact a poison control center or emergency room at once. NOTE: This medicine is only for you. Do not share this medicine with others. What if I miss a dose? Keep appointments for follow-up doses. It is important not to miss your dose. Call your health care provider if you are unable to keep an appointment. What may interact with this  medicine? Do not take this medicine with any of the following medicines:  live vaccines This medicine may also interact with the following medicines:  cisplatin This list may not describe all possible interactions. Give your health care provider a list of all the medicines, herbs, non-prescription drugs, or dietary supplements you use. Also tell them if you smoke, drink alcohol, or use illegal drugs. Some items may interact with your medicine. What should I watch for while using this medicine? Your condition will be monitored carefully while you are receiving this medicine. You may need blood work done while you are taking this medicine. This medicine can cause serious infusion reactions. To reduce the risk your health care provider may give you other medicines to take before receiving this one. Be sure to follow the directions from your health care provider. This medicine may increase your risk of getting an infection. Call your health care provider for advice if you get a fever, chills, sore throat, or other symptoms of a cold or flu. Do not treat yourself. Try to avoid being around people who are sick. Call your health care provider if you are around anyone with measles, chickenpox, or if you develop sores or blisters that do not heal properly. Avoid taking medicines that contain aspirin, acetaminophen, ibuprofen, naproxen, or ketoprofen unless instructed by your health care provider. These medicines may hide a fever. This medicine may cause serious skin reactions. They can happen weeks to months after starting the medicine. Contact your health care provider right away if you notice fevers or flu-like symptoms with a rash. The rash may be red   or purple and then turn into blisters or peeling of the skin. Or, you might notice a red rash with swelling of the face, lips or lymph nodes in your neck or under your arms. In some patients, this medicine may cause a serious brain infection that may cause  death. If you have any problems seeing, thinking, speaking, walking, or standing, tell your healthcare professional right away. If you cannot reach your healthcare professional, urgently seek other source of medical care. Do not become pregnant while taking this medicine or for at least 12 months after stopping it. Women should inform their health care provider if they wish to become pregnant or think they might be pregnant. There is potential for serious harm to an unborn child. Talk to your health care provider for more information. Women should use a reliable form of birth control while taking this medicine and for 12 months after stopping it. Do not breast-feed while taking this medicine or for at least 6 months after stopping it. What side effects may I notice from receiving this medicine? Side effects that you should report to your health care provider as soon as possible:  allergic reactions (skin rash, itching or hives; swelling of the face, lips, or tongue)  diarrhea  edema (sudden weight gain; swelling of the ankles, feet, hands or other unusual swelling; trouble breathing)  fast, irregular heartbeat  heart attack (trouble breathing; pain or tightness in the chest, neck, back or arms; unusually weak or tired)  infection (fever, chills, cough, sore throat, pain or trouble passing urine)  kidney injury (trouble passing urine or change in the amount of urine)  liver injury (dark yellow or brown urine; general ill feeling or flu-like symptoms; loss of appetite, right upper belly pain; unusually weak or tired, yellowing of the eyes or skin)  low blood pressure (dizziness; feeling faint or lightheaded, falls; unusually weak or tired)  low red blood cell counts (trouble breathing; feeling faint; lightheaded, falls; unusually weak or tired)  mouth sores  redness, blistering, peeling, or loosening of the skin, including inside the mouth  stomach pain  unusual bruising or  bleeding  wheezing (trouble breathing with loud or whistling sounds)  vomiting Side effects that usually do not require medical attention (report to your health care provider if they continue or are bothersome):  headache  joint pain  muscle cramps, pain  nausea This list may not describe all possible side effects. Call your doctor for medical advice about side effects. You may report side effects to FDA at 1-800-FDA-1088. Where should I keep my medicine? This medicine is given in a hospital or clinic. It will not be stored at home. NOTE: This sheet is a summary. It may not cover all possible information. If you have questions about this medicine, talk to your doctor, pharmacist, or health care provider.  2021 Elsevier/Gold Standard (2019-10-03 21:35:50)

## 2020-03-04 NOTE — Progress Notes (Signed)
Pt receiving rituxamab-pvvr and with final infusion rate pt BP noted to be significantly higher. Dr. Marin Olp aware and orders received. Pt to continue with infusion; pt medicated per orders. Refer to [facility]. Pt denies any headache or dizziness. Pt denies pain. Pt had recently been up walking to restroom with even and steady gait.

## 2020-03-04 NOTE — Progress Notes (Signed)
OK to treat with today's platelets value and BP reading per Dr. Marin Olp. Will give Clonidine 0.2 mg per Dr. Marin Olp.

## 2020-03-04 NOTE — Telephone Encounter (Signed)
appts added per 03/04/20 los and pt to rec sch in tx/avs   Zachary Willis

## 2020-03-09 ENCOUNTER — Other Ambulatory Visit: Payer: Self-pay | Admitting: *Deleted

## 2020-03-09 DIAGNOSIS — I10 Essential (primary) hypertension: Secondary | ICD-10-CM

## 2020-03-09 MED ORDER — TELMISARTAN 80 MG PO TABS: 80.0000 mg | ORAL_TABLET | Freq: Every day | ORAL | 0 refills | Status: DC

## 2020-03-11 ENCOUNTER — Inpatient Hospital Stay: Payer: Medicare HMO

## 2020-03-11 ENCOUNTER — Other Ambulatory Visit: Payer: Medicare HMO

## 2020-03-12 ENCOUNTER — Other Ambulatory Visit: Payer: Self-pay

## 2020-03-12 ENCOUNTER — Inpatient Hospital Stay: Payer: Medicare HMO

## 2020-03-12 ENCOUNTER — Inpatient Hospital Stay: Payer: Medicare HMO | Admitting: Hematology & Oncology

## 2020-03-12 VITALS — BP 142/82 | HR 57 | Temp 98.9°F | Resp 19 | Wt 243.0 lb

## 2020-03-12 VITALS — BP 165/94 | HR 52 | Temp 98.6°F | Resp 17

## 2020-03-12 DIAGNOSIS — D72820 Lymphocytosis (symptomatic): Secondary | ICD-10-CM

## 2020-03-12 DIAGNOSIS — C189 Malignant neoplasm of colon, unspecified: Secondary | ICD-10-CM | POA: Diagnosis not present

## 2020-03-12 DIAGNOSIS — C787 Secondary malignant neoplasm of liver and intrahepatic bile duct: Secondary | ICD-10-CM | POA: Diagnosis not present

## 2020-03-12 DIAGNOSIS — Z5112 Encounter for antineoplastic immunotherapy: Secondary | ICD-10-CM | POA: Diagnosis not present

## 2020-03-12 DIAGNOSIS — D693 Immune thrombocytopenic purpura: Secondary | ICD-10-CM

## 2020-03-12 LAB — CBC WITH DIFFERENTIAL (CANCER CENTER ONLY)
Abs Immature Granulocytes: 0.12 10*3/uL — ABNORMAL HIGH (ref 0.00–0.07)
Basophils Absolute: 0.1 10*3/uL (ref 0.0–0.1)
Basophils Relative: 1 %
Eosinophils Absolute: 0.3 10*3/uL (ref 0.0–0.5)
Eosinophils Relative: 4 %
HCT: 46.6 % (ref 39.0–52.0)
Hemoglobin: 14.8 g/dL (ref 13.0–17.0)
Immature Granulocytes: 2 %
Lymphocytes Relative: 10 %
Lymphs Abs: 0.7 10*3/uL (ref 0.7–4.0)
MCH: 31 pg (ref 26.0–34.0)
MCHC: 31.8 g/dL (ref 30.0–36.0)
MCV: 97.5 fL (ref 80.0–100.0)
Monocytes Absolute: 0.6 10*3/uL (ref 0.1–1.0)
Monocytes Relative: 8 %
Neutro Abs: 5.1 10*3/uL (ref 1.7–7.7)
Neutrophils Relative %: 75 %
Platelet Count: 104 10*3/uL — ABNORMAL LOW (ref 150–400)
RBC: 4.78 MIL/uL (ref 4.22–5.81)
RDW: 14 % (ref 11.5–15.5)
WBC Count: 6.8 10*3/uL (ref 4.0–10.5)
nRBC: 0 % (ref 0.0–0.2)

## 2020-03-12 LAB — CMP (CANCER CENTER ONLY)
ALT: 57 U/L — ABNORMAL HIGH (ref 0–44)
AST: 53 U/L — ABNORMAL HIGH (ref 15–41)
Albumin: 3.5 g/dL (ref 3.5–5.0)
Alkaline Phosphatase: 264 U/L — ABNORMAL HIGH (ref 38–126)
Anion gap: 6 (ref 5–15)
BUN: 18 mg/dL (ref 8–23)
CO2: 35 mmol/L — ABNORMAL HIGH (ref 22–32)
Calcium: 9.4 mg/dL (ref 8.9–10.3)
Chloride: 104 mmol/L (ref 98–111)
Creatinine: 1.16 mg/dL (ref 0.61–1.24)
GFR, Estimated: >60 mL/min (ref >60)
Glucose, Bld: 157 mg/dL — ABNORMAL HIGH (ref 70–99)
Potassium: 3.9 mmol/L (ref 3.5–5.1)
Sodium: 145 mmol/L (ref 135–145)
Total Bilirubin: 1.2 mg/dL (ref 0.3–1.2)
Total Protein: 6.1 g/dL — ABNORMAL LOW (ref 6.5–8.1)

## 2020-03-12 LAB — PROTIME-INR
INR: 2.3 — ABNORMAL HIGH (ref 0.8–1.2)
Prothrombin Time: 24.4 seconds — ABNORMAL HIGH (ref 11.4–15.2)

## 2020-03-12 LAB — CEA (IN HOUSE-CHCC): CEA (CHCC-In House): 772.26 ng/mL — ABNORMAL HIGH (ref 0.00–5.00)

## 2020-03-12 LAB — LACTATE DEHYDROGENASE: LDH: 385 U/L — ABNORMAL HIGH (ref 98–192)

## 2020-03-12 MED ORDER — DIPHENHYDRAMINE HCL 25 MG PO CAPS
ORAL_CAPSULE | ORAL | Status: AC
  Filled 2020-03-12: qty 2

## 2020-03-12 MED ORDER — CLONIDINE HCL 0.1 MG PO TABS
0.1000 mg | ORAL_TABLET | Freq: Once | ORAL | Status: AC
  Administered 2020-03-12: 0.1 mg via ORAL

## 2020-03-12 MED ORDER — SODIUM CHLORIDE 0.9 % IV SOLN
Freq: Once | INTRAVENOUS | Status: AC
  Filled 2020-03-12: qty 250

## 2020-03-12 MED ORDER — DIPHENHYDRAMINE HCL 25 MG PO CAPS
50.0000 mg | ORAL_CAPSULE | Freq: Once | ORAL | Status: AC
  Administered 2020-03-12: 50 mg via ORAL

## 2020-03-12 MED ORDER — LONSURF 15-6.14 MG PO TABS
35.0000 mg/m2 | ORAL_TABLET | Freq: Two times a day (BID) | ORAL | 4 refills | Status: DC
  Filled 2020-04-13: qty 100, 28d supply, fill #0
  Filled 2020-05-11: qty 100, 28d supply, fill #1
  Filled 2020-06-08: qty 100, 28d supply, fill #2

## 2020-03-12 MED ORDER — SODIUM CHLORIDE 0.9 % IV SOLN
375.0000 mg/m2 | Freq: Once | INTRAVENOUS | Status: AC
  Administered 2020-03-12: 900 mg via INTRAVENOUS
  Filled 2020-03-12: qty 50

## 2020-03-12 MED ORDER — ACETAMINOPHEN 325 MG PO TABS
650.0000 mg | ORAL_TABLET | Freq: Once | ORAL | Status: AC
  Administered 2020-03-12: 650 mg via ORAL

## 2020-03-12 MED ORDER — FAMOTIDINE IN NACL 20-0.9 MG/50ML-% IV SOLN
20.0000 mg | Freq: Once | INTRAVENOUS | Status: AC
  Administered 2020-03-12: 20 mg via INTRAVENOUS

## 2020-03-12 MED ORDER — FAMOTIDINE IN NACL 20-0.9 MG/50ML-% IV SOLN
INTRAVENOUS | Status: AC
  Filled 2020-03-12: qty 50

## 2020-03-12 MED ORDER — SODIUM CHLORIDE 0.9% FLUSH
10.0000 mL | INTRAVENOUS | Status: DC | PRN
  Administered 2020-03-12: 10 mL
  Filled 2020-03-12: qty 10

## 2020-03-12 MED ORDER — HEPARIN SOD (PORK) LOCK FLUSH 100 UNIT/ML IV SOLN
500.0000 [IU] | Freq: Once | INTRAVENOUS | Status: AC | PRN
  Administered 2020-03-12: 500 [IU]
  Filled 2020-03-12: qty 5

## 2020-03-12 MED ORDER — ACETAMINOPHEN 325 MG PO TABS
ORAL_TABLET | ORAL | Status: AC
  Filled 2020-03-12: qty 2

## 2020-03-12 NOTE — Progress Notes (Signed)
Hematology and Oncology Follow Up Visit  Zachary Willis [id number] Aug 17, 1945 75 y.o. 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  His platelet count started to come up.  His platelet count is 104,000 today.  I think that we are now in a position to try to get him back onto treatment for the colon cancer.  His last CEA level was about 630.  I think that we should probably switch from Xeloda and try him on Lonsurf along with Avastin.  I think this might be a reasonable combination to try.  We have to watch out for his Coumadin.  Today, his INR is 2.3 which is nice to see.  He has had no problems with abdominal pain.  His blood pressures have been quite erratic.  There have been quite high.  He now is on Micardis 80 mg a day to try to help with blood pressure control.  He has had no problems with bowels or bladder.  He has had no cough or shortness of breath.  The atrial fibrillation is doing okay.  He has little bit of leg swelling.  He is always had a little bit more swelling in the right leg than the left leg.  His appetite is decent.  Overall, his performance status is ECOG 2.     Medications:  Allergies as of 03/12/2020   No Known Allergies     Medication List       Accurate as of March 12, 2020  9:25 AM. If you have any questions, ask your nurse or doctor.        atorvastatin 40 MG tablet Commonly known as: LIPITOR Take 40 mg by mouth daily.   capecitabine 500 MG tablet Commonly known as:  XELODA Take 5 tablets (2,500 mg total) by mouth 2 (two) times daily after a meal. Take for 14 days, then hold for 7 days. Repeat every 21 days.   carvedilol 6.25 MG tablet Commonly known as: COREG Take 6.25 mg by mouth 2 (two) times daily with a meal.   ferrous sulfate 325 (65 FE) MG tablet Take 325 mg by mouth daily.   furosemide 40 MG tablet Commonly known as: LASIX Take 20 mg by mouth every other day.   hydrocortisone 10 MG tablet Commonly known as: CORTEF Take 1-2 tablets (10-20 mg total) by mouth See admin instructions. 20mg  in AM 10mg  in PM   ibuprofen 200 MG tablet Commonly known as: ADVIL Take 400 mg by mouth every 6 (six) hours as needed for headache or mild pain.   levothyroxine 88 MCG tablet Commonly known as: SYNTHROID Take 88 mcg by mouth daily before breakfast.   lidocaine-prilocaine cream Commonly known as: EMLA Apply to affected area once   NASAL [place] NA Place 1 spray into the nose daily.   ondansetron 8 MG tablet Commonly known as: Zofran Take 1 tablet (8 mg total) by mouth every 4 (four) hours as needed for nausea.   Ozempic (0.25 or 0.5 MG/DOSE) 2 MG/1.5ML Sopn Generic drug: Semaglutide(0.25  or 0.5MG /DOS) Inject into the skin.   pantoprazole 40 MG tablet Commonly known as: PROTONIX Take 40 mg by mouth every morning.   polyvinyl alcohol 1.4 % ophthalmic solution Commonly known as: LIQUIFILM TEARS Place 1 drop into both eyes daily as needed for dry eyes.   potassium chloride 10 MEQ tablet Commonly known as: KLOR-CON Take 40 mEq by mouth daily.   telmisartan 80 MG tablet Commonly known as: MICARDIS Take 1 tablet (80 mg total) by mouth daily.   testosterone 4 MG/24HR Pt24 patch Commonly known as: ANDRODERM Place 1 patch onto the skin daily.   warfarin 2 MG tablet Commonly known as: Coumadin Take 1 tablet (2 mg total) by mouth daily. What changed: when to take this       Allergies: No Known Allergies  Past Medical History, Surgical  history, Social history, and Family History were reviewed and updated.  Review of Systems: Review of Systems  Constitutional: Negative.   HENT: Negative.   Eyes: Negative.   Respiratory: Negative.   Cardiovascular: Negative.   Gastrointestinal: Negative.   Genitourinary: Negative.   Musculoskeletal: Negative.   Skin: Negative.   Neurological: Negative.   Endo/Heme/Allergies: Negative.   Psychiatric/Behavioral: Negative.       Physical Exam:  weight is 243 lb (110.2 kg). His oral temperature is 98.9 F (37.2 C). His blood pressure is 142/82 (abnormal) and his pulse is 57 (abnormal). His respiration is 19 and oxygen saturation is 98%.   Wt Readings from Last 3 Encounters:  03/12/20 243 lb (110.2 kg)  03/04/20 244 lb 1.9 oz (110.7 kg)  02/26/20 248 lb (112.5 kg)    Physical Exam Vitals reviewed.  HENT:     Head: Normocephalic and atraumatic.  Eyes:     Pupils: Pupils are equal, round, and reactive to light.  Cardiovascular:     Rate and Rhythm: Normal rate and regular rhythm.     Heart sounds: Normal heart sounds.     Comments: Cardiac exam shows a regular rate and rhythm.  There is some occasional extra beat.  I do not hear any obvious atrial fibrillation. Pulmonary:     Effort: Pulmonary effort is normal.     Breath sounds: Normal breath sounds.  Abdominal:     General: Bowel sounds are normal.     Palpations: Abdomen is soft.     Comments: Abdominal exam shows laparotomy scars.  He has no fluid wave.  He has no guarding or rebound tenderness.  There is no palpable liver or spleen tip.  Musculoskeletal:        General: No tenderness or deformity. Normal range of motion.     Cervical back: Normal range of motion.  Lymphadenopathy:     Cervical: No cervical adenopathy.  Skin:    General: Skin is warm and dry.     Findings: No erythema or rash.  Neurological:     Mental Status: He is alert and oriented to person, place, and time.  Psychiatric:        Behavior:  Behavior normal.        Thought Content: Thought content normal.        Judgment: Judgment normal.    Lab Results  Component Value Date   WBC 6.8 03/12/2020   HGB 14.8 03/12/2020   HCT 46.6 03/12/2020   MCV 97.5 03/12/2020   PLT 104 (L) 03/12/2020   Lab Results  Component Value Date   FERRITIN 153 01/16/2020   IRON 83 01/16/2020  TIBC 274 01/16/2020   UIBC 190 01/16/2020   IRONPCTSAT 30 01/16/2020   Lab Results  Component Value Date   RBC 4.78 03/12/2020   No results found for: KPAFRELGTCHN, LAMBDASER, KAPLAMBRATIO No results found for: IGGSERUM, IGA, IGMSERUM No results found for: Odetta Pink, SPEI   Chemistry      Component Value Date/Time   NA 145 03/12/2020 0812   K 3.9 03/12/2020 0812   CL 104 03/12/2020 0812   CO2 35 (H) 03/12/2020 0812   BUN 18 03/12/2020 0812   CREATININE 1.16 03/12/2020 0812      Component Value Date/Time   CALCIUM 9.4 03/12/2020 0812   ALKPHOS 264 (H) 03/12/2020 0812   AST 53 (H) 03/12/2020 0812   ALT 57 (H) 03/12/2020 0812   BILITOT 1.2 03/12/2020 0812      Impression and Plan: Mr. Rape is a very pleasant 75 yo caucasian gentleman with metastatic colon cancer.  His platelet count is coming up.  Hopefully, we are starting to see the Rituxan helping with this low-grade lymphoma process in his bone marrow.  We will give him the fourth Rituxan dose today.  We will get him on Lonsurf along with Avastin.  We will have to be very cautious with his blood pressure.  I talked him about the Lonsurf.  I went over side effects.  Again he would lose his hair.  We will have to watch his blood counts closely.  Again we have to be careful with his Coumadin.  Diarrhea may be an issue.  I told him that Lonsurf is taken 5 days in a row and then off for 2 days and then 5 days in a row again and then off for 2 weeks.  We may have to give him a little bit of a dose reduction of the Lonsurf given  all of his issues and the fact that has had past chemotherapy.  He understands all of this.  He is agreeable to the [place].  We probably have to get this going in a week or so.  I will plan to get him back to see me in about 3 weeks.  Hopefully, the blood count will continue to go up.  I just hate the fact that the CEA level is also going up.  We would not be able to treat him because of the thrombocytopenia.  We will continue to follow him weekly for right now.  He will get his third cycle of Rituxan today.  He will come back in next week for his fourth cycle.    [name], MD 3/10/20229:25 AM

## 2020-03-12 NOTE — Patient Instructions (Signed)
Zachary Willis Discharge Instructions for Patients Receiving Chemotherapy  Today you received the following chemotherapy agents Rituxamab  To help prevent nausea and vomiting after your treatment, we encourage you to take your nausea medication as prescribed by MD.   If you develop nausea and vomiting that is not controlled by your nausea medication, call the clinic.   BELOW ARE SYMPTOMS THAT SHOULD BE REPORTED IMMEDIATELY:  *FEVER GREATER THAN 100.5 F  *CHILLS WITH OR WITHOUT FEVER  NAUSEA AND VOMITING THAT IS NOT CONTROLLED WITH YOUR NAUSEA MEDICATION  *UNUSUAL SHORTNESS OF BREATH  *UNUSUAL BRUISING OR BLEEDING  TENDERNESS IN MOUTH AND THROAT WITH OR WITHOUT PRESENCE OF ULCERS  *URINARY PROBLEMS  *BOWEL PROBLEMS  UNUSUAL RASH Items with * indicate a potential emergency and should be followed up as soon as possible.  Feel free to call the clinic should you have any questions or concerns. The clinic phone number is [phone].  Please show the [place] at check-in to the Emergency Department and triage nurse.

## 2020-03-13 ENCOUNTER — Telehealth: Payer: Self-pay | Admitting: Pharmacy Technician

## 2020-03-13 ENCOUNTER — Telehealth: Payer: Self-pay

## 2020-03-13 ENCOUNTER — Telehealth: Payer: Self-pay | Admitting: Pharmacist

## 2020-03-13 NOTE — Telephone Encounter (Signed)
Called and left a vm with 03/12/20 los appts    Zachary Willis

## 2020-03-13 NOTE — Telephone Encounter (Signed)
Oral Oncology Patient Advocate Encounter  Received notification from [facility] that prior authorization for [place] is required.  PA submitted on CoverMyMeds Key [id number]  Status is pending  Oral Oncology Clinic will continue to follow.  [place] Patient [place] Phone [phone] Fax [phone] 03/13/2020 1:14 PM

## 2020-03-13 NOTE — Telephone Encounter (Signed)
Oral Oncology Patient Advocate Encounter  Prior Authorization for Frankey Poot has been approved.    PA# [id number] Effective dates: 01/04/20 through 01/02/21  Patients co-pay is $2937.58.  Oral Oncology Clinic will continue to follow.   [place] Patient [place] Phone [phone] Fax [phone] 03/13/2020 1:36 PM

## 2020-03-13 NOTE — Telephone Encounter (Signed)
Oral Oncology Pharmacist Encounter  Received new prescription for Lonsurf (trifluridine-tipiracil) for the treatment of metastatic colon cancer in conjunction with bevacizumab, planned duration until disease progression or unacceptable drug toxicity.  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 He is finishing up treatment with rituximab.  CMP from 03/12/20 assessed, no relevant lab abnormalities. Prescription dose and frequency assessed.   Current medication list in Epic reviewed, no DDIs with Lonsurf identified.  Evaluated chart and no patient barriers to medication adherence identified.   Prescription has been e-scribed to the [facility] for benefits analysis and approval.  Oral Oncology Clinic will continue to follow for insurance authorization, copayment issues, initial counseling and start date.  Patient agreed to treatment on 03/12/20 per MD documentation.  Darl Pikes, PharmD, BCPS, BCOP, CPP Hematology/Oncology Clinical Pharmacist Practitioner [facility] [phone]  03/13/2020 11:37 AM

## 2020-03-16 ENCOUNTER — Telehealth: Payer: Self-pay | Admitting: Pharmacy Technician

## 2020-03-16 NOTE — Telephone Encounter (Signed)
Received signed patient application by fax.  Confirmed with Mrs Cephas they were received. Once MD portion of forms are completed I will fax to [facility].  [place] Patient [place] Phone [phone] Fax [phone] 03/16/2020 12:28 PM

## 2020-03-16 NOTE — Telephone Encounter (Signed)
Oral Oncology Patient Advocate Encounter  Spoke to Zachary Willis on Friday 03/13/20 about copay for Lonsurf and applying for patient assistance.  I emailed him the [place] application and he and his wife will complete and get back to me.   [place] Patient [place] Phone [phone] Fax [phone] 03/16/2020 9:17 AM

## 2020-03-18 NOTE — Telephone Encounter (Signed)
Oral Oncology Patient Advocate Encounter  Application completed and faxed to [phone] on 03/17/20.   [employer] patient assistance phone number for follow up is [phone].   This encounter will be updated until final determination.   [place] Patient [place] Phone [phone] Fax [phone] 03/18/2020 3:31 PM

## 2020-03-19 ENCOUNTER — Other Ambulatory Visit: Payer: Self-pay

## 2020-03-19 ENCOUNTER — Inpatient Hospital Stay: Payer: Medicare HMO

## 2020-03-19 ENCOUNTER — Telehealth: Payer: Self-pay | Admitting: Pharmacy Technician

## 2020-03-19 VITALS — BP 158/78 | HR 62 | Temp 98.6°F | Resp 19

## 2020-03-19 DIAGNOSIS — C787 Secondary malignant neoplasm of liver and intrahepatic bile duct: Secondary | ICD-10-CM

## 2020-03-19 DIAGNOSIS — Z5112 Encounter for antineoplastic immunotherapy: Secondary | ICD-10-CM | POA: Diagnosis not present

## 2020-03-19 DIAGNOSIS — I10 Essential (primary) hypertension: Secondary | ICD-10-CM

## 2020-03-19 DIAGNOSIS — C189 Malignant neoplasm of colon, unspecified: Secondary | ICD-10-CM

## 2020-03-19 LAB — CMP (CANCER CENTER ONLY)
ALT: 49 U/L — ABNORMAL HIGH (ref 0–44)
AST: 45 U/L — ABNORMAL HIGH (ref 15–41)
Albumin: 3.5 g/dL (ref 3.5–5.0)
Alkaline Phosphatase: 227 U/L — ABNORMAL HIGH (ref 38–126)
Anion gap: 4 — ABNORMAL LOW (ref 5–15)
BUN: 23 mg/dL (ref 8–23)
CO2: 34 mmol/L — ABNORMAL HIGH (ref 22–32)
Calcium: 9.6 mg/dL (ref 8.9–10.3)
Chloride: 105 mmol/L (ref 98–111)
Creatinine: 1.09 mg/dL (ref 0.61–1.24)
GFR, Estimated: >60 mL/min (ref >60)
Glucose, Bld: 119 mg/dL — ABNORMAL HIGH (ref 70–99)
Potassium: 4.7 mmol/L (ref 3.5–5.1)
Sodium: 143 mmol/L (ref 135–145)
Total Bilirubin: 1.6 mg/dL — ABNORMAL HIGH (ref 0.3–1.2)
Total Protein: 5.9 g/dL — ABNORMAL LOW (ref 6.5–8.1)

## 2020-03-19 LAB — CBC WITH DIFFERENTIAL (CANCER CENTER ONLY)
Abs Immature Granulocytes: 0.17 10*3/uL — ABNORMAL HIGH (ref 0.00–0.07)
Basophils Absolute: 0.1 10*3/uL (ref 0.0–0.1)
Basophils Relative: 1 %
Eosinophils Absolute: 0.3 10*3/uL (ref 0.0–0.5)
Eosinophils Relative: 4 %
HCT: 47.8 % (ref 39.0–52.0)
Hemoglobin: 15.4 g/dL (ref 13.0–17.0)
Immature Granulocytes: 2 %
Lymphocytes Relative: 9 %
Lymphs Abs: 0.7 10*3/uL (ref 0.7–4.0)
MCH: 30.9 pg (ref 26.0–34.0)
MCHC: 32.2 g/dL (ref 30.0–36.0)
MCV: 95.8 fL (ref 80.0–100.0)
Monocytes Absolute: 0.6 10*3/uL (ref 0.1–1.0)
Monocytes Relative: 8 %
Neutro Abs: 5.8 10*3/uL (ref 1.7–7.7)
Neutrophils Relative %: 76 %
Platelet Count: 103 10*3/uL — ABNORMAL LOW (ref 150–400)
RBC: 4.99 MIL/uL (ref 4.22–5.81)
RDW: 14.1 % (ref 11.5–15.5)
WBC Count: 7.7 10*3/uL (ref 4.0–10.5)
nRBC: 0.4 % — ABNORMAL HIGH (ref 0.0–0.2)

## 2020-03-19 LAB — PROTIME-INR
INR: 2.9 — ABNORMAL HIGH (ref 0.8–1.2)
Prothrombin Time: 29.5 seconds — ABNORMAL HIGH (ref 11.4–15.2)

## 2020-03-19 MED ORDER — CLONIDINE HCL 0.1 MG PO TABS
ORAL_TABLET | ORAL | Status: AC
  Filled 2020-03-19: qty 2

## 2020-03-19 MED ORDER — SODIUM CHLORIDE 0.9 % IV SOLN
Freq: Once | INTRAVENOUS | Status: AC
  Filled 2020-03-19: qty 250

## 2020-03-19 MED ORDER — HEPARIN SOD (PORK) LOCK FLUSH 100 UNIT/ML IV SOLN
500.0000 [IU] | Freq: Once | INTRAVENOUS | Status: AC | PRN
  Administered 2020-03-19: 500 [IU]
  Filled 2020-03-19: qty 5

## 2020-03-19 MED ORDER — CLONIDINE HCL 0.1 MG PO TABS
0.2000 mg | ORAL_TABLET | Freq: Once | ORAL | Status: AC
  Administered 2020-03-19: 0.2 mg via ORAL

## 2020-03-19 MED ORDER — SODIUM CHLORIDE 0.9% FLUSH
10.0000 mL | INTRAVENOUS | Status: DC | PRN
  Administered 2020-03-19: 10 mL
  Filled 2020-03-19: qty 10

## 2020-03-19 MED ORDER — SODIUM CHLORIDE 0.9 % IV SOLN
7.5000 mg/kg | Freq: Once | INTRAVENOUS | Status: AC
  Administered 2020-03-19: 800 mg via INTRAVENOUS
  Filled 2020-03-19: qty 32

## 2020-03-19 NOTE — Progress Notes (Signed)
Per Judson Roch, NP pt to decrease Coumadin to 1.25mg  (1/2 tab) on MWF & 2.5mg  on TThSaSu. Pt aware and verbalizes understanding using teach back. Written instructions given to pt as well. dph

## 2020-03-19 NOTE — Patient Instructions (Signed)
Bevacizumab injection What is this medicine? BEVACIZUMAB (be va SIZ yoo mab) is a monoclonal antibody. It is used to treat many types of cancer. This medicine may be used for other purposes; ask your health care provider or pharmacist if you have questions. COMMON BRAND NAME(S): Avastin, MVASI, Zirabev What should I tell my health care provider before I take this medicine? They need to know if you have any of these conditions:  diabetes  heart disease  high blood pressure  history of coughing up blood  prior anthracycline chemotherapy (e.g., doxorubicin, daunorubicin, epirubicin)  recent or ongoing radiation therapy  recent or planning to have surgery  stroke  an unusual or allergic reaction to bevacizumab, hamster proteins, mouse proteins, other medicines, foods, dyes, or preservatives  pregnant or trying to get pregnant  breast-feeding How should I use this medicine? This medicine is for infusion into a vein. It is given by a health care professional in a hospital or clinic setting. Talk to your pediatrician regarding the use of this medicine in children. Special care may be needed. Overdosage: If you think you have taken too much of this medicine contact a poison control center or emergency room at once. NOTE: This medicine is only for you. Do not share this medicine with others. What if I miss a dose? It is important not to miss your dose. Call your doctor or health care professional if you are unable to keep an appointment. What may interact with this medicine? Interactions are not expected. This list may not describe all possible interactions. Give your health care provider a list of all the medicines, herbs, non-prescription drugs, or dietary supplements you use. Also tell them if you smoke, drink alcohol, or use illegal drugs. Some items may interact with your medicine. What should I watch for while using this medicine? Your condition will be monitored carefully while  you are receiving this medicine. You will need important blood work and urine testing done while you are taking this medicine. This medicine may increase your risk to bruise or bleed. Call your doctor or health care professional if you notice any unusual bleeding. Before having surgery, talk to your health care provider to make sure it is ok. This drug can increase the risk of poor healing of your surgical site or wound. You will need to stop this drug for 28 days before surgery. After surgery, wait at least 28 days before restarting this drug. Make sure the surgical site or wound is healed enough before restarting this drug. Talk to your health care provider if questions. Do not become pregnant while taking this medicine or for 6 months after stopping it. Women should inform their doctor if they wish to become pregnant or think they might be pregnant. There is a potential for serious side effects to an unborn child. Talk to your health care professional or pharmacist for more information. Do not breast-feed an infant while taking this medicine and for 6 months after the last dose. This medicine has caused ovarian failure in some women. This medicine may interfere with the ability to have a child. You should talk to your doctor or health care professional if you are concerned about your fertility. What side effects may I notice from receiving this medicine? Side effects that you should report to your doctor or health care professional as soon as possible:  allergic reactions like skin rash, itching or hives, swelling of the face, lips, or tongue  chest pain or chest tightness    chills  coughing up blood  high fever  seizures  severe constipation  signs and symptoms of bleeding such as bloody or black, tarry stools; red or dark-brown urine; spitting up blood or brown material that looks like coffee grounds; red spots on the skin; unusual bruising or bleeding from the eye, gums, or nose  signs  and symptoms of a blood clot such as breathing problems; chest pain; severe, sudden headache; pain, swelling, warmth in the leg  signs and symptoms of a stroke like changes in vision; confusion; trouble speaking or understanding; severe headaches; sudden numbness or weakness of the face, arm or leg; trouble walking; dizziness; loss of balance or coordination  stomach pain  sweating  swelling of legs or ankles  vomiting  weight gain Side effects that usually do not require medical attention (report to your doctor or health care professional if they continue or are bothersome):  back pain  changes in taste  decreased appetite  dry skin  nausea  tiredness This list may not describe all possible side effects. Call your doctor for medical advice about side effects. You may report side effects to FDA at 1-800-FDA-1088. Where should I keep my medicine? This drug is given in a hospital or clinic and will not be stored at home. NOTE: This sheet is a summary. It may not cover all possible information. If you have questions about this medicine, talk to your doctor, pharmacist, or health care provider.  2021 Elsevier/Gold Standard (2018-10-17 10:50:46)  

## 2020-03-19 NOTE — Telephone Encounter (Signed)
[employer] rep called about application.  Fatima S[place] funding opened up today through [employer].  I applied for the patient and he was approved for a $6,000 grant to help with the cost of Lonsurf.  Application will be put on hold for now.  Once patient exhausts his grant funding then we can have his application reopened.  [place] Patient [place] Phone [phone] Fax [phone] 03/19/2020 4:21 PM

## 2020-03-19 NOTE — Telephone Encounter (Signed)
Oral Oncology Patient Advocate Encounter  Was successful in securing patient a $ 6,000 grant from [employer] to provide copayment coverage for Lonsurf.  The patient's out of pocket cost will be $0.00 monthly.    I have spoken with the patient.    The billing information is as follows and has been shared with Kila.   Member ID: [id number] Group ID: [id number] RxBin: [id number] Dates of Eligibility: 03/19/20 through 01/02/21  Fund: Metastatic Colorectal Zachary Willis Phone [phone] Fax [phone] 03/19/2020 4:18 PM

## 2020-03-20 ENCOUNTER — Telehealth: Payer: Self-pay | Admitting: *Deleted

## 2020-03-20 NOTE — Telephone Encounter (Signed)
Patient notified per order of Dr. Marin Olp that "the INR is 2.9. Do not change the dose of Coumadin. Pete"  Pt instructed to cancel the instructions given to him yesterday from S. [place] NP and to continue his Coumadin as he has been taking per order of Dr. Marin Olp.  Pt states that he is currently taking Coumadin 4 mg daily.

## 2020-03-20 NOTE — Telephone Encounter (Signed)
Oral Chemotherapy Pharmacist Encounter  Patient Education I spoke with patient and his wife Meredith Mody for overview of new oral chemotherapy medication: Lonsurf (trifluridine-tipiracil) for the treatment of metastatic colon cancer in conjunction with bevacizumab, planned duration until disease progression or unacceptable drug toxicity.   Counseled patient on administration, dosing, side effects, monitoring, drug-food interactions, safe handling, storage, and disposal. Patient will take 5 tablets (75 mg of trifluridine total) by mouth 2 (two) times daily after a meal. 1 hr after AM & PM meals on days 1-5, 8-12. Repeat every 28day.  Side effects include but not limited to: diarrhea, N/V, fatigue, decreased wbc.    Bowel movement currently normal, he has had trouble in the diarrhea in the past and he is familiar with loperamide for diarrhea management.  He reports having ondansetron at home for nausea management. He has not had issues with nausea with his past chemotherapy regimens.   Reviewed with patient importance of keeping a medication schedule and plan for any missed doses.  After discussion with patient no patient barriers to medication adherence identified. [name] helps to care for him.  Mr. And Mrs. [name] voiced understanding and appreciation. All questions answered. Medication handout and calendarprovided.  Provided patient with [facility] phone number. Patient knows to call the office with questions or concerns. Oral Chemotherapy Navigation Clinic will continue to follow.  Darl Pikes, PharmD, BCPS, BCOP, CPP Hematology/Oncology Clinical Pharmacist Practitioner [facility] [phone]  03/20/2020 12:31 PM

## 2020-03-20 NOTE — Telephone Encounter (Signed)
-----   Message from Zachary Napoleon, MD sent at 03/20/2020  1:19 PM EDT ----- Call - the INR is 2.9.  do not change the does of coumadin.  [name]

## 2020-03-23 ENCOUNTER — Other Ambulatory Visit: Payer: Self-pay | Admitting: *Deleted

## 2020-03-23 MED ORDER — WARFARIN SODIUM 4 MG PO TABS: 4.0000 mg | ORAL_TABLET | Freq: Every day | ORAL | 2 refills | Status: DC

## 2020-03-26 ENCOUNTER — Inpatient Hospital Stay: Payer: Medicare HMO

## 2020-03-26 ENCOUNTER — Other Ambulatory Visit: Payer: Self-pay

## 2020-03-26 DIAGNOSIS — C189 Malignant neoplasm of colon, unspecified: Secondary | ICD-10-CM

## 2020-03-26 DIAGNOSIS — C787 Secondary malignant neoplasm of liver and intrahepatic bile duct: Secondary | ICD-10-CM

## 2020-03-26 DIAGNOSIS — Z5112 Encounter for antineoplastic immunotherapy: Secondary | ICD-10-CM | POA: Diagnosis not present

## 2020-03-26 LAB — CBC WITH DIFFERENTIAL (CANCER CENTER ONLY)
Abs Immature Granulocytes: 0.08 10*3/uL — ABNORMAL HIGH (ref 0.00–0.07)
Basophils Absolute: 0 10*3/uL (ref 0.0–0.1)
Basophils Relative: 1 %
Eosinophils Absolute: 0.2 10*3/uL (ref 0.0–0.5)
Eosinophils Relative: 3 %
HCT: 43.9 % (ref 39.0–52.0)
Hemoglobin: 14 g/dL (ref 13.0–17.0)
Immature Granulocytes: 1 %
Lymphocytes Relative: 8 %
Lymphs Abs: 0.5 10*3/uL — ABNORMAL LOW (ref 0.7–4.0)
MCH: 30.7 pg (ref 26.0–34.0)
MCHC: 31.9 g/dL (ref 30.0–36.0)
MCV: 96.3 fL (ref 80.0–100.0)
Monocytes Absolute: 0.4 10*3/uL (ref 0.1–1.0)
Monocytes Relative: 7 %
Neutro Abs: 5 10*3/uL (ref 1.7–7.7)
Neutrophils Relative %: 80 %
Platelet Count: 80 10*3/uL — ABNORMAL LOW (ref 150–400)
RBC: 4.56 MIL/uL (ref 4.22–5.81)
RDW: 13.7 % (ref 11.5–15.5)
WBC Count: 6.2 10*3/uL (ref 4.0–10.5)
nRBC: 0 % (ref 0.0–0.2)

## 2020-03-26 LAB — CMP (CANCER CENTER ONLY)
ALT: 57 U/L — ABNORMAL HIGH (ref 0–44)
AST: 57 U/L — ABNORMAL HIGH (ref 15–41)
Albumin: 3.2 g/dL — ABNORMAL LOW (ref 3.5–5.0)
Alkaline Phosphatase: 259 U/L — ABNORMAL HIGH (ref 38–126)
Anion gap: 6 (ref 5–15)
BUN: 19 mg/dL (ref 8–23)
CO2: 32 mmol/L (ref 22–32)
Calcium: 9.2 mg/dL (ref 8.9–10.3)
Chloride: 106 mmol/L (ref 98–111)
Creatinine: 1.11 mg/dL (ref 0.61–1.24)
GFR, Estimated: >60 mL/min (ref >60)
Glucose, Bld: 209 mg/dL — ABNORMAL HIGH (ref 70–99)
Potassium: 4.7 mmol/L (ref 3.5–5.1)
Sodium: 144 mmol/L (ref 135–145)
Total Bilirubin: 1.3 mg/dL — ABNORMAL HIGH (ref 0.3–1.2)
Total Protein: 5.9 g/dL — ABNORMAL LOW (ref 6.5–8.1)

## 2020-03-26 LAB — PROTIME-INR
INR: 1.9 — ABNORMAL HIGH (ref 0.8–1.2)
Prothrombin Time: 21.3 seconds — ABNORMAL HIGH (ref 11.4–15.2)

## 2020-03-31 ENCOUNTER — Other Ambulatory Visit (HOSPITAL_COMMUNITY): Payer: Self-pay

## 2020-04-01 ENCOUNTER — Inpatient Hospital Stay: Payer: Medicare HMO

## 2020-04-01 ENCOUNTER — Inpatient Hospital Stay (HOSPITAL_BASED_OUTPATIENT_CLINIC_OR_DEPARTMENT_OTHER): Payer: Medicare HMO | Admitting: Hematology & Oncology

## 2020-04-01 ENCOUNTER — Other Ambulatory Visit: Payer: Self-pay

## 2020-04-01 ENCOUNTER — Encounter: Payer: Self-pay | Admitting: Hematology & Oncology

## 2020-04-01 VITALS — BP 129/63 | HR 66 | Temp 98.6°F | Resp 20 | Wt 244.0 lb

## 2020-04-01 DIAGNOSIS — D72820 Lymphocytosis (symptomatic): Secondary | ICD-10-CM | POA: Diagnosis not present

## 2020-04-01 DIAGNOSIS — I480 Paroxysmal atrial fibrillation: Secondary | ICD-10-CM

## 2020-04-01 DIAGNOSIS — C787 Secondary malignant neoplasm of liver and intrahepatic bile duct: Secondary | ICD-10-CM

## 2020-04-01 DIAGNOSIS — C189 Malignant neoplasm of colon, unspecified: Secondary | ICD-10-CM

## 2020-04-01 DIAGNOSIS — D693 Immune thrombocytopenic purpura: Secondary | ICD-10-CM | POA: Diagnosis not present

## 2020-04-01 DIAGNOSIS — Z5112 Encounter for antineoplastic immunotherapy: Secondary | ICD-10-CM | POA: Diagnosis not present

## 2020-04-01 LAB — CBC WITH DIFFERENTIAL (CANCER CENTER ONLY)
Abs Immature Granulocytes: 0.1 10*3/uL — ABNORMAL HIGH (ref 0.00–0.07)
Basophils Absolute: 0 10*3/uL (ref 0.0–0.1)
Basophils Relative: 1 %
Eosinophils Absolute: 0.1 10*3/uL (ref 0.0–0.5)
Eosinophils Relative: 3 %
HCT: 44.1 % (ref 39.0–52.0)
Hemoglobin: 14.4 g/dL (ref 13.0–17.0)
Immature Granulocytes: 2 %
Lymphocytes Relative: 9 %
Lymphs Abs: 0.5 10*3/uL — ABNORMAL LOW (ref 0.7–4.0)
MCH: 31.2 pg (ref 26.0–34.0)
MCHC: 32.7 g/dL (ref 30.0–36.0)
MCV: 95.5 fL (ref 80.0–100.0)
Monocytes Absolute: 0.2 10*3/uL (ref 0.1–1.0)
Monocytes Relative: 4 %
Neutro Abs: 4.7 10*3/uL (ref 1.7–7.7)
Neutrophils Relative %: 81 %
Platelet Count: 96 10*3/uL — ABNORMAL LOW (ref 150–400)
RBC: 4.62 MIL/uL (ref 4.22–5.81)
RDW: 13.7 % (ref 11.5–15.5)
WBC Count: 5.7 10*3/uL (ref 4.0–10.5)
nRBC: 0.5 % — ABNORMAL HIGH (ref 0.0–0.2)

## 2020-04-01 LAB — CMP (CANCER CENTER ONLY)
ALT: 52 U/L — ABNORMAL HIGH (ref 0–44)
AST: 54 U/L — ABNORMAL HIGH (ref 15–41)
Albumin: 3.4 g/dL — ABNORMAL LOW (ref 3.5–5.0)
Alkaline Phosphatase: 213 U/L — ABNORMAL HIGH (ref 38–126)
Anion gap: 5 (ref 5–15)
BUN: 19 mg/dL (ref 8–23)
CO2: 34 mmol/L — ABNORMAL HIGH (ref 22–32)
Calcium: 9.1 mg/dL (ref 8.9–10.3)
Chloride: 103 mmol/L (ref 98–111)
Creatinine: 1.05 mg/dL (ref 0.61–1.24)
GFR, Estimated: >60 mL/min (ref >60)
Glucose, Bld: 186 mg/dL — ABNORMAL HIGH (ref 70–99)
Potassium: 4.1 mmol/L (ref 3.5–5.1)
Sodium: 142 mmol/L (ref 135–145)
Total Bilirubin: 1.8 mg/dL — ABNORMAL HIGH (ref 0.3–1.2)
Total Protein: 6.1 g/dL — ABNORMAL LOW (ref 6.5–8.1)

## 2020-04-01 LAB — PROTIME-INR
INR: 2.6 — ABNORMAL HIGH (ref 0.8–1.2)
Prothrombin Time: 26.8 seconds — ABNORMAL HIGH (ref 11.4–15.2)

## 2020-04-01 NOTE — Progress Notes (Signed)
Hematology and Oncology Follow Up Visit  Zachary Willis [id number] 09-24-1945 75 y.o. 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  He really looks fantastic.  He started the Lonsurf a week or so ago.  He has tolerated this quite nicely.  His blood pressure is doing quite well right now.  He has had no problems with abdominal pain.  There is no diarrhea.  He has had no bleeding.  His INR today is 2.6.  He is on Coumadin at 4 mg a day.  He has had no problems with cough or shortness of breath.  There has been no headaches.  He has had no problems with his appetite.  He is eating fairly well.  Overall, I would have to say his performance status is ECOG 1.     Medications:  Allergies as of 04/01/2020   No Known Allergies     Medication List       Accurate as of April 01, 2020  9:12 AM. If you have any questions, ask your nurse or doctor.        STOP taking these medications   capecitabine 500 MG tablet Commonly known as: XELODA Stopped by: [name], MD     TAKE these medications   atorvastatin 40 MG tablet Commonly known as: LIPITOR Take 40 mg by mouth daily.   carvedilol 6.25 MG tablet Commonly known as: COREG Take 6.25 mg by mouth 2 (two) times daily with a meal.   ferrous sulfate 325 (65 FE) MG tablet Take 325 mg by mouth daily.   furosemide 40 MG tablet Commonly known as: LASIX Take 20 mg by mouth every other day.   hydrocortisone 10 MG tablet Commonly known as:  CORTEF Take 1-2 tablets (10-20 mg total) by mouth See admin instructions. 20mg  in AM 10mg  in PM   ibuprofen 200 MG tablet Commonly known as: ADVIL Take 400 mg by mouth every 6 (six) hours as needed for headache or mild pain.   levothyroxine 88 MCG tablet Commonly known as: SYNTHROID Take 88 mcg by mouth daily before breakfast.   lidocaine-prilocaine cream Commonly known as: EMLA Apply to affected area once   Lonsurf 15-6.14 MG tablet Generic drug: trifluridine-tipiracil Take 5 tablets (75 mg of trifluridine total) by mouth 2 (two) times daily after a meal. 1 hr after AM & PM meals on days 1-5, 8-12. Repeat every 28day   NASAL [place] NA Place 1 spray into the nose daily.   ondansetron 8 MG tablet Commonly known as: Zofran Take 1 tablet (8 mg total) by mouth every 4 (four) hours as needed for nausea.   Ozempic (0.25 or 0.5 MG/DOSE) 2 MG/1.5ML Sopn Generic drug: Semaglutide(0.25 or 0.5MG /DOS) Inject into the skin.   pantoprazole 40 MG tablet Commonly known as: PROTONIX Take 40 mg by mouth every morning.   polyvinyl alcohol 1.4 % ophthalmic solution Commonly known as: LIQUIFILM TEARS Place 1 drop into both eyes daily as needed for dry eyes.   potassium chloride 10 MEQ tablet Commonly  known as: KLOR-CON Take 40 mEq by mouth daily.   telmisartan 80 MG tablet Commonly known as: MICARDIS Take 1 tablet (80 mg total) by mouth daily.   testosterone 4 MG/24HR Pt24 patch Commonly known as: ANDRODERM Place 1 patch onto the skin daily.   warfarin 4 MG tablet Commonly known as: Coumadin Take 1 tablet (4 mg total) by mouth daily.       Allergies: No Known Allergies  Past Medical History, Surgical history, Social history, and Family History were reviewed and updated.  Review of Systems: Review of Systems  Constitutional: Negative.   HENT: Negative.   Eyes: Negative.   Respiratory: Negative.   Cardiovascular: Negative.   Gastrointestinal: Negative.   Genitourinary:  Negative.   Musculoskeletal: Negative.   Skin: Negative.   Neurological: Negative.   Endo/Heme/Allergies: Negative.   Psychiatric/Behavioral: Negative.       Physical Exam:  weight is 244 lb (110.7 kg). His oral temperature is 98.6 F (37 C). His blood pressure is 129/63 and his pulse is 66. His respiration is 20 and oxygen saturation is 100%.   Wt Readings from Last 3 Encounters:  04/01/20 244 lb (110.7 kg)  03/12/20 243 lb (110.2 kg)  03/04/20 244 lb 1.9 oz (110.7 kg)    Physical Exam Vitals reviewed.  HENT:     Head: Normocephalic and atraumatic.  Eyes:     Pupils: Pupils are equal, round, and reactive to light.  Cardiovascular:     Rate and Rhythm: Normal rate and regular rhythm.     Heart sounds: Normal heart sounds.     Comments: Cardiac exam shows a regular rate and rhythm.  There is some occasional extra beat.  I do not hear any obvious atrial fibrillation. Pulmonary:     Effort: Pulmonary effort is normal.     Breath sounds: Normal breath sounds.  Abdominal:     General: Bowel sounds are normal.     Palpations: Abdomen is soft.     Comments: Abdominal exam shows laparotomy scars.  He has no fluid wave.  He has no guarding or rebound tenderness.  There is no palpable liver or spleen tip.  Musculoskeletal:        General: No tenderness or deformity. Normal range of motion.     Cervical back: Normal range of motion.  Lymphadenopathy:     Cervical: No cervical adenopathy.  Skin:    General: Skin is warm and dry.     Findings: No erythema or rash.  Neurological:     Mental Status: He is alert and oriented to person, place, and time.  Psychiatric:        Behavior: Behavior normal.        Thought Content: Thought content normal.        Judgment: Judgment normal.    Lab Results  Component Value Date   WBC 5.7 04/01/2020   HGB 14.4 04/01/2020   HCT 44.1 04/01/2020   MCV 95.5 04/01/2020   PLT 96 (L) 04/01/2020   Lab Results  Component Value Date   FERRITIN  153 01/16/2020   IRON 83 01/16/2020   TIBC 274 01/16/2020   UIBC 190 01/16/2020   IRONPCTSAT 30 01/16/2020   Lab Results  Component Value Date   RBC 4.62 04/01/2020   No results found for: KPAFRELGTCHN, LAMBDASER, KAPLAMBRATIO No results found for: IGGSERUM, IGA, IGMSERUM No results found for: TOTALPROTELP, ALBUMINELP, A1GS, A2GS, BETS, BETA2SER, GAMS, MSPIKE, SPEI   Chemistry      Component  Value Date/Time   NA 142 04/01/2020 0823   K 4.1 04/01/2020 0823   CL 103 04/01/2020 0823   CO2 34 (H) 04/01/2020 0823   BUN 19 04/01/2020 0823   CREATININE 1.05 04/01/2020 0823      Component Value Date/Time   CALCIUM 9.1 04/01/2020 0823   ALKPHOS 213 (H) 04/01/2020 0823   AST 54 (H) 04/01/2020 0823   ALT 52 (H) 04/01/2020 0823   BILITOT 1.8 (H) 04/01/2020 0823      Impression and Plan: Mr. Mirsky is a very pleasant 75 yo caucasian gentleman with metastatic colon cancer.  He now is on Lonsurf/Avastin combination.  He is on his first cycle of Lonsurf.  He has a couple more days to go before he finishes his first cycle.  His liver tests are little bit better.  Hopefully, the CEA will be down a little bit.  I am glad that his platelet count is better.  We gave him Rituxan for the platelet count.  He actually has a low-grade lymphoma in his bone marrow which had caused the thrombocytopenia.  It is still too early for [place] to do any scans on him.  We will plan to get him back to see [place] in another month.  I will do some scans in May.  His CEA clearly will show [place] what is going on.   [name], MD 3/30/20229:12 AM

## 2020-04-02 ENCOUNTER — Telehealth: Payer: Self-pay

## 2020-04-02 ENCOUNTER — Other Ambulatory Visit: Payer: Self-pay | Admitting: Hematology & Oncology

## 2020-04-02 NOTE — Telephone Encounter (Signed)
S/w pts wife and she is aware of all appts per los

## 2020-04-09 ENCOUNTER — Inpatient Hospital Stay (HOSPITAL_BASED_OUTPATIENT_CLINIC_OR_DEPARTMENT_OTHER): Payer: Medicare HMO | Admitting: Hematology & Oncology

## 2020-04-09 ENCOUNTER — Inpatient Hospital Stay: Payer: Medicare HMO | Attending: Hematology & Oncology

## 2020-04-09 ENCOUNTER — Inpatient Hospital Stay: Payer: Medicare HMO

## 2020-04-09 ENCOUNTER — Encounter: Payer: Self-pay | Admitting: Hematology & Oncology

## 2020-04-09 ENCOUNTER — Other Ambulatory Visit: Payer: Self-pay

## 2020-04-09 ENCOUNTER — Other Ambulatory Visit: Payer: Self-pay | Admitting: *Deleted

## 2020-04-09 VITALS — BP 130/75 | HR 54 | Temp 99.1°F | Resp 18 | Wt 245.0 lb

## 2020-04-09 DIAGNOSIS — I480 Paroxysmal atrial fibrillation: Secondary | ICD-10-CM

## 2020-04-09 DIAGNOSIS — C787 Secondary malignant neoplasm of liver and intrahepatic bile duct: Secondary | ICD-10-CM | POA: Diagnosis not present

## 2020-04-09 DIAGNOSIS — Z79899 Other long term (current) drug therapy: Secondary | ICD-10-CM | POA: Insufficient documentation

## 2020-04-09 DIAGNOSIS — Z5112 Encounter for antineoplastic immunotherapy: Secondary | ICD-10-CM | POA: Diagnosis not present

## 2020-04-09 DIAGNOSIS — C189 Malignant neoplasm of colon, unspecified: Secondary | ICD-10-CM

## 2020-04-09 DIAGNOSIS — D693 Immune thrombocytopenic purpura: Secondary | ICD-10-CM

## 2020-04-09 LAB — CMP (CANCER CENTER ONLY)
ALT: 55 U/L — ABNORMAL HIGH (ref 0–44)
AST: 56 U/L — ABNORMAL HIGH (ref 15–41)
Albumin: 3.3 g/dL — ABNORMAL LOW (ref 3.5–5.0)
Alkaline Phosphatase: 218 U/L — ABNORMAL HIGH (ref 38–126)
Anion gap: 7 (ref 5–15)
BUN: 19 mg/dL (ref 8–23)
CO2: 32 mmol/L (ref 22–32)
Calcium: 9.3 mg/dL (ref 8.9–10.3)
Chloride: 108 mmol/L (ref 98–111)
Creatinine: 1.1 mg/dL (ref 0.61–1.24)
GFR, Estimated: >60 mL/min (ref >60)
Glucose, Bld: 104 mg/dL — ABNORMAL HIGH (ref 70–99)
Potassium: 3.8 mmol/L (ref 3.5–5.1)
Sodium: 147 mmol/L — ABNORMAL HIGH (ref 135–145)
Total Bilirubin: 2 mg/dL — ABNORMAL HIGH (ref 0.3–1.2)
Total Protein: 5.9 g/dL — ABNORMAL LOW (ref 6.5–8.1)

## 2020-04-09 LAB — IRON AND TIBC
Iron: 76 ug/dL (ref 42–163)
Saturation Ratios: 37 % (ref 20–55)
TIBC: 209 ug/dL (ref 202–409)
UIBC: 132 ug/dL (ref 117–376)

## 2020-04-09 LAB — CBC WITH DIFFERENTIAL (CANCER CENTER ONLY)
Abs Immature Granulocytes: 0.01 10*3/uL (ref 0.00–0.07)
Basophils Absolute: 0 10*3/uL (ref 0.0–0.1)
Basophils Relative: 1 %
Eosinophils Absolute: 0.1 10*3/uL (ref 0.0–0.5)
Eosinophils Relative: 3 %
HCT: 38.6 % — ABNORMAL LOW (ref 39.0–52.0)
Hemoglobin: 12.7 g/dL — ABNORMAL LOW (ref 13.0–17.0)
Immature Granulocytes: 1 %
Lymphocytes Relative: 28 %
Lymphs Abs: 0.5 10*3/uL — ABNORMAL LOW (ref 0.7–4.0)
MCH: 30.9 pg (ref 26.0–34.0)
MCHC: 32.9 g/dL (ref 30.0–36.0)
MCV: 93.9 fL (ref 80.0–100.0)
Monocytes Absolute: 0.1 10*3/uL (ref 0.1–1.0)
Monocytes Relative: 7 %
Neutro Abs: 1.2 10*3/uL — ABNORMAL LOW (ref 1.7–7.7)
Neutrophils Relative %: 60 %
Platelet Count: 64 10*3/uL — ABNORMAL LOW (ref 150–400)
RBC: 4.11 MIL/uL — ABNORMAL LOW (ref 4.22–5.81)
RDW: 13.8 % (ref 11.5–15.5)
WBC Count: 1.9 10*3/uL — ABNORMAL LOW (ref 4.0–10.5)
nRBC: 3.6 % — ABNORMAL HIGH (ref 0.0–0.2)

## 2020-04-09 LAB — PLATELET BY CITRATE

## 2020-04-09 LAB — RETICULOCYTES
Immature Retic Fract: 44.5 % — ABNORMAL HIGH (ref 2.3–15.9)
RBC.: 4.09 MIL/uL — ABNORMAL LOW (ref 4.22–5.81)
Retic Count, Absolute: 51.9 10*3/uL (ref 19.0–186.0)
Retic Ct Pct: 1.3 % (ref 0.4–3.1)

## 2020-04-09 LAB — PROTIME-INR
INR: 2.9 — ABNORMAL HIGH (ref 0.8–1.2)
Prothrombin Time: 29.6 seconds — ABNORMAL HIGH (ref 11.4–15.2)

## 2020-04-09 LAB — CEA (IN HOUSE-CHCC): CEA (CHCC-In House): 811.15 ng/mL — ABNORMAL HIGH (ref 0.00–5.00)

## 2020-04-09 LAB — FERRITIN: Ferritin: 239 ng/mL (ref 24–336)

## 2020-04-09 LAB — SAVE SMEAR(SSMR), FOR PROVIDER SLIDE REVIEW

## 2020-04-09 MED ORDER — SODIUM CHLORIDE 0.9 % IV SOLN
Freq: Once | INTRAVENOUS | Status: AC
  Filled 2020-04-09: qty 250

## 2020-04-09 MED ORDER — SODIUM CHLORIDE 0.9 % IV SOLN
7.5000 mg/kg | Freq: Once | INTRAVENOUS | Status: AC
  Administered 2020-04-09: 800 mg via INTRAVENOUS
  Filled 2020-04-09: qty 32

## 2020-04-09 MED ORDER — SODIUM CHLORIDE 0.9% FLUSH
10.0000 mL | INTRAVENOUS | Status: DC | PRN
  Administered 2020-04-09: 10 mL
  Filled 2020-04-09: qty 10

## 2020-04-09 MED ORDER — HEPARIN SOD (PORK) LOCK FLUSH 100 UNIT/ML IV SOLN
500.0000 [IU] | Freq: Once | INTRAVENOUS | Status: AC | PRN
  Administered 2020-04-09: 500 [IU]
  Filled 2020-04-09: qty 5

## 2020-04-09 NOTE — Patient Instructions (Signed)
Bevacizumab injection What is this medicine? BEVACIZUMAB (be va SIZ yoo mab) is a monoclonal antibody. It is used to treat many types of cancer. This medicine may be used for other purposes; ask your health care provider or pharmacist if you have questions. COMMON BRAND NAME(S): Avastin, MVASI, Zirabev What should I tell my health care provider before I take this medicine? They need to know if you have any of these conditions:  diabetes  heart disease  high blood pressure  history of coughing up blood  prior anthracycline chemotherapy (e.g., doxorubicin, daunorubicin, epirubicin)  recent or ongoing radiation therapy  recent or planning to have surgery  stroke  an unusual or allergic reaction to bevacizumab, hamster proteins, mouse proteins, other medicines, foods, dyes, or preservatives  pregnant or trying to get pregnant  breast-feeding How should I use this medicine? This medicine is for infusion into a vein. It is given by a health care professional in a hospital or clinic setting. Talk to your pediatrician regarding the use of this medicine in children. Special care may be needed. Overdosage: If you think you have taken too much of this medicine contact a poison control center or emergency room at once. NOTE: This medicine is only for you. Do not share this medicine with others. What if I miss a dose? It is important not to miss your dose. Call your doctor or health care professional if you are unable to keep an appointment. What may interact with this medicine? Interactions are not expected. This list may not describe all possible interactions. Give your health care provider a list of all the medicines, herbs, non-prescription drugs, or dietary supplements you use. Also tell them if you smoke, drink alcohol, or use illegal drugs. Some items may interact with your medicine. What should I watch for while using this medicine? Your condition will be monitored carefully while  you are receiving this medicine. You will need important blood work and urine testing done while you are taking this medicine. This medicine may increase your risk to bruise or bleed. Call your doctor or health care professional if you notice any unusual bleeding. Before having surgery, talk to your health care provider to make sure it is ok. This drug can increase the risk of poor healing of your surgical site or wound. You will need to stop this drug for 28 days before surgery. After surgery, wait at least 28 days before restarting this drug. Make sure the surgical site or wound is healed enough before restarting this drug. Talk to your health care provider if questions. Do not become pregnant while taking this medicine or for 6 months after stopping it. Women should inform their doctor if they wish to become pregnant or think they might be pregnant. There is a potential for serious side effects to an unborn child. Talk to your health care professional or pharmacist for more information. Do not breast-feed an infant while taking this medicine and for 6 months after the last dose. This medicine has caused ovarian failure in some women. This medicine may interfere with the ability to have a child. You should talk to your doctor or health care professional if you are concerned about your fertility. What side effects may I notice from receiving this medicine? Side effects that you should report to your doctor or health care professional as soon as possible:  allergic reactions like skin rash, itching or hives, swelling of the face, lips, or tongue  chest pain or chest tightness    chills  coughing up blood  high fever  seizures  severe constipation  signs and symptoms of bleeding such as bloody or black, tarry stools; red or dark-brown urine; spitting up blood or brown material that looks like coffee grounds; red spots on the skin; unusual bruising or bleeding from the eye, gums, or nose  signs  and symptoms of a blood clot such as breathing problems; chest pain; severe, sudden headache; pain, swelling, warmth in the leg  signs and symptoms of a stroke like changes in vision; confusion; trouble speaking or understanding; severe headaches; sudden numbness or weakness of the face, arm or leg; trouble walking; dizziness; loss of balance or coordination  stomach pain  sweating  swelling of legs or ankles  vomiting  weight gain Side effects that usually do not require medical attention (report to your doctor or health care professional if they continue or are bothersome):  back pain  changes in taste  decreased appetite  dry skin  nausea  tiredness This list may not describe all possible side effects. Call your doctor for medical advice about side effects. You may report side effects to FDA at 1-800-FDA-1088. Where should I keep my medicine? This drug is given in a hospital or clinic and will not be stored at home. NOTE: This sheet is a summary. It may not cover all possible information. If you have questions about this medicine, talk to your doctor, pharmacist, or health care provider.  2021 Elsevier/Gold Standard (2018-10-17 10:50:46)  

## 2020-04-09 NOTE — Progress Notes (Signed)
Ok to treat today per MD despite labs.

## 2020-04-09 NOTE — Progress Notes (Signed)
Hematology and Oncology Follow Up Visit  Zachary Willis [id number] [birth year] 75 y.o. 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  He is doing the 2 weeks off from the [place].  He is done well with it so far.  I think it might be little bit too early to know how well it is working.  His last CEA level was 770.  He has had no abdominal pain.  There is been no nausea or vomiting.  He has had no issues with cough.  He has had no flareups of the atrial fibrillation.  His INR today is 2.9.  He is on Coumadin at 4 mg a day.  He has had no change in bowel or bladder habits.  The leg swelling is not as bad.  He has had no fever.  There is been no mouth sores.  Overall, his performance status is ECOG 1.      Medications:  Allergies as of 04/09/2020   No Known Allergies     Medication List       Accurate as of April 09, 2020  8:21 AM. If you have any questions, ask your nurse or doctor.        atorvastatin 40 MG tablet Commonly known as: LIPITOR Take 40 mg by mouth daily.   carvedilol 6.25 MG tablet Commonly known as: COREG Take 6.25 mg by mouth 2 (two) times daily with a meal.   ferrous sulfate 325 (65 FE) MG tablet Take 325 mg by mouth daily.   furosemide 40 MG tablet Commonly known as: LASIX Take 20 mg by mouth every other day.   hydrocortisone 10 MG tablet Commonly known as: CORTEF Take 1-2 tablets (10-20 mg total) by mouth See admin instructions. 20mg  in AM 10mg  in PM   ibuprofen  200 MG tablet Commonly known as: ADVIL Take 400 mg by mouth every 6 (six) hours as needed for headache or mild pain.   levothyroxine 88 MCG tablet Commonly known as: SYNTHROID Take 88 mcg by mouth daily before breakfast.   lidocaine-prilocaine cream Commonly known as: EMLA Apply to affected area once   Lonsurf 15-6.14 MG tablet Generic drug: trifluridine-tipiracil Take 5 tablets (75 mg of trifluridine total) by mouth 2 (two) times daily after a meal. 1 hr after AM & PM meals on days 1-5, 8-12. Repeat every 28day   NASAL [place] NA Place 1 spray into the nose daily.   ondansetron 8 MG tablet Commonly known as: Zofran Take 1 tablet (8 mg total) by mouth every 4 (four) hours as needed for nausea.   Ozempic (0.25 or 0.5 MG/DOSE) 2 MG/1.5ML Sopn Generic drug: Semaglutide(0.25 or 0.5MG /DOS) Inject into the skin.   pantoprazole 40 MG tablet Commonly known as: PROTONIX Take 40 mg by mouth every morning.   polyvinyl alcohol 1.4 % ophthalmic solution Commonly known as: LIQUIFILM TEARS Place 1 drop into both eyes daily as needed for dry eyes.   potassium chloride 10 MEQ tablet Commonly known as: KLOR-CON Take 40 mEq by  mouth daily.   telmisartan 80 MG tablet Commonly known as: MICARDIS Take 1 tablet (80 mg total) by mouth daily.   testosterone 4 MG/24HR Pt24 patch Commonly known as: ANDRODERM Place 1 patch onto the skin daily.   warfarin 4 MG tablet Commonly known as: Coumadin Take 1 tablet (4 mg total) by mouth daily.       Allergies: No Known Allergies  Past Medical History, Surgical history, Social history, and Family History were reviewed and updated.  Review of Systems: Review of Systems  Constitutional: Negative.   HENT: Negative.   Eyes: Negative.   Respiratory: Negative.   Cardiovascular: Negative.   Gastrointestinal: Negative.   Genitourinary: Negative.   Musculoskeletal: Negative.   Skin: Negative.   Neurological: Negative.   Endo/Heme/Allergies:  Negative.   Psychiatric/Behavioral: Negative.       Physical Exam:  vitals were not taken for this visit.   Wt Readings from Last 3 Encounters:  04/01/20 244 lb (110.7 kg)  03/12/20 243 lb (110.2 kg)  03/04/20 244 lb 1.9 oz (110.7 kg)    Physical Exam Vitals reviewed.  HENT:     Head: Normocephalic and atraumatic.  Eyes:     Pupils: Pupils are equal, round, and reactive to light.  Cardiovascular:     Rate and Rhythm: Normal rate and regular rhythm.     Heart sounds: Normal heart sounds.     Comments: Cardiac exam shows a regular rate and rhythm.  There is some occasional extra beat.  I do not hear any obvious atrial fibrillation. Pulmonary:     Effort: Pulmonary effort is normal.     Breath sounds: Normal breath sounds.  Abdominal:     General: Bowel sounds are normal.     Palpations: Abdomen is soft.     Comments: Abdominal exam shows laparotomy scars.  He has no fluid wave.  He has no guarding or rebound tenderness.  There is no palpable liver or spleen tip.  Musculoskeletal:        General: No tenderness or deformity. Normal range of motion.     Cervical back: Normal range of motion.  Lymphadenopathy:     Cervical: No cervical adenopathy.  Skin:    General: Skin is warm and dry.     Findings: No erythema or rash.  Neurological:     Mental Status: He is alert and oriented to person, place, and time.  Psychiatric:        Behavior: Behavior normal.        Thought Content: Thought content normal.        Judgment: Judgment normal.    Lab Results  Component Value Date   WBC 1.9 (L) 04/09/2020   HGB 12.7 (L) 04/09/2020   HCT 38.6 (L) 04/09/2020   MCV 93.9 04/09/2020   PLT 64 (L) 04/09/2020   Lab Results  Component Value Date   FERRITIN 153 01/16/2020   IRON 83 01/16/2020   TIBC 274 01/16/2020   UIBC 190 01/16/2020   IRONPCTSAT 30 01/16/2020   Lab Results  Component Value Date   RETICCTPCT 1.3 04/09/2020   RBC 4.09 (L) 04/09/2020   No results found for:  KPAFRELGTCHN, LAMBDASER, KAPLAMBRATIO No results found for: IGGSERUM, IGA, IGMSERUM No results found for: Ronnald Ramp, A1GS, A2GS, Violet Baldy, MSPIKE, SPEI   Chemistry      Component Value Date/Time   NA 142 04/01/2020 0823   K 4.1 04/01/2020 0823   CL 103 04/01/2020 0823   CO2 34 (  H) 04/01/2020 0823   BUN 19 04/01/2020 0823   CREATININE 1.05 04/01/2020 0823      Component Value Date/Time   CALCIUM 9.1 04/01/2020 0823   ALKPHOS 213 (H) 04/01/2020 0823   AST 54 (H) 04/01/2020 0823   ALT 52 (H) 04/01/2020 0823   BILITOT 1.8 (H) 04/01/2020 0823      Impression and Plan: Mr. Brabson is a very pleasant 75 yo caucasian gentleman with metastatic colon cancer.  He now is on Lonsurf/Avastin combination.  He has completed his first cycle of the Lonsurf.  His blood counts certainly are down.  I am not surprised by this.  I still think we can go ahead with the Avastin.  His liver tests are holding steady right now.  His bilirubin is up a little bit.  We will have to monitor this.  We will have him come back in 1 week for his INR check.  We had to make sure that the INR does not get too high.  I will plan to see him back in 3 weeks.  He comes in for his next Avastin at that point.  Hopefully, we will see response with the Lonsurf/Avastin combination.    [name], MD 4/7/20228:21 AM

## 2020-04-13 ENCOUNTER — Other Ambulatory Visit (HOSPITAL_COMMUNITY): Payer: Self-pay

## 2020-04-14 ENCOUNTER — Other Ambulatory Visit (HOSPITAL_COMMUNITY): Payer: Self-pay

## 2020-04-15 ENCOUNTER — Other Ambulatory Visit (HOSPITAL_COMMUNITY): Payer: Self-pay

## 2020-04-16 ENCOUNTER — Other Ambulatory Visit: Payer: Medicare HMO

## 2020-04-16 ENCOUNTER — Other Ambulatory Visit: Payer: Self-pay

## 2020-04-16 ENCOUNTER — Telehealth: Payer: Self-pay | Admitting: *Deleted

## 2020-04-16 ENCOUNTER — Ambulatory Visit: Payer: Medicare HMO

## 2020-04-16 ENCOUNTER — Inpatient Hospital Stay: Payer: Medicare HMO

## 2020-04-16 ENCOUNTER — Other Ambulatory Visit: Payer: Self-pay | Admitting: *Deleted

## 2020-04-16 DIAGNOSIS — R791 Abnormal coagulation profile: Secondary | ICD-10-CM

## 2020-04-16 DIAGNOSIS — D693 Immune thrombocytopenic purpura: Secondary | ICD-10-CM

## 2020-04-16 DIAGNOSIS — C189 Malignant neoplasm of colon, unspecified: Secondary | ICD-10-CM

## 2020-04-16 DIAGNOSIS — Z5112 Encounter for antineoplastic immunotherapy: Secondary | ICD-10-CM | POA: Diagnosis not present

## 2020-04-16 DIAGNOSIS — I480 Paroxysmal atrial fibrillation: Secondary | ICD-10-CM

## 2020-04-16 DIAGNOSIS — C787 Secondary malignant neoplasm of liver and intrahepatic bile duct: Secondary | ICD-10-CM

## 2020-04-16 LAB — CBC WITH DIFFERENTIAL (CANCER CENTER ONLY)
Abs Immature Granulocytes: 0.04 10*3/uL (ref 0.00–0.07)
Basophils Absolute: 0 10*3/uL (ref 0.0–0.1)
Basophils Relative: 1 %
Eosinophils Absolute: 0 10*3/uL (ref 0.0–0.5)
Eosinophils Relative: 3 %
HCT: 39.3 % (ref 39.0–52.0)
Hemoglobin: 12.9 g/dL — ABNORMAL LOW (ref 13.0–17.0)
Immature Granulocytes: 3 %
Lymphocytes Relative: 33 %
Lymphs Abs: 0.5 10*3/uL — ABNORMAL LOW (ref 0.7–4.0)
MCH: 30.8 pg (ref 26.0–34.0)
MCHC: 32.8 g/dL (ref 30.0–36.0)
MCV: 93.8 fL (ref 80.0–100.0)
Monocytes Absolute: 0.3 10*3/uL (ref 0.1–1.0)
Monocytes Relative: 23 %
Neutro Abs: 0.6 10*3/uL — ABNORMAL LOW (ref 1.7–7.7)
Neutrophils Relative %: 37 %
Platelet Count: 104 10*3/uL — ABNORMAL LOW (ref 150–400)
RBC: 4.19 MIL/uL — ABNORMAL LOW (ref 4.22–5.81)
RDW: 15.7 % — ABNORMAL HIGH (ref 11.5–15.5)
WBC Count: 1.5 10*3/uL — ABNORMAL LOW (ref 4.0–10.5)
nRBC: 2.7 % — ABNORMAL HIGH (ref 0.0–0.2)

## 2020-04-16 LAB — PROTIME-INR
INR: 5.4 (ref 0.8–1.2)
Prothrombin Time: 49.2 seconds — ABNORMAL HIGH (ref 11.4–15.2)

## 2020-04-16 NOTE — Progress Notes (Unsigned)
PT/INR GIVEN TO Ky Barban RN AT 2518 04/16/20

## 2020-04-16 NOTE — Telephone Encounter (Signed)
Patient's wife notified per order of Dr. Marin Olp that it is ok to restart Lonsurf on Monday, 04/20/20, to hold Coumadin for two days and then restart at 2 mg daily.  Teach back done.  Message sent to scheduling for pt to return next week for lab only appt per order of Dr. Marin Olp.

## 2020-04-22 ENCOUNTER — Inpatient Hospital Stay: Payer: Medicare HMO

## 2020-04-22 DIAGNOSIS — R791 Abnormal coagulation profile: Secondary | ICD-10-CM

## 2020-04-22 DIAGNOSIS — C189 Malignant neoplasm of colon, unspecified: Secondary | ICD-10-CM

## 2020-04-22 DIAGNOSIS — D693 Immune thrombocytopenic purpura: Secondary | ICD-10-CM

## 2020-04-22 DIAGNOSIS — Z5112 Encounter for antineoplastic immunotherapy: Secondary | ICD-10-CM | POA: Diagnosis not present

## 2020-04-22 LAB — CMP (CANCER CENTER ONLY)
ALT: 46 U/L — ABNORMAL HIGH (ref 0–44)
AST: 51 U/L — ABNORMAL HIGH (ref 15–41)
Albumin: 3.3 g/dL — ABNORMAL LOW (ref 3.5–5.0)
Alkaline Phosphatase: 260 U/L — ABNORMAL HIGH (ref 38–126)
Anion gap: 5 (ref 5–15)
BUN: 18 mg/dL (ref 8–23)
CO2: 35 mmol/L — ABNORMAL HIGH (ref 22–32)
Calcium: 9.3 mg/dL (ref 8.9–10.3)
Chloride: 104 mmol/L (ref 98–111)
Creatinine: 1.08 mg/dL (ref 0.61–1.24)
GFR, Estimated: >60 mL/min (ref >60)
Glucose, Bld: 180 mg/dL — ABNORMAL HIGH (ref 70–99)
Potassium: 4.3 mmol/L (ref 3.5–5.1)
Sodium: 144 mmol/L (ref 135–145)
Total Bilirubin: 1.5 mg/dL — ABNORMAL HIGH (ref 0.3–1.2)
Total Protein: 6.1 g/dL — ABNORMAL LOW (ref 6.5–8.1)

## 2020-04-22 LAB — CBC WITH DIFFERENTIAL (CANCER CENTER ONLY)
Abs Immature Granulocytes: 0.34 10*3/uL — ABNORMAL HIGH (ref 0.00–0.07)
Basophils Absolute: 0.1 10*3/uL (ref 0.0–0.1)
Basophils Relative: 1 %
Eosinophils Absolute: 0 10*3/uL (ref 0.0–0.5)
Eosinophils Relative: 0 %
HCT: 41.2 % (ref 39.0–52.0)
Hemoglobin: 13.3 g/dL (ref 13.0–17.0)
Immature Granulocytes: 8 %
Lymphocytes Relative: 9 %
Lymphs Abs: 0.4 10*3/uL — ABNORMAL LOW (ref 0.7–4.0)
MCH: 30.8 pg (ref 26.0–34.0)
MCHC: 32.3 g/dL (ref 30.0–36.0)
MCV: 95.4 fL (ref 80.0–100.0)
Monocytes Absolute: 0.5 10*3/uL (ref 0.1–1.0)
Monocytes Relative: 11 %
Neutro Abs: 3.2 10*3/uL (ref 1.7–7.7)
Neutrophils Relative %: 71 %
Platelet Count: 104 10*3/uL — ABNORMAL LOW (ref 150–400)
RBC: 4.32 MIL/uL (ref 4.22–5.81)
RDW: 15.9 % — ABNORMAL HIGH (ref 11.5–15.5)
WBC Count: 4.5 10*3/uL (ref 4.0–10.5)
nRBC: 0.7 % — ABNORMAL HIGH (ref 0.0–0.2)

## 2020-04-22 LAB — PROTIME-INR
INR: 1.9 — ABNORMAL HIGH (ref 0.8–1.2)
Prothrombin Time: 21.7 seconds — ABNORMAL HIGH (ref 11.4–15.2)

## 2020-04-29 ENCOUNTER — Inpatient Hospital Stay (HOSPITAL_BASED_OUTPATIENT_CLINIC_OR_DEPARTMENT_OTHER): Payer: Medicare HMO | Admitting: Hematology & Oncology

## 2020-04-29 ENCOUNTER — Inpatient Hospital Stay: Payer: Medicare HMO

## 2020-04-29 ENCOUNTER — Other Ambulatory Visit: Payer: Self-pay

## 2020-04-29 ENCOUNTER — Telehealth: Payer: Self-pay

## 2020-04-29 VITALS — BP 124/79 | HR 56 | Temp 98.2°F | Resp 20 | Wt 248.0 lb

## 2020-04-29 DIAGNOSIS — C787 Secondary malignant neoplasm of liver and intrahepatic bile duct: Secondary | ICD-10-CM

## 2020-04-29 DIAGNOSIS — I482 Chronic atrial fibrillation, unspecified: Secondary | ICD-10-CM

## 2020-04-29 DIAGNOSIS — C189 Malignant neoplasm of colon, unspecified: Secondary | ICD-10-CM

## 2020-04-29 DIAGNOSIS — Z5112 Encounter for antineoplastic immunotherapy: Secondary | ICD-10-CM | POA: Diagnosis not present

## 2020-04-29 LAB — CBC WITH DIFFERENTIAL (CANCER CENTER ONLY)
Abs Immature Granulocytes: 0.06 10*3/uL (ref 0.00–0.07)
Basophils Absolute: 0 10*3/uL (ref 0.0–0.1)
Basophils Relative: 1 %
Eosinophils Absolute: 0 10*3/uL (ref 0.0–0.5)
Eosinophils Relative: 0 %
HCT: 36.7 % — ABNORMAL LOW (ref 39.0–52.0)
Hemoglobin: 12 g/dL — ABNORMAL LOW (ref 13.0–17.0)
Immature Granulocytes: 1 %
Lymphocytes Relative: 7 %
Lymphs Abs: 0.4 10*3/uL — ABNORMAL LOW (ref 0.7–4.0)
MCH: 30.8 pg (ref 26.0–34.0)
MCHC: 32.7 g/dL (ref 30.0–36.0)
MCV: 94.1 fL (ref 80.0–100.0)
Monocytes Absolute: 0.2 10*3/uL (ref 0.1–1.0)
Monocytes Relative: 4 %
Neutro Abs: 4.5 10*3/uL (ref 1.7–7.7)
Neutrophils Relative %: 87 %
Platelet Count: 73 10*3/uL — ABNORMAL LOW (ref 150–400)
RBC: 3.9 MIL/uL — ABNORMAL LOW (ref 4.22–5.81)
RDW: 15.6 % — ABNORMAL HIGH (ref 11.5–15.5)
WBC Count: 5.1 10*3/uL (ref 4.0–10.5)
nRBC: 0.6 % — ABNORMAL HIGH (ref 0.0–0.2)

## 2020-04-29 LAB — CMP (CANCER CENTER ONLY)
ALT: 50 U/L — ABNORMAL HIGH (ref 0–44)
AST: 50 U/L — ABNORMAL HIGH (ref 15–41)
Albumin: 3.1 g/dL — ABNORMAL LOW (ref 3.5–5.0)
Alkaline Phosphatase: 203 U/L — ABNORMAL HIGH (ref 38–126)
Anion gap: 5 (ref 5–15)
BUN: 23 mg/dL (ref 8–23)
CO2: 31 mmol/L (ref 22–32)
Calcium: 9.2 mg/dL (ref 8.9–10.3)
Chloride: 106 mmol/L (ref 98–111)
Creatinine: 1.04 mg/dL (ref 0.61–1.24)
GFR, Estimated: >60 mL/min (ref >60)
Glucose, Bld: 148 mg/dL — ABNORMAL HIGH (ref 70–99)
Potassium: 4.6 mmol/L (ref 3.5–5.1)
Sodium: 142 mmol/L (ref 135–145)
Total Bilirubin: 1.8 mg/dL — ABNORMAL HIGH (ref 0.3–1.2)
Total Protein: 5.6 g/dL — ABNORMAL LOW (ref 6.5–8.1)

## 2020-04-29 LAB — PROTIME-INR
INR: 1.3 — ABNORMAL HIGH (ref 0.8–1.2)
Prothrombin Time: 15.9 seconds — ABNORMAL HIGH (ref 11.4–15.2)

## 2020-04-29 LAB — CEA (IN HOUSE-CHCC): CEA (CHCC-In House): 1085.6 ng/mL — ABNORMAL HIGH (ref 0.00–5.00)

## 2020-04-29 NOTE — Telephone Encounter (Signed)
appts adjusted per 04/29/20 los and they req to view the sch on my chart   Zachary Willis

## 2020-04-29 NOTE — Progress Notes (Signed)
Hematology and Oncology Follow Up Visit  Zachary Willis [id number] [birth year] 75 y.o. 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  Unfortunately, we probably would not be able to do the Avastin today.  Over the weekend, had a little bit of an accident on the left lower leg.  A chair fell on the left lower leg.  He has a little bit of a gash in the leg.  It is not infected.  There is no obvious bleeding.  Is a little bit of oozing.  He is on Coumadin.  His INR this morning is 1.3.  He has little bit of thrombocytopenia from the [place].  I just think that giving him Avastin today would really cause wound healing issues.  As such, we will have to hold the Avastin.  He is on the [facility].  He is doing well with the Lonsurf.  He is having no problems with nausea or vomiting.  Had 1 episode of diarrhea.  This was improved with Imodium.  He has had no fever.  There is been no cough.  He has had no flareups of the atrial fibrillation.  His last CEA level was 881.  Again he really has not been on Lonsurf all that long.  Currently, his performance status is ECOG 1.        Medications:  Allergies as of 04/29/2020   No Known Allergies     Medication List       Accurate as of April 29, 2020  8:19 AM. If you have any questions, ask your nurse or doctor.        atorvastatin 40 MG tablet Commonly known as: LIPITOR Take 40 mg by mouth daily.   carvedilol 6.25 MG tablet Commonly known as: COREG Take 6.25 mg  by mouth 2 (two) times daily with a meal.   ferrous sulfate 325 (65 FE) MG tablet Take 325 mg by mouth daily.   furosemide 40 MG tablet Commonly known as: LASIX Take 20 mg by mouth every other day.   hydrocortisone 10 MG tablet Commonly known as: CORTEF Take 1-2 tablets (10-20 mg total) by mouth See admin instructions. 20mg  in AM 10mg  in PM   ibuprofen 200 MG tablet Commonly known as: ADVIL Take 400 mg by mouth every 6 (six) hours as needed for headache or mild pain.   levothyroxine 88 MCG tablet Commonly known as: SYNTHROID Take 88 mcg by mouth daily before breakfast.   lidocaine-prilocaine cream Commonly known as: EMLA Apply to affected area once   Lonsurf 15-6.14 MG tablet Generic drug: trifluridine-tipiracil Take 5 tablets (75 mg of trifluridine total) by mouth 2 (two) times daily after a meal. 1 hr after AM & PM meals on days 1-5, 8-12. Repeat every 28 days. Take 1 hr after morning and evening meals.   NASAL [place] NA Place 1 spray into the nose daily.   ondansetron 8 MG tablet Commonly known as: Zofran Take 1 tablet (8 mg total) by mouth every 4 (four) hours as needed  for nausea.   Ozempic (0.25 or 0.5 MG/DOSE) 2 MG/1.5ML Sopn Generic drug: Semaglutide(0.25 or 0.5MG /DOS) Inject into the skin.   pantoprazole 40 MG tablet Commonly known as: PROTONIX Take 40 mg by mouth every morning.   polyvinyl alcohol 1.4 % ophthalmic solution Commonly known as: LIQUIFILM TEARS Place 1 drop into both eyes daily as needed for dry eyes.   Potassium Chloride ER 20 MEQ Tbcr Take 20 mEq by mouth daily.   telmisartan 80 MG tablet Commonly known as: MICARDIS Take 1 tablet (80 mg total) by mouth daily.   testosterone 4 MG/24HR Pt24 patch Commonly known as: ANDRODERM Place 1 patch onto the skin daily.   warfarin 4 MG tablet Commonly known as: Coumadin Take 1 tablet (4 mg total) by mouth daily.       Allergies: No Known Allergies  Past Medical History, Surgical history,  Social history, and Family History were reviewed and updated.  Review of Systems: Review of Systems  Constitutional: Negative.   HENT: Negative.   Eyes: Negative.   Respiratory: Negative.   Cardiovascular: Negative.   Gastrointestinal: Negative.   Genitourinary: Negative.   Musculoskeletal: Negative.   Skin: Negative.   Neurological: Negative.   Endo/Heme/Allergies: Negative.   Psychiatric/Behavioral: Negative.       Physical Exam:  vitals were not taken for this visit.   Wt Readings from Last 3 Encounters:  04/09/20 245 lb (111.1 kg)  04/01/20 244 lb (110.7 kg)  03/12/20 243 lb (110.2 kg)    Physical Exam Vitals reviewed.  HENT:     Head: Normocephalic and atraumatic.  Eyes:     Pupils: Pupils are equal, round, and reactive to light.  Cardiovascular:     Rate and Rhythm: Normal rate and regular rhythm.     Heart sounds: Normal heart sounds.     Comments: Cardiac exam shows a regular rate and rhythm.  There is some occasional extra beat.  I do not hear any obvious atrial fibrillation. Pulmonary:     Effort: Pulmonary effort is normal.     Breath sounds: Normal breath sounds.  Abdominal:     General: Bowel sounds are normal.     Palpations: Abdomen is soft.     Comments: Abdominal exam shows laparotomy scars.  He has no fluid wave.  He has no guarding or rebound tenderness.  There is no palpable liver or spleen tip.  Musculoskeletal:        General: No tenderness or deformity. Normal range of motion.     Cervical back: Normal range of motion.  Lymphadenopathy:     Cervical: No cervical adenopathy.  Skin:    General: Skin is warm and dry.     Findings: No erythema or rash.  Neurological:     Mental Status: He is alert and oriented to person, place, and time.  Psychiatric:        Behavior: Behavior normal.        Thought Content: Thought content normal.        Judgment: Judgment normal.    Lab Results  Component Value Date   WBC 5.1 04/29/2020   HGB 12.0  (L) 04/29/2020   HCT 36.7 (L) 04/29/2020   MCV 94.1 04/29/2020   PLT 73 (L) 04/29/2020   Lab Results  Component Value Date   FERRITIN 239 04/09/2020   IRON 76 04/09/2020   TIBC 209 04/09/2020   UIBC 132 04/09/2020   IRONPCTSAT 37 04/09/2020   Lab Results  Component Value Date  RETICCTPCT 1.3 04/09/2020   RBC 3.90 (L) 04/29/2020   No results found for: KPAFRELGTCHN, LAMBDASER, KAPLAMBRATIO No results found for: IGGSERUM, IGA, IGMSERUM No results found for: Odetta Pink, SPEI   Chemistry      Component Value Date/Time   NA 144 04/22/2020 0906   K 4.3 04/22/2020 0906   CL 104 04/22/2020 0906   CO2 35 (H) 04/22/2020 0906   BUN 18 04/22/2020 0906   CREATININE 1.08 04/22/2020 0906      Component Value Date/Time   CALCIUM 9.3 04/22/2020 0906   ALKPHOS 260 (H) 04/22/2020 0906   AST 51 (H) 04/22/2020 0906   ALT 46 (H) 04/22/2020 0906   BILITOT 1.5 (H) 04/22/2020 0906      Impression and Plan: Mr. Jurewicz is a very pleasant 75 yo caucasian gentleman with metastatic colon cancer.  He now is on Lonsurf/Avastin combination.  He has completed his first cycle of the Lonsurf.  Currently, he is on the second week on the Lonsurf of his second cycle.  Again we will hold the Avastin for couple weeks.  This should give this wound in the left lower leg better time to heal up.  I told him to increase his Coumadin from 2 mg to 2 mg alternating with 4 mg.  I would like to see him back in 2 weeks.  Overall, his quality life still is doing quite nicely.      [name], MD 4/27/20228:19 AM

## 2020-04-30 ENCOUNTER — Other Ambulatory Visit: Payer: Self-pay | Admitting: Hematology & Oncology

## 2020-04-30 DIAGNOSIS — I10 Essential (primary) hypertension: Secondary | ICD-10-CM

## 2020-05-04 ENCOUNTER — Other Ambulatory Visit: Payer: Self-pay | Admitting: *Deleted

## 2020-05-04 ENCOUNTER — Ambulatory Visit (HOSPITAL_BASED_OUTPATIENT_CLINIC_OR_DEPARTMENT_OTHER)
Admission: RE | Admit: 2020-05-04 | Discharge: 2020-05-04 | Disposition: A | Discharge status: Home / Self Care | Payer: Medicare HMO | Source: Ambulatory Visit | Attending: Hematology & Oncology | Admitting: Hematology & Oncology

## 2020-05-04 ENCOUNTER — Other Ambulatory Visit: Payer: Self-pay

## 2020-05-04 ENCOUNTER — Telehealth: Payer: Self-pay | Admitting: *Deleted

## 2020-05-04 DIAGNOSIS — R059 Cough, unspecified: Secondary | ICD-10-CM | POA: Diagnosis present

## 2020-05-04 NOTE — Telephone Encounter (Signed)
Received call from Wife [name] who stated that beginning last night patient had a dry cough that was persistent.  No SOB, fever, phlegm, production of sputum.  covid test negative.  Patient took Chloricidin which helped.  Dr Marin Olp notified.  CXR ordered.  Also suggested Robitussin if current medication doesn't work.  Patient and wife thankful for call.

## 2020-05-11 ENCOUNTER — Other Ambulatory Visit (HOSPITAL_COMMUNITY): Payer: Self-pay

## 2020-05-12 ENCOUNTER — Encounter: Payer: Self-pay | Admitting: Hematology & Oncology

## 2020-05-12 ENCOUNTER — Inpatient Hospital Stay: Payer: Medicare HMO

## 2020-05-12 ENCOUNTER — Inpatient Hospital Stay (HOSPITAL_BASED_OUTPATIENT_CLINIC_OR_DEPARTMENT_OTHER): Payer: Medicare HMO | Admitting: Hematology & Oncology

## 2020-05-12 ENCOUNTER — Inpatient Hospital Stay: Payer: Medicare HMO | Attending: Hematology & Oncology

## 2020-05-12 ENCOUNTER — Other Ambulatory Visit: Payer: Self-pay

## 2020-05-12 VITALS — BP 124/80 | HR 53 | Temp 98.6°F | Resp 18 | Wt 252.0 lb

## 2020-05-12 DIAGNOSIS — Z79899 Other long term (current) drug therapy: Secondary | ICD-10-CM | POA: Insufficient documentation

## 2020-05-12 DIAGNOSIS — C787 Secondary malignant neoplasm of liver and intrahepatic bile duct: Secondary | ICD-10-CM | POA: Insufficient documentation

## 2020-05-12 DIAGNOSIS — C189 Malignant neoplasm of colon, unspecified: Secondary | ICD-10-CM | POA: Diagnosis not present

## 2020-05-12 DIAGNOSIS — I4891 Unspecified atrial fibrillation: Secondary | ICD-10-CM | POA: Insufficient documentation

## 2020-05-12 DIAGNOSIS — Z5112 Encounter for antineoplastic immunotherapy: Secondary | ICD-10-CM | POA: Insufficient documentation

## 2020-05-12 DIAGNOSIS — I482 Chronic atrial fibrillation, unspecified: Secondary | ICD-10-CM

## 2020-05-12 LAB — CBC WITH DIFFERENTIAL (CANCER CENTER ONLY)
Abs Immature Granulocytes: 0.02 10*3/uL (ref 0.00–0.07)
Basophils Absolute: 0 10*3/uL (ref 0.0–0.1)
Basophils Relative: 1 %
Eosinophils Absolute: 0.1 10*3/uL (ref 0.0–0.5)
Eosinophils Relative: 4 %
HCT: 38 % — ABNORMAL LOW (ref 39.0–52.0)
Hemoglobin: 12.1 g/dL — ABNORMAL LOW (ref 13.0–17.0)
Immature Granulocytes: 1 %
Lymphocytes Relative: 16 %
Lymphs Abs: 0.3 10*3/uL — ABNORMAL LOW (ref 0.7–4.0)
MCH: 30.3 pg (ref 26.0–34.0)
MCHC: 31.8 g/dL (ref 30.0–36.0)
MCV: 95 fL (ref 80.0–100.0)
Monocytes Absolute: 0.5 10*3/uL (ref 0.1–1.0)
Monocytes Relative: 30 %
Neutro Abs: 0.9 10*3/uL — ABNORMAL LOW (ref 1.7–7.7)
Neutrophils Relative %: 48 %
Platelet Count: 115 10*3/uL — ABNORMAL LOW (ref 150–400)
RBC: 4 MIL/uL — ABNORMAL LOW (ref 4.22–5.81)
RDW: 18.3 % — ABNORMAL HIGH (ref 11.5–15.5)
WBC Count: 1.7 10*3/uL — ABNORMAL LOW (ref 4.0–10.5)
nRBC: 2.9 % — ABNORMAL HIGH (ref 0.0–0.2)

## 2020-05-12 LAB — CMP (CANCER CENTER ONLY)
ALT: 38 U/L (ref 0–44)
AST: 32 U/L (ref 15–41)
Albumin: 3.1 g/dL — ABNORMAL LOW (ref 3.5–5.0)
Alkaline Phosphatase: 205 U/L — ABNORMAL HIGH (ref 38–126)
Anion gap: 5 (ref 5–15)
BUN: 13 mg/dL (ref 8–23)
CO2: 34 mmol/L — ABNORMAL HIGH (ref 22–32)
Calcium: 8.8 mg/dL — ABNORMAL LOW (ref 8.9–10.3)
Chloride: 105 mmol/L (ref 98–111)
Creatinine: 0.96 mg/dL (ref 0.61–1.24)
GFR, Estimated: >60 mL/min (ref >60)
Glucose, Bld: 116 mg/dL — ABNORMAL HIGH (ref 70–99)
Potassium: 3.7 mmol/L (ref 3.5–5.1)
Sodium: 144 mmol/L (ref 135–145)
Total Bilirubin: 2.3 mg/dL — ABNORMAL HIGH (ref 0.3–1.2)
Total Protein: 5.6 g/dL — ABNORMAL LOW (ref 6.5–8.1)

## 2020-05-12 LAB — PROTIME-INR
INR: 3.9 — ABNORMAL HIGH (ref 0.8–1.2)
Prothrombin Time: 38.2 seconds — ABNORMAL HIGH (ref 11.4–15.2)

## 2020-05-12 LAB — CEA (IN HOUSE-CHCC): CEA (CHCC-In House): 919.94 ng/mL — ABNORMAL HIGH (ref 0.00–5.00)

## 2020-05-12 MED ORDER — SODIUM CHLORIDE 0.9% FLUSH
10.0000 mL | INTRAVENOUS | Status: DC | PRN
  Administered 2020-05-12: 10 mL
  Filled 2020-05-12: qty 10

## 2020-05-12 MED ORDER — SODIUM CHLORIDE 0.9 % IV SOLN
7.5000 mg/kg | Freq: Once | INTRAVENOUS | Status: AC
  Administered 2020-05-12: 800 mg via INTRAVENOUS
  Filled 2020-05-12: qty 32

## 2020-05-12 MED ORDER — HEPARIN SOD (PORK) LOCK FLUSH 100 UNIT/ML IV SOLN
500.0000 [IU] | Freq: Once | INTRAVENOUS | Status: AC | PRN
  Administered 2020-05-12: 500 [IU]
  Filled 2020-05-12: qty 5

## 2020-05-12 MED ORDER — SODIUM CHLORIDE 0.9 % IV SOLN
Freq: Once | INTRAVENOUS | Status: AC
  Filled 2020-05-12: qty 250

## 2020-05-12 NOTE — Progress Notes (Signed)
OK to treat today without a urine specimen and with close time frame to wound care per Dr. Marin Olp.

## 2020-05-12 NOTE — Progress Notes (Signed)
Hematology and Oncology Follow Up Visit  Zachary Willis [id number] 08-Sep-1945 75 y.o. 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  Thankfully, his left leg wound looks much better.  He is starting to eschar.  I do not see any erythema.  There is no discharge.  I think we can now get him back onto Avastin.  He is on Lonsurf.  He seems to be tolerating this pretty well.  His blood counts are down a little bit today.  He has an INR of 3.9.  I told him to drop the Coumadin to 2 mg daily.  We will have to recheck his INR next week.  I do worry a little bit because the CEA is going up.  Again has not been on Lonsurf or Avastin for that long.  His last CEA was 1085 in late April.  He has had no fever.  He has had no change in bowel or bladder habits.  He has had no headache.  He has had no palpitations.  He does have the atrial fibrillation.  He continues on Coumadin for this.  Overall, I would say his performance status is probably ECOG 2.    Medications:  Allergies as of 05/12/2020   No Known Allergies     Medication List       Accurate as of May 12, 2020 12:40 PM. If you have any questions, ask your nurse or doctor.        allopurinol 300 MG tablet Commonly known as: ZYLOPRIM Take 300 mg by mouth daily.   atorvastatin 40 MG tablet Commonly known as: LIPITOR Take 40 mg by mouth daily.   carvedilol 6.25 MG tablet Commonly known as: COREG Take 6.25 mg by mouth 2 (two) times daily with a  meal.   ferrous sulfate 325 (65 FE) MG tablet Take 325 mg by mouth daily.   furosemide 40 MG tablet Commonly known as: LASIX Take 20 mg by mouth every other day.   hydrocortisone 10 MG tablet Commonly known as: CORTEF Take 1-2 tablets (10-20 mg total) by mouth See admin instructions. 20mg  in AM 10mg  in PM   ibuprofen 200 MG tablet Commonly known as: ADVIL Take 400 mg by mouth every 6 (six) hours as needed for headache or mild pain.   levothyroxine 88 MCG tablet Commonly known as: SYNTHROID Take 88 mcg by mouth daily before breakfast.   lidocaine-prilocaine cream Commonly known as: EMLA Apply to affected area once   Lonsurf 15-6.14 MG tablet Generic drug: trifluridine-tipiracil Take 5 tablets (75 mg of trifluridine total) by mouth 2 (two) times daily after a meal. 1 hr after AM & PM meals on days 1-5, 8-12. Repeat every 28 days. Take 1 hr after morning and evening meals.   NASAL [place] NA Place 1 spray into the nose daily.   ondansetron 8 MG tablet Commonly known as: Zofran Take 1 tablet (8 mg total) by mouth every 4 (four) hours as needed for nausea.   Ozempic (0.25 or 0.5 MG/DOSE)  2 MG/1.5ML Sopn Generic drug: Semaglutide(0.25 or 0.5MG /DOS) Inject into the skin.   pantoprazole 40 MG tablet Commonly known as: PROTONIX Take 40 mg by mouth every morning.   polyvinyl alcohol 1.4 % ophthalmic solution Commonly known as: LIQUIFILM TEARS Place 1 drop into both eyes daily as needed for dry eyes.   Potassium Chloride ER 20 MEQ Tbcr Take 20 mEq by mouth daily.   telmisartan 80 MG tablet Commonly known as: MICARDIS TAKE 1 TABLET BY MOUTH EVERY DAY   testosterone 4 MG/24HR Pt24 patch Commonly known as: ANDRODERM Place 1 patch onto the skin daily.   warfarin 4 MG tablet Commonly known as: Coumadin Take 1 tablet (4 mg total) by mouth daily. What changed: how much to take       Allergies: No Known Allergies  Past Medical History, Surgical history, Social history,  and Family History were reviewed and updated.  Review of Systems: Review of Systems  Constitutional: Negative.   HENT: Negative.   Eyes: Negative.   Respiratory: Negative.   Cardiovascular: Negative.   Gastrointestinal: Negative.   Genitourinary: Negative.   Musculoskeletal: Negative.   Skin: Negative.   Neurological: Negative.   Endo/Heme/Allergies: Negative.   Psychiatric/Behavioral: Negative.       Physical Exam:  weight is 252 lb (114.3 kg). His oral temperature is 98.6 F (37 C). His blood pressure is 124/80 and his pulse is 53 (abnormal). His respiration is 18 and oxygen saturation is 99%.   Wt Readings from Last 3 Encounters:  05/12/20 252 lb (114.3 kg)  04/29/20 248 lb (112.5 kg)  04/09/20 245 lb (111.1 kg)    Physical Exam Vitals reviewed.  HENT:     Head: Normocephalic and atraumatic.  Eyes:     Pupils: Pupils are equal, round, and reactive to light.  Cardiovascular:     Rate and Rhythm: Normal rate and regular rhythm.     Heart sounds: Normal heart sounds.     Comments: Cardiac exam shows a regular rate and rhythm.  There is some occasional extra beat.  I do not hear any obvious atrial fibrillation. Pulmonary:     Effort: Pulmonary effort is normal.     Breath sounds: Normal breath sounds.  Abdominal:     General: Bowel sounds are normal.     Palpations: Abdomen is soft.     Comments: Abdominal exam shows laparotomy scars.  He has no fluid wave.  He has no guarding or rebound tenderness.  There is no palpable liver or spleen tip.  Musculoskeletal:        General: No tenderness or deformity. Normal range of motion.     Cervical back: Normal range of motion.     Comments: On the anterior aspect of the left lower leg, he does have the wound that is healing.  There is an eschar.  There is mild edema.  No erythema is noted.  Lymphadenopathy:     Cervical: No cervical adenopathy.  Skin:    General: Skin is warm and dry.     Findings: No erythema or rash.   Neurological:     Mental Status: He is alert and oriented to person, place, and time.  Psychiatric:        Behavior: Behavior normal.        Thought Content: Thought content normal.        Judgment: Judgment normal.    Lab Results  Component Value Date   WBC 1.7 (L) 05/12/2020   HGB 12.1 (L)  05/12/2020   HCT 38.0 (L) 05/12/2020   MCV 95.0 05/12/2020   PLT 115 (L) 05/12/2020   Lab Results  Component Value Date   FERRITIN 239 04/09/2020   IRON 76 04/09/2020   TIBC 209 04/09/2020   UIBC 132 04/09/2020   IRONPCTSAT 37 04/09/2020   Lab Results  Component Value Date   RETICCTPCT 1.3 04/09/2020   RBC 4.00 (L) 05/12/2020   No results found for: KPAFRELGTCHN, LAMBDASER, KAPLAMBRATIO No results found for: IGGSERUM, IGA, IGMSERUM No results found for: Odetta Pink, SPEI   Chemistry      Component Value Date/Time   NA 144 05/12/2020 0944   K 3.7 05/12/2020 0944   CL 105 05/12/2020 0944   CO2 34 (H) 05/12/2020 0944   BUN 13 05/12/2020 0944   CREATININE 0.96 05/12/2020 0944      Component Value Date/Time   CALCIUM 8.8 (L) 05/12/2020 0944   ALKPHOS 205 (H) 05/12/2020 0944   AST 32 05/12/2020 0944   ALT 38 05/12/2020 0944   BILITOT 2.3 (H) 05/12/2020 0944      Impression and Plan: Mr. Moates is a very pleasant 75 yo caucasian gentleman with metastatic colon cancer.  He now is on Lonsurf/Avastin combination.  He has completed his first 2 cycles of the Lonsurf.  Currently, he is on the third cycle of Lonsurf.  Hopefully, by getting back on Avastin, we might be able to see a response.  1 option that we may have for him if this protocol does not work is to put him on a variant of FOLFIRI except instead of irinotecan we can use liposomal irinotecan.  I just do not want to give up too quickly on the Lonsurf/Avastin combination.  I also adjusted his Lasix a little bit.  I gave this to his wife.  He has little bit of swelling  in the legs.  We will have to follow him closely.  I will have him come back in a week to have his labs checked.  I will plan to see him back in another 3 weeks.        [name], MD 5/10/202212:40 PM

## 2020-05-12 NOTE — Patient Instructions (Signed)
[facility] [place]  Discharge Instructions: Thank you for choosing [facility] to provide your oncology and hematology care.   If you have a lab appointment with the [facility], please go directly to the [facility] and check in at the registration area.  Wear comfortable clothing and clothing appropriate for easy access to any Portacath or PICC line.   We strive to give you quality time with your provider. You may need to reschedule your appointment if you arrive late (15 or more minutes).  Arriving late affects you and other patients whose appointments are after yours.  Also, if you miss three or more appointments without notifying the office, you may be dismissed from the clinic at the provider's discretion.      For prescription refill requests, have your pharmacy contact our office and allow 72 hours for refills to be completed.    Today you received the following chemotherapy and/or immunotherapy agents zirabev       To help prevent nausea and vomiting after your treatment, we encourage you to take your nausea medication as directed.  BELOW ARE SYMPTOMS THAT SHOULD BE REPORTED IMMEDIATELY: . *FEVER GREATER THAN 100.4 F (38 C) OR HIGHER . *CHILLS OR SWEATING . *NAUSEA AND VOMITING THAT IS NOT CONTROLLED WITH YOUR NAUSEA MEDICATION . *UNUSUAL SHORTNESS OF BREATH . *UNUSUAL BRUISING OR BLEEDING . *URINARY PROBLEMS (pain or burning when urinating, or frequent urination) . *BOWEL PROBLEMS (unusual diarrhea, constipation, pain near the anus) . TENDERNESS IN MOUTH AND THROAT WITH OR WITHOUT PRESENCE OF ULCERS (sore throat, sores in mouth, or a toothache) . UNUSUAL RASH, SWELLING OR PAIN  . UNUSUAL VAGINAL DISCHARGE OR ITCHING   Items with * indicate a potential emergency and should be followed up as soon as possible or go to the Emergency Department if any problems should occur.  Please show the CHEMOTHERAPY ALERT CARD or IMMUNOTHERAPY ALERT CARD  at check-in to the Emergency Department and triage nurse. Should you have questions after your visit or need to cancel or reschedule your appointment, please contact [facility]  [phone] and follow the prompts.  Office hours are 8:00 a.m. to 4:30 p.m. Monday - Friday. Please note that voicemails left after 4:00 p.m. may not be returned until the following business day.  We are closed weekends and major holidays. You have access to a nurse at all times for urgent questions. Please call the main number to the clinic [phone] and follow the prompts.  For any non-urgent questions, you may also contact your provider using MyChart. We now offer e-Visits for anyone 34 and older to request care online for non-urgent symptoms. For details visit [email or web address].   Also download the MyChart app! Go to the app store, search "MyChart", open the app, select [facility], and log in with your MyChart username and password.  Due to Covid, a mask is required upon entering the hospital/clinic. If you do not have a mask, one will be given to you upon arrival. For doctor visits, patients may have 1 support person aged 105 or older with them. For treatment visits, patients cannot have anyone with them due to current Covid guidelines and our immunocompromised population.

## 2020-05-12 NOTE — Progress Notes (Signed)
Per dr Marin Olp okay to treat today.

## 2020-05-13 ENCOUNTER — Telehealth: Payer: Self-pay

## 2020-05-13 NOTE — Telephone Encounter (Signed)
No 05/12/20 LOS   Zachary Willis 

## 2020-05-14 ENCOUNTER — Other Ambulatory Visit (HOSPITAL_COMMUNITY): Payer: Self-pay

## 2020-05-14 ENCOUNTER — Ambulatory Visit (HOSPITAL_BASED_OUTPATIENT_CLINIC_OR_DEPARTMENT_OTHER)
Admission: RE | Admit: 2020-05-14 | Discharge: 2020-05-14 | Disposition: A | Discharge status: Home / Self Care | Payer: Medicare HMO | Source: Ambulatory Visit | Attending: Hematology & Oncology | Admitting: Hematology & Oncology

## 2020-05-14 ENCOUNTER — Other Ambulatory Visit: Payer: Self-pay | Admitting: *Deleted

## 2020-05-14 ENCOUNTER — Telehealth: Payer: Self-pay | Admitting: *Deleted

## 2020-05-14 ENCOUNTER — Other Ambulatory Visit: Payer: Self-pay

## 2020-05-14 ENCOUNTER — Telehealth: Payer: Self-pay

## 2020-05-14 DIAGNOSIS — R059 Cough, unspecified: Secondary | ICD-10-CM

## 2020-05-14 DIAGNOSIS — C189 Malignant neoplasm of colon, unspecified: Secondary | ICD-10-CM | POA: Diagnosis present

## 2020-05-14 DIAGNOSIS — C787 Secondary malignant neoplasm of liver and intrahepatic bile duct: Secondary | ICD-10-CM | POA: Diagnosis present

## 2020-05-14 MED ORDER — HYDROCODONE BIT-HOMATROP MBR 5-1.5 MG/5ML PO SOLN: 5.0000 mL | Freq: Four times a day (QID) | ORAL | 0 refills | Status: DC | PRN

## 2020-05-14 NOTE — Telephone Encounter (Signed)
Call received from patient's wife stating that pt continues with cough which is now productive with clear white phlegm that is not relieved with OTC cough medicines and sore throat.  She states that pt tested negative for covid with home test on Sunday, 05/10/20.  Dr. Marin Olp notified.  Call placed back to pt.'s wife and pt.'s wife notified per order of Dr. Marin Olp that a prescription for Hycodan will be sent in to pt.'s pharmacy and for pt to come in for a CXR.  Pt.'s wife is appreciative of call back and states that she will bring pt in now for CXR.

## 2020-05-14 NOTE — Telephone Encounter (Signed)
pts wife called as she stated that dr Marin Olp mentioned that he needs weekly labs, no los was sent 5/10 but after revw of the chart note dr e did mention lab appts which have now been made, pt also is already on sch for a f/u sch 06/03/20   Zachary Willis

## 2020-05-14 NOTE — Telephone Encounter (Signed)
Message received from patient's wife, Zachary Willis stating that pt went to be tested for Covid-19 after having CXR today, tested negative for Covid-19 and was prescribed Amoxicillin. She would like to know if pt can take Amoxicillin while taking Lonsurf.  Call placed back to [facility] and message left to inform her per Dr. Marin Olp that it is ok to take Amoxicillin with Lonsurf.  Instructed pt.'s wife to call office back with any questions or concerns.

## 2020-05-19 ENCOUNTER — Other Ambulatory Visit: Payer: Self-pay

## 2020-05-19 ENCOUNTER — Inpatient Hospital Stay: Payer: Medicare HMO

## 2020-05-19 ENCOUNTER — Ambulatory Visit: Payer: Medicare HMO

## 2020-05-19 ENCOUNTER — Other Ambulatory Visit: Payer: Medicare HMO

## 2020-05-19 ENCOUNTER — Ambulatory Visit: Payer: Medicare HMO | Admitting: Hematology & Oncology

## 2020-05-19 DIAGNOSIS — Z5112 Encounter for antineoplastic immunotherapy: Secondary | ICD-10-CM | POA: Diagnosis not present

## 2020-05-19 DIAGNOSIS — C189 Malignant neoplasm of colon, unspecified: Secondary | ICD-10-CM

## 2020-05-19 LAB — CMP (CANCER CENTER ONLY)
ALT: 40 U/L (ref 0–44)
AST: 45 U/L — ABNORMAL HIGH (ref 15–41)
Albumin: 3.1 g/dL — ABNORMAL LOW (ref 3.5–5.0)
Alkaline Phosphatase: 246 U/L — ABNORMAL HIGH (ref 38–126)
Anion gap: 3 — ABNORMAL LOW (ref 5–15)
BUN: 18 mg/dL (ref 8–23)
CO2: 35 mmol/L — ABNORMAL HIGH (ref 22–32)
Calcium: 9.1 mg/dL (ref 8.9–10.3)
Chloride: 103 mmol/L (ref 98–111)
Creatinine: 0.97 mg/dL (ref 0.61–1.24)
GFR, Estimated: >60 mL/min (ref >60)
Glucose, Bld: 172 mg/dL — ABNORMAL HIGH (ref 70–99)
Potassium: 4 mmol/L (ref 3.5–5.1)
Sodium: 141 mmol/L (ref 135–145)
Total Bilirubin: 1.7 mg/dL — ABNORMAL HIGH (ref 0.3–1.2)
Total Protein: 5.3 g/dL — ABNORMAL LOW (ref 6.5–8.1)

## 2020-05-19 LAB — CBC WITH DIFFERENTIAL (CANCER CENTER ONLY)
Abs Immature Granulocytes: 0.62 10*3/uL — ABNORMAL HIGH (ref 0.00–0.07)
Basophils Absolute: 0.1 10*3/uL (ref 0.0–0.1)
Basophils Relative: 1 %
Eosinophils Absolute: 0 10*3/uL (ref 0.0–0.5)
Eosinophils Relative: 0 %
HCT: 37.1 % — ABNORMAL LOW (ref 39.0–52.0)
Hemoglobin: 11.8 g/dL — ABNORMAL LOW (ref 13.0–17.0)
Immature Granulocytes: 10 %
Lymphocytes Relative: 8 %
Lymphs Abs: 0.5 10*3/uL — ABNORMAL LOW (ref 0.7–4.0)
MCH: 29.9 pg (ref 26.0–34.0)
MCHC: 31.8 g/dL (ref 30.0–36.0)
MCV: 94.2 fL (ref 80.0–100.0)
Monocytes Absolute: 0.6 10*3/uL (ref 0.1–1.0)
Monocytes Relative: 9 %
Neutro Abs: 4.4 10*3/uL (ref 1.7–7.7)
Neutrophils Relative %: 72 %
Platelet Count: 99 10*3/uL — ABNORMAL LOW (ref 150–400)
RBC: 3.94 MIL/uL — ABNORMAL LOW (ref 4.22–5.81)
RDW: 18.7 % — ABNORMAL HIGH (ref 11.5–15.5)
WBC Count: 6.1 10*3/uL (ref 4.0–10.5)
nRBC: 2 % — ABNORMAL HIGH (ref 0.0–0.2)

## 2020-05-19 LAB — PROTIME-INR
INR: 1.7 — ABNORMAL HIGH (ref 0.8–1.2)
Prothrombin Time: 20.2 seconds — ABNORMAL HIGH (ref 11.4–15.2)

## 2020-05-20 ENCOUNTER — Telehealth: Payer: Self-pay | Admitting: *Deleted

## 2020-05-20 ENCOUNTER — Telehealth: Payer: Self-pay

## 2020-05-20 DIAGNOSIS — C787 Secondary malignant neoplasm of liver and intrahepatic bile duct: Secondary | ICD-10-CM

## 2020-05-20 DIAGNOSIS — I482 Chronic atrial fibrillation, unspecified: Secondary | ICD-10-CM

## 2020-05-20 DIAGNOSIS — D693 Immune thrombocytopenic purpura: Secondary | ICD-10-CM

## 2020-05-20 DIAGNOSIS — C189 Malignant neoplasm of colon, unspecified: Secondary | ICD-10-CM

## 2020-05-20 DIAGNOSIS — R791 Abnormal coagulation profile: Secondary | ICD-10-CM

## 2020-05-20 NOTE — Telephone Encounter (Signed)
Call returned and message left from patient's wife stating that pt is taking Coumadin 2 mg daily.  Dr. Marin Olp notified.  Call placed back to [facility] notified per order of Dr. Marin Olp to have patient alternate taking Coumadin 2 mg one day and Coumadin 4 mg the next day and for pt to return on Monday, 05/25/20 for repeat PT/INR. Message sent to scheduling. Teach back done.

## 2020-05-20 NOTE — Telephone Encounter (Signed)
S/w pts wife per 05/20/20 sch message and labs have been moved from 5/24 to 5/23    Zachary Willis

## 2020-05-22 IMAGING — NM NUCLEAR MEDICINE LIVER SCAN
8 series · 28 of 28 positions shown · non-contrast
Comparison: CT 06/18/2018.

CLINICAL DATA: Metastatic colon cancer. Unresectable liver
metastasis. Status post percutaneous micro wave ablation of lesions
within caudate and subcapsular aspect of right lobe of liver. First
treatment to left lobe.

EXAM:
NUCLEAR MEDICINE SPECIAL MED RAD PHYSICS CONS; NUCLEAR MEDICINE
RADIO PHARM THERAPY INTRA ARTERIAL; NUCLEAR MEDICINE TREATMENT
PROCEDURE; NUCLEAR MEDICINE LIVER SCAN
TECHNIQUE: In conjunction with the interventional radiologist a Y- Microsphere
dose was calculated utilizing body surface area formulation.
Calculated dose equal 13.5 mCi. Pre therapy MAA liver SPECT scan and
CTA were evaluated. Utilizing a microcatheter system, the hepatic
artery was selected and Y-90 microspheres were delivered in
fractionated aliquots. Radiopharmaceutical was delivered by the
interventional radiologist and nuclear radiologist.
The patient tolerated procedure well. No adverse effects were noted.
Bremsstrahlung planar and SPECT imaging of the abdomen following
intrahepatic arterial delivery of Y-90 microsphere was performed.
RADIOPHARMACEUTICALS:  9.7 millicuries Y- 90 microspheres

[Series 1: y-90 statics · 2.07mm/px · 1 of 1 slices shown (1 of 4)]
[im 1/1]
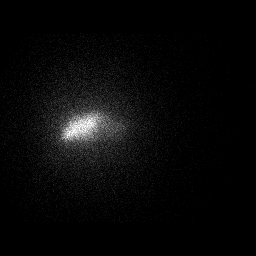

[Series 1: y-90 statics · 2.07mm/px · 1 of 1 slices shown (2 of 4)]
[im 1/1  full-range]
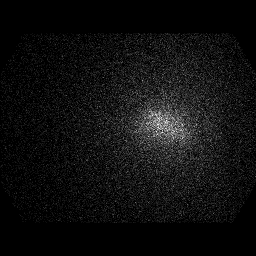

[Series 1: y-90 statics · 2.07mm/px · 1 of 1 slices shown (3 of 4)]
[im 1/1  full-range]
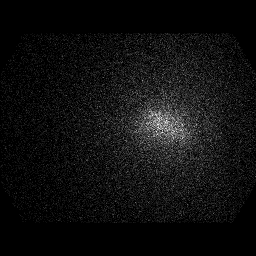

[Series 1: y-90 statics · 2.07mm/px · 1 of 1 slices shown (4 of 4)]
[im 1/1]
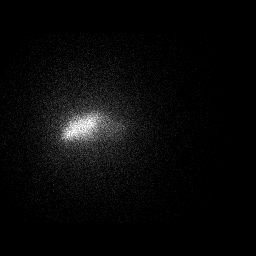

[Series 2: y-90 spect · 4.14mm/px · 6 of 64 frames shown (1 of 2)]
[frame 6/64  full-range]
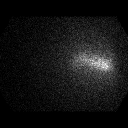
[frame 16/64  full-range]
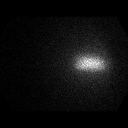
[frame 27/64  full-range]
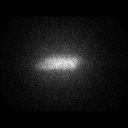
[frame 38/64  full-range]
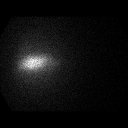
[frame 48/64  full-range]
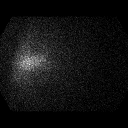
[frame 59/64  full-range]
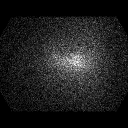

[Series 2: wbr_bone y-90 spect · 4.1mm · 4.14mm/px · 6 of 128 frames shown (1 of 2)]
[frame 11/128]
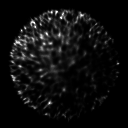
[frame 32/128]
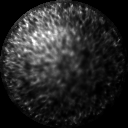
[frame 54/128]
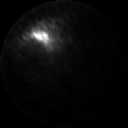
[frame 75/128]
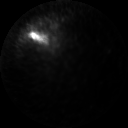
[frame 96/128]
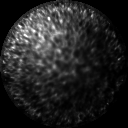
[frame 118/128]
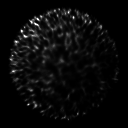

[Series 2: wbr_bone y-90 spect · 4.1mm · 4.14mm/px · 6 of 128 frames shown (2 of 2)]
[frame 11/128]
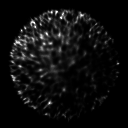
[frame 32/128]
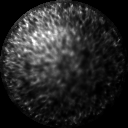
[frame 54/128]
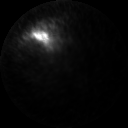
[frame 75/128]
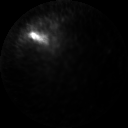
[frame 96/128]
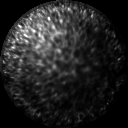
[frame 118/128]
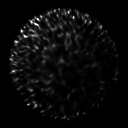

[Series 2: y-90 spect · 4.14mm/px · 6 of 64 frames shown (2 of 2)]
[frame 6/64  full-range]
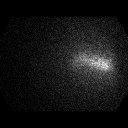
[frame 16/64  full-range]
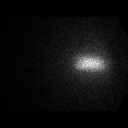
[frame 27/64  full-range]
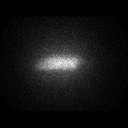
[frame 38/64  full-range]
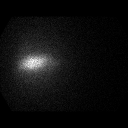
[frame 48/64  full-range]
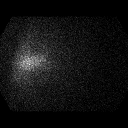
[frame 59/64  full-range]
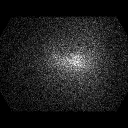

[28 of 28 positions shown; findings below may reference images not displayed]

FINDINGS: [AGE] microspheres therapy as above. First therapy the right
hepatic lobe.

Bremsstrahlung planar and SPECT imaging of the abdomen following
intrahepatic arterial delivery of Y-90 microsphere demonstrates
radioactivity localized to the left hepatic lobe. No evidence of
extrahepatic activity.
IMPRESSION: Successful [AGE] microsphere delivery for treatment of unresectable
liver metastasis. First therapy to the left lobe.

Bremssstrahlung scan demonstrates activity localized to left hepatic
lobe with no extrahepatic activity identified.

## 2020-05-22 IMAGING — NM NUCLEAR MEDICINE RADIO PHARM THERAPY INTRA ARTERIAL
4 series · 14 of 14 positions shown · non-contrast
Comparison: CT 06/18/2018.

CLINICAL DATA: Metastatic colon cancer. Unresectable liver
metastasis. Status post percutaneous micro wave ablation of lesions
within caudate and subcapsular aspect of right lobe of liver. First
treatment to left lobe.

EXAM:
NUCLEAR MEDICINE SPECIAL MED RAD PHYSICS CONS; NUCLEAR MEDICINE
RADIO PHARM THERAPY INTRA ARTERIAL; NUCLEAR MEDICINE TREATMENT
PROCEDURE; NUCLEAR MEDICINE LIVER SCAN
TECHNIQUE: In conjunction with the interventional radiologist a Y- Microsphere
dose was calculated utilizing body surface area formulation.
Calculated dose equal 13.5 mCi. Pre therapy MAA liver SPECT scan and
CTA were evaluated. Utilizing a microcatheter system, the hepatic
artery was selected and Y-90 microspheres were delivered in
fractionated aliquots. Radiopharmaceutical was delivered by the
interventional radiologist and nuclear radiologist.
The patient tolerated procedure well. No adverse effects were noted.
Bremsstrahlung planar and SPECT imaging of the abdomen following
intrahepatic arterial delivery of Y-90 microsphere was performed.
RADIOPHARMACEUTICALS:  9.7 millicuries Y- 90 microspheres

[Series 1: y-90 statics · 2.07mm/px · 1 of 1 slices shown (1 of 2)]
[im 1/1]
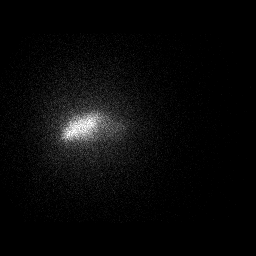

[Series 1: y-90 statics · 2.07mm/px · 1 of 1 slices shown (2 of 2)]
[im 1/1  full-range]
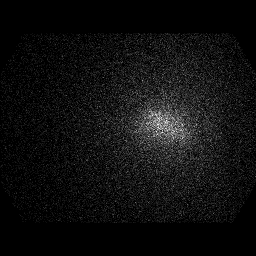

[Series 2: wbr_bone y-90 spect · 4.1mm · 4.14mm/px · 6 of 128 frames shown]
[frame 11/128]
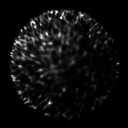
[frame 32/128]
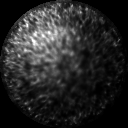
[frame 54/128]
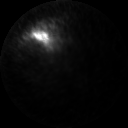
[frame 75/128]
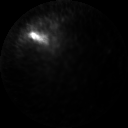
[frame 96/128]
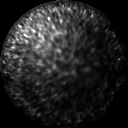
[frame 118/128]
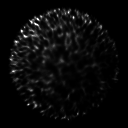

[Series 2: y-90 spect · 4.14mm/px · 6 of 64 frames shown]
[frame 6/64  full-range]
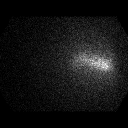
[frame 16/64  full-range]
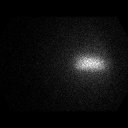
[frame 27/64  full-range]
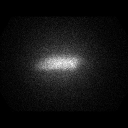
[frame 38/64  full-range]
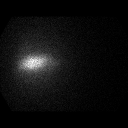
[frame 48/64  full-range]
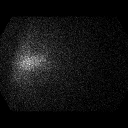
[frame 59/64  full-range]
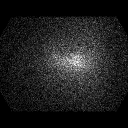

[14 of 14 positions shown; findings below may reference images not displayed]

FINDINGS: [AGE] microspheres therapy as above. First therapy the right
hepatic lobe.

Bremsstrahlung planar and SPECT imaging of the abdomen following
intrahepatic arterial delivery of Y-90 microsphere demonstrates
radioactivity localized to the left hepatic lobe. No evidence of
extrahepatic activity.
IMPRESSION: Successful [AGE] microsphere delivery for treatment of unresectable
liver metastasis. First therapy to the left lobe.

Bremssstrahlung scan demonstrates activity localized to left hepatic
lobe with no extrahepatic activity identified.

## 2020-05-25 ENCOUNTER — Inpatient Hospital Stay: Payer: Medicare HMO

## 2020-05-25 ENCOUNTER — Other Ambulatory Visit: Payer: Self-pay

## 2020-05-25 DIAGNOSIS — C189 Malignant neoplasm of colon, unspecified: Secondary | ICD-10-CM

## 2020-05-25 DIAGNOSIS — Z5112 Encounter for antineoplastic immunotherapy: Secondary | ICD-10-CM | POA: Diagnosis not present

## 2020-05-25 LAB — CBC WITH DIFFERENTIAL (CANCER CENTER ONLY)
Abs Immature Granulocytes: 0.15 10*3/uL — ABNORMAL HIGH (ref 0.00–0.07)
Basophils Absolute: 0 10*3/uL (ref 0.0–0.1)
Basophils Relative: 1 %
Eosinophils Absolute: 0 10*3/uL (ref 0.0–0.5)
Eosinophils Relative: 0 %
HCT: 36.2 % — ABNORMAL LOW (ref 39.0–52.0)
Hemoglobin: 11.6 g/dL — ABNORMAL LOW (ref 13.0–17.0)
Immature Granulocytes: 2 %
Lymphocytes Relative: 7 %
Lymphs Abs: 0.4 10*3/uL — ABNORMAL LOW (ref 0.7–4.0)
MCH: 30.3 pg (ref 26.0–34.0)
MCHC: 32 g/dL (ref 30.0–36.0)
MCV: 94.5 fL (ref 80.0–100.0)
Monocytes Absolute: 0.2 10*3/uL (ref 0.1–1.0)
Monocytes Relative: 4 %
Neutro Abs: 5.4 10*3/uL (ref 1.7–7.7)
Neutrophils Relative %: 86 %
Platelet Count: 68 10*3/uL — ABNORMAL LOW (ref 150–400)
RBC: 3.83 MIL/uL — ABNORMAL LOW (ref 4.22–5.81)
RDW: 18.7 % — ABNORMAL HIGH (ref 11.5–15.5)
WBC Count: 6.2 10*3/uL (ref 4.0–10.5)
nRBC: 1 % — ABNORMAL HIGH (ref 0.0–0.2)

## 2020-05-25 LAB — PROTIME-INR
INR: 1.9 — ABNORMAL HIGH (ref 0.8–1.2)
Prothrombin Time: 22 seconds — ABNORMAL HIGH (ref 11.4–15.2)

## 2020-05-25 LAB — CMP (CANCER CENTER ONLY)
ALT: 47 U/L — ABNORMAL HIGH (ref 0–44)
AST: 46 U/L — ABNORMAL HIGH (ref 15–41)
Albumin: 3.2 g/dL — ABNORMAL LOW (ref 3.5–5.0)
Alkaline Phosphatase: 221 U/L — ABNORMAL HIGH (ref 38–126)
Anion gap: 4 — ABNORMAL LOW (ref 5–15)
BUN: 20 mg/dL (ref 8–23)
CO2: 35 mmol/L — ABNORMAL HIGH (ref 22–32)
Calcium: 9.2 mg/dL (ref 8.9–10.3)
Chloride: 102 mmol/L (ref 98–111)
Creatinine: 0.98 mg/dL (ref 0.61–1.24)
GFR, Estimated: >60 mL/min (ref >60)
Glucose, Bld: 147 mg/dL — ABNORMAL HIGH (ref 70–99)
Potassium: 4.2 mmol/L (ref 3.5–5.1)
Sodium: 141 mmol/L (ref 135–145)
Total Bilirubin: 2.8 mg/dL — ABNORMAL HIGH (ref 0.3–1.2)
Total Protein: 5.5 g/dL — ABNORMAL LOW (ref 6.5–8.1)

## 2020-05-26 ENCOUNTER — Other Ambulatory Visit: Payer: Medicare HMO

## 2020-05-26 ENCOUNTER — Other Ambulatory Visit: Payer: Self-pay | Admitting: Hematology & Oncology

## 2020-05-26 DIAGNOSIS — I10 Essential (primary) hypertension: Secondary | ICD-10-CM

## 2020-05-28 ENCOUNTER — Telehealth: Payer: Self-pay

## 2020-05-28 NOTE — Telephone Encounter (Signed)
Per Dr Marin Olp, pt instructed to remain on current Coumadin schedule of M,T,Th, Sa 2mg  alternating with 4mg  WFSu. Pt is already scheduled for lab next week. Pt verbalizes understanding using teachback. dph

## 2020-05-28 NOTE — Telephone Encounter (Signed)
-----   Message from [name], MD sent at 05/25/2020  5:47 PM EDT ----- Call - the INR is 1.9.  How much coumadin is he on? [name]

## 2020-06-03 ENCOUNTER — Other Ambulatory Visit: Payer: Self-pay

## 2020-06-03 ENCOUNTER — Inpatient Hospital Stay (HOSPITAL_BASED_OUTPATIENT_CLINIC_OR_DEPARTMENT_OTHER): Payer: Medicare HMO | Admitting: Hematology & Oncology

## 2020-06-03 ENCOUNTER — Inpatient Hospital Stay: Payer: Medicare HMO

## 2020-06-03 ENCOUNTER — Encounter: Payer: Self-pay | Admitting: Hematology & Oncology

## 2020-06-03 ENCOUNTER — Telehealth: Payer: Self-pay

## 2020-06-03 ENCOUNTER — Inpatient Hospital Stay: Payer: Medicare HMO | Attending: Hematology & Oncology

## 2020-06-03 VITALS — BP 132/84 | HR 81 | Temp 98.2°F | Resp 20 | Wt 256.0 lb

## 2020-06-03 DIAGNOSIS — C189 Malignant neoplasm of colon, unspecified: Secondary | ICD-10-CM

## 2020-06-03 DIAGNOSIS — D693 Immune thrombocytopenic purpura: Secondary | ICD-10-CM

## 2020-06-03 DIAGNOSIS — Z79899 Other long term (current) drug therapy: Secondary | ICD-10-CM | POA: Insufficient documentation

## 2020-06-03 DIAGNOSIS — Z5112 Encounter for antineoplastic immunotherapy: Secondary | ICD-10-CM | POA: Insufficient documentation

## 2020-06-03 DIAGNOSIS — C787 Secondary malignant neoplasm of liver and intrahepatic bile duct: Secondary | ICD-10-CM | POA: Diagnosis not present

## 2020-06-03 LAB — CBC WITH DIFFERENTIAL (CANCER CENTER ONLY)
Abs Immature Granulocytes: 0.03 10*3/uL (ref 0.00–0.07)
Basophils Absolute: 0 10*3/uL (ref 0.0–0.1)
Basophils Relative: 0 %
Eosinophils Absolute: 0.1 10*3/uL (ref 0.0–0.5)
Eosinophils Relative: 3 %
HCT: 32.6 % — ABNORMAL LOW (ref 39.0–52.0)
Hemoglobin: 10.8 g/dL — ABNORMAL LOW (ref 13.0–17.0)
Immature Granulocytes: 1 %
Lymphocytes Relative: 17 %
Lymphs Abs: 0.7 10*3/uL (ref 0.7–4.0)
MCH: 30.4 pg (ref 26.0–34.0)
MCHC: 33.1 g/dL (ref 30.0–36.0)
MCV: 91.8 fL (ref 80.0–100.0)
Monocytes Absolute: 0.2 10*3/uL (ref 0.1–1.0)
Monocytes Relative: 6 %
Neutro Abs: 2.9 10*3/uL (ref 1.7–7.7)
Neutrophils Relative %: 73 %
Platelet Count: 67 10*3/uL — ABNORMAL LOW (ref 150–400)
RBC: 3.55 MIL/uL — ABNORMAL LOW (ref 4.22–5.81)
RDW: 18.6 % — ABNORMAL HIGH (ref 11.5–15.5)
WBC Count: 3.9 10*3/uL — ABNORMAL LOW (ref 4.0–10.5)
nRBC: 4.3 % — ABNORMAL HIGH (ref 0.0–0.2)

## 2020-06-03 LAB — CMP (CANCER CENTER ONLY)
ALT: 40 U/L (ref 0–44)
AST: 36 U/L (ref 15–41)
Albumin: 3.2 g/dL — ABNORMAL LOW (ref 3.5–5.0)
Alkaline Phosphatase: 218 U/L — ABNORMAL HIGH (ref 38–126)
Anion gap: 6 (ref 5–15)
BUN: 23 mg/dL (ref 8–23)
CO2: 31 mmol/L (ref 22–32)
Calcium: 8.9 mg/dL (ref 8.9–10.3)
Chloride: 107 mmol/L (ref 98–111)
Creatinine: 0.99 mg/dL (ref 0.61–1.24)
GFR, Estimated: >60 mL/min (ref >60)
Glucose, Bld: 105 mg/dL — ABNORMAL HIGH (ref 70–99)
Potassium: 3.9 mmol/L (ref 3.5–5.1)
Sodium: 144 mmol/L (ref 135–145)
Total Bilirubin: 2.3 mg/dL — ABNORMAL HIGH (ref 0.3–1.2)
Total Protein: 5.3 g/dL — ABNORMAL LOW (ref 6.5–8.1)

## 2020-06-03 LAB — LACTATE DEHYDROGENASE: LDH: 447 U/L — ABNORMAL HIGH (ref 98–192)

## 2020-06-03 LAB — TOTAL PROTEIN, URINE DIPSTICK: Protein, ur: NEGATIVE mg/dL

## 2020-06-03 LAB — PROTIME-INR
INR: 2.5 — ABNORMAL HIGH (ref 0.8–1.2)
Prothrombin Time: 27.2 seconds — ABNORMAL HIGH (ref 11.4–15.2)

## 2020-06-03 LAB — CEA (IN HOUSE-CHCC): CEA (CHCC-In House): 1401.31 ng/mL — ABNORMAL HIGH (ref 0.00–5.00)

## 2020-06-03 MED ORDER — SODIUM CHLORIDE 0.9 % IV SOLN
Freq: Once | INTRAVENOUS | Status: AC
Start: 2020-06-03 — End: 2020-06-03
  Filled 2020-06-03: qty 250

## 2020-06-03 MED ORDER — HEPARIN SOD (PORK) LOCK FLUSH 100 UNIT/ML IV SOLN
500.0000 [IU] | Freq: Once | INTRAVENOUS | Status: AC | PRN
  Administered 2020-06-03: 500 [IU]
  Filled 2020-06-03: qty 5

## 2020-06-03 MED ORDER — SODIUM CHLORIDE 0.9 % IV SOLN
7.5000 mg/kg | Freq: Once | INTRAVENOUS | Status: AC
  Administered 2020-06-03: 800 mg via INTRAVENOUS
  Filled 2020-06-03: qty 32

## 2020-06-03 MED ORDER — SODIUM CHLORIDE 0.9% FLUSH
10.0000 mL | INTRAVENOUS | Status: DC | PRN
  Administered 2020-06-03: 10 mL
  Filled 2020-06-03: qty 10

## 2020-06-03 MED ORDER — METOLAZONE 5 MG PO TABS: 5.0000 mg | ORAL_TABLET | Freq: Every day | ORAL | 4 refills | Status: DC

## 2020-06-03 NOTE — Telephone Encounter (Signed)
appts made and printed for pt per 06/03/20 los   [employer]

## 2020-06-03 NOTE — Patient Instructions (Signed)
[facility] [place] [place]  Discharge Instructions: Thank you for choosing [facility] to provide your oncology and hematology care.   If you have a lab appointment with the [facility], please go directly to the [facility] and check in at the registration area.  Wear comfortable clothing and clothing appropriate for easy access to any Portacath or PICC line.   We strive to give you quality time with your provider. You may need to reschedule your appointment if you arrive late (15 or more minutes).  Arriving late affects you and other patients whose appointments are after yours.  Also, if you miss three or more appointments without notifying the office, you may be dismissed from the clinic at the provider's discretion.      For prescription refill requests, have your pharmacy contact our office and allow 72 hours for refills to be completed.    Today you received the following chemotherapy and/or immunotherapy agents Bevacizumab       To help prevent nausea and vomiting after your treatment, we encourage you to take your nausea medication as directed.  BELOW ARE SYMPTOMS THAT SHOULD BE REPORTED IMMEDIATELY: . *FEVER GREATER THAN 100.4 F (38 C) OR HIGHER . *CHILLS OR SWEATING . *NAUSEA AND VOMITING THAT IS NOT CONTROLLED WITH YOUR NAUSEA MEDICATION . *UNUSUAL SHORTNESS OF BREATH . *UNUSUAL BRUISING OR BLEEDING . *URINARY PROBLEMS (pain or burning when urinating, or frequent urination) . *BOWEL PROBLEMS (unusual diarrhea, constipation, pain near the anus) . TENDERNESS IN MOUTH AND THROAT WITH OR WITHOUT PRESENCE OF ULCERS (sore throat, sores in mouth, or a toothache) . UNUSUAL RASH, SWELLING OR PAIN  . UNUSUAL VAGINAL DISCHARGE OR ITCHING   Items with * indicate a potential emergency and should be followed up as soon as possible or go to the Emergency Department if any problems should occur.  Please show the CHEMOTHERAPY ALERT CARD or IMMUNOTHERAPY ALERT  CARD at check-in to the Emergency Department and triage nurse. Should you have questions after your visit or need to cancel or reschedule your appointment, please contact [facility]  [phone] and follow the prompts.  Office hours are 8:00 a.m. to 4:30 p.m. Monday - Friday. Please note that voicemails left after 4:00 p.m. may not be returned until the following business day.  We are closed weekends and major holidays. You have access to a nurse at all times for urgent questions. Please call the main number to the clinic [phone] and follow the prompts.  For any non-urgent questions, you may also contact your provider using MyChart. We now offer e-Visits for anyone 81 and older to request care online for non-urgent symptoms. For details visit [email or web address].   Also download the MyChart app! Go to the app store, search "MyChart", open the app, select [facility], and log in with your MyChart username and password.  Due to Covid, a mask is required upon entering the hospital/clinic. If you do not have a mask, one will be given to you upon arrival. For doctor visits, patients may have 1 support person aged 57 or older with them. For treatment visits, patients cannot have anyone with them due to current Covid guidelines and our immunocompromised population.

## 2020-06-03 NOTE — Progress Notes (Signed)
Hematology and Oncology Follow Up Visit  Zachary Willis [id number] 14-Feb-1945 75 y.o. 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  Thankfully, his left leg wound looks much better.  However, there is a lot of swelling in his legs.  It is hard to say why he has this.  It certainly could be from his cardiac issue.  Could be from the [place]..  I do think that he probably will need some Zaroxolyn.  He is on Lasix.  I told him to do Zaroxolyn 5 mg 1 hour before Lasix which will be 40 mg.  His last CEA level was down to 919.  Hopefully, we will see continued response.  His last INR today was 2.5.  I told him to take Coumadin 2 mg 2 days in a row and then 4 mg 1 day.  He is eating okay.  He has had no problems with nausea or vomiting.  He seems to be going to the bathroom okay.  He may have little bit of constipation.  He has had no chest pain.  He has had no issues with atrial fibrillation.  There is been no problems with headache.  He and his wife had a very quite  Mem[employer] Day weekend.  Overall, his performance status is ECOG 2.    Medications:  Allergies as of 06/03/2020   No Known Allergies     Medication List       Accurate as of June 03, 2020  8:51 AM. If you have any questions, ask your nurse or doctor.        allopurinol 300 MG tablet Commonly known as: ZYLOPRIM Take 300 mg by mouth daily.   atorvastatin 40 MG tablet Commonly known as: LIPITOR Take 40 mg by mouth daily.   carvedilol  6.25 MG tablet Commonly known as: COREG Take 6.25 mg by mouth 2 (two) times daily with a meal.   ferrous sulfate 325 (65 FE) MG tablet Take 325 mg by mouth daily.   furosemide 40 MG tablet Commonly known as: LASIX Take 20 mg by mouth every other day.   HYDROcodone bit-homatropine 5-1.5 MG/5ML syrup Commonly known as: HYCODAN Take 5 mLs by mouth every 6 (six) hours as needed for cough.   hydrocortisone 10 MG tablet Commonly known as: CORTEF Take 1-2 tablets (10-20 mg total) by mouth See admin instructions. 20mg  in AM 10mg  in PM   ibuprofen 200 MG tablet Commonly known as: ADVIL Take 400 mg by mouth every 6 (six) hours as needed for headache or mild pain.   levothyroxine 88 MCG tablet Commonly known as: SYNTHROID Take 88 mcg by mouth daily before breakfast.   lidocaine-prilocaine cream Commonly known as: EMLA Apply to affected area once   Lonsurf 15-6.14 MG tablet Generic drug: trifluridine-tipiracil Take 5 tablets (75 mg of trifluridine total) by mouth 2 (two) times daily after a meal. 1 hr after AM & PM meals on days 1-5, 8-12. Repeat every 28 days. Take 1 hr after morning and evening meals.  NASAL [place] NA Place 1 spray into the nose daily.   ondansetron 8 MG tablet Commonly known as: Zofran Take 1 tablet (8 mg total) by mouth every 4 (four) hours as needed for nausea.   Ozempic (0.25 or 0.5 MG/DOSE) 2 MG/1.5ML Sopn Generic drug: Semaglutide(0.25 or 0.5MG /DOS) Inject into the skin.   pantoprazole 40 MG tablet Commonly known as: PROTONIX Take 40 mg by mouth every morning.   polyvinyl alcohol 1.4 % ophthalmic solution Commonly known as: LIQUIFILM TEARS Place 1 drop into both eyes daily as needed for dry eyes.   Potassium Chloride ER 20 MEQ Tbcr Take 20 mEq by mouth daily.   telmisartan 80 MG tablet Commonly known as: MICARDIS TAKE 1 TABLET BY MOUTH EVERY DAY   testosterone 4 MG/24HR Pt24 patch Commonly known as: ANDRODERM Place 1 patch onto the skin  daily.   warfarin 4 MG tablet Commonly known as: Coumadin Take 1 tablet (4 mg total) by mouth daily. What changed: how much to take       Allergies: No Known Allergies  Past Medical History, Surgical history, Social history, and Family History were reviewed and updated.  Review of Systems: Review of Systems  Constitutional: Negative.   HENT: Negative.   Eyes: Negative.   Respiratory: Negative.   Cardiovascular: Negative.   Gastrointestinal: Negative.   Genitourinary: Negative.   Musculoskeletal: Negative.   Skin: Negative.   Neurological: Negative.   Endo/Heme/Allergies: Negative.   Psychiatric/Behavioral: Negative.       Physical Exam:  vitals were not taken for this visit.   Wt Readings from Last 3 Encounters:  05/12/20 252 lb (114.3 kg)  04/29/20 248 lb (112.5 kg)  04/09/20 245 lb (111.1 kg)    Physical Exam Vitals reviewed.  HENT:     Head: Normocephalic and atraumatic.  Eyes:     Pupils: Pupils are equal, round, and reactive to light.  Cardiovascular:     Rate and Rhythm: Normal rate and regular rhythm.     Heart sounds: Normal heart sounds.     Comments: Cardiac exam shows a regular rate and rhythm.  There is some occasional extra beat.  I do not hear any obvious atrial fibrillation. Pulmonary:     Effort: Pulmonary effort is normal.     Breath sounds: Normal breath sounds.  Abdominal:     General: Bowel sounds are normal.     Palpations: Abdomen is soft.     Comments: Abdominal exam shows laparotomy scars.  He has no fluid wave.  He has no guarding or rebound tenderness.  There is no palpable liver or spleen tip.  Musculoskeletal:        General: No tenderness or deformity. Normal range of motion.     Cervical back: Normal range of motion.     Comments: On the anterior aspect of the left lower leg, he does have the wound that is healing.  There is an eschar.  There is mild edema.  No erythema is noted.  Lymphadenopathy:     Cervical: No cervical  adenopathy.  Skin:    General: Skin is warm and dry.     Findings: No erythema or rash.  Neurological:     Mental Status: He is alert and oriented to person, place, and time.  Psychiatric:        Behavior: Behavior normal.        Thought Content: Thought content normal.        Judgment: Judgment normal.    Lab  Results  Component Value Date   WBC 6.2 05/25/2020   HGB 11.6 (L) 05/25/2020   HCT 36.2 (L) 05/25/2020   MCV 94.5 05/25/2020   PLT 68 (L) 05/25/2020   Lab Results  Component Value Date   FERRITIN 239 04/09/2020   IRON 76 04/09/2020   TIBC 209 04/09/2020   UIBC 132 04/09/2020   IRONPCTSAT 37 04/09/2020   Lab Results  Component Value Date   RETICCTPCT 1.3 04/09/2020   RBC 3.83 (L) 05/25/2020   No results found for: KPAFRELGTCHN, LAMBDASER, KAPLAMBRATIO No results found for: IGGSERUM, IGA, IGMSERUM No results found for: Odetta Pink, SPEI   Chemistry      Component Value Date/Time   NA 141 05/25/2020 0849   K 4.2 05/25/2020 0849   CL 102 05/25/2020 0849   CO2 35 (H) 05/25/2020 0849   BUN 20 05/25/2020 0849   CREATININE 0.98 05/25/2020 0849      Component Value Date/Time   CALCIUM 9.2 05/25/2020 0849   ALKPHOS 221 (H) 05/25/2020 0849   AST 46 (H) 05/25/2020 0849   ALT Zach[facility] (H) 05/25/2020 0849   BILITOT 2.8 (H) 05/25/2020 0849      Impression and Plan: [facility] is a very pleasant [facility]ary Willis with metastatic colon cancer.  He now is on Lonsurf/Avastin combination.  He has completed his first 3 cycles of the Lonsurf.    I really do hope that the CEA is level is better.  I know that this is certainly tenuous.  He seems to be doing fairly well with the [place].  We will plan to get him back in another 3 weeks or so.  He will get his Avastin today.    I am not too worried about the platelet count.  He just completed the Lonsurf and he has 2 weeks off right now.        [name],  MD 6/1/20228:51 AM

## 2020-06-03 NOTE — Addendum Note (Signed)
Addended by: Melton Krebs on: 06/03/2020 01:19 PM   Modules accepted: Orders

## 2020-06-04 ENCOUNTER — Telehealth: Payer: Self-pay | Admitting: *Deleted

## 2020-06-04 NOTE — Telephone Encounter (Signed)
Call received from patient's wife concerned about patient's elevated CEA level that she viewed on MyChart.  Dr. Marin Olp notified.  Informed [name] per order of Dr. Marin Olp that Dr. Marin Olp is going to order CT scans for patient due to elevated CEA.  [facility] is appreciative of assistance and has no further concerns at this time.

## 2020-06-04 NOTE — Addendum Note (Signed)
Addended by: Burney Gauze R on: 06/04/2020 04:57 PM   Modules accepted: Orders

## 2020-06-08 ENCOUNTER — Other Ambulatory Visit (HOSPITAL_COMMUNITY): Payer: Self-pay

## 2020-06-11 ENCOUNTER — Inpatient Hospital Stay: Payer: Medicare HMO

## 2020-06-11 ENCOUNTER — Other Ambulatory Visit (HOSPITAL_COMMUNITY): Payer: Self-pay

## 2020-06-11 ENCOUNTER — Encounter (HOSPITAL_BASED_OUTPATIENT_CLINIC_OR_DEPARTMENT_OTHER): Payer: Self-pay

## 2020-06-11 ENCOUNTER — Ambulatory Visit: Payer: Medicare HMO | Attending: Internal Medicine

## 2020-06-11 ENCOUNTER — Encounter: Payer: Self-pay | Admitting: Hematology & Oncology

## 2020-06-11 ENCOUNTER — Other Ambulatory Visit (HOSPITAL_BASED_OUTPATIENT_CLINIC_OR_DEPARTMENT_OTHER): Payer: Self-pay

## 2020-06-11 ENCOUNTER — Other Ambulatory Visit: Payer: Self-pay

## 2020-06-11 ENCOUNTER — Telehealth: Payer: Self-pay | Admitting: *Deleted

## 2020-06-11 ENCOUNTER — Ambulatory Visit (HOSPITAL_BASED_OUTPATIENT_CLINIC_OR_DEPARTMENT_OTHER)
Admission: RE | Admit: 2020-06-11 | Discharge: 2020-06-11 | Disposition: A | Discharge status: Home / Self Care | Payer: Medicare HMO | Source: Ambulatory Visit | Attending: Hematology & Oncology | Admitting: Hematology & Oncology

## 2020-06-11 DIAGNOSIS — Z23 Encounter for immunization: Secondary | ICD-10-CM

## 2020-06-11 DIAGNOSIS — C787 Secondary malignant neoplasm of liver and intrahepatic bile duct: Secondary | ICD-10-CM | POA: Diagnosis present

## 2020-06-11 DIAGNOSIS — C189 Malignant neoplasm of colon, unspecified: Secondary | ICD-10-CM | POA: Insufficient documentation

## 2020-06-11 DIAGNOSIS — Z5112 Encounter for antineoplastic immunotherapy: Secondary | ICD-10-CM | POA: Diagnosis not present

## 2020-06-11 LAB — CBC WITH DIFFERENTIAL (CANCER CENTER ONLY)
Abs Immature Granulocytes: 0.04 10*3/uL (ref 0.00–0.07)
Basophils Absolute: 0 10*3/uL (ref 0.0–0.1)
Basophils Relative: 1 %
Eosinophils Absolute: 0 10*3/uL (ref 0.0–0.5)
Eosinophils Relative: 3 %
HCT: 34.9 % — ABNORMAL LOW (ref 39.0–52.0)
Hemoglobin: 11.1 g/dL — ABNORMAL LOW (ref 13.0–17.0)
Immature Granulocytes: 3 %
Lymphocytes Relative: 13 %
Lymphs Abs: 0.2 10*3/uL — ABNORMAL LOW (ref 0.7–4.0)
MCH: 30.8 pg (ref 26.0–34.0)
MCHC: 31.8 g/dL (ref 30.0–36.0)
MCV: 96.9 fL (ref 80.0–100.0)
Monocytes Absolute: 0.3 10*3/uL (ref 0.1–1.0)
Monocytes Relative: 20 %
Neutro Abs: 0.9 10*3/uL — ABNORMAL LOW (ref 1.7–7.7)
Neutrophils Relative %: 60 %
Platelet Count: 96 10*3/uL — ABNORMAL LOW (ref 150–400)
RBC: 3.6 MIL/uL — ABNORMAL LOW (ref 4.22–5.81)
RDW: 22.1 % — ABNORMAL HIGH (ref 11.5–15.5)
WBC Count: 1.5 10*3/uL — ABNORMAL LOW (ref 4.0–10.5)
nRBC: 1.3 % — ABNORMAL HIGH (ref 0.0–0.2)

## 2020-06-11 LAB — CMP (CANCER CENTER ONLY)
ALT: 43 U/L (ref 0–44)
AST: 38 U/L (ref 15–41)
Albumin: 3.2 g/dL — ABNORMAL LOW (ref 3.5–5.0)
Alkaline Phosphatase: 209 U/L — ABNORMAL HIGH (ref 38–126)
Anion gap: 7 (ref 5–15)
BUN: 21 mg/dL (ref 8–23)
CO2: 29 mmol/L (ref 22–32)
Calcium: 8.6 mg/dL — ABNORMAL LOW (ref 8.9–10.3)
Chloride: 107 mmol/L (ref 98–111)
Creatinine: 1.28 mg/dL — ABNORMAL HIGH (ref 0.61–1.24)
GFR, Estimated: 59 mL/min — ABNORMAL LOW (ref >60)
Glucose, Bld: 117 mg/dL — ABNORMAL HIGH (ref 70–99)
Potassium: 3.6 mmol/L (ref 3.5–5.1)
Sodium: 143 mmol/L (ref 135–145)
Total Bilirubin: 3.1 mg/dL — ABNORMAL HIGH (ref 0.3–1.2)
Total Protein: 5.4 g/dL — ABNORMAL LOW (ref 6.5–8.1)

## 2020-06-11 LAB — PROTIME-INR
INR: 2.3 — ABNORMAL HIGH (ref 0.8–1.2)
Prothrombin Time: 25.2 seconds — ABNORMAL HIGH (ref 11.4–15.2)

## 2020-06-11 MED ORDER — IOHEXOL 300 MG/ML  SOLN
75.0000 mL | Freq: Once | INTRAMUSCULAR | Status: AC | PRN
  Administered 2020-06-11: 75 mL via INTRAVENOUS

## 2020-06-11 MED ORDER — COVID-19 MRNA VAC-TRIS(PFIZER) 30 MCG/0.3ML IM SUSP
INTRAMUSCULAR | 0 refills | Status: DC
  Filled 2020-06-11: qty 0.3, 1d supply, fill #0

## 2020-06-11 NOTE — Telephone Encounter (Addendum)
-----   Message from [name], MD sent at 06/11/2020  1:24 PM EDT ----- Called patient to let him know that the INR is perfect.  Do not change the Coumadin dose.  Thanks.  [name]

## 2020-06-11 NOTE — Progress Notes (Signed)
   Covid-19 Vaccination Clinic  Name:  AHMAD VANWEY    MRN: [id number] DOB: 06-23-1945  06/11/2020  Mr. Vasil was observed post Covid-19 immunization for 15 minutes without incident. He was provided with Vaccine Information Sheet and instruction to access the V-Safe system.   Mr. Mehringer was instructed to call 911 with any severe reactions post vaccine: Difficulty breathing  Swelling of face and throat  A fast heartbeat  A bad rash all over body  Dizziness and weakness   Immunizations Administered     Name Date Dose VIS Date Route   PFIZER Comrnaty(Gray TOP) Covid-19 Vaccine 06/11/2020 10:38 AM 0.3 mL 12/12/2019 Intramuscular   Manufacturer: [place]   Lot: [id number]   [place]: [phone]

## 2020-06-12 ENCOUNTER — Telehealth: Payer: Self-pay | Admitting: *Deleted

## 2020-06-12 ENCOUNTER — Other Ambulatory Visit: Payer: Self-pay | Admitting: Hematology & Oncology

## 2020-06-12 ENCOUNTER — Other Ambulatory Visit (HOSPITAL_COMMUNITY): Payer: Self-pay

## 2020-06-12 ENCOUNTER — Telehealth: Payer: Self-pay

## 2020-06-12 DIAGNOSIS — C189 Malignant neoplasm of colon, unspecified: Secondary | ICD-10-CM

## 2020-06-12 NOTE — Telephone Encounter (Signed)
Patient wife Zachary Willis called wanting to receive CT scan results when they are resulted and asked whether they are to start the [place] on Monday as originally planned.  Results and question given to Dr Marin Olp.  No orders given at this time.

## 2020-06-12 NOTE — Progress Notes (Signed)
I spoke to Mrs. Hardie on the phone today.  I told her about the results of the CT scan.  We are seeing clear progression of malignancy on the scan.  It has been 3 years since he has had a biopsy.  I think we should get another biopsy and try to send it off for molecular markers.  Hopefully, we can get multiple biopsies so that there will be enough material for molecular markers.  We really have exhausted standard chemotherapy.  Maybe, if we find that there is a target that we can go after, then we can try for further therapy.  If we consider any kind of clinical trial, he will need to have a recent biopsy.  I  told his wife to make sure he does not take Coumadin.  We will check his labs on Monday.  We will hopefully get a biopsy on Tuesday.  I told her to give [place] a call if there are any problems.    Lattie Haw, MD

## 2020-06-15 ENCOUNTER — Other Ambulatory Visit: Payer: Self-pay | Admitting: *Deleted

## 2020-06-15 ENCOUNTER — Other Ambulatory Visit: Payer: Self-pay

## 2020-06-15 ENCOUNTER — Inpatient Hospital Stay: Payer: Medicare HMO

## 2020-06-15 ENCOUNTER — Telehealth: Payer: Self-pay | Admitting: *Deleted

## 2020-06-15 DIAGNOSIS — C189 Malignant neoplasm of colon, unspecified: Secondary | ICD-10-CM

## 2020-06-15 DIAGNOSIS — E860 Dehydration: Secondary | ICD-10-CM

## 2020-06-15 DIAGNOSIS — N200 Calculus of kidney: Secondary | ICD-10-CM

## 2020-06-15 DIAGNOSIS — R350 Frequency of micturition: Secondary | ICD-10-CM

## 2020-06-15 DIAGNOSIS — C787 Secondary malignant neoplasm of liver and intrahepatic bile duct: Secondary | ICD-10-CM

## 2020-06-15 DIAGNOSIS — D649 Anemia, unspecified: Secondary | ICD-10-CM

## 2020-06-15 DIAGNOSIS — N390 Urinary tract infection, site not specified: Secondary | ICD-10-CM

## 2020-06-15 DIAGNOSIS — Z5112 Encounter for antineoplastic immunotherapy: Secondary | ICD-10-CM | POA: Diagnosis not present

## 2020-06-15 DIAGNOSIS — N342 Other urethritis: Secondary | ICD-10-CM

## 2020-06-15 DIAGNOSIS — E876 Hypokalemia: Secondary | ICD-10-CM

## 2020-06-15 HISTORY — DX: Urinary tract infection, site not specified: N39.0

## 2020-06-15 LAB — CMP (CANCER CENTER ONLY)
ALT: 34 U/L (ref 0–44)
AST: 34 U/L (ref 15–41)
Albumin: 3.2 g/dL — ABNORMAL LOW (ref 3.5–5.0)
Alkaline Phosphatase: 207 U/L — ABNORMAL HIGH (ref 38–126)
Anion gap: 7 (ref 5–15)
BUN: 23 mg/dL (ref 8–23)
CO2: 35 mmol/L — ABNORMAL HIGH (ref 22–32)
Calcium: 9 mg/dL (ref 8.9–10.3)
Chloride: 93 mmol/L — ABNORMAL LOW (ref 98–111)
Creatinine: 1.2 mg/dL (ref 0.61–1.24)
GFR, Estimated: >60 mL/min (ref >60)
Glucose, Bld: 132 mg/dL — ABNORMAL HIGH (ref 70–99)
Potassium: 2.8 mmol/L — ABNORMAL LOW (ref 3.5–5.1)
Sodium: 135 mmol/L (ref 135–145)
Total Bilirubin: 2.6 mg/dL — ABNORMAL HIGH (ref 0.3–1.2)
Total Protein: 5.7 g/dL — ABNORMAL LOW (ref 6.5–8.1)

## 2020-06-15 LAB — CBC WITH DIFFERENTIAL (CANCER CENTER ONLY)
Abs Immature Granulocytes: 0.07 10*3/uL (ref 0.00–0.07)
Basophils Absolute: 0 10*3/uL (ref 0.0–0.1)
Basophils Relative: 1 %
Eosinophils Absolute: 0 10*3/uL (ref 0.0–0.5)
Eosinophils Relative: 1 %
HCT: 35.4 % — ABNORMAL LOW (ref 39.0–52.0)
Hemoglobin: 11.6 g/dL — ABNORMAL LOW (ref 13.0–17.0)
Immature Granulocytes: 2 %
Lymphocytes Relative: 13 %
Lymphs Abs: 0.4 10*3/uL — ABNORMAL LOW (ref 0.7–4.0)
MCH: 30.7 pg (ref 26.0–34.0)
MCHC: 32.8 g/dL (ref 30.0–36.0)
MCV: 93.7 fL (ref 80.0–100.0)
Monocytes Absolute: 0.3 10*3/uL (ref 0.1–1.0)
Monocytes Relative: 9 %
Neutro Abs: 2.4 10*3/uL (ref 1.7–7.7)
Neutrophils Relative %: 74 %
Platelet Count: 101 10*3/uL — ABNORMAL LOW (ref 150–400)
RBC: 3.78 MIL/uL — ABNORMAL LOW (ref 4.22–5.81)
RDW: 19.8 % — ABNORMAL HIGH (ref 11.5–15.5)
WBC Count: 3.3 10*3/uL — ABNORMAL LOW (ref 4.0–10.5)
nRBC: 1.2 % — ABNORMAL HIGH (ref 0.0–0.2)

## 2020-06-15 LAB — PROTIME-INR
INR: 2.4 — ABNORMAL HIGH (ref 0.8–1.2)
Prothrombin Time: 26 seconds — ABNORMAL HIGH (ref 11.4–15.2)

## 2020-06-15 LAB — URINALYSIS, COMPLETE (UACMP) WITH MICROSCOPIC
Glucose, UA: NEGATIVE mg/dL
Ketones, ur: NEGATIVE mg/dL
Nitrite: POSITIVE — AB
Protein, ur: >300 mg/dL — AB
RBC / HPF: >50 RBC/hpf (ref 0–5)
Specific Gravity, Urine: 1.02 (ref 1.005–1.030)
WBC, UA: >50 WBC/hpf (ref 0–5)
pH: 6.5 (ref 5.0–8.0)

## 2020-06-15 MED ORDER — POTASSIUM CHLORIDE CRYS ER 20 MEQ PO TBCR
EXTENDED_RELEASE_TABLET | ORAL | Status: AC
  Filled 2020-06-15: qty 4

## 2020-06-15 MED ORDER — HEPARIN SOD (PORK) LOCK FLUSH 100 UNIT/ML IV SOLN
500.0000 [IU] | Freq: Once | INTRAVENOUS | Status: AC
  Administered 2020-06-15: 500 [IU] via INTRAVENOUS
  Filled 2020-06-15: qty 5

## 2020-06-15 MED ORDER — CIPROFLOXACIN HCL 500 MG PO TABS: 500.0000 mg | ORAL_TABLET | Freq: Two times a day (BID) | ORAL | 0 refills | Status: DC

## 2020-06-15 MED ORDER — SODIUM CHLORIDE 0.9 % IV SOLN
Freq: Once | INTRAVENOUS | Status: AC
Start: 2020-06-15 — End: 2020-06-15
  Filled 2020-06-15: qty 250

## 2020-06-15 MED ORDER — POTASSIUM CHLORIDE CRYS ER 20 MEQ PO TBCR
40.0000 meq | EXTENDED_RELEASE_TABLET | Freq: Two times a day (BID) | ORAL | Status: DC
  Administered 2020-06-15 (×2): 40 meq via ORAL

## 2020-06-15 MED ORDER — SODIUM CHLORIDE 0.9% FLUSH
10.0000 mL | INTRAVENOUS | Status: DC | PRN
  Administered 2020-06-15: 10 mL via INTRAVENOUS
  Filled 2020-06-15: qty 10

## 2020-06-15 NOTE — Patient Instructions (Signed)

## 2020-06-15 NOTE — Telephone Encounter (Signed)
Call received from patient's wife stating that pt has "been feeling weak and has burning with urination."  Informed [name] to wait in lobby for lab results today and that urine would be added on for pt.  Meredith Mody is appreciative of assistance and states they can be here at 0900 this morning.  Dr. Marin Olp notified.

## 2020-06-15 NOTE — Progress Notes (Signed)
Patient to receive one liter of IVF's and oral potassium today per order of Dr. Marin Olp.

## 2020-06-17 ENCOUNTER — Telehealth: Payer: Self-pay

## 2020-06-17 ENCOUNTER — Encounter (HOSPITAL_COMMUNITY): Payer: Self-pay

## 2020-06-17 ENCOUNTER — Other Ambulatory Visit: Payer: Self-pay | Admitting: *Deleted

## 2020-06-17 DIAGNOSIS — N390 Urinary tract infection, site not specified: Secondary | ICD-10-CM

## 2020-06-17 LAB — URINE CULTURE: Culture: >=100000 — AB

## 2020-06-17 MED ORDER — AMOXICILLIN-POT CLAVULANATE 875-125 MG PO TABS: 1.0000 | ORAL_TABLET | Freq: Two times a day (BID) | ORAL | 0 refills | Status: DC

## 2020-06-17 NOTE — Telephone Encounter (Signed)
-----   Message from Lenore Cordia sent at 06/17/2020 12:11 PM EDT ----- Regarding: FW: Biopsy Hello Dr Marin Olp,  Your patient is on the Blood thinner warfarin (coumadin) Patient will need to hold it 4 days prior to his biopsy.  His biopsy is June 20 th, making today his last dose.  Permission is needed.  Please let me know if I am calling patient or your office will call the patient.   [place]   ----- Message ----- From: Arne Cleveland, MD Sent: 06/17/2020   7:53 AM EDT To: Lenore Cordia Subject: RE: Biopsy                                     Ok  [place] core liver lesion F/u mets  DDH    ----- Message ----- From: Lenore Cordia Sent: 06/16/2020   4:08 PM EDT To: Ir Procedure Requests Subject: Biopsy                                         Procedure Requested:  CT Biopsy    Reason for Procedure: Progressive colon cancer. We need multiple biopsies for molecular studies.   Provider Requesting: Burney Gauze  Provider Telephone:  [phone]  Other Info:

## 2020-06-17 NOTE — Telephone Encounter (Signed)
Per Judson Roch- ok to hold Coumadin until after biospy. Contacted pt and made him aware of this. Pt stated understanding and declined any further questions or concerns.

## 2020-06-17 NOTE — Progress Notes (Unsigned)
       Patient Demographics  Patient Name  Zachary Willis, Zachary Willis Legal Sex  Male DOB  01/01/1946 SSN  [id number] Address  [address]  [place] [address] Phone  [phone] (Home)  [phone] (Mobile)     RE: Biopsy Received: Today Zachary Willis, [place], LPN  Zachary Willis Zachary Willis pt aware of this and he stated understanding. FYI!         Previous Messages    ----- Message -----  From: Lenore Cordia  Sent: 06/17/2020  12:19 PM EDT  To: [name], MD, Fabio Neighbors, LPN  Subject: Melton Alar: Biopsy                                     Hello Dr Marin Olp,   Your patient is on the Blood thinner warfarin (coumadin)  Patient will need to hold it 4 days prior to his biopsy.  His biopsy is June 20 th, making today his last dose.  Permission is needed.   Please let me know if I am calling patient or your office will call the patient.   Linh Johannes   ----- Message -----  From: Arne Cleveland, MD  Sent: 06/17/2020   7:53 AM EDT  To: Lenore Cordia  Subject: RE: Biopsy                                     Ok   [place] core liver lesion  F/u mets   DDH     ----- Message -----  From: Lenore Cordia  Sent: 06/16/2020   4:08 PM EDT  To: Ir Procedure Requests  Subject: Biopsy                                         Procedure Requested:  CT Biopsy    Reason for Procedure: Progressive colon cancer. We need multiple biopsies for molecular studies.    Provider Requesting: Burney Gauze  Provider Telephone:  [phone]   Other Info:

## 2020-06-18 ENCOUNTER — Other Ambulatory Visit: Payer: Self-pay | Admitting: Hematology & Oncology

## 2020-06-18 ENCOUNTER — Inpatient Hospital Stay: Payer: Medicare HMO

## 2020-06-18 ENCOUNTER — Other Ambulatory Visit: Payer: Self-pay | Admitting: Student

## 2020-06-18 DIAGNOSIS — I10 Essential (primary) hypertension: Secondary | ICD-10-CM

## 2020-06-22 ENCOUNTER — Ambulatory Visit (HOSPITAL_COMMUNITY)
Admission: RE | Admit: 2020-06-22 | Discharge: 2020-06-22 | Disposition: A | Discharge status: Home / Self Care | Payer: Medicare HMO | Source: Ambulatory Visit | Attending: Hematology & Oncology | Admitting: Hematology & Oncology

## 2020-06-22 ENCOUNTER — Other Ambulatory Visit: Payer: Self-pay

## 2020-06-22 ENCOUNTER — Encounter (HOSPITAL_COMMUNITY): Payer: Self-pay

## 2020-06-22 DIAGNOSIS — C189 Malignant neoplasm of colon, unspecified: Secondary | ICD-10-CM | POA: Insufficient documentation

## 2020-06-22 DIAGNOSIS — R160 Hepatomegaly, not elsewhere classified: Secondary | ICD-10-CM | POA: Diagnosis present

## 2020-06-22 DIAGNOSIS — C787 Secondary malignant neoplasm of liver and intrahepatic bile duct: Secondary | ICD-10-CM | POA: Insufficient documentation

## 2020-06-22 DIAGNOSIS — K769 Liver disease, unspecified: Secondary | ICD-10-CM | POA: Diagnosis not present

## 2020-06-22 DIAGNOSIS — Z85038 Personal history of other malignant neoplasm of large intestine: Secondary | ICD-10-CM | POA: Insufficient documentation

## 2020-06-22 LAB — CBC
HCT: 33.5 % — ABNORMAL LOW (ref 39.0–52.0)
Hemoglobin: 10.5 g/dL — ABNORMAL LOW (ref 13.0–17.0)
MCH: 31.3 pg (ref 26.0–34.0)
MCHC: 31.3 g/dL (ref 30.0–36.0)
MCV: 100 fL (ref 80.0–100.0)
Platelets: 53 10*3/uL — ABNORMAL LOW (ref 150–400)
RBC: 3.35 MIL/uL — ABNORMAL LOW (ref 4.22–5.81)
RDW: 20.8 % — ABNORMAL HIGH (ref 11.5–15.5)
WBC: 4.4 10*3/uL (ref 4.0–10.5)
nRBC: 1.8 % — ABNORMAL HIGH (ref 0.0–0.2)

## 2020-06-22 LAB — PROTIME-INR
INR: 1.2 (ref 0.8–1.2)
Prothrombin Time: 15.4 seconds — ABNORMAL HIGH (ref 11.4–15.2)

## 2020-06-22 LAB — GLUCOSE, CAPILLARY: Glucose-Capillary: 146 mg/dL — ABNORMAL HIGH (ref 70–99)

## 2020-06-22 MED ORDER — SODIUM CHLORIDE 0.9 % IV SOLN: INTRAVENOUS | Status: DC

## 2020-06-22 MED ORDER — MIDAZOLAM HCL 2 MG/2ML IJ SOLN
INTRAMUSCULAR | Status: AC
  Filled 2020-06-22: qty 2

## 2020-06-22 MED ORDER — HYDRALAZINE HCL 20 MG/ML IJ SOLN
INTRAMUSCULAR | Status: AC | PRN
  Administered 2020-06-22: 10 mg via INTRAVENOUS

## 2020-06-22 MED ORDER — FENTANYL CITRATE (PF) 100 MCG/2ML IJ SOLN
INTRAMUSCULAR | Status: AC | PRN
  Administered 2020-06-22: 50 ug via INTRAVENOUS

## 2020-06-22 MED ORDER — SULFUR HEXAFLUORIDE MICROSPH 60.7-25 MG IJ SUSR
INTRAMUSCULAR | Status: AC
  Filled 2020-06-22: qty 5

## 2020-06-22 MED ORDER — FENTANYL CITRATE (PF) 100 MCG/2ML IJ SOLN
INTRAMUSCULAR | Status: AC
  Filled 2020-06-22: qty 2

## 2020-06-22 MED ORDER — HEPARIN SOD (PORK) LOCK FLUSH 100 UNIT/ML IV SOLN
500.0000 [IU] | Freq: Once | INTRAVENOUS | Status: AC
  Administered 2020-06-22: 500 [IU] via INTRAVENOUS
  Filled 2020-06-22: qty 5

## 2020-06-22 MED ORDER — GELATIN ABSORBABLE 12-7 MM EX MISC
CUTANEOUS | Status: AC
  Filled 2020-06-22: qty 1

## 2020-06-22 MED ORDER — LIDOCAINE HCL 1 % IJ SOLN
INTRAMUSCULAR | Status: AC | PRN
  Administered 2020-06-22: 10 mL via INTRADERMAL

## 2020-06-22 MED ORDER — MIDAZOLAM HCL 2 MG/2ML IJ SOLN
INTRAMUSCULAR | Status: AC | PRN
  Administered 2020-06-22: 1 mg via INTRAVENOUS

## 2020-06-22 MED ORDER — SULFUR HEXAFLUORIDE MICROSPH 60.7-25 MG IJ SUSR
5.0000 mL | Freq: Once | INTRAMUSCULAR | Status: AC | PRN
  Administered 2020-06-22: 2.4 mL via INTRAVENOUS

## 2020-06-22 MED ORDER — LIDOCAINE HCL 1 % IJ SOLN
INTRAMUSCULAR | Status: AC
  Filled 2020-06-22: qty 20

## 2020-06-22 MED ORDER — SULFUR HEXAFLUORIDE MICROSPH 60.7-25 MG IJ SUSR: 5.0000 mL | Freq: Once | INTRAMUSCULAR | Status: DC | PRN

## 2020-06-22 NOTE — Progress Notes (Signed)
Pt states, per MD, he checks blood sugar daily. CBG done.

## 2020-06-22 NOTE — Procedures (Signed)
Interventional Radiology Procedure Note  Procedure: Contrast enhanced ultrasound guided liver mass biopsy  Findings: Please refer to procedural dictation for full description. Right lateral subcapsular mass, non-enhancing on CEUS.  18 ga core biopsy x3.  Gelfoam slurry needle track embolization.  Complications: None immediate  Estimated Blood Loss: < 5 mL  Recommendations: Strict 3 hour bedrest. Follow Pathology results.   Ruthann Cancer, MD

## 2020-06-22 NOTE — H&P (Signed)
Chief Complaint: Worsening liver lesions. Request is for liver biopsy  Referring Physician(s): Willis,Zachary R  Supervising Physician: Zachary Willis  Patient Status: Zachary Willis - Out-pt  History of Present Illness: Zachary Willis is a 75 y.o. male 75 y.o. make outpatient. History of metastatic colorectal Willis. With rising CEA despite ongoing chemotherapy. Found to have enlarging liver lesions. CT CAP from 6.90.22 reads Interval enlargement of hypodense liver lesions, consistent with worsened hepatic metastatic disease. Team is requesting a liver  biopsy for further evaluation for molecular studies.    Currently without any significant complaints. Patient alert and laying in bed, calm and comfortable. Denies any fevers, headache, chest pain, SOB, cough, abdominal pain, nausea, vomiting or bleeding. Return precautions and treatment recommendations and follow-up discussed with the patient who is agreeable with the plan.   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 [facility])    current oncologist-- dr Zachary Willis (previous oncologist at [facility] oncology)--- dx 12/ 2017,  Stage IIb ([id number]),  s/p  resection colon tumor 01-01-2016 ,  recurrent w/ liver mets via bx 08/ 2018  , had chemo then partial hepatectomy and completed chemo after surgery/ liver mets recurrence 08/ 2019   Cushing's disease ([place])    endocriniologist-- dr Zachary Willis)   Droopy eyelid, right    11-15-2017   GERD (gastroesophageal reflux disease)    Goals of care, counseling/discussion 08/30/2017   Gout    11-15-2017 per pt last  flare-up 2018   History of benign pituitary tumor    resection 07-25-2013   History of Willis chemotherapy    COMPLETED 02-20-2017  FOR  COLON Willis WITH LIVER METS   History of radiation therapy    03/ 2016  remainder of pituitary tumor   Hypertension    Hypertensive cardiovascular disease    Hypothyroidism, secondary    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   OA (osteoarthritis)    Renal insufficiency    Secondary adrenal insufficiency ([place])    Secondary male hypogonadism    Wears glasses    Wears partial dentures    upper and lower    Past Surgical History:  Procedure Laterality Date   COLOSTOMY TAKEDOWN  05-16-2016   @[facility]    IR 3D INDEPENDENT WKST  07/17/2018   IR ANGIOGRAM SELECTIVE EACH ADDITIONAL VESSEL  07/17/2018   IR ANGIOGRAM SELECTIVE EACH ADDITIONAL VESSEL  07/17/2018   IR ANGIOGRAM SELECTIVE EACH ADDITIONAL VESSEL  07/17/2018   IR ANGIOGRAM SELECTIVE EACH ADDITIONAL VESSEL  07/17/2018   IR ANGIOGRAM SELECTIVE EACH ADDITIONAL VESSEL  07/17/2018   IR ANGIOGRAM SELECTIVE EACH ADDITIONAL VESSEL  07/30/2018   IR ANGIOGRAM SELECTIVE EACH ADDITIONAL VESSEL  07/30/2018   IR ANGIOGRAM SELECTIVE EACH ADDITIONAL VESSEL  07/30/2018   IR ANGIOGRAM SELECTIVE EACH ADDITIONAL VESSEL  07/30/2018   IR ANGIOGRAM SELECTIVE EACH ADDITIONAL VESSEL  07/30/2018   IR ANGIOGRAM SELECTIVE EACH ADDITIONAL VESSEL  08/27/2018   IR ANGIOGRAM SELECTIVE EACH ADDITIONAL VESSEL  08/27/2018   IR ANGIOGRAM VISCERAL SELECTIVE  07/17/2018   IR ANGIOGRAM VISCERAL SELECTIVE  07/17/2018   IR ANGIOGRAM  VISCERAL SELECTIVE  08/27/2018   IR EMBO ARTERIAL NOT HEMORR HEMANG INC GUIDE ROADMAPPING  07/17/2018   IR EMBO TUMOR ORGAN ISCHEMIA INFARCT INC GUIDE ROADMAPPING  07/30/2018   IR EMBO TUMOR ORGAN ISCHEMIA INFARCT INC GUIDE ROADMAPPING  08/27/2018   IR RADIOLOGIST EVAL & MGMT  10/10/2017   IR RADIOLOGIST EVAL & MGMT  11/07/2017   IR RADIOLOGIST EVAL & MGMT  12/21/2017   IR RADIOLOGIST EVAL & MGMT  06/19/2018   IR  RADIOLOGIST EVAL & MGMT  09/19/2018   IR RADIOLOGIST EVAL & MGMT  12/18/2018   IR US GUIDE VASC ACCESS RIGHT  07/17/2018   IR US GUIDE VASC ACCESS RIGHT  07/30/2018   IR US GUIDE VASC ACCESS RIGHT  08/27/2018   OPEN PARTIAL HEPATECTOMY   01-13-2017    @[facility]    AND CHOLECYSTECTOMY   RADIOFREQUENCY ABLATION N/A 11/22/2017   Procedure: CT MICROWAVE ABLATION LIVER;  Surgeon: Zachary Mariscal, MD;  Location: [facility];  Service: Anesthesiology;  Laterality: N/A;   REVISION TOTAL KNEE ARTHROPLASTY Left 12/20/2013   SUBTOTAL COLECTOMY  01-01-2016  @[facility]    W/  CREATION ILEOSTOMY AND UMBILICAL HERNIA REPAIR   TOTAL KNEE ARTHROPLASTY Bilateral left 11-16-2010;  right 10-26-2011   TRANSPHENOIDAL PITUITARY RESECTION  07-25-2013   @[facility]    Allergies: Patient has no known allergies.  Medications: Prior to Admission medications   Medication Sig Start Date End Date Taking? Authorizing Provider  amoxicillin-clavulanate (AUGMENTIN) 875-125 MG tablet Take 1 tablet by mouth 2 (two) times daily. 06/17/20   [name], MD  atorvastatin (LIPITOR) 40 MG tablet Take 40 mg by mouth daily. 03/22/18   [provider]  carvedilol (COREG) 6.25 MG tablet Take 6.25 mg by mouth 2 (two) times daily with a meal.  05/31/18   [provider]  COVID-19 mRNA Vac-TriS, Pfizer, SUSP injection Inject into the muscle. 06/11/20   Carlyle Basques, MD  ferrous sulfate 325 (65 FE) MG tablet Take 325 mg by mouth daily.    [provider]  furosemide (LASIX) 40 MG tablet Take 20 mg by mouth every other day. 02/01/20   [provider]  hydrocortisone (CORTEF) 10 MG tablet Take 1-2 tablets (10-20 mg total) by mouth See admin instructions. 20mg  in AM 10mg  in PM 09/20/19   Patrecia Pour, MD  ibuprofen (ADVIL) 200 MG tablet Take 400 mg by mouth every 6 (six) hours as needed for headache or mild pain.    [provider]  levothyroxine (SYNTHROID, LEVOTHROID) 88 MCG tablet Take 88 mcg by mouth daily before  breakfast.  05/15/17   [provider]  lidocaine-prilocaine (EMLA) cream Apply to affected area once 02/19/20   Willis, Rudell Cobb, MD  metolazone (ZAROXOLYN) 5 MG tablet Take 1 tablet (5 mg total) by mouth daily. Take 5 mg of Zaroxolyn 1 hour before Lasix. 06/03/20   [name], MD  NASAL [facility] NA Place 1 spray into the nose daily.     [provider]  ondansetron (ZOFRAN) 8 MG tablet Take 1 tablet (8 mg total) by mouth every 4 (four) hours as needed for nausea. Patient not taking: No sig reported 12/26/19   Veryl Speak, MD  pantoprazole (PROTONIX) 40 MG tablet Take 40 mg by mouth every morning.  05/16/14   [provider]  polyvinyl alcohol (LIQUIFILM TEARS) 1.4 % ophthalmic solution Place 1 drop into both eyes daily as needed for dry eyes.     [provider]  Potassium Chloride ER 20  MEQ TBCR Take 20 mEq by mouth daily. 11/17/14   [provider]  Semaglutide,0.25 or 0.5MG /DOS, (OZEMPIC, 0.25 OR 0.5 MG/DOSE,) 2 MG/1.5ML SOPN Inject into the skin. 03/26/20   [provider]  telmisartan (MICARDIS) 80 MG tablet TAKE 1 TABLET BY MOUTH EVERY DAY 06/18/20   [name], MD  testosterone (ANDRODERM) 4 MG/24HR PT24 patch Place 1 patch onto the skin daily.  04/17/15   [provider]  trifluridine-tipiracil (LONSURF) 15-6.14 MG tablet Take 5 tablets (75 mg of trifluridine total) by mouth 2 (two) times daily after a meal. 1 hr after AM & PM meals on days 1-5, 8-12. Repeat every 28 days. Take 1 hr after morning and evening meals. 03/12/20   [name], MD  warfarin (COUMADIN) 4 MG tablet Take 1 tablet (4 mg total) by mouth daily. Patient taking differently: Take 2 mg by mouth daily. 03/23/20   [name], MD  prochlorperazine (COMPAZINE) 10 MG tablet Take 1 tablet (10 mg total) by mouth every 6 (six) hours as needed (NAUSEA). Patient not taking: Reported on 10/29/2019 12/18/18 10/29/19  [name], MD     Family History   Problem Relation Age of Onset   Hypertension Mother    Willis Mother    Willis Father       Review of Systems: A 12 point ROS discussed and pertinent positives are indicated in the HPI above.  All other systems are negative.  Review of Systems  Constitutional:  Negative for fever.  HENT:  Negative for congestion.   Respiratory:  Negative for cough and shortness of breath.   Cardiovascular:  Negative for chest pain.  Gastrointestinal:  Negative for abdominal pain.  Neurological:  Negative for headaches.  Psychiatric/Behavioral:  Negative for behavioral problems and confusion.    Vital Signs: BP (!) 186/97   Pulse (!) 56   Temp 98 F (36.7 C) (Oral)   Resp 12   SpO2 100%   Physical Exam Vitals and nursing note reviewed.  Constitutional:      Appearance: He is well-developed.  HENT:     Head: Normocephalic.  Cardiovascular:     Rate and Rhythm: Normal rate and regular rhythm.     Heart sounds: Normal heart sounds.  Pulmonary:     Effort: Pulmonary effort is normal.     Breath sounds: Normal breath sounds.  Musculoskeletal:        General: Normal range of motion.     Cervical back: Normal range of motion.  Skin:    General: Skin is dry.     Findings: Bruising (all 4 extremities) present.     Comments: Swelling noted to RLE.  Neurological:     Mental Status: He is alert and oriented to person, place, and time.    Imaging: CT CHEST ABDOMEN PELVIS W CONTRAST  Result Date: 06/11/2020 CLINICAL DATA:  Metastatic colorectal Willis, assess treatment response, rising CEA, ongoing chemotherapy EXAM: CT CHEST, ABDOMEN, AND PELVIS WITH CONTRAST TECHNIQUE: Multidetector CT imaging of the chest, abdomen and pelvis was performed following the standard protocol during bolus administration of intravenous contrast. CONTRAST:  62mL OMNIPAQUE IOHEXOL 300 MG/ML SOLN, additional oral enteric contrast COMPARISON:  CT abdomen pelvis, 12/26/2019, CT chest angiogram, 12/26/2019 FINDINGS: CT  CHEST FINDINGS Cardiovascular: Right chest port catheter. Aortic atherosclerosis. Mild cardiomegaly. Extensive 3 vessel coronary artery calcifications. No pericardial effusion. Mediastinum/Nodes: No enlarged mediastinal, hilar, or axillary lymph nodes. Unchanged nodule of the left lobe of the thyroid measuring 4.1  cm (series 2, image 10). In the setting of significant comorbidities or limited life expectancy, no follow-up recommended (ref: J Am Coll Radiol. 2015 Feb;12(2): 143-50). Trachea, and esophagus demonstrate no significant findings. Lungs/Pleura: Diffuse bilateral bronchial wall thickening. Numerous bilateral pulmonary nodules, not significantly changed compared to prior examination, an index nodule of the paramedian right upper lobe measuring 1.2 x 0.8 cm (series 4, image 52). An additional index nodule of the superior segment left lower lobe measures 0.6 cm (series 4, image 60). Additional index nodule of the medial segment right middle lobe measures 1.1 x 0.9 cm (series 4, image 97). Trace bilateral pleural effusions. Musculoskeletal: No chest wall mass. Unchanged sclerotic lesion of the lateral right eighth rib (series 2, image 51). CT ABDOMEN PELVIS FINDINGS Hepatobiliary: Interval enlargement of a hypodense lesion of the central liver, measuring 4.5 x 3.0 cm, previously 3.8 x 2.9 cm (series 2, image 53). An additional lesion of the inferior right lobe of the liver, hepatic segment VI, measures 4.6 x 4.2 cm, previously 4.2 x 3.3 cm (series 2, image 57). Status post cholecystectomy. No biliary ductal dilatation Pancreas: Unchanged fluid attenuation lesion of the proximal pancreatic body measuring 1.5 cm (series 2, image 60). No pancreatic ductal dilatation or surrounding inflammatory changes. Spleen: Unchanged splenomegaly, maximum coronal span 15.8 cm (series 5, image 65). Adrenals/Urinary Tract: Adrenal glands are unremarkable. Kidneys are normal, without renal calculi, solid lesion, or hydronephrosis.  Bladder is unremarkable. Stomach/Bowel: Stomach is within normal limits. Status post descending colon resection and reanastomosis. No evidence of bowel wall thickening, distention, or inflammatory changes. Vascular/Lymphatic: Aortic atherosclerosis. No enlarged abdominal or pelvic lymph nodes. Reproductive: No mass or other abnormality. Other: Small midline ventral abdominal wall hernia containing a single nonobstructed loop of mid small bowel (series 2, image 63). Small volume ascites throughout the abdomen and pelvis, new compared to prior examination. Musculoskeletal: No acute or significant osseous findings. IMPRESSION: 1. Numerous bilateral pulmonary nodules, not significantly changed compared to prior examination, consistent with pulmonary metastatic disease. 2. Interval enlargement of hypodense liver lesions, consistent with worsened hepatic metastatic disease. 3. Unchanged sclerotic lesion of the lateral right eighth rib, which remains somewhat suspicious for osseous metastasis. No additional evidence of osseous metastatic disease. Attention on follow-up. 4. New small volume ascites throughout the abdomen and pelvis. This is generally somewhat suspicious for peritoneal metastatic disease although there is no direct evidence of peritoneal or omental nodularity. 5. Diffuse bilateral bronchial wall thickening, consistent with nonspecific infectious or inflammatory bronchitis. 6. Unchanged fluid attenuation lesion of the proximal pancreatic body measuring 1.5 cm. This is almost certainly a small IPMN. No further follow-up or characterization is required specifically for this lesion given no incremental observed risk of malignancy for such lesions smaller than 2 cm and coexistence of other metastatic malignancy. 7. Coronary artery disease. Aortic Atherosclerosis (ICD10-I70.0). Electronically Signed   By: Eddie Candle M.D.   On: 06/11/2020 22:04    Labs:  CBC: Recent Labs    05/25/20 0849 06/03/20 0814  06/11/20 1008 06/15/20 0923  WBC 6.2 3.9* 1.5* 3.3*  HGB 11.6* 10.8* 11.1* 11.6*  HCT 36.2* 32.6* 34.9* 35.4*  PLT 68* 67* 96* 101*    COAGS: Recent Labs    09/11/19 1052 09/12/19 0300 05/25/20 0848 06/03/20 0815 06/11/20 1008 06/15/20 0923  INR 8.7*   < > 1.9* 2.5* 2.3* 2.4*  APTT 57*  --   --   --   --   --    < > = values  in this interval not displayed.    BMP: Recent Labs    09/18/19 0529 09/19/19 0315 09/20/19 0440 10/07/19 1020 10/22/19 0927 05/25/20 0849 06/03/20 0814 06/11/20 1008 06/15/20 0923  NA 136 137 137 143   < > 141 144 143 135  K 2.9* 3.0* 3.1* 2.9*   < > 4.2 3.9 3.6 2.8*  CL 102 100 100 101   < > 102 107 107 93*  CO2 29 30 31  38*   < > 35* 31 29 35*  GLUCOSE 252* 205* 229* 210*   < > 147* 105* 117* 132*  BUN 30* 25* 22 15   < > 20 23 21 23   CALCIUM 7.6* 7.5* 7.7* 8.9   < > 9.2 8.9 8.6* 9.0  CREATININE 1.11 0.88 0.85 0.89   < > 0.98 0.99 1.28* 1.20  GFRNONAA >60 >60 >60 >60   < > >60 >60 59* >60  GFRAA >60 >60 >60 >60  --   --   --   --   --    < > = values in this interval not displayed.    LIVER FUNCTION TESTS: Recent Labs    05/25/20 0849 06/03/20 0814 06/11/20 1008 06/15/20 0923  BILITOT 2.8* 2.3* 3.1* 2.6*  AST 46* 36 38 34  ALT 47* 40 43 34  ALKPHOS 221* 218* 209* 207*  PROT 5.5* 5.3* 5.4* 5.7*  ALBUMIN 3.2* 3.2* 3.2* 3.2*      Assessment and Plan:  75 y.o. make outpatient. History of metastatic colorectal Willis. With rising CEA despite ongoing chemotherapy. Found to have enlarging liver lesions. CT CAP from 6.90.22 reads Interval enlargement of hypodense liver lesions, consistent with worsened hepatic metastatic disease. Team is requesting a liver  biopsy for further evaluation for molecular studies.    Patient is on coumadin. Last dose taken on 6.15.22. All other labs and medications are within acceptable parameters. Patient has been NPO since 6 am.  Risks and benefits of liver biopsy was discussed with the patient  and/or patient's family including, but not limited to bleeding, infection, damage to adjacent structures or low yield requiring additional tests.  All of the questions were answered and there is agreement to proceed.  Consent signed and in chart.   Thank you for this interesting consult.  I greatly enjoyed meeting Zachary Willis and look forward to participating in their care.  A copy of this report was sent to the requesting provider on this date.  Electronically Signed: Jacqualine Mau, NP 06/22/2020, 11:31 AM   I spent a total of  30 Minutes   in face to face in clinical consultation, greater than 50% of which was counseling/coordinating care for liver biopsy

## 2020-06-22 NOTE — Discharge Instructions (Signed)
Please call Interventional Radiology clinic [phone] with any questions or concerns.  You may remove your dressing and shower tomorrow.  DO NOT use EMLA cream for 2 weeks after port placement as this cream will remove surgical glue on your incision.

## 2020-06-23 ENCOUNTER — Telehealth: Payer: Self-pay | Admitting: *Deleted

## 2020-06-23 ENCOUNTER — Other Ambulatory Visit: Payer: Self-pay | Admitting: Hematology & Oncology

## 2020-06-23 DIAGNOSIS — C787 Secondary malignant neoplasm of liver and intrahepatic bile duct: Secondary | ICD-10-CM

## 2020-06-23 DIAGNOSIS — E860 Dehydration: Secondary | ICD-10-CM

## 2020-06-23 DIAGNOSIS — D649 Anemia, unspecified: Secondary | ICD-10-CM

## 2020-06-23 DIAGNOSIS — E876 Hypokalemia: Secondary | ICD-10-CM

## 2020-06-23 DIAGNOSIS — C189 Malignant neoplasm of colon, unspecified: Secondary | ICD-10-CM

## 2020-06-23 NOTE — Telephone Encounter (Signed)
Message received from patient's wife, Meredith Mody wanting to know if pt needs to keep scheduled appts with Dr. Marin Olp this Friday, 06/26/20 and to know what dose of Coumadin pt should start back on. Dr. Marin Olp notified.  Call placed back to [place] notified per order of Dr. Marin Olp to hold on appt with Dr. Marin Olp this Friday until biopsy results are back, to come in this Friday for lab appt only and to restart Coumadin back today at 4 mg daily.  Teach back done. Meredith Mody is appreciative of call back and has no further questions at this time.

## 2020-06-24 LAB — SURGICAL PATHOLOGY

## 2020-06-25 ENCOUNTER — Telehealth: Payer: Self-pay | Admitting: *Deleted

## 2020-06-25 ENCOUNTER — Inpatient Hospital Stay: Payer: Medicare HMO

## 2020-06-25 ENCOUNTER — Other Ambulatory Visit: Payer: Self-pay

## 2020-06-25 DIAGNOSIS — D649 Anemia, unspecified: Secondary | ICD-10-CM

## 2020-06-25 DIAGNOSIS — Z5112 Encounter for antineoplastic immunotherapy: Secondary | ICD-10-CM | POA: Diagnosis not present

## 2020-06-25 DIAGNOSIS — E860 Dehydration: Secondary | ICD-10-CM

## 2020-06-25 DIAGNOSIS — E876 Hypokalemia: Secondary | ICD-10-CM

## 2020-06-25 DIAGNOSIS — C189 Malignant neoplasm of colon, unspecified: Secondary | ICD-10-CM

## 2020-06-25 LAB — CBC WITH DIFFERENTIAL (CANCER CENTER ONLY)
Abs Immature Granulocytes: 0.28 10*3/uL — ABNORMAL HIGH (ref 0.00–0.07)
Basophils Absolute: 0.1 10*3/uL (ref 0.0–0.1)
Basophils Relative: 1 %
Eosinophils Absolute: 0.1 10*3/uL (ref 0.0–0.5)
Eosinophils Relative: 1 %
HCT: 36.7 % — ABNORMAL LOW (ref 39.0–52.0)
Hemoglobin: 11.3 g/dL — ABNORMAL LOW (ref 13.0–17.0)
Immature Granulocytes: 4 %
Lymphocytes Relative: 7 %
Lymphs Abs: 0.5 10*3/uL — ABNORMAL LOW (ref 0.7–4.0)
MCH: 31 pg (ref 26.0–34.0)
MCHC: 30.8 g/dL (ref 30.0–36.0)
MCV: 100.8 fL — ABNORMAL HIGH (ref 80.0–100.0)
Monocytes Absolute: 0.4 10*3/uL (ref 0.1–1.0)
Monocytes Relative: 6 %
Neutro Abs: 5.4 10*3/uL (ref 1.7–7.7)
Neutrophils Relative %: 81 %
Platelet Count: 58 10*3/uL — ABNORMAL LOW (ref 150–400)
RBC: 3.64 MIL/uL — ABNORMAL LOW (ref 4.22–5.81)
RDW: 21 % — ABNORMAL HIGH (ref 11.5–15.5)
WBC Count: 6.7 10*3/uL (ref 4.0–10.5)
nRBC: 1.5 % — ABNORMAL HIGH (ref 0.0–0.2)

## 2020-06-25 LAB — CMP (CANCER CENTER ONLY)
ALT: 76 U/L — ABNORMAL HIGH (ref 0–44)
AST: 61 U/L — ABNORMAL HIGH (ref 15–41)
Albumin: 3.2 g/dL — ABNORMAL LOW (ref 3.5–5.0)
Alkaline Phosphatase: 269 U/L — ABNORMAL HIGH (ref 38–126)
Anion gap: 6 (ref 5–15)
BUN: 20 mg/dL (ref 8–23)
CO2: 32 mmol/L (ref 22–32)
Calcium: 9.6 mg/dL (ref 8.9–10.3)
Chloride: 104 mmol/L (ref 98–111)
Creatinine: 1.09 mg/dL (ref 0.61–1.24)
GFR, Estimated: >60 mL/min (ref >60)
Glucose, Bld: 141 mg/dL — ABNORMAL HIGH (ref 70–99)
Potassium: 4.4 mmol/L (ref 3.5–5.1)
Sodium: 142 mmol/L (ref 135–145)
Total Bilirubin: 1.9 mg/dL — ABNORMAL HIGH (ref 0.3–1.2)
Total Protein: 5.6 g/dL — ABNORMAL LOW (ref 6.5–8.1)

## 2020-06-25 LAB — PROTIME-INR
INR: 1.2 (ref 0.8–1.2)
Prothrombin Time: 15.2 seconds (ref 11.4–15.2)

## 2020-06-25 NOTE — Telephone Encounter (Signed)
Call placed to patient's wife to notify her per order of Dr. Marin Olp to continue Coumadin 4 mg PO daily.  Teach back done.  Pt.'s wife has no questions or concerns at this time.

## 2020-06-26 ENCOUNTER — Inpatient Hospital Stay: Payer: Medicare HMO

## 2020-06-26 ENCOUNTER — Inpatient Hospital Stay: Payer: Medicare HMO | Admitting: Hematology & Oncology

## 2020-07-01 ENCOUNTER — Inpatient Hospital Stay: Payer: Medicare HMO

## 2020-07-01 ENCOUNTER — Other Ambulatory Visit: Payer: Self-pay

## 2020-07-01 ENCOUNTER — Telehealth: Payer: Self-pay | Admitting: *Deleted

## 2020-07-01 ENCOUNTER — Other Ambulatory Visit: Payer: Self-pay | Admitting: *Deleted

## 2020-07-01 DIAGNOSIS — R791 Abnormal coagulation profile: Secondary | ICD-10-CM

## 2020-07-01 DIAGNOSIS — Z95828 Presence of other vascular implants and grafts: Secondary | ICD-10-CM

## 2020-07-01 DIAGNOSIS — C787 Secondary malignant neoplasm of liver and intrahepatic bile duct: Secondary | ICD-10-CM

## 2020-07-01 DIAGNOSIS — I482 Chronic atrial fibrillation, unspecified: Secondary | ICD-10-CM

## 2020-07-01 DIAGNOSIS — Z5112 Encounter for antineoplastic immunotherapy: Secondary | ICD-10-CM | POA: Diagnosis not present

## 2020-07-01 DIAGNOSIS — C189 Malignant neoplasm of colon, unspecified: Secondary | ICD-10-CM

## 2020-07-01 LAB — CMP (CANCER CENTER ONLY)
ALT: 62 U/L — ABNORMAL HIGH (ref 0–44)
AST: 69 U/L — ABNORMAL HIGH (ref 15–41)
Albumin: 3.2 g/dL — ABNORMAL LOW (ref 3.5–5.0)
Alkaline Phosphatase: 274 U/L — ABNORMAL HIGH (ref 38–126)
Anion gap: 5 (ref 5–15)
BUN: 23 mg/dL (ref 8–23)
CO2: 30 mmol/L (ref 22–32)
Calcium: 8.8 mg/dL — ABNORMAL LOW (ref 8.9–10.3)
Chloride: 108 mmol/L (ref 98–111)
Creatinine: 0.98 mg/dL (ref 0.61–1.24)
GFR, Estimated: >60 mL/min (ref >60)
Glucose, Bld: 106 mg/dL — ABNORMAL HIGH (ref 70–99)
Potassium: 3.6 mmol/L (ref 3.5–5.1)
Sodium: 143 mmol/L (ref 135–145)
Total Bilirubin: 2 mg/dL — ABNORMAL HIGH (ref 0.3–1.2)
Total Protein: 5.4 g/dL — ABNORMAL LOW (ref 6.5–8.1)

## 2020-07-01 LAB — CBC WITH DIFFERENTIAL (CANCER CENTER ONLY)
Abs Immature Granulocytes: 0.04 10*3/uL (ref 0.00–0.07)
Basophils Absolute: 0 10*3/uL (ref 0.0–0.1)
Basophils Relative: 1 %
Eosinophils Absolute: 0.1 10*3/uL (ref 0.0–0.5)
Eosinophils Relative: 2 %
HCT: 35.2 % — ABNORMAL LOW (ref 39.0–52.0)
Hemoglobin: 11.1 g/dL — ABNORMAL LOW (ref 13.0–17.0)
Immature Granulocytes: 1 %
Lymphocytes Relative: 7 %
Lymphs Abs: 0.3 10*3/uL — ABNORMAL LOW (ref 0.7–4.0)
MCH: 31.3 pg (ref 26.0–34.0)
MCHC: 31.5 g/dL (ref 30.0–36.0)
MCV: 99.2 fL (ref 80.0–100.0)
Monocytes Absolute: 0.3 10*3/uL (ref 0.1–1.0)
Monocytes Relative: 9 %
Neutro Abs: 3.1 10*3/uL (ref 1.7–7.7)
Neutrophils Relative %: 80 %
Platelet Count: 64 10*3/uL — ABNORMAL LOW (ref 150–400)
RBC: 3.55 MIL/uL — ABNORMAL LOW (ref 4.22–5.81)
RDW: 20 % — ABNORMAL HIGH (ref 11.5–15.5)
WBC Count: 3.9 10*3/uL — ABNORMAL LOW (ref 4.0–10.5)
nRBC: 0.5 % — ABNORMAL HIGH (ref 0.0–0.2)

## 2020-07-01 LAB — PROTIME-INR
INR: 1.6 — ABNORMAL HIGH (ref 0.8–1.2)
Prothrombin Time: 19 seconds — ABNORMAL HIGH (ref 11.4–15.2)

## 2020-07-01 NOTE — Telephone Encounter (Signed)
As noted below by Dr. Marin Olp, I informed the wife that Zachary Willis needs to continue taking Coumadin 4 mg tablets daily. Please come in on Friday, July, 1st, for another INR check. She verbalized understanding.

## 2020-07-01 NOTE — Telephone Encounter (Signed)
-----   Message from [name], MD sent at 07/01/2020  3:32 PM EDT ----- Please call and tell him to stay on the 4 mg daily dose of Coumadin.  Have him come in on Friday for another INR check.  [name]

## 2020-07-02 ENCOUNTER — Other Ambulatory Visit: Payer: Self-pay | Admitting: *Deleted

## 2020-07-02 DIAGNOSIS — I482 Chronic atrial fibrillation, unspecified: Secondary | ICD-10-CM

## 2020-07-03 ENCOUNTER — Inpatient Hospital Stay: Payer: Medicare HMO | Attending: Hematology & Oncology

## 2020-07-03 ENCOUNTER — Other Ambulatory Visit: Payer: Self-pay

## 2020-07-03 ENCOUNTER — Telehealth: Payer: Self-pay | Admitting: *Deleted

## 2020-07-03 ENCOUNTER — Inpatient Hospital Stay: Payer: Medicare HMO

## 2020-07-03 DIAGNOSIS — C189 Malignant neoplasm of colon, unspecified: Secondary | ICD-10-CM

## 2020-07-03 DIAGNOSIS — C8510 Unspecified B-cell lymphoma, unspecified site: Secondary | ICD-10-CM | POA: Diagnosis not present

## 2020-07-03 DIAGNOSIS — Z5111 Encounter for antineoplastic chemotherapy: Secondary | ICD-10-CM | POA: Diagnosis present

## 2020-07-03 DIAGNOSIS — C799 Secondary malignant neoplasm of unspecified site: Secondary | ICD-10-CM | POA: Insufficient documentation

## 2020-07-03 DIAGNOSIS — C787 Secondary malignant neoplasm of liver and intrahepatic bile duct: Secondary | ICD-10-CM

## 2020-07-03 DIAGNOSIS — I482 Chronic atrial fibrillation, unspecified: Secondary | ICD-10-CM

## 2020-07-03 DIAGNOSIS — Z79899 Other long term (current) drug therapy: Secondary | ICD-10-CM | POA: Diagnosis not present

## 2020-07-03 DIAGNOSIS — Z5112 Encounter for antineoplastic immunotherapy: Secondary | ICD-10-CM | POA: Diagnosis not present

## 2020-07-03 LAB — CBC WITH DIFFERENTIAL (CANCER CENTER ONLY)
Abs Immature Granulocytes: 0.04 10*3/uL (ref 0.00–0.07)
Basophils Absolute: 0 10*3/uL (ref 0.0–0.1)
Basophils Relative: 0 %
Eosinophils Absolute: 0.1 10*3/uL (ref 0.0–0.5)
Eosinophils Relative: 2 %
HCT: 37.6 % — ABNORMAL LOW (ref 39.0–52.0)
Hemoglobin: 11.6 g/dL — ABNORMAL LOW (ref 13.0–17.0)
Immature Granulocytes: 1 %
Lymphocytes Relative: 5 %
Lymphs Abs: 0.4 10*3/uL — ABNORMAL LOW (ref 0.7–4.0)
MCH: 31.3 pg (ref 26.0–34.0)
MCHC: 30.9 g/dL (ref 30.0–36.0)
MCV: 101.3 fL — ABNORMAL HIGH (ref 80.0–100.0)
Monocytes Absolute: 0.4 10*3/uL (ref 0.1–1.0)
Monocytes Relative: 6 %
Neutro Abs: 6.8 10*3/uL (ref 1.7–7.7)
Neutrophils Relative %: 86 %
Platelet Count: 68 10*3/uL — ABNORMAL LOW (ref 150–400)
RBC: 3.71 MIL/uL — ABNORMAL LOW (ref 4.22–5.81)
RDW: 20.1 % — ABNORMAL HIGH (ref 11.5–15.5)
WBC Count: 7.8 10*3/uL (ref 4.0–10.5)
nRBC: 0 % (ref 0.0–0.2)

## 2020-07-03 LAB — PROTIME-INR
INR: 2.4 — ABNORMAL HIGH (ref 0.8–1.2)
Prothrombin Time: 26.2 seconds — ABNORMAL HIGH (ref 11.4–15.2)

## 2020-07-03 NOTE — Telephone Encounter (Signed)
-----   Message from [name], MD sent at 07/03/2020  2:03 PM EDT ----- Call - the INR is 2.4.  please alternate 4 mg daily with 2 mg daily.  Zachary Willis

## 2020-07-03 NOTE — Telephone Encounter (Signed)
As noted below by Dr. Marin Olp, I informed the wife that the INR is 2.4. Please alternate Coumadin 4 mg daily with 2 mg daily. She verbalized understanding.

## 2020-07-09 ENCOUNTER — Other Ambulatory Visit (HOSPITAL_COMMUNITY): Payer: Self-pay

## 2020-07-09 ENCOUNTER — Telehealth: Payer: Self-pay | Admitting: *Deleted

## 2020-07-09 NOTE — Telephone Encounter (Signed)
Returned wife's phone call regarding cellulitis. She stated,"last week I took Zachary Willis to Urgent Care and they gave him Keflex. We went back this morning for a follow up appointment. His leg was looking great this past week and this morning it was back to looking like it was last week. Purple and swollen. The doctor refilled the Keflex and added Bactrim as another antibiotic. [facility] has referred him to Infectious Disease." I told her that I would give this message to Dr. Marin Olp.

## 2020-07-10 ENCOUNTER — Other Ambulatory Visit: Payer: Self-pay | Admitting: *Deleted

## 2020-07-10 ENCOUNTER — Encounter: Payer: Self-pay | Admitting: *Deleted

## 2020-07-10 ENCOUNTER — Other Ambulatory Visit: Payer: Self-pay

## 2020-07-10 ENCOUNTER — Telehealth: Payer: Self-pay | Admitting: *Deleted

## 2020-07-10 ENCOUNTER — Inpatient Hospital Stay: Payer: Medicare HMO

## 2020-07-10 DIAGNOSIS — C787 Secondary malignant neoplasm of liver and intrahepatic bile duct: Secondary | ICD-10-CM

## 2020-07-10 DIAGNOSIS — Z5112 Encounter for antineoplastic immunotherapy: Secondary | ICD-10-CM | POA: Diagnosis not present

## 2020-07-10 DIAGNOSIS — I480 Paroxysmal atrial fibrillation: Secondary | ICD-10-CM

## 2020-07-10 DIAGNOSIS — C189 Malignant neoplasm of colon, unspecified: Secondary | ICD-10-CM

## 2020-07-10 LAB — PROTIME-INR
INR: 2.5 — ABNORMAL HIGH (ref 0.8–1.2)
Prothrombin Time: 27.2 seconds — ABNORMAL HIGH (ref 11.4–15.2)

## 2020-07-10 NOTE — Telephone Encounter (Signed)
Per scheduling message 07/10/20 -called and spoke to patient's wife about upcoming appointment - confirmed

## 2020-07-11 ENCOUNTER — Other Ambulatory Visit: Payer: Self-pay | Admitting: Hematology & Oncology

## 2020-07-11 DIAGNOSIS — I10 Essential (primary) hypertension: Secondary | ICD-10-CM

## 2020-07-13 ENCOUNTER — Other Ambulatory Visit (HOSPITAL_COMMUNITY): Payer: Self-pay

## 2020-07-14 ENCOUNTER — Other Ambulatory Visit (HOSPITAL_COMMUNITY): Payer: Self-pay

## 2020-07-15 ENCOUNTER — Telehealth: Payer: Self-pay

## 2020-07-15 ENCOUNTER — Inpatient Hospital Stay: Payer: Medicare HMO

## 2020-07-15 ENCOUNTER — Other Ambulatory Visit: Payer: Self-pay

## 2020-07-15 DIAGNOSIS — Z5112 Encounter for antineoplastic immunotherapy: Secondary | ICD-10-CM | POA: Diagnosis not present

## 2020-07-15 DIAGNOSIS — I480 Paroxysmal atrial fibrillation: Secondary | ICD-10-CM

## 2020-07-15 LAB — PROTIME-INR
INR: 3.5 — ABNORMAL HIGH (ref 0.8–1.2)
Prothrombin Time: 35.4 seconds — ABNORMAL HIGH (ref 11.4–15.2)

## 2020-07-15 NOTE — Telephone Encounter (Signed)
Called patient, he is taking 4mg  one day and 2mg  next day. He is also currently on Bactrim for a skin infection and his last dose is tomorrow. Informed MD. Dr.Ennever would like patient to stop coumadin and restart day after last abx. (Restart Friday7/15) taking again 45mfgone day 2mg  next. Called and informed patients wife who verbalized understanding.   She also states she received tempus forms in the mail but says to disregard if we filled them out. Informed patient they were done in our office so she could disregard. She verbalized understanding and denies any other questions or concerns at this time.

## 2020-07-15 NOTE — Telephone Encounter (Signed)
-----   Message from Zachary Napoleon, MD sent at 07/15/2020 11:31 AM EDT ----- Please call and tell him that the INR is 3.5.  How much Coumadin is he taking?  [name]

## 2020-07-16 ENCOUNTER — Ambulatory Visit: Payer: Medicare HMO

## 2020-07-16 ENCOUNTER — Other Ambulatory Visit: Payer: Medicare HMO

## 2020-07-16 ENCOUNTER — Ambulatory Visit: Payer: Medicare HMO | Admitting: Hematology & Oncology

## 2020-07-20 ENCOUNTER — Other Ambulatory Visit: Payer: Self-pay

## 2020-07-20 ENCOUNTER — Inpatient Hospital Stay: Payer: Medicare HMO

## 2020-07-20 ENCOUNTER — Other Ambulatory Visit (HOSPITAL_COMMUNITY): Payer: Self-pay

## 2020-07-20 ENCOUNTER — Inpatient Hospital Stay (HOSPITAL_BASED_OUTPATIENT_CLINIC_OR_DEPARTMENT_OTHER): Payer: Medicare HMO | Admitting: Hematology & Oncology

## 2020-07-20 VITALS — BP 177/84 | HR 62 | Temp 98.3°F | Resp 20 | Wt 260.0 lb

## 2020-07-20 DIAGNOSIS — C787 Secondary malignant neoplasm of liver and intrahepatic bile duct: Secondary | ICD-10-CM

## 2020-07-20 DIAGNOSIS — C189 Malignant neoplasm of colon, unspecified: Secondary | ICD-10-CM

## 2020-07-20 DIAGNOSIS — Z5112 Encounter for antineoplastic immunotherapy: Secondary | ICD-10-CM | POA: Diagnosis not present

## 2020-07-20 LAB — CBC WITH DIFFERENTIAL (CANCER CENTER ONLY)
Abs Immature Granulocytes: 0.09 10*3/uL — ABNORMAL HIGH (ref 0.00–0.07)
Basophils Absolute: 0 10*3/uL (ref 0.0–0.1)
Basophils Relative: 1 %
Eosinophils Absolute: 0.2 10*3/uL (ref 0.0–0.5)
Eosinophils Relative: 3 %
HCT: 38.9 % — ABNORMAL LOW (ref 39.0–52.0)
Hemoglobin: 11.9 g/dL — ABNORMAL LOW (ref 13.0–17.0)
Immature Granulocytes: 2 %
Lymphocytes Relative: 8 %
Lymphs Abs: 0.4 10*3/uL — ABNORMAL LOW (ref 0.7–4.0)
MCH: 30.4 pg (ref 26.0–34.0)
MCHC: 30.6 g/dL (ref 30.0–36.0)
MCV: 99.5 fL (ref 80.0–100.0)
Monocytes Absolute: 0.4 10*3/uL (ref 0.1–1.0)
Monocytes Relative: 9 %
Neutro Abs: 3.7 10*3/uL (ref 1.7–7.7)
Neutrophils Relative %: 77 %
Platelet Count: 75 10*3/uL — ABNORMAL LOW (ref 150–400)
RBC: 3.91 MIL/uL — ABNORMAL LOW (ref 4.22–5.81)
RDW: 19 % — ABNORMAL HIGH (ref 11.5–15.5)
WBC Count: 4.8 10*3/uL (ref 4.0–10.5)
nRBC: 1 % — ABNORMAL HIGH (ref 0.0–0.2)

## 2020-07-20 LAB — CMP (CANCER CENTER ONLY)
ALT: 86 U/L — ABNORMAL HIGH (ref 0–44)
AST: 91 U/L — ABNORMAL HIGH (ref 15–41)
Albumin: 3.2 g/dL — ABNORMAL LOW (ref 3.5–5.0)
Alkaline Phosphatase: 404 U/L — ABNORMAL HIGH (ref 38–126)
Anion gap: 6 (ref 5–15)
BUN: 20 mg/dL (ref 8–23)
CO2: 31 mmol/L (ref 22–32)
Calcium: 9.1 mg/dL (ref 8.9–10.3)
Chloride: 107 mmol/L (ref 98–111)
Creatinine: 0.99 mg/dL (ref 0.61–1.24)
GFR, Estimated: >60 mL/min (ref >60)
Glucose, Bld: 130 mg/dL — ABNORMAL HIGH (ref 70–99)
Potassium: 4.2 mmol/L (ref 3.5–5.1)
Sodium: 144 mmol/L (ref 135–145)
Total Bilirubin: 1.6 mg/dL — ABNORMAL HIGH (ref 0.3–1.2)
Total Protein: 5.9 g/dL — ABNORMAL LOW (ref 6.5–8.1)

## 2020-07-20 LAB — PROTIME-INR
INR: 1.7 — ABNORMAL HIGH (ref 0.8–1.2)
Prothrombin Time: 19.8 seconds — ABNORMAL HIGH (ref 11.4–15.2)

## 2020-07-20 LAB — IRON AND TIBC
Iron: 76 ug/dL (ref 42–163)
Saturation Ratios: 35 % (ref 20–55)
TIBC: 219 ug/dL (ref 202–409)
UIBC: 143 ug/dL (ref 117–376)

## 2020-07-20 LAB — FERRITIN: Ferritin: 304 ng/mL (ref 24–336)

## 2020-07-20 LAB — CEA (IN HOUSE-CHCC): CEA (CHCC-In House): 1833.38 ng/mL — ABNORMAL HIGH (ref 0.00–5.00)

## 2020-07-20 NOTE — Progress Notes (Signed)
DISCONTINUE OFF PATHWAY REGIMEN - Colorectal   [id number]:Bevacizumab 7.5 mg/kg q21 days:   A cycle is every 21 days:     Bevacizumab-xxxx   **Always confirm dose/schedule in your pharmacy ordering system**  REASON: Disease Progression PRIOR TREATMENT: Off Pathway: Bevacizumab 7.5 mg/kg q21 days TREATMENT RESPONSE: Partial Response (PR)  START ON PATHWAY REGIMEN - Colorectal     A cycle is every 14 days:     Oxaliplatin      Leucovorin      Fluorouracil      Fluorouracil   **Always confirm dose/schedule in your pharmacy ordering system**  Patient Characteristics: Distant Metastases, Nonsurgical Candidate, KRAS/NRAS Mutation Positive/Unknown (BRAF V600 Wild-Type/Unknown), Standard Cytotoxic Therapy, Third Line Standard Cytotoxic Therapy Tumor Location: Colon Therapeutic Status: Distant Metastases Microsatellite/Mismatch Repair Status: Unknown BRAF Mutation Status: Wild-Type (no mutation) KRAS/NRAS Mutation Status: Mutation Positive Standard Cytotoxic Line of Therapy: Third Building services engineer Cytotoxic Therapy Intent of Therapy: Non-Curative / Palliative Intent, Discussed with Patient

## 2020-07-20 NOTE — Progress Notes (Signed)
Hematology and Oncology Follow Up Visit  Zachary Willis [id number] 01-01-1946 75 y.o. 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  Unfortunately, he does have progressive disease.  We did try to get a liver biopsy.  Unfortunately this was not successful.  His tumor is K-ras mutated.  As such, we cannot use EGFR agents.  He has not had oxaliplatin.  I will try to avoid this because of potential neuropathy.  However, I think that we will have to try to use this.  I would try using FOLFOX with Avastin.    The probably has had has been cellulitis in his right lower leg.  This was quite severe.  He was not hospitalized.  He was on antibiotics.  He does seem to be getting better.  He has had no problems with his heart.  He is on Coumadin for atrial fibrillation.  His INR was 1.7.  I will not change his dose of Coumadin.  I think he alternates 4 mg with 2 mg daily.  His last CEA level was up to 1833.  He has had no change in bowel or bladder habits.  His wife recently had a myocardial infarction.  She actually is going for a cardiac cath next week.  We will certainly pray for her.  We did give her a prayer blanket.  Currently, his performance status is ECOG 2.     Medications:  Allergies as of 07/20/2020   No Known Allergies      Medication List        Accurate as of July 20, 2020  9:07 AM. If you have any questions, ask your nurse or  doctor.          STOP taking these medications    amoxicillin-clavulanate 875-125 MG tablet Commonly known as: AUGMENTIN Stopped by: PVolanda Napoleon MD   Lonsurf 15-6.14 MG tablet Generic drug: trifluridine-tipiracil Stopped by: PVolanda Napoleon MD   metolazone 5 MG tablet Commonly known as: ZAROXOLYN Stopped by: PVolanda Napoleon MD   Pfizer-BioNT COVID-19 Vac-TriS Susp injection Generic drug: COVID-19 mRNA Vac-TriS (Therapist, music Stopped by: PVolanda Napoleon MD       TAKE these medications    atorvastatin 40 MG tablet Commonly known as: LIPITOR Take 40 mg by mouth daily.   BD Pen Needle Nano U/F 32G X 4 MM Misc Generic drug: Insulin Pen Needle Use as needed with GLP1a pen What changed: Another medication with the same name was removed. Continue taking this medication, and follow the directions you see here. Changed by: PVolanda Napoleon MD   carvedilol 6.25 MG tablet Commonly known as: COREG Take 6.25 mg by mouth 2 (two) times daily with a meal.   ferrous sulfate 325 (65 FE) MG tablet Take 325 mg by mouth daily.   furosemide 40 MG tablet Commonly known as: LASIX Take 20 mg by mouth every other  day.   hydrocortisone 10 MG tablet Commonly known as: CORTEF Take 1-2 tablets (10-20 mg total) by mouth See admin instructions. 38m in AM 151min PM   ibuprofen 200 MG tablet Commonly known as: ADVIL Take 400 mg by mouth every 6 (six) hours as needed for headache or mild pain.   levothyroxine 88 MCG tablet Commonly known as: SYNTHROID Take 88 mcg by mouth daily before breakfast.   lidocaine-prilocaine cream Commonly known as: EMLA Apply to affected area once   NASAL WARetreatA Place 1 spray into the nose daily.   ondansetron 8 MG tablet Commonly known as: Zofran Take 1 tablet (8 mg total) by mouth every 4 (four) hours as needed for nausea.   OneTouch Verio test strip Generic drug: glucose blood 1 each 2 (two) times daily.   Ozempic (0.25 or 0.5 MG/DOSE) 2  MG/1.5ML Sopn Generic drug: Semaglutide(0.25 or 0.5MG/DOS) Inject into the skin once a week.   pantoprazole 40 MG tablet Commonly known as: PROTONIX Take 40 mg by mouth every morning.   polyvinyl alcohol 1.4 % ophthalmic solution Commonly known as: LIQUIFILM TEARS Place 1 drop into both eyes daily as needed for dry eyes.   Potassium Chloride ER 20 MEQ Tbcr Take 20 mEq by mouth daily.   telmisartan 80 MG tablet Commonly known as: MICARDIS TAKE 1 TABLET BY MOUTH EVERY DAY   testosterone 4 MG/24HR Pt24 patch Commonly known as: ANDRODERM Place 1 patch onto the skin daily.   warfarin 4 MG tablet Commonly known as: COUMADIN TAKE 1 TABLET BY MOUTH EVERY DAY What changed:  how much to take how to take this when to take this additional instructions        Allergies: No Known Allergies  Past Medical History, Surgical history, Social history, and Family History were reviewed and updated.  Review of Systems: Review of Systems  Constitutional: Negative.   HENT: Negative.    Eyes: Negative.   Respiratory: Negative.    Cardiovascular: Negative.   Gastrointestinal: Negative.   Genitourinary: Negative.   Musculoskeletal: Negative.   Skin: Negative.   Neurological: Negative.   Endo/Heme/Allergies: Negative.   Psychiatric/Behavioral: Negative.       Physical Exam:  weight is 260 lb (117.9 kg). His oral temperature is 98.3 F (36.8 C). His blood pressure is 177/84 (abnormal) and his pulse is 62. His respiration is 20 and oxygen saturation is 98%.   Wt Readings from Last 3 Encounters:  07/20/20 260 lb (117.9 kg)  06/03/20 256 lb (116.1 kg)  05/12/20 252 lb (114.3 kg)    Physical Exam Vitals reviewed.  HENT:     Head: Normocephalic and atraumatic.  Eyes:     Pupils: Pupils are equal, round, and reactive to light.  Cardiovascular:     Rate and Rhythm: Normal rate and regular rhythm.     Heart sounds: Normal heart sounds.     Comments: Cardiac exam shows a regular  rate and rhythm.  There is some occasional extra beat.  I do not hear any obvious atrial fibrillation. Pulmonary:     Effort: Pulmonary effort is normal.     Breath sounds: Normal breath sounds.  Abdominal:     General: Bowel sounds are normal.     Palpations: Abdomen is soft.     Comments: Abdominal exam shows laparotomy scars.  He has no fluid wave.  He has no guarding or rebound tenderness.  There is no palpable liver or spleen tip.  Musculoskeletal:  General: No tenderness or deformity. Normal range of motion.     Cervical back: Normal range of motion.     Comments: On the anterior aspect of the left lower leg, he does have the wound that is healing.  There is an eschar.  There is mild edema.  No erythema is noted.  Lymphadenopathy:     Cervical: No cervical adenopathy.  Skin:    General: Skin is warm and dry.     Findings: No erythema or rash.  Neurological:     Mental Status: He is alert and oriented to person, place, and time.  Psychiatric:        Behavior: Behavior normal.        Thought Content: Thought content normal.        Judgment: Judgment normal.   Lab Results  Component Value Date   WBC 4.8 07/20/2020   HGB 11.9 (L) 07/20/2020   HCT 38.9 (L) 07/20/2020   MCV 99.5 07/20/2020   PLT 75 (L) 07/20/2020   Lab Results  Component Value Date   FERRITIN 239 04/09/2020   IRON 76 04/09/2020   TIBC 209 04/09/2020   UIBC 132 04/09/2020   IRONPCTSAT 37 04/09/2020   Lab Results  Component Value Date   RETICCTPCT 1.3 04/09/2020   RBC 3.91 (L) 07/20/2020   No results found for: KPAFRELGTCHN, LAMBDASER, KAPLAMBRATIO No results found for: IGGSERUM, IGA, IGMSERUM No results found for: Odetta Pink, SPEI   Chemistry      Component Value Date/Time   NA 143 07/01/2020 0905   K 3.6 07/01/2020 0905   CL 108 07/01/2020 0905   CO2 30 07/01/2020 0905   BUN 23 07/01/2020 0905   CREATININE 0.98 07/01/2020 0905       Component Value Date/Time   CALCIUM 8.8 (L) 07/01/2020 0905   ALKPHOS 274 (H) 07/01/2020 0905   AST 69 (H) 07/01/2020 0905   ALT 62 (H) 07/01/2020 0905   BILITOT 2.0 (H) 07/01/2020 0905      Impression and Plan: Zachary Willis is a very pleasant 75 yo caucasian gentleman with metastatic colon cancer.   Again, we will try him on FOLFOX/Avastin.  Hopefully, we will see some kind of response.  I think the CEA will certainly tell us how things are going.  We will try to get started this week.  I do not think the cellulitis is going be a problem for [place].  I would like to see him back for his next cycle in 2 weeks or so.       [name], MD 7/18/20229:07 AM

## 2020-07-22 ENCOUNTER — Inpatient Hospital Stay: Payer: Medicare HMO

## 2020-07-22 ENCOUNTER — Other Ambulatory Visit: Payer: Self-pay | Admitting: *Deleted

## 2020-07-22 ENCOUNTER — Other Ambulatory Visit: Payer: Self-pay

## 2020-07-22 VITALS — BP 183/83 | HR 74 | Temp 97.6°F | Resp 20

## 2020-07-22 DIAGNOSIS — C189 Malignant neoplasm of colon, unspecified: Secondary | ICD-10-CM

## 2020-07-22 DIAGNOSIS — C787 Secondary malignant neoplasm of liver and intrahepatic bile duct: Secondary | ICD-10-CM

## 2020-07-22 DIAGNOSIS — D693 Immune thrombocytopenic purpura: Secondary | ICD-10-CM

## 2020-07-22 DIAGNOSIS — Z5112 Encounter for antineoplastic immunotherapy: Secondary | ICD-10-CM | POA: Diagnosis not present

## 2020-07-22 DIAGNOSIS — D72820 Lymphocytosis (symptomatic): Secondary | ICD-10-CM

## 2020-07-22 LAB — URINALYSIS, COMPLETE (UACMP) WITH MICROSCOPIC
Bilirubin Urine: NEGATIVE
Glucose, UA: NEGATIVE mg/dL
Hgb urine dipstick: NEGATIVE
Ketones, ur: NEGATIVE mg/dL
Leukocytes,Ua: NEGATIVE
Nitrite: NEGATIVE
Protein, ur: NEGATIVE mg/dL
Specific Gravity, Urine: 1.015 (ref 1.005–1.030)
pH: 6 (ref 5.0–8.0)

## 2020-07-22 MED ORDER — DEXAMETHASONE 4 MG PO TABS: 8.0000 mg | ORAL_TABLET | Freq: Every day | ORAL | 1 refills | Status: DC

## 2020-07-22 MED ORDER — FLUOROURACIL CHEMO INJECTION 5 GM/100ML
1920.0000 mg/m2 | INTRAVENOUS | Status: DC
  Administered 2020-07-22: 4650 mg via INTRAVENOUS
  Filled 2020-07-22: qty 93

## 2020-07-22 MED ORDER — ONDANSETRON HCL 8 MG PO TABS: 8.0000 mg | ORAL_TABLET | Freq: Two times a day (BID) | ORAL | 1 refills | Status: DC | PRN

## 2020-07-22 MED ORDER — LEUCOVORIN CALCIUM INJECTION 350 MG
400.0000 mg/m2 | Freq: Once | INTRAVENOUS | Status: AC
  Administered 2020-07-22: 964 mg via INTRAVENOUS
  Filled 2020-07-22: qty 48.2

## 2020-07-22 MED ORDER — PALONOSETRON HCL INJECTION 0.25 MG/5ML
0.2500 mg | Freq: Once | INTRAVENOUS | Status: AC
  Administered 2020-07-22: 0.25 mg via INTRAVENOUS

## 2020-07-22 MED ORDER — SODIUM CHLORIDE 0.9 % IV SOLN
Freq: Once | INTRAVENOUS | Status: DC
  Filled 2020-07-22: qty 250

## 2020-07-22 MED ORDER — SODIUM CHLORIDE 0.9% FLUSH
10.0000 mL | INTRAVENOUS | Status: DC | PRN
  Filled 2020-07-22: qty 10

## 2020-07-22 MED ORDER — LORAZEPAM 0.5 MG PO TABS: 0.5000 mg | ORAL_TABLET | Freq: Four times a day (QID) | ORAL | 0 refills | Status: DC | PRN

## 2020-07-22 MED ORDER — OXALIPLATIN CHEMO INJECTION 100 MG/20ML
68.0000 mg/m2 | Freq: Once | INTRAVENOUS | Status: AC
  Administered 2020-07-22: 165 mg via INTRAVENOUS
  Filled 2020-07-22: qty 33

## 2020-07-22 MED ORDER — PROCHLORPERAZINE MALEATE 10 MG PO TABS: 10.0000 mg | ORAL_TABLET | Freq: Four times a day (QID) | ORAL | 1 refills | Status: DC | PRN

## 2020-07-22 MED ORDER — LIDOCAINE-PRILOCAINE 2.5-2.5 % EX CREA: TOPICAL_CREAM | CUTANEOUS | 3 refills | Status: DC

## 2020-07-22 MED ORDER — PALONOSETRON HCL INJECTION 0.25 MG/5ML
INTRAVENOUS | Status: AC
  Filled 2020-07-22: qty 5

## 2020-07-22 MED ORDER — SODIUM CHLORIDE 0.9 % IV SOLN
10.0000 mg | Freq: Once | INTRAVENOUS | Status: AC
  Administered 2020-07-22: 10 mg via INTRAVENOUS
  Filled 2020-07-22: qty 10

## 2020-07-22 MED ORDER — DEXTROSE 5 % IV SOLN
Freq: Once | INTRAVENOUS | Status: AC
  Filled 2020-07-22: qty 250

## 2020-07-22 MED ORDER — HEPARIN SOD (PORK) LOCK FLUSH 100 UNIT/ML IV SOLN
500.0000 [IU] | Freq: Once | INTRAVENOUS | Status: DC | PRN
  Filled 2020-07-22: qty 5

## 2020-07-22 MED ORDER — FLUOROURACIL CHEMO INJECTION 2.5 GM/50ML
400.0000 mg/m2 | Freq: Once | INTRAVENOUS | Status: AC
  Administered 2020-07-22: 950 mg via INTRAVENOUS
  Filled 2020-07-22: qty 19

## 2020-07-22 MED ORDER — SODIUM CHLORIDE 0.9 % IV SOLN
5.0000 mg/kg | Freq: Once | INTRAVENOUS | Status: AC
  Administered 2020-07-22: 600 mg via INTRAVENOUS
  Filled 2020-07-22: qty 16

## 2020-07-22 NOTE — Patient Instructions (Addendum)
[facility]  Discharge Instructions: Thank you for choosing [facility] to provide your oncology and hematology care.   If you have a lab appointment with the [facility], please go directly to the [facility] and check in at the registration area.  Wear comfortable clothing and clothing appropriate for easy access to any Portacath or PICC line.   We strive to give you quality time with your provider. You may need to reschedule your appointment if you arrive late (15 or more minutes).  Arriving late affects you and other patients whose appointments are after yours.  Also, if you miss three or more appointments without notifying the office, you may be dismissed from the clinic at the provider's discretion.      For prescription refill requests, have your pharmacy contact our office and allow 72 hours for refills to be completed.    Today you received the following chemotherapy and/or immunotherapy agents, OXALIPLATIN, LEUKOVORIN, 63fu AND AVASTIN   To help prevent nausea and vomiting after your treatment, we encourage you to take your nausea medication as directed.  BELOW ARE SYMPTOMS THAT SHOULD BE REPORTED IMMEDIATELY: *FEVER GREATER THAN 100.4 F (38 C) OR HIGHER *CHILLS OR SWEATING *NAUSEA AND VOMITING THAT IS NOT CONTROLLED WITH YOUR NAUSEA MEDICATION *UNUSUAL SHORTNESS OF BREATH *UNUSUAL BRUISING OR BLEEDING *URINARY PROBLEMS (pain or burning when urinating, or frequent urination) *BOWEL PROBLEMS (unusual diarrhea, constipation, pain near the anus) TENDERNESS IN MOUTH AND THROAT WITH OR WITHOUT PRESENCE OF ULCERS (sore throat, sores in mouth, or a toothache) UNUSUAL RASH, SWELLING OR PAIN  UNUSUAL VAGINAL DISCHARGE OR ITCHING   Items with * indicate a potential emergency and should be followed up as soon as possible or go to the Emergency Department if any problems should occur.  Please show the CHEMOTHERAPY ALERT CARD or IMMUNOTHERAPY  ALERT CARD at check-in to the Emergency Department and triage nurse. Should you have questions after your visit or need to cancel or reschedule your appointment, please contact [facility]  [phone] and follow the prompts.  Office hours are 8:00 a.m. to 4:30 p.m. Monday - Friday. Please note that voicemails left after 4:00 p.m. may not be returned until the following business day.  We are closed weekends and major holidays. You have access to a nurse at all times for urgent questions. Please call the main number to the clinic [phone] and follow the prompts.  For any non-urgent questions, you may also contact your provider using MyChart. We now offer e-Visits for anyone 40 and older to request care online for non-urgent symptoms. For details visit [email or web address].   Also download the MyChart app! Go to the app store, search "MyChart", open the app, select [facility], and log in with your MyChart username and password.  Due to Covid, a mask is required upon entering the hospital/clinic. If you do not have a mask, one will be given to you upon arrival. For doctor visits, patients may have 1 support person aged 25 or older with them. For treatment visits, patients cannot have anyone with them due to current Covid guidelines and our immunocompromised population.

## 2020-07-22 NOTE — Progress Notes (Signed)
Okay to use labs from 07/20/20, Dr. Marin Olp has reviewed CBC and CMET and has approved proceeding with treatment.

## 2020-07-24 ENCOUNTER — Other Ambulatory Visit: Payer: Self-pay

## 2020-07-24 ENCOUNTER — Inpatient Hospital Stay: Payer: Medicare HMO

## 2020-07-24 VITALS — BP 179/97 | HR 61 | Temp 98.0°F | Resp 19

## 2020-07-24 DIAGNOSIS — C189 Malignant neoplasm of colon, unspecified: Secondary | ICD-10-CM

## 2020-07-24 DIAGNOSIS — Z5112 Encounter for antineoplastic immunotherapy: Secondary | ICD-10-CM | POA: Diagnosis not present

## 2020-07-24 MED ORDER — SODIUM CHLORIDE 0.9% FLUSH
10.0000 mL | INTRAVENOUS | Status: DC | PRN
  Administered 2020-07-24: 10 mL
  Filled 2020-07-24: qty 10

## 2020-07-24 MED ORDER — HEPARIN SOD (PORK) LOCK FLUSH 100 UNIT/ML IV SOLN
500.0000 [IU] | Freq: Once | INTRAVENOUS | Status: AC | PRN
  Administered 2020-07-24: 500 [IU]
  Filled 2020-07-24: qty 5

## 2020-07-24 NOTE — Patient Instructions (Signed)
Fluorouracil, 5-FU injection What is this medication? FLUOROURACIL, 5-FU (flure oh YOOR a sil) is a chemotherapy drug. It slows the growth of cancer cells. This medicine is used to treat many types of cancer like breast cancer, colon or rectal cancer, pancreatic cancer, and stomach cancer. This medicine may be used for other purposes; ask your health care provider or pharmacist if you have questions. COMMON BRAND NAME(S): Adrucil What should I tell my care team before I take this medication? They need to know if you have any of these conditions: blood disorders dihydropyrimidine dehydrogenase (DPD) deficiency infection (especially a virus infection such as chickenpox, cold sores, or herpes) kidney disease liver disease malnourished, poor nutrition recent or ongoing radiation therapy an unusual or allergic reaction to fluorouracil, other chemotherapy, other medicines, foods, dyes, or preservatives pregnant or trying to get pregnant breast-feeding How should I use this medication? This drug is given as an infusion or injection into a vein. It is administered in a hospital or clinic by a specially trained health care professional. Talk to your pediatrician regarding the use of this medicine in children. Special care may be needed. Overdosage: If you think you have taken too much of this medicine contact a poison control center or emergency room at once. NOTE: This medicine is only for you. Do not share this medicine with others. What if I miss a dose? It is important not to miss your dose. Call your doctor or health care professional if you are unable to keep an appointment. What may interact with this medication? Do not take this medicine with any of the following medications: live virus vaccines This medicine may also interact with the following medications: medicines that treat or prevent blood clots like warfarin, enoxaparin, and dalteparin This list may not describe all possible  interactions. Give your health care provider a list of all the medicines, herbs, non-prescription drugs, or dietary supplements you use. Also tell them if you smoke, drink alcohol, or use illegal drugs. Some items may interact with your medicine. What should I watch for while using this medication? Visit your doctor for checks on your progress. This drug may make you feel generally unwell. This is not uncommon, as chemotherapy can affect healthy cells as well as cancer cells. Report any side effects. Continue your course of treatment even though you feel ill unless your doctor tells you to stop. In some cases, you may be given additional medicines to help with side effects. Follow all directions for their use. Call your doctor or health care professional for advice if you get a fever, chills or sore throat, or other symptoms of a cold or flu. Do not treat yourself. This drug decreases your body's ability to fight infections. Try to avoid being around people who are sick. This medicine may increase your risk to bruise or bleed. Call your doctor or health care professional if you notice any unusual bleeding. Be careful brushing and flossing your teeth or using a toothpick because you may get an infection or bleed more easily. If you have any dental work done, tell your dentist you are receiving this medicine. Avoid taking products that contain aspirin, acetaminophen, ibuprofen, naproxen, or ketoprofen unless instructed by your doctor. These medicines may hide a fever. Do not become pregnant while taking this medicine. Women should inform their doctor if they wish to become pregnant or think they might be pregnant. There is a potential for serious side effects to an unborn child. Talk to your health care   professional or pharmacist for more information. Do not breast-feed an infant while taking this medicine. Men should inform their doctor if they wish to father a child. This medicine may lower sperm  counts. Do not treat diarrhea with over the counter products. Contact your doctor if you have diarrhea that lasts more than 2 days or if it is severe and watery. This medicine can make you more sensitive to the sun. Keep out of the sun. If you cannot avoid being in the sun, wear protective clothing and use sunscreen. Do not use sun lamps or tanning beds/booths. What side effects may I notice from receiving this medication? Side effects that you should report to your doctor or health care professional as soon as possible: allergic reactions like skin rash, itching or hives, swelling of the face, lips, or tongue low blood counts - this medicine may decrease the number of white blood cells, red blood cells and platelets. You may be at increased risk for infections and bleeding. signs of infection - fever or chills, cough, sore throat, pain or difficulty passing urine signs of decreased platelets or bleeding - bruising, pinpoint red spots on the skin, black, tarry stools, blood in the urine signs of decreased red blood cells - unusually weak or tired, fainting spells, lightheadedness breathing problems changes in vision chest pain mouth sores nausea and vomiting pain, swelling, redness at site where injected pain, tingling, numbness in the hands or feet redness, swelling, or sores on hands or feet stomach pain unusual bleeding Side effects that usually do not require medical attention (report to your doctor or health care professional if they continue or are bothersome): changes in finger or toe nails diarrhea dry or itchy skin hair loss headache loss of appetite sensitivity of eyes to the light stomach upset unusually teary eyes This list may not describe all possible side effects. Call your doctor for medical advice about side effects. You may report side effects to FDA at 1-800-FDA-1088. Where should I keep my medication? This drug is given in a hospital or clinic and will not be  stored at home. NOTE: This sheet is a summary. It may not cover all possible information. If you have questions about this medicine, talk to your doctor, pharmacist, or health care provider.  2022 Elsevier/Gold Standard (2018-11-20 15:00:03)  

## 2020-07-29 ENCOUNTER — Other Ambulatory Visit: Payer: Self-pay | Admitting: *Deleted

## 2020-07-29 ENCOUNTER — Other Ambulatory Visit: Payer: Self-pay

## 2020-07-29 ENCOUNTER — Inpatient Hospital Stay: Payer: Medicare HMO

## 2020-07-29 ENCOUNTER — Other Ambulatory Visit (HOSPITAL_BASED_OUTPATIENT_CLINIC_OR_DEPARTMENT_OTHER): Payer: Self-pay

## 2020-07-29 ENCOUNTER — Telehealth: Payer: Self-pay | Admitting: *Deleted

## 2020-07-29 VITALS — BP 174/94 | HR 53 | Temp 98.3°F | Resp 20

## 2020-07-29 DIAGNOSIS — C189 Malignant neoplasm of colon, unspecified: Secondary | ICD-10-CM

## 2020-07-29 DIAGNOSIS — Z5112 Encounter for antineoplastic immunotherapy: Secondary | ICD-10-CM | POA: Diagnosis not present

## 2020-07-29 LAB — CMP (CANCER CENTER ONLY)
ALT: 57 U/L — ABNORMAL HIGH (ref 0–44)
AST: 47 U/L — ABNORMAL HIGH (ref 15–41)
Albumin: 3.1 g/dL — ABNORMAL LOW (ref 3.5–5.0)
Alkaline Phosphatase: 290 U/L — ABNORMAL HIGH (ref 38–126)
Anion gap: 4 — ABNORMAL LOW (ref 5–15)
BUN: 26 mg/dL — ABNORMAL HIGH (ref 8–23)
CO2: 34 mmol/L — ABNORMAL HIGH (ref 22–32)
Calcium: 8.9 mg/dL (ref 8.9–10.3)
Chloride: 106 mmol/L (ref 98–111)
Creatinine: 0.99 mg/dL (ref 0.61–1.24)
GFR, Estimated: >60 mL/min (ref >60)
Glucose, Bld: 215 mg/dL — ABNORMAL HIGH (ref 70–99)
Potassium: 4.9 mmol/L (ref 3.5–5.1)
Sodium: 144 mmol/L (ref 135–145)
Total Bilirubin: 2 mg/dL — ABNORMAL HIGH (ref 0.3–1.2)
Total Protein: 5.4 g/dL — ABNORMAL LOW (ref 6.5–8.1)

## 2020-07-29 LAB — CBC WITH DIFFERENTIAL (CANCER CENTER ONLY)
Abs Immature Granulocytes: 0.03 10*3/uL (ref 0.00–0.07)
Basophils Absolute: 0 10*3/uL (ref 0.0–0.1)
Basophils Relative: 0 %
Eosinophils Absolute: 0.1 10*3/uL (ref 0.0–0.5)
Eosinophils Relative: 3 %
HCT: 39.1 % (ref 39.0–52.0)
Hemoglobin: 12 g/dL — ABNORMAL LOW (ref 13.0–17.0)
Immature Granulocytes: 1 %
Lymphocytes Relative: 6 %
Lymphs Abs: 0.2 10*3/uL — ABNORMAL LOW (ref 0.7–4.0)
MCH: 30 pg (ref 26.0–34.0)
MCHC: 30.7 g/dL (ref 30.0–36.0)
MCV: 97.8 fL (ref 80.0–100.0)
Monocytes Absolute: 0.2 10*3/uL (ref 0.1–1.0)
Monocytes Relative: 5 %
Neutro Abs: 3.2 10*3/uL (ref 1.7–7.7)
Neutrophils Relative %: 85 %
Platelet Count: 44 10*3/uL — ABNORMAL LOW (ref 150–400)
RBC: 4 MIL/uL — ABNORMAL LOW (ref 4.22–5.81)
RDW: 17.7 % — ABNORMAL HIGH (ref 11.5–15.5)
WBC Count: 3.8 10*3/uL — ABNORMAL LOW (ref 4.0–10.5)
nRBC: 0 % (ref 0.0–0.2)

## 2020-07-29 LAB — PROTIME-INR
INR: 2.3 — ABNORMAL HIGH (ref 0.8–1.2)
Prothrombin Time: 25.5 seconds — ABNORMAL HIGH (ref 11.4–15.2)

## 2020-07-29 MED ORDER — HEPARIN SOD (PORK) LOCK FLUSH 100 UNIT/ML IV SOLN
500.0000 [IU] | Freq: Once | INTRAVENOUS | Status: AC
  Administered 2020-07-29: 500 [IU] via INTRAVENOUS
  Filled 2020-07-29: qty 5

## 2020-07-29 MED ORDER — SODIUM CHLORIDE 0.9% FLUSH
10.0000 mL | Freq: Once | INTRAVENOUS | Status: AC
  Administered 2020-07-29: 10 mL via INTRAVENOUS
  Filled 2020-07-29: qty 10

## 2020-07-29 MED ORDER — SODIUM CHLORIDE 0.9 % IV SOLN
INTRAVENOUS | Status: DC
  Filled 2020-07-29 (×2): qty 250

## 2020-07-29 MED ORDER — VENLAFAXINE HCL 37.5 MG PO TABS
37.5000 mg | ORAL_TABLET | Freq: Every day | ORAL | 1 refills | Status: AC
  Filled 2020-07-29: qty 30, 30d supply, fill #0

## 2020-07-29 NOTE — Patient Instructions (Signed)

## 2020-07-29 NOTE — Telephone Encounter (Signed)
Call received from patient's wife, Meredith Mody stating that they just left this office for lab work and that pt is becoming increasingly fatigued with leg weakness.  Dr. Marin Olp notified and would like for pt to come back now for IVF's.  [name] states that she will bring pt back now and is appreciative of assistance.

## 2020-07-31 ENCOUNTER — Other Ambulatory Visit (HOSPITAL_COMMUNITY): Payer: Self-pay

## 2020-08-05 ENCOUNTER — Inpatient Hospital Stay: Payer: Medicare HMO | Attending: Hematology & Oncology

## 2020-08-05 ENCOUNTER — Inpatient Hospital Stay: Payer: Medicare HMO

## 2020-08-05 ENCOUNTER — Other Ambulatory Visit: Payer: Self-pay

## 2020-08-05 ENCOUNTER — Inpatient Hospital Stay (HOSPITAL_BASED_OUTPATIENT_CLINIC_OR_DEPARTMENT_OTHER): Payer: Medicare HMO | Admitting: Hematology & Oncology

## 2020-08-05 ENCOUNTER — Telehealth: Payer: Self-pay

## 2020-08-05 VITALS — BP 163/94 | HR 53 | Temp 98.2°F | Resp 24 | Wt 268.0 lb

## 2020-08-05 DIAGNOSIS — D693 Immune thrombocytopenic purpura: Secondary | ICD-10-CM | POA: Insufficient documentation

## 2020-08-05 DIAGNOSIS — C8510 Unspecified B-cell lymphoma, unspecified site: Secondary | ICD-10-CM | POA: Insufficient documentation

## 2020-08-05 DIAGNOSIS — C787 Secondary malignant neoplasm of liver and intrahepatic bile duct: Secondary | ICD-10-CM

## 2020-08-05 DIAGNOSIS — I480 Paroxysmal atrial fibrillation: Secondary | ICD-10-CM | POA: Diagnosis not present

## 2020-08-05 DIAGNOSIS — C189 Malignant neoplasm of colon, unspecified: Secondary | ICD-10-CM | POA: Diagnosis not present

## 2020-08-05 LAB — CBC WITH DIFFERENTIAL (CANCER CENTER ONLY)
Abs Immature Granulocytes: 0.02 10*3/uL (ref 0.00–0.07)
Basophils Absolute: 0 10*3/uL (ref 0.0–0.1)
Basophils Relative: 0 %
Eosinophils Absolute: 0.2 10*3/uL (ref 0.0–0.5)
Eosinophils Relative: 4 %
HCT: 42.3 % (ref 39.0–52.0)
Hemoglobin: 13.2 g/dL (ref 13.0–17.0)
Immature Granulocytes: 0 %
Lymphocytes Relative: 9 %
Lymphs Abs: 0.4 10*3/uL — ABNORMAL LOW (ref 0.7–4.0)
MCH: 29.9 pg (ref 26.0–34.0)
MCHC: 31.2 g/dL (ref 30.0–36.0)
MCV: 95.9 fL (ref 80.0–100.0)
Monocytes Absolute: 0.3 10*3/uL (ref 0.1–1.0)
Monocytes Relative: 6 %
Neutro Abs: 3.8 10*3/uL (ref 1.7–7.7)
Neutrophils Relative %: 81 %
Platelet Count: 53 10*3/uL — ABNORMAL LOW (ref 150–400)
RBC: 4.41 MIL/uL (ref 4.22–5.81)
RDW: 18.6 % — ABNORMAL HIGH (ref 11.5–15.5)
WBC Count: 4.7 10*3/uL (ref 4.0–10.5)
nRBC: 1.1 % — ABNORMAL HIGH (ref 0.0–0.2)

## 2020-08-05 LAB — CMP (CANCER CENTER ONLY)
ALT: 64 U/L — ABNORMAL HIGH (ref 0–44)
AST: 64 U/L — ABNORMAL HIGH (ref 15–41)
Albumin: 3.1 g/dL — ABNORMAL LOW (ref 3.5–5.0)
Alkaline Phosphatase: 377 U/L — ABNORMAL HIGH (ref 38–126)
Anion gap: 4 — ABNORMAL LOW (ref 5–15)
BUN: 17 mg/dL (ref 8–23)
CO2: 34 mmol/L — ABNORMAL HIGH (ref 22–32)
Calcium: 9 mg/dL (ref 8.9–10.3)
Chloride: 107 mmol/L (ref 98–111)
Creatinine: 0.96 mg/dL (ref 0.61–1.24)
GFR, Estimated: >60 mL/min (ref >60)
Glucose, Bld: 109 mg/dL — ABNORMAL HIGH (ref 70–99)
Potassium: 4.3 mmol/L (ref 3.5–5.1)
Sodium: 145 mmol/L (ref 135–145)
Total Bilirubin: 2.5 mg/dL — ABNORMAL HIGH (ref 0.3–1.2)
Total Protein: 5.3 g/dL — ABNORMAL LOW (ref 6.5–8.1)

## 2020-08-05 LAB — PROTIME-INR
INR: 3 — ABNORMAL HIGH (ref 0.8–1.2)
Prothrombin Time: 31.2 seconds — ABNORMAL HIGH (ref 11.4–15.2)

## 2020-08-05 NOTE — Progress Notes (Signed)
Hematology and Oncology Follow Up Visit  KAZ AULD [id number] 04/30/1945 75 y.o. 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 DDrummondsis here today for follow-up.  Unfortunately, he is not feeling all that well.  He does have a tough time with the chemotherapy.  He is just weak.  He is not eating that much.  He has had no bleeding.  There is been no diarrhea.  He has had no mouth sores.  He has these skin lesions.  That almost looks like fungal lesions.  I will put him on some Diflucan and see if this may help.  He is on Coumadin because of atrial fibrillation.  His INR today was 3.  I told his wife how to adjust his Coumadin dose downward a little bit.  He has had some leg swelling.  His weight is up 6 pounds.  I worry that this might be reflective of his liver having more difficulty.  He has had no problems with fever.  He has had no obvious bleeding.  He has had no headache.  His last CEA level was over 1800.  I just do not think that he is going to be able to take treatment today.  I do still think he is strong enough.  We might be looking at a situation that we might not be able to treat him any longer.  I noted that his LFTs are higher.  This troubles me.  Overall, I believe that his performance status is probably ECOG 2, at best.     Medications:  Allergies as of 08/05/2020   No Known Allergies      Medication List        Accurate as of August 05, 2020 12:29 PM. If you have any questions, ask your nurse or doctor.          atorvastatin 40 MG tablet Commonly known as: LIPITOR Take 40 mg by mouth daily.   BD Pen Needle Nano U/F 32G X 4 MM Misc Generic drug: Insulin Pen Needle Use as needed with GLP1a pen   carvedilol 6.25 MG tablet Commonly known as: COREG Take 6.25 mg by mouth 2 (two) times daily with a meal.   dexamethasone 4 MG tablet Commonly known as: DECADRON Take 2 tablets (8 mg total) by mouth daily. Start the day after chemotherapy for 2 days. Take with food.   ferrous sulfate 325 (65 FE) MG tablet Take 325 mg by mouth daily.   furosemide 40 MG tablet Commonly known as: LASIX Take 20 mg by mouth every other day.   hydrocortisone 10 MG tablet Commonly known as: CORTEF Take 1-2 tablets (10-20 mg total) by mouth See admin instructions. 29min AM 1020mn PM   ibuprofen 200 MG tablet Commonly known as: ADVIL Take 400 mg by mouth every 6 (six) hours as needed for headache or mild pain.   levothyroxine 88 MCG  tablet Commonly known as: SYNTHROID Take 88 mcg by mouth daily before breakfast.   lidocaine-prilocaine cream Commonly known as: EMLA Apply to affected area once   LORazepam 0.5 MG tablet Commonly known as: Ativan Take 1 tablet (0.5 mg total) by mouth every 6 (six) hours as needed (Nausea or vomiting).   NASAL Omar NA Place 1 spray into the nose daily.   ondansetron 8 MG tablet Commonly known as: Zofran Take 1 tablet (8 mg total) by mouth every 4 (four) hours as needed for nausea.   ondansetron 8 MG tablet Commonly known as: Zofran Take 1 tablet (8 mg total) by mouth 2 (two) times daily as needed for refractory nausea / vomiting. Start on day 3 after chemotherapy.   OneTouch Delica Plus [id number] Misc Apply topically 2 (two) times daily.   OneTouch Verio test strip Generic drug: glucose blood 1 each 2 (two) times daily.   Ozempic (0.25 or 0.5 MG/DOSE) 2 MG/1.5ML Sopn Generic drug:  Semaglutide(0.25 or 0.5MG/DOS) Inject into the skin once a week.   pantoprazole 40 MG tablet Commonly known as: PROTONIX Take 40 mg by mouth every morning.   polyvinyl alcohol 1.4 % ophthalmic solution Commonly known as: LIQUIFILM TEARS Place 1 drop into both eyes daily as needed for dry eyes.   Potassium Chloride ER 20 MEQ Tbcr Take 20 mEq by mouth 2 (two) times daily.   prochlorperazine 10 MG tablet Commonly known as: COMPAZINE Take 1 tablet (10 mg total) by mouth every 6 (six) hours as needed (Nausea or vomiting).   telmisartan 80 MG tablet Commonly known as: MICARDIS TAKE 1 TABLET BY MOUTH EVERY DAY   testosterone 4 MG/24HR Pt24 patch Commonly known as: ANDRODERM Place 1 patch onto the skin daily.   venlafaxine 37.5 MG tablet Commonly known as: EFFEXOR Take 1 tablet (37.5 mg total) by mouth daily.   warfarin 4 MG tablet Commonly known as: COUMADIN TAKE 1 TABLET BY MOUTH EVERY DAY What changed:  how much to take how to take this when to take this additional instructions        Allergies: No Known Allergies  Past Medical History, Surgical history, Social history, and Family History were reviewed and updated.  Review of Systems: Review of Systems  Constitutional: Negative.   HENT: Negative.    Eyes: Negative.   Respiratory: Negative.    Cardiovascular: Negative.   Gastrointestinal: Negative.   Genitourinary: Negative.   Musculoskeletal: Negative.   Skin: Negative.   Neurological: Negative.   Endo/Heme/Allergies: Negative.   Psychiatric/Behavioral: Negative.       Physical Exam:  weight is 268 lb (121.6 kg). His oral temperature is 98.2 F (36.8 C). His blood pressure is 163/94 (abnormal) and his pulse is 53 (abnormal). His respiration is 24 (abnormal) and oxygen saturation is 93%.   Wt Readings from Last 3 Encounters:  08/05/20 268 lb (121.6 kg)  07/20/20 260 lb (117.9 kg)  06/03/20 256 lb (116.1 kg)    Physical Exam Vitals reviewed.  HENT:      Head: Normocephalic and atraumatic.  Eyes:     Pupils: Pupils are equal, round, and reactive to light.  Cardiovascular:     Rate and Rhythm: Normal rate and regular rhythm.     Heart sounds: Normal heart sounds.     Comments: Cardiac exam shows a regular rate and rhythm.  There is some occasional extra beat.  I do not hear any obvious atrial fibrillation. Pulmonary:     Effort: Pulmonary effort is normal.  Breath sounds: Normal breath sounds.  Abdominal:     General: Bowel sounds are normal.     Palpations: Abdomen is soft.     Comments: Abdominal exam shows laparotomy scars.  He has no fluid wave.  He has no guarding or rebound tenderness.  There is no palpable liver or spleen tip.  Musculoskeletal:        General: No tenderness or deformity. Normal range of motion.     Cervical back: Normal range of motion.     Comments: On the anterior aspect of the left lower leg, he does have the wound that is healing.  There is an eschar.  There is mild edema.  No erythema is noted.  Lymphadenopathy:     Cervical: No cervical adenopathy.  Skin:    General: Skin is warm and dry.     Findings: No erythema or rash.  Neurological:     Mental Status: He is alert and oriented to person, place, and time.  Psychiatric:        Behavior: Behavior normal.        Thought Content: Thought content normal.        Judgment: Judgment normal.   Lab Results  Component Value Date   WBC 4.7 08/05/2020   HGB 13.2 08/05/2020   HCT 42.3 08/05/2020   MCV 95.9 08/05/2020   PLT 53 (L) 08/05/2020   Lab Results  Component Value Date   FERRITIN 304 07/20/2020   IRON 76 07/20/2020   TIBC 219 07/20/2020   UIBC 143 07/20/2020   IRONPCTSAT 35 07/20/2020   Lab Results  Component Value Date   RETICCTPCT 1.3 04/09/2020   RBC 4.41 08/05/2020   No results found for: KPAFRELGTCHN, LAMBDASER, KAPLAMBRATIO No results found for: IGGSERUM, IGA, IGMSERUM No results found for: Odetta Pink, SPEI   Chemistry      Component Value Date/Time   NA 145 08/05/2020 0941   K 4.3 08/05/2020 0941   CL 107 08/05/2020 0941   CO2 34 (H) 08/05/2020 0941   BUN 17 08/05/2020 0941   CREATININE 0.96 08/05/2020 0941      Component Value Date/Time   CALCIUM 9.0 08/05/2020 0941   ALKPHOS 377 (H) 08/05/2020 0941   AST 64 (H) 08/05/2020 0941   ALT 64 (H) 08/05/2020 0941   BILITOT 2.5 (H) 08/05/2020 0941      Impression and Plan: Mr. Nuno is a very pleasant 75 yo caucasian gentleman with metastatic colon cancer.   I am not can give him treatment today.  I worry that his liver is worsening.  This definitely is a troublesome situation.  We can had to have him come back next week so we can see what his INR is.  Again, if his LFTs are worsened when we see him back, I think we will have to talk about pulling back on treatment and thinking about comfort issues and maybe getting Hospice involved.  I know he is done all that we have asked him to do.  Given that his tumor is K-ras positive, I just would not be able to use any EGFR agents.  I will plan to try to get him back in 2 weeks.    [name], MD 8/3/202212:29 PM

## 2020-08-06 ENCOUNTER — Other Ambulatory Visit: Payer: Self-pay

## 2020-08-06 LAB — CEA (IN HOUSE-CHCC): CEA (CHCC-In House): 2967.29 ng/mL — ABNORMAL HIGH (ref 0.00–5.00)

## 2020-08-06 MED ORDER — FLUCONAZOLE 100 MG PO TABS: 100.0000 mg | ORAL_TABLET | Freq: Every day | ORAL | 0 refills | Status: DC

## 2020-08-07 ENCOUNTER — Inpatient Hospital Stay: Payer: Medicare HMO

## 2020-08-10 ENCOUNTER — Other Ambulatory Visit: Payer: Self-pay

## 2020-08-10 ENCOUNTER — Inpatient Hospital Stay: Payer: Medicare HMO

## 2020-08-10 DIAGNOSIS — C787 Secondary malignant neoplasm of liver and intrahepatic bile duct: Secondary | ICD-10-CM | POA: Diagnosis not present

## 2020-08-10 DIAGNOSIS — C189 Malignant neoplasm of colon, unspecified: Secondary | ICD-10-CM

## 2020-08-10 LAB — CBC WITH DIFFERENTIAL (CANCER CENTER ONLY)
Abs Immature Granulocytes: 0.05 10*3/uL (ref 0.00–0.07)
Basophils Absolute: 0 10*3/uL (ref 0.0–0.1)
Basophils Relative: 2 %
Eosinophils Absolute: 0.1 10*3/uL (ref 0.0–0.5)
Eosinophils Relative: 6 %
HCT: 40.4 % (ref 39.0–52.0)
Hemoglobin: 12.6 g/dL — ABNORMAL LOW (ref 13.0–17.0)
Immature Granulocytes: 4 %
Lymphocytes Relative: 31 %
Lymphs Abs: 0.4 10*3/uL — ABNORMAL LOW (ref 0.7–4.0)
MCH: 30 pg (ref 26.0–34.0)
MCHC: 31.2 g/dL (ref 30.0–36.0)
MCV: 96.2 fL (ref 80.0–100.0)
Monocytes Absolute: 0.3 10*3/uL (ref 0.1–1.0)
Monocytes Relative: 23 %
Neutro Abs: 0.5 10*3/uL — ABNORMAL LOW (ref 1.7–7.7)
Neutrophils Relative %: 34 %
Platelet Count: 62 10*3/uL — ABNORMAL LOW (ref 150–400)
RBC: 4.2 MIL/uL — ABNORMAL LOW (ref 4.22–5.81)
RDW: 18 % — ABNORMAL HIGH (ref 11.5–15.5)
WBC Count: 1.3 10*3/uL — ABNORMAL LOW (ref 4.0–10.5)
nRBC: 3.7 % — ABNORMAL HIGH (ref 0.0–0.2)

## 2020-08-10 LAB — CMP (CANCER CENTER ONLY)
ALT: 66 U/L — ABNORMAL HIGH (ref 0–44)
AST: 68 U/L — ABNORMAL HIGH (ref 15–41)
Albumin: 2.9 g/dL — ABNORMAL LOW (ref 3.5–5.0)
Alkaline Phosphatase: 402 U/L — ABNORMAL HIGH (ref 38–126)
Anion gap: 7 (ref 5–15)
BUN: 12 mg/dL (ref 8–23)
CO2: 34 mmol/L — ABNORMAL HIGH (ref 22–32)
Calcium: 8.7 mg/dL — ABNORMAL LOW (ref 8.9–10.3)
Chloride: 106 mmol/L (ref 98–111)
Creatinine: 1.05 mg/dL (ref 0.61–1.24)
GFR, Estimated: >60 mL/min (ref >60)
Glucose, Bld: 164 mg/dL — ABNORMAL HIGH (ref 70–99)
Potassium: 4.2 mmol/L (ref 3.5–5.1)
Sodium: 147 mmol/L — ABNORMAL HIGH (ref 135–145)
Total Bilirubin: 2 mg/dL — ABNORMAL HIGH (ref 0.3–1.2)
Total Protein: 4.9 g/dL — ABNORMAL LOW (ref 6.5–8.1)

## 2020-08-11 ENCOUNTER — Other Ambulatory Visit: Payer: Self-pay | Admitting: Hematology & Oncology

## 2020-08-11 ENCOUNTER — Telehealth: Payer: Self-pay

## 2020-08-11 DIAGNOSIS — R791 Abnormal coagulation profile: Secondary | ICD-10-CM

## 2020-08-11 DIAGNOSIS — I10 Essential (primary) hypertension: Secondary | ICD-10-CM

## 2020-08-11 NOTE — Telephone Encounter (Signed)
Patients wife called asking if INR was drawn with his other labwork yesterday. Called patient back and informed her only a CBC and CMET were drawn but according to Dr.Ennevers last note he did need his INR checked this week. Patients wife was okay bringing him back this week to check as he is on a new medication currently that could interact with his warfarin. Scheduling message sent, she preferred coming in tomorrow 8/10 at 0830. Order placed for INR only.

## 2020-08-12 ENCOUNTER — Other Ambulatory Visit: Payer: Self-pay

## 2020-08-12 ENCOUNTER — Inpatient Hospital Stay: Payer: Medicare HMO

## 2020-08-12 ENCOUNTER — Telehealth: Payer: Self-pay

## 2020-08-12 DIAGNOSIS — R791 Abnormal coagulation profile: Secondary | ICD-10-CM

## 2020-08-12 DIAGNOSIS — C189 Malignant neoplasm of colon, unspecified: Secondary | ICD-10-CM

## 2020-08-12 DIAGNOSIS — C787 Secondary malignant neoplasm of liver and intrahepatic bile duct: Secondary | ICD-10-CM | POA: Diagnosis not present

## 2020-08-12 LAB — CBC WITH DIFFERENTIAL (CANCER CENTER ONLY)
Abs Immature Granulocytes: 0.17 10*3/uL — ABNORMAL HIGH (ref 0.00–0.07)
Basophils Absolute: 0 10*3/uL (ref 0.0–0.1)
Basophils Relative: 1 %
Eosinophils Absolute: 0.1 10*3/uL (ref 0.0–0.5)
Eosinophils Relative: 4 %
HCT: 42.4 % (ref 39.0–52.0)
Hemoglobin: 13.3 g/dL (ref 13.0–17.0)
Immature Granulocytes: 7 %
Lymphocytes Relative: 21 %
Lymphs Abs: 0.5 10*3/uL — ABNORMAL LOW (ref 0.7–4.0)
MCH: 30 pg (ref 26.0–34.0)
MCHC: 31.4 g/dL (ref 30.0–36.0)
MCV: 95.5 fL (ref 80.0–100.0)
Monocytes Absolute: 0.4 10*3/uL (ref 0.1–1.0)
Monocytes Relative: 14 %
Neutro Abs: 1.3 10*3/uL — ABNORMAL LOW (ref 1.7–7.7)
Neutrophils Relative %: 53 %
Platelet Count: 62 10*3/uL — ABNORMAL LOW (ref 150–400)
RBC: 4.44 MIL/uL (ref 4.22–5.81)
RDW: 18.1 % — ABNORMAL HIGH (ref 11.5–15.5)
WBC Count: 2.5 10*3/uL — ABNORMAL LOW (ref 4.0–10.5)
nRBC: 4.3 % — ABNORMAL HIGH (ref 0.0–0.2)

## 2020-08-12 LAB — CMP (CANCER CENTER ONLY)
ALT: 58 U/L — ABNORMAL HIGH (ref 0–44)
AST: 58 U/L — ABNORMAL HIGH (ref 15–41)
Albumin: 2.9 g/dL — ABNORMAL LOW (ref 3.5–5.0)
Alkaline Phosphatase: 365 U/L — ABNORMAL HIGH (ref 38–126)
Anion gap: 7 (ref 5–15)
BUN: 17 mg/dL (ref 8–23)
CO2: 33 mmol/L — ABNORMAL HIGH (ref 22–32)
Calcium: 8.9 mg/dL (ref 8.9–10.3)
Chloride: 105 mmol/L (ref 98–111)
Creatinine: 1.06 mg/dL (ref 0.61–1.24)
GFR, Estimated: >60 mL/min (ref >60)
Glucose, Bld: 213 mg/dL — ABNORMAL HIGH (ref 70–99)
Potassium: 3.9 mmol/L (ref 3.5–5.1)
Sodium: 145 mmol/L (ref 135–145)
Total Bilirubin: 2 mg/dL — ABNORMAL HIGH (ref 0.3–1.2)
Total Protein: 5 g/dL — ABNORMAL LOW (ref 6.5–8.1)

## 2020-08-12 LAB — PROTIME-INR
INR: >4 — ABNORMAL HIGH (ref 0.8–1.2)
Prothrombin Time: 56.4 seconds — ABNORMAL HIGH (ref 11.4–15.2)

## 2020-08-12 NOTE — Telephone Encounter (Signed)
Critical INR reported to Dr Marin Olp. Pt is currently taking Coumadin alternating '4mg'$ , '2mg'$ , '2mg'$ , '4mg'$ ...  Verified this is how pt is currently taking with pt's wife [name]. Per Dr Marin Olp pt to take '2mg'$  daily. [name] verbalizes understanding using teachback that pt resumes Coumadin '2mg'$  daily. dph

## 2020-08-18 ENCOUNTER — Inpatient Hospital Stay (HOSPITAL_BASED_OUTPATIENT_CLINIC_OR_DEPARTMENT_OTHER): Payer: Medicare HMO | Admitting: Hematology & Oncology

## 2020-08-18 ENCOUNTER — Encounter: Payer: Self-pay | Admitting: *Deleted

## 2020-08-18 ENCOUNTER — Inpatient Hospital Stay: Payer: Medicare HMO

## 2020-08-18 ENCOUNTER — Other Ambulatory Visit: Payer: Self-pay

## 2020-08-18 ENCOUNTER — Telehealth: Payer: Self-pay

## 2020-08-18 ENCOUNTER — Other Ambulatory Visit: Payer: Medicare HMO

## 2020-08-18 ENCOUNTER — Encounter: Payer: Self-pay | Admitting: Hematology & Oncology

## 2020-08-18 ENCOUNTER — Other Ambulatory Visit (HOSPITAL_COMMUNITY): Payer: Self-pay

## 2020-08-18 VITALS — BP 145/83 | HR 53 | Temp 98.0°F | Resp 20 | Wt 266.0 lb

## 2020-08-18 DIAGNOSIS — C787 Secondary malignant neoplasm of liver and intrahepatic bile duct: Secondary | ICD-10-CM

## 2020-08-18 DIAGNOSIS — I480 Paroxysmal atrial fibrillation: Secondary | ICD-10-CM

## 2020-08-18 DIAGNOSIS — C189 Malignant neoplasm of colon, unspecified: Secondary | ICD-10-CM

## 2020-08-18 LAB — PROTIME-INR
INR: 8.2 (ref 0.8–1.2)
Prothrombin Time: 68.5 seconds — ABNORMAL HIGH (ref 11.4–15.2)

## 2020-08-18 LAB — CMP (CANCER CENTER ONLY)
ALT: 67 U/L — ABNORMAL HIGH (ref 0–44)
AST: 76 U/L — ABNORMAL HIGH (ref 15–41)
Albumin: 2.9 g/dL — ABNORMAL LOW (ref 3.5–5.0)
Alkaline Phosphatase: 314 U/L — ABNORMAL HIGH (ref 38–126)
Anion gap: 6 (ref 5–15)
BUN: 19 mg/dL (ref 8–23)
CO2: 33 mmol/L — ABNORMAL HIGH (ref 22–32)
Calcium: 8.8 mg/dL — ABNORMAL LOW (ref 8.9–10.3)
Chloride: 105 mmol/L (ref 98–111)
Creatinine: 1.07 mg/dL (ref 0.61–1.24)
GFR, Estimated: >60 mL/min (ref >60)
Glucose, Bld: 175 mg/dL — ABNORMAL HIGH (ref 70–99)
Potassium: 4 mmol/L (ref 3.5–5.1)
Sodium: 144 mmol/L (ref 135–145)
Total Bilirubin: 2.2 mg/dL — ABNORMAL HIGH (ref 0.3–1.2)
Total Protein: 4.6 g/dL — ABNORMAL LOW (ref 6.5–8.1)

## 2020-08-18 LAB — CBC WITH DIFFERENTIAL (CANCER CENTER ONLY)
Abs Immature Granulocytes: 0.22 10*3/uL — ABNORMAL HIGH (ref 0.00–0.07)
Basophils Absolute: 0 10*3/uL (ref 0.0–0.1)
Basophils Relative: 0 %
Eosinophils Absolute: 0.1 10*3/uL (ref 0.0–0.5)
Eosinophils Relative: 2 %
HCT: 38.6 % — ABNORMAL LOW (ref 39.0–52.0)
Hemoglobin: 12.3 g/dL — ABNORMAL LOW (ref 13.0–17.0)
Immature Granulocytes: 5 %
Lymphocytes Relative: 12 %
Lymphs Abs: 0.6 10*3/uL — ABNORMAL LOW (ref 0.7–4.0)
MCH: 30.1 pg (ref 26.0–34.0)
MCHC: 31.9 g/dL (ref 30.0–36.0)
MCV: 94.6 fL (ref 80.0–100.0)
Monocytes Absolute: 0.4 10*3/uL (ref 0.1–1.0)
Monocytes Relative: 9 %
Neutro Abs: 3.4 10*3/uL (ref 1.7–7.7)
Neutrophils Relative %: 72 %
Platelet Count: 42 10*3/uL — ABNORMAL LOW (ref 150–400)
RBC: 4.08 MIL/uL — ABNORMAL LOW (ref 4.22–5.81)
RDW: 18.7 % — ABNORMAL HIGH (ref 11.5–15.5)
WBC Count: 4.7 10*3/uL (ref 4.0–10.5)
nRBC: 2.1 % — ABNORMAL HIGH (ref 0.0–0.2)

## 2020-08-18 LAB — LACTATE DEHYDROGENASE: LDH: 634 U/L — ABNORMAL HIGH (ref 98–192)

## 2020-08-18 LAB — AMMONIA: Ammonia: 54 umol/L — ABNORMAL HIGH (ref 9–35)

## 2020-08-18 LAB — PREALBUMIN: Prealbumin: 16.5 mg/dL — ABNORMAL LOW (ref 18–38)

## 2020-08-18 LAB — CEA (IN HOUSE-CHCC): CEA (CHCC-In House): 3048.22 ng/mL — ABNORMAL HIGH (ref 0.00–5.00)

## 2020-08-18 MED ORDER — METOLAZONE 5 MG PO TABS: 5.0000 mg | ORAL_TABLET | Freq: Every day | ORAL | 4 refills | Status: AC

## 2020-08-18 MED ORDER — ALPELISIB (200 MG DAILY DOSE) 200 MG PO TBPK
ORAL_TABLET | ORAL | 4 refills | Status: AC
  Filled 2020-08-18: qty 30, fill #0

## 2020-08-18 NOTE — Progress Notes (Signed)
Hematology and Oncology Follow Up Visit  Zachary Willis [id number] 06/04/1945 75 y.o. 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  Overall, he is about the same.  His INR is 8.2.  I told him to stop the Coumadin.  I think with his liver disease, and his platelet count going down, I think that Coumadin is going to be too risky for him because of the risk of bleeding.  It is clear that the FOLFOX/Avastin is not working.  His CEA is over 3000 now.  We did do a liquid biopsy on him.  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  As such, may be, insurance will allow [place] use Piqray for him.  We really have no other options at this point.  Our goal clearly is can be quality of life at this point.  I just want him to be able to enjoy what he can do.  I know he is getting more tired.  I know he is getting weaker.  I know his liver is having more difficulty.  He is not hurting.  The Diflucan seem to help his Candida rash.  I told his wife to make sure he stays on the Diflucan.  He has had no issues with diarrhea.  He has had no cough.  He has had no cardiac issues.  I would have to say that overall, his biggest problem is probably the leg swelling.  Has a lot of leg swelling.  He has 4+ edema in his legs.  I am sure this is from his low albumin and also  from his liver disease.  I will start Zaroxolyn.  I will start 5 mg.  He will then take Lasix at 60 mg about half hour after Zaroxolyn.  Maybe this will help his leg edema.  If not, we can try him on albumin infusions.  Again I just want quality of life to be our goal.  Currently, his performance status is probably ECOG 2-3.   Medications:  Allergies as of 08/18/2020   No Known Allergies      Medication List        Accurate as of August 18, 2020  9:54 AM. If you have any questions, ask your nurse or doctor.          atorvastatin 40 MG tablet Commonly known as: LIPITOR Take 40 mg by mouth daily.   BD Pen Needle Nano U/F 32G X 4 MM Misc Generic drug: Insulin Pen Needle Use as needed with GLP1a pen   carvedilol 6.25 MG tablet Commonly known as: COREG Take 6.25 mg by mouth 2 (two) times daily with a meal.   dexamethasone 4 MG tablet Commonly known as: DECADRON Take 2 tablets (8 mg total) by mouth daily. Start  the day after chemotherapy for 2 days. Take with food.   ferrous sulfate 325 (65 FE) MG tablet Take 325 mg by mouth daily.   fluconazole 100 MG tablet Commonly known as: DIFLUCAN Take 1 tablet (100 mg total) by mouth daily. Take 2 tablets the first day then 1 tablet daily until finished.   furosemide 40 MG tablet Commonly known as: LASIX Take 20 mg by mouth every other day.   hydrocortisone 10 MG tablet Commonly known as: CORTEF Take 1-2 tablets (10-20 mg total) by mouth See admin instructions. 18m in AM 186min PM   ibuprofen 200 MG tablet Commonly known as: ADVIL Take 400 mg by mouth every 6 (six) hours as needed for headache or mild pain.   levothyroxine 88 MCG tablet Commonly known as: SYNTHROID Take 88 mcg by mouth daily before breakfast.   lidocaine-prilocaine cream Commonly known as: EMLA Apply to affected area once   LORazepam 0.5 MG tablet Commonly known as: Ativan Take 1 tablet (0.5 mg total) by mouth every 6 (six) hours as needed  (Nausea or vomiting).   NASAL WAPomonaA Place 1 spray into the nose daily.   ondansetron 8 MG tablet Commonly known as: Zofran Take 1 tablet (8 mg total) by mouth every 4 (four) hours as needed for nausea.   ondansetron 8 MG tablet Commonly known as: Zofran Take 1 tablet (8 mg total) by mouth 2 (two) times daily as needed for refractory nausea / vomiting. Start on day 3 after chemotherapy.   OneTouch Delica Plus [id number] Apply topically 2 (two) times daily.   OneTouch Verio test strip Generic drug: glucose blood 1 each 2 (two) times daily.   Ozempic (0.25 or 0.5 MG/DOSE) 2 MG/1.5ML Sopn Generic drug: Semaglutide(0.25 or 0.5MG/DOS) Inject into the skin once a week.   pantoprazole 40 MG tablet Commonly known as: PROTONIX Take 40 mg by mouth every morning.   polyvinyl alcohol 1.4 % ophthalmic solution Commonly known as: LIQUIFILM TEARS Place 1 drop into both eyes daily as needed for dry eyes.   Potassium Chloride ER 20 MEQ Tbcr Take 20 mEq by mouth 2 (two) times daily.   prochlorperazine 10 MG tablet Commonly known as: COMPAZINE Take 1 tablet (10 mg total) by mouth every 6 (six) hours as needed (Nausea or vomiting).   telmisartan 80 MG tablet Commonly known as: MICARDIS TAKE 1 TABLET BY MOUTH EVERY DAY   testosterone 4 MG/24HR Pt24 patch Commonly known as: ANDRODERM Place 1 patch onto the skin daily.   venlafaxine 37.5 MG tablet Commonly known as: EFFEXOR Take 1 tablet (37.5 mg total) by mouth daily.   warfarin 4 MG tablet Commonly known as: COUMADIN TAKE 1 TABLET BY MOUTH EVERY DAY What changed:  how much to take how to take this when to take this additional instructions        Allergies: No Known Allergies  Past Medical History, Surgical history, Social history, and Family History were reviewed and updated.  Review of Systems: Review of Systems  Constitutional: Negative.   HENT: Negative.    Eyes: Negative.   Respiratory: Negative.     Cardiovascular: Negative.   Gastrointestinal: Negative.   Genitourinary: Negative.   Musculoskeletal: Negative.   Skin: Negative.   Neurological: Negative.   Endo/Heme/Allergies: Negative.   Psychiatric/Behavioral: Negative.       Physical Exam:  weight is 266 lb (120.7 kg). His oral temperature is 98 F (36.7 C). His blood pressure is 145/83 (abnormal) and his pulse is  53 (abnormal). His respiration is 20.   Wt Readings from Last 3 Encounters:  08/18/20 266 lb (120.7 kg)  08/05/20 268 lb (121.6 kg)  07/20/20 260 lb (117.9 kg)    Physical Exam Vitals reviewed.  HENT:     Head: Normocephalic and atraumatic.  Eyes:     Pupils: Pupils are equal, round, and reactive to light.  Cardiovascular:     Rate and Rhythm: Normal rate and regular rhythm.     Heart sounds: Normal heart sounds.     Comments: Cardiac exam shows a regular rate and rhythm.  There is some occasional extra beat.  I do not hear any obvious atrial fibrillation. Pulmonary:     Effort: Pulmonary effort is normal.     Breath sounds: Normal breath sounds.  Abdominal:     General: Bowel sounds are normal.     Palpations: Abdomen is soft.     Comments: Abdominal exam shows laparotomy scars.  He has no fluid wave.  He has no guarding or rebound tenderness.  There is no palpable liver or spleen tip.  Musculoskeletal:        General: No tenderness or deformity. Normal range of motion.     Cervical back: Normal range of motion.     Comments: On the anterior aspect of the left lower leg, he does have the wound that is healing.  There is an eschar.  There is mild edema.  No erythema is noted.  Lymphadenopathy:     Cervical: No cervical adenopathy.  Skin:    General: Skin is warm and dry.     Findings: No erythema or rash.  Neurological:     Mental Status: He is alert and oriented to person, place, and time.  Psychiatric:        Behavior: Behavior normal.        Thought Content: Thought content normal.         Judgment: Judgment normal.   Lab Results  Component Value Date   WBC 4.7 08/18/2020   HGB 12.3 (L) 08/18/2020   HCT 38.6 (L) 08/18/2020   MCV 94.6 08/18/2020   PLT 42 (L) 08/18/2020   Lab Results  Component Value Date   FERRITIN 304 07/20/2020   IRON 76 07/20/2020   TIBC 219 07/20/2020   UIBC 143 07/20/2020   IRONPCTSAT 35 07/20/2020   Lab Results  Component Value Date   RETICCTPCT 1.3 04/09/2020   RBC 4.08 (L) 08/18/2020   No results found for: KPAFRELGTCHN, LAMBDASER, KAPLAMBRATIO No results found for: IGGSERUM, IGA, IGMSERUM No results found for: Ronnald Ramp, A1GS, A2GS, Tillman Sers, SPEI   Chemistry      Component Value Date/Time   NA 144 08/18/2020 0839   K 4.0 08/18/2020 0839   CL 105 08/18/2020 0839   CO2 33 (H) 08/18/2020 0839   BUN 19 08/18/2020 0839   CREATININE 1.07 08/18/2020 0839      Component Value Date/Time   CALCIUM 8.8 (L) 08/18/2020 0839   ALKPHOS 314 (H) 08/18/2020 0839   AST 76 (H) 08/18/2020 0839   ALT 67 (H) 08/18/2020 0839   BILITOT 2.2 (H) 08/18/2020 0839      Impression and Plan: Zachary Willis is a very pleasant 75 yo caucasian gentleman with metastatic colon cancer.   Hopefully, the insurance will allow [place] to use Piqray.  Again, we have no other options for him.  I do think that his family would understand quite well about [place] and insurance  not approving this.  I think this were the case, then we would seriously think about getting Hospice involved.  I would like to think about get him back in about 3 weeks.  I think we will have a much better idea as to how he is doing at that point.  Have I talked him about Piqray.  I think the biggest risk for Piqray is his high blood sugars.  These will certainly get worse.  Outside of that, I think that [place] should be fairly well tolerated.  I just hate the fact that our options are minimal now.  Zachary Willis has done a great job.  His wife is done an amazing job with him.   They have been married I think 15 years.  I am very impressed with how strong they are together.     [name], MD 8/16/20229:54 AM

## 2020-08-18 NOTE — Telephone Encounter (Signed)
-----   Message from [name], MD sent at 08/18/2020  2:28 PM EDT ----- Call - the INR is 8.2  Please make sure NO coumadin is given to him again!!  Zachary Willis

## 2020-08-18 NOTE — Progress Notes (Unsigned)
Critical INR 8.2 given to Tyler Aas, RN at 1124 am 08/18/2020 by Dorian Furnace (MT)

## 2020-08-18 NOTE — Telephone Encounter (Signed)
Patient informed of results and to stop his coumadin immediately. Patients wife verbalized and will not have patient take any more coumadin.

## 2020-08-19 ENCOUNTER — Other Ambulatory Visit: Payer: Medicare HMO

## 2020-08-19 ENCOUNTER — Ambulatory Visit: Payer: Medicare HMO

## 2020-08-19 ENCOUNTER — Other Ambulatory Visit (HOSPITAL_COMMUNITY): Payer: Self-pay

## 2020-08-19 ENCOUNTER — Ambulatory Visit: Payer: Medicare HMO | Admitting: Hematology & Oncology

## 2020-08-19 ENCOUNTER — Telehealth: Payer: Self-pay | Admitting: Pharmacist

## 2020-08-20 ENCOUNTER — Telehealth: Payer: Self-pay | Admitting: Pharmacy Technician

## 2020-08-20 ENCOUNTER — Telehealth: Payer: Self-pay

## 2020-08-20 NOTE — Telephone Encounter (Signed)
Oral Oncology Patient Advocate Encounter   Received notification from [place] that prior authorization for Zachary Willis is required.   PA submitted on CoverMyMeds Key [id number]  Status is pending   Oral Oncology Clinic will continue to follow.  [place] [facility] [place] Phone [phone] Fax [phone] 08/20/2020 4:10 PM

## 2020-08-21 NOTE — Telephone Encounter (Signed)
Oral Oncology Patient Advocate Encounter  Received notification from [employer] that the request for prior authorization for Piqray has been denied due to the information provided by your prescriber (diagnosis of metastatic colon cancer KRAS mutation, PIK3CA mutation or low grade B-cell lymphoma) did not meet the requirement(s) of a medically-accepted indication.   An appeal is currently being worked on and will be submitted when complete.    This encounter will continue to be updated until final determination.    Judy Mcadams CPHT Specialty Pharmacy Patient Advocate [facility] Phone [phone] Fax [phone] 08/21/2020 9:24 AM   

## 2020-08-24 ENCOUNTER — Telehealth: Payer: Self-pay | Admitting: Pharmacy Technician

## 2020-08-24 NOTE — Telephone Encounter (Signed)
Oral Chemotherapy Pharmacist Encounter   Called patient's [facility] Endocrinology office, Dr. Elisabeth Most, and left a message on the nurse line letting them know about the initiation of Piqray (alpelisib) and the likely affect on this blood glucose. Per Care Everywhere notes, they are managing his diabetes.  Darl Pikes, PharmD, BCPS, BCOP, CPP Hematology/Oncology Clinical Pharmacist [facility] [phone]  08/24/2020 1:08 PM

## 2020-08-24 NOTE — Telephone Encounter (Signed)
Oral Oncology Pharmacist Encounter  Received new prescription for Piqray (alpelisib) for the treatment of metastatic colon cancer, PIK3CA mutation positive, planned duration until disease progression or unacceptable drug toxicity.  CMP from 08/18/20 assessed, blood glucose is elevated.  Patient is a known type 2 diabetic. Patient is currently on Ozempic and being seen by [facility] endocrinology Dr. Kenton Kingfisher, next office visit per Care Everywhere is on 09/24/20. Alpelisib is very likely to cause an increase in Mr. Kent blood glucose and he will need additional/optimized diabetes management.  The average onset of hyperglycemia is ~2 weeks.  The recommendation is to monitor at least weekly for the first 2 weeks, then at least once every 4 weeks there after.  Prescription dose and frequency assessed. MD is starting patient on a reduced dose of alpelisib.  Current medication list in Epic reviewed, one relevent DDIs with alpelisib identified: -Warfarin: Alpelisib may decrease the serum concentration warfarin. Current Epic documentation notes that patient's warfarin is currently on hold. Patient will need increased INR monitoring with the initiation of alpelisib.   Evaluated chart and no patient barriers to medication adherence identified.   Prescription has been e-scribed to the [facility] for benefits analysis and approval.  Oral Oncology Clinic will continue to follow for insurance authorization, copayment issues, initial counseling and start date.   Darl Pikes, PharmD, BCPS, BCOP, CPP Hematology/Oncology Clinical Pharmacist Practitioner [facility] [phone]  08/24/2020 12:18 PM

## 2020-08-24 NOTE — Telephone Encounter (Signed)
Oral Oncology Patient Advocate Encounter  With the patients permission, I submitted an online application to Patient Assistance [employer] Oncology [facility]).  This will provide Piqray to the patient at no out of pocket cost if approved.  Confirmation [id number]  I faxed the completed Health Care Provider form to [phone].  [facility] phone number is [phone].  [place] Patient [place] Phone [phone] Fax [phone] 08/24/2020 4:17 PM

## 2020-08-25 NOTE — Telephone Encounter (Signed)
Oral Chemotherapy Pharmacist Encounter  Patient Education I spoke with patient and his wife for overview of new oral chemotherapy medication: Piqray (alpelisib) for the treatment of metastatic colon cancer, PIK3CA mutation positive, planned duration until disease progression or unacceptable drug toxicity.   Counseled patient on administration, dosing, side effects, monitoring, drug-food interactions, safe handling, storage, and disposal. Patient will take one 232m tablet with food at the same time daily.  Side effects include but not limited to: hyperglycemia, rash/itchy skin, N/V.   Rash: he will start loratadine 173mdaily for rash prophylaxis Hyperglycemia: He checks his blood sugar daily at home. Informed him that the average onset of blood glucose changes is two weeks into therapy. He will bring a log of his home with him to his clinic appointments. Ask him to fast prior to labs so a fasting blood glucose can be evaluated.   Reviewed and explained the warfarin and Piqray DDI to [name]. Dr. EnMarin Olps the provider managing his warfarin.  Reviewed with patient importance of keeping a medication schedule and plan for any missed doses.  After discussion with patient no patient barriers to medication adherence identified.   [name] voiced understanding and appreciation. All questions answered. Medication handout provided.  Provided patient with Oral [facility] number. Patient knows to call the office with questions or concerns. Oral Chemotherapy Navigation Clinic will continue to follow.  AlDarl PikesPharmD, BCPS, BCOP, CPP Hematology/Oncology Clinical Pharmacist Practitioner [id number]/23/2022 4:01 PM

## 2020-08-27 ENCOUNTER — Ambulatory Visit: Payer: Medicare HMO

## 2020-08-27 ENCOUNTER — Other Ambulatory Visit: Payer: Medicare HMO

## 2020-08-27 ENCOUNTER — Ambulatory Visit: Payer: Medicare HMO | Admitting: Hematology & Oncology

## 2020-08-27 NOTE — Telephone Encounter (Signed)
Oral Oncology Patient Advocate Encounter  Received a call from [place] with [employer] to go over benefit information.  Due to prior authorization and appeal being denied, patient is being referred to [employer] [facility] [place].  They will send a determination in the next 24-48 hours.  [place] Patient [place] Phone [phone] Fax [phone] 08/27/2020 11:51 AM

## 2020-08-28 NOTE — Telephone Encounter (Signed)
Oral Oncology Patient Advocate Encounter  Received notification from [employer] that patient has been successfully enrolled into their program to receive Piqray from the manufacturer at $0 out of pocket until 01/02/21.    I called and spoke with patient.  He knows we will have to re-apply.   Specialty Pharmacy that will dispense medication is [facility] by [employer].  Patient knows to call the office with questions or concerns.   Oral Oncology Clinic will continue to follow.  [place] Patient [place] Phone [phone] Fax [phone] 08/28/2020 10:22 AM

## 2020-08-31 ENCOUNTER — Telehealth: Payer: Self-pay

## 2020-08-31 ENCOUNTER — Other Ambulatory Visit: Payer: Self-pay

## 2020-08-31 ENCOUNTER — Inpatient Hospital Stay: Payer: Medicare HMO

## 2020-08-31 VITALS — BP 179/87 | HR 58 | Temp 98.1°F | Resp 24

## 2020-08-31 DIAGNOSIS — I480 Paroxysmal atrial fibrillation: Secondary | ICD-10-CM

## 2020-08-31 DIAGNOSIS — D72820 Lymphocytosis (symptomatic): Secondary | ICD-10-CM

## 2020-08-31 DIAGNOSIS — C787 Secondary malignant neoplasm of liver and intrahepatic bile duct: Secondary | ICD-10-CM | POA: Diagnosis not present

## 2020-08-31 DIAGNOSIS — C189 Malignant neoplasm of colon, unspecified: Secondary | ICD-10-CM

## 2020-08-31 MED ORDER — SODIUM CHLORIDE 0.9% FLUSH
10.0000 mL | INTRAVENOUS | Status: DC | PRN
  Administered 2020-08-31: 10 mL via INTRAVENOUS

## 2020-08-31 MED ORDER — SODIUM CHLORIDE 0.9 % IV SOLN: Freq: Once | INTRAVENOUS | Status: AC

## 2020-08-31 MED ORDER — HEPARIN SOD (PORK) LOCK FLUSH 100 UNIT/ML IV SOLN
500.0000 [IU] | Freq: Once | INTRAVENOUS | Status: AC
  Administered 2020-08-31: 500 [IU] via INTRAVENOUS

## 2020-08-31 MED ORDER — PIOGLITAZONE HCL 30 MG PO TABS: 30.0000 mg | ORAL_TABLET | Freq: Every day | ORAL | 0 refills | Status: AC

## 2020-08-31 NOTE — Telephone Encounter (Signed)
Oral Oncology Patient Advocate Encounter  Received notification that the denial for Zachary Willis has been upheld.  Patient was notified of determination.  We have applied for assistance through the manufacturer to obtain medication for patient.  [place] Patient [place] Phone [phone] Fax [phone] 08/31/2020 11:53 AM

## 2020-08-31 NOTE — Telephone Encounter (Signed)
Received VM from pt's wife Gwenn reporting that Zachary Willis is already negatively affecting pt's blood sugar. Reports after just 2-3 days of new med, blood sugars have steadily increased from in the 300s on Friday, 400s over the weekend and was 535 this am.   Upon return call, Gwenn reports she has contacted pt's endocrinologist and will be seen in their office tomorrow. Per Dr Marin Olp, Actos prescribed x 1 week to cover until endocrinology makes recommendation. Per Dr Marin Olp, do NOT hold [place]. Gwenn verbalizes understanding using teachback. dph

## 2020-08-31 NOTE — Telephone Encounter (Signed)
Oral Oncology Patient Advocate Encounter  An urgent appeal for the prior authorization denial of Piqray was submitted on 08/26/20.   Appeal packet was faxed to [phone].    The insurance company states that a 30 hour turn around time is to be expected for urgent redeterminations of denial.   This encounter will continue to be updated until final appeal determination.    [place] Patient [place] Phone [phone] Fax [phone] 08/31/2020 11:51 AM

## 2020-08-31 NOTE — Patient Instructions (Signed)

## 2020-09-04 ENCOUNTER — Telehealth: Payer: Self-pay

## 2020-09-04 ENCOUNTER — Inpatient Hospital Stay: Payer: Medicare HMO | Attending: Hematology & Oncology

## 2020-09-04 ENCOUNTER — Encounter: Payer: Self-pay | Admitting: Hematology & Oncology

## 2020-09-04 ENCOUNTER — Other Ambulatory Visit: Payer: Self-pay | Admitting: *Deleted

## 2020-09-04 ENCOUNTER — Inpatient Hospital Stay: Payer: Medicare HMO

## 2020-09-04 ENCOUNTER — Other Ambulatory Visit: Payer: Self-pay

## 2020-09-04 ENCOUNTER — Inpatient Hospital Stay (HOSPITAL_BASED_OUTPATIENT_CLINIC_OR_DEPARTMENT_OTHER): Payer: Medicare HMO | Admitting: Hematology & Oncology

## 2020-09-04 VITALS — BP 140/85 | HR 47 | Temp 97.6°F | Resp 20 | Ht 70.08 in

## 2020-09-04 DIAGNOSIS — Z7901 Long term (current) use of anticoagulants: Secondary | ICD-10-CM | POA: Insufficient documentation

## 2020-09-04 DIAGNOSIS — R739 Hyperglycemia, unspecified: Secondary | ICD-10-CM | POA: Insufficient documentation

## 2020-09-04 DIAGNOSIS — C189 Malignant neoplasm of colon, unspecified: Secondary | ICD-10-CM

## 2020-09-04 DIAGNOSIS — C787 Secondary malignant neoplasm of liver and intrahepatic bile duct: Secondary | ICD-10-CM | POA: Diagnosis not present

## 2020-09-04 DIAGNOSIS — D693 Immune thrombocytopenic purpura: Secondary | ICD-10-CM | POA: Insufficient documentation

## 2020-09-04 DIAGNOSIS — C8580 Other specified types of non-Hodgkin lymphoma, unspecified site: Secondary | ICD-10-CM | POA: Diagnosis not present

## 2020-09-04 LAB — CMP (CANCER CENTER ONLY)
ALT: 59 U/L — ABNORMAL HIGH (ref 0–44)
AST: 63 U/L — ABNORMAL HIGH (ref 15–41)
Albumin: 3.1 g/dL — ABNORMAL LOW (ref 3.5–5.0)
Alkaline Phosphatase: 427 U/L — ABNORMAL HIGH (ref 38–126)
Anion gap: 6 (ref 5–15)
BUN: 25 mg/dL — ABNORMAL HIGH (ref 8–23)
CO2: 34 mmol/L — ABNORMAL HIGH (ref 22–32)
Calcium: 8.9 mg/dL (ref 8.9–10.3)
Chloride: 100 mmol/L (ref 98–111)
Creatinine: 1.74 mg/dL — ABNORMAL HIGH (ref 0.61–1.24)
GFR, Estimated: 41 mL/min — ABNORMAL LOW (ref >60)
Glucose, Bld: 347 mg/dL — ABNORMAL HIGH (ref 70–99)
Potassium: 4.7 mmol/L (ref 3.5–5.1)
Sodium: 140 mmol/L (ref 135–145)
Total Bilirubin: 3.9 mg/dL (ref 0.3–1.2)
Total Protein: 5.7 g/dL — ABNORMAL LOW (ref 6.5–8.1)

## 2020-09-04 LAB — CBC WITH DIFFERENTIAL (CANCER CENTER ONLY)
Abs Immature Granulocytes: 0.03 10*3/uL (ref 0.00–0.07)
Basophils Absolute: 0 10*3/uL (ref 0.0–0.1)
Basophils Relative: 1 %
Eosinophils Absolute: 0.3 10*3/uL (ref 0.0–0.5)
Eosinophils Relative: 4 %
HCT: 47.2 % (ref 39.0–52.0)
Hemoglobin: 15 g/dL (ref 13.0–17.0)
Immature Granulocytes: 1 %
Lymphocytes Relative: 8 %
Lymphs Abs: 0.5 10*3/uL — ABNORMAL LOW (ref 0.7–4.0)
MCH: 29.6 pg (ref 26.0–34.0)
MCHC: 31.8 g/dL (ref 30.0–36.0)
MCV: 93.3 fL (ref 80.0–100.0)
Monocytes Absolute: 0.4 10*3/uL (ref 0.1–1.0)
Monocytes Relative: 6 %
Neutro Abs: 5.3 10*3/uL (ref 1.7–7.7)
Neutrophils Relative %: 80 %
Platelet Count: 100 10*3/uL — ABNORMAL LOW (ref 150–400)
RBC: 5.06 MIL/uL (ref 4.22–5.81)
RDW: 18.6 % — ABNORMAL HIGH (ref 11.5–15.5)
WBC Count: 6.5 10*3/uL (ref 4.0–10.5)
nRBC: 0.8 % — ABNORMAL HIGH (ref 0.0–0.2)

## 2020-09-04 LAB — LACTATE DEHYDROGENASE: LDH: 759 U/L — ABNORMAL HIGH (ref 98–192)

## 2020-09-04 LAB — CEA (IN HOUSE-CHCC): CEA (CHCC-In House): 3668.55 ng/mL — ABNORMAL HIGH (ref 0.00–5.00)

## 2020-09-04 LAB — MAGNESIUM: Magnesium: 2 mg/dL (ref 1.7–2.4)

## 2020-09-04 LAB — PROTIME-INR
INR: 1.2 (ref 0.8–1.2)
Prothrombin Time: 15 seconds (ref 11.4–15.2)

## 2020-09-04 MED ORDER — SODIUM CHLORIDE 0.9 % IV SOLN: Freq: Once | INTRAVENOUS | Status: AC

## 2020-09-04 MED ORDER — INSULIN ASPART 100 UNIT/ML IJ SOLN
15.0000 [IU] | Freq: Once | INTRAMUSCULAR | Status: AC
  Administered 2020-09-04: 15 [IU] via SUBCUTANEOUS
  Filled 2020-09-04: qty 0.15

## 2020-09-04 MED ORDER — FLUCONAZOLE 100 MG PO TABS: 100.0000 mg | ORAL_TABLET | Freq: Every day | ORAL | 2 refills | Status: AC

## 2020-09-04 NOTE — Progress Notes (Signed)
Hematology and Oncology Follow Up Visit  Zachary Willis [id number] 15-Jun-1945 75 y.o. 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  He is not looking too good.  He is not feeling too good.  We did start him on Piqray.  He has been on for about 10 days.  Over the past couple days he really has felt worse.  He is felt weak.  He comes in on a wheelchair.  His blood sugars, as expected, are quite high.  His blood sugar today was 347.  He is on insulin at home.  He does have an endocrinologist.  I will give him a little bit of extra insulin.  I will be troubled by his bilirubin.  His bilirubin is 3.9.  This clearly may be an indicator that his liver is starting to have difficulty with respect to his malignancy.  He is off Coumadin.  His INR is 1.2.  His platelet count is 100,000.  I do think it be a bad idea to get him back on Coumadin at 2 mg a day.  His last CEA was over 3000.  His LDH today was 760.  Something just tells me that he is declining relatively quickly.  I talked to he and his wife about his situation.  I do think that we are at a point now where we have to get hospice involved.  I think Hospice would really do a good job with them.  I will think they would however probably him being on Piqray.  This is being provided by the company.  They are in  agreement with Hospice.  I also talked to him about end-of-life issues and about being kept alive on machines.  I explained all of this.  He has a very good understanding of what I was saying.  He and his wife will talk about being kept alive on machine.  I told him that I think that if he went on a breathing machine, that he would not come off because he would be too weak.  His appetite is not that great.  I do have him on quite a bit of diuretic.  His legs are not nearly as swollen which is encouraging.  I just want his quality of life to be better.  Currently, I would say his performance status is probably ECOG 3.   Medications:  Allergies as of 09/04/2020   No Known Allergies      Medication List        Accurate as of September 04, 2020 11:13 AM. If you have any questions, ask your nurse or doctor.          STOP taking these medications    fluconazole 100 MG tablet Commonly known as: DIFLUCAN Stopped by: PVolanda Napoleon MD  hydrocortisone 10 MG tablet Commonly known as: CORTEF Stopped by: [name], MD       TAKE these medications    alpelisib 200 MG Therapy Pack Commonly known as: PIQRAY 273m Daily Dose Take one 2048mtablet with food at the same time daily. Swallow whole, do not crush, chew, or split.   atorvastatin 40 MG tablet Commonly known as: LIPITOR Take 40 mg by mouth daily.   BD Pen Needle Nano U/F 32G X 4 MM Misc Generic drug: Insulin Pen Needle Use as needed with GLP1a pen   carvedilol 6.25 MG tablet Commonly known as: COREG Take 6.25 mg by mouth 2 (two) times daily with a meal.   dexamethasone 4 MG tablet Commonly known as: DECADRON Take 2 tablets (8 mg total) by mouth daily. Start the day after chemotherapy for 2 days. Take with food.   ferrous sulfate 325 (65 FE) MG tablet Take 325 mg by mouth daily.   furosemide 40 MG tablet Commonly known as: LASIX Take 20 mg by mouth every other day.   ibuprofen 200 MG tablet Commonly  known as: ADVIL Take 400 mg by mouth every 6 (six) hours as needed for headache or mild pain.   levothyroxine 88 MCG tablet Commonly known as: SYNTHROID Take 88 mcg by mouth daily before breakfast.   lidocaine-prilocaine cream Commonly known as: EMLA Apply to affected area once   LORazepam 0.5 MG tablet Commonly known as: Ativan Take 1 tablet (0.5 mg total) by mouth every 6 (six) hours as needed (Nausea or vomiting).   metolazone 5 MG tablet Commonly known as: ZAROXOLYN Take 1 tablet (5 mg total) by mouth daily. Take 60 mg of Lasix about 30 minutes AFTER metolazone   NASAL WASH NA Place 1 spray into the nose daily.   NovoLOG FlexPen 100 UNIT/ML FlexPen Generic drug: insulin aspart See admin instructions.   ondansetron 8 MG tablet Commonly known as: Zofran Take 1 tablet (8 mg total) by mouth every 4 (four) hours as needed for nausea.   ondansetron 8 MG tablet Commonly known as: Zofran Take 1 tablet (8 mg total) by mouth 2 (two) times daily as needed for refractory nausea / vomiting. Start on day 3 after chemotherapy.   OneTouch Delica Plus [id number] Apply topically 2 (two) times daily.   OneTouch Verio test strip Generic drug: glucose blood 1 each 2 (two) times daily.   Ozempic (0.25 or 0.5 MG/DOSE) 2 MG/1.5ML Sopn Generic drug: Semaglutide(0.25 or 0.5MG/DOS) Inject into the skin once a week.   pantoprazole 40 MG tablet Commonly known as: PROTONIX Take 40 mg by mouth every morning.   pioglitazone 30 MG tablet Commonly known as: Actos Take 1 tablet (30 mg total) by mouth daily.   polyvinyl alcohol 1.4 % ophthalmic solution Commonly known as: LIQUIFILM TEARS Place 1 drop into both eyes daily as needed for dry eyes.   Potassium Chloride ER 20 MEQ Tbcr Take 20 mEq by mouth 2 (two) times daily.   prochlorperazine 10 MG tablet Commonly known as: COMPAZINE Take 1 tablet (10 mg total) by mouth every 6 (six) hours as needed (Nausea or vomiting).    telmisartan 80 MG tablet Commonly known as: MICARDIS TAKE 1 TABLET BY MOUTH EVERY DAY   testosterone 4 MG/24HR Pt24 patch Commonly known as: ANDRODERM Place 1 patch onto the skin daily.   TrTyler AaslexTouch 100 UNIT/ML FlexTouch Pen Generic drug: insulin degludec Inject 25 Units into the skin daily.   venlafaxine 37.5 MG tablet Commonly  known as: EFFEXOR Take 1 tablet (37.5 mg total) by mouth daily.   warfarin 4 MG tablet Commonly known as: COUMADIN TAKE 1 TABLET BY MOUTH EVERY DAY What changed:  how much to take how to take this when to take this additional instructions        Allergies: No Known Allergies  Past Medical History, Surgical history, Social history, and Family History were reviewed and updated.  Review of Systems: Review of Systems  Constitutional: Negative.   HENT: Negative.    Eyes: Negative.   Respiratory: Negative.    Cardiovascular: Negative.   Gastrointestinal: Negative.   Genitourinary: Negative.   Musculoskeletal: Negative.   Skin: Negative.   Neurological: Negative.   Endo/Heme/Allergies: Negative.   Psychiatric/Behavioral: Negative.       Physical Exam:  height is 5' 10.08" (1.78 m). His oral temperature is 97.6 F (36.4 C). His blood pressure is 140/85 and his pulse is 47 (abnormal). His respiration is 20 and oxygen saturation is 95%.   Wt Readings from Last 3 Encounters:  08/18/20 266 lb (120.7 kg)  08/05/20 268 lb (121.6 kg)  07/20/20 260 lb (117.9 kg)    Physical Exam Vitals reviewed.  HENT:     Head: Normocephalic and atraumatic.  Eyes:     Pupils: Pupils are equal, round, and reactive to light.  Cardiovascular:     Rate and Rhythm: Normal rate and regular rhythm.     Heart sounds: Normal heart sounds.     Comments: Cardiac exam shows a regular rate and rhythm.  There is some occasional extra beat.  I do not hear any obvious atrial fibrillation. Pulmonary:     Effort: Pulmonary effort is normal.     Breath sounds:  Normal breath sounds.  Abdominal:     General: Bowel sounds are normal.     Palpations: Abdomen is soft.     Comments: Abdominal exam shows laparotomy scars.  He has no fluid wave.  He has no guarding or rebound tenderness.  There is no palpable liver or spleen tip.  Musculoskeletal:        General: No tenderness or deformity. Normal range of motion.     Cervical back: Normal range of motion.     Comments: On the anterior aspect of the left lower leg, he does have the wound that is healing.  There is an eschar.  There is mild edema.  No erythema is noted.  Lymphadenopathy:     Cervical: No cervical adenopathy.  Skin:    General: Skin is warm and dry.     Findings: No erythema or rash.  Neurological:     Mental Status: He is alert and oriented to person, place, and time.  Psychiatric:        Behavior: Behavior normal.        Thought Content: Thought content normal.        Judgment: Judgment normal.   Lab Results  Component Value Date   WBC 6.5 09/04/2020   HGB 15.0 09/04/2020   HCT 47.2 09/04/2020   MCV 93.3 09/04/2020   PLT 100 (L) 09/04/2020   Lab Results  Component Value Date   FERRITIN 304 07/20/2020   IRON 76 07/20/2020   TIBC 219 07/20/2020   UIBC 143 07/20/2020   IRONPCTSAT 35 07/20/2020   Lab Results  Component Value Date   RETICCTPCT 1.3 04/09/2020   RBC 5.06 09/04/2020   No results found for: KPAFRELGTCHN, LAMBDASER, KAPLAMBRATIO No results found for: IGGSERUM, IGA,  IGMSERUM No results found for: Odetta Pink, SPEI   Chemistry      Component Value Date/Time   NA 140 09/04/2020 0933   K 4.7 09/04/2020 0933   CL 100 09/04/2020 0933   CO2 34 (H) 09/04/2020 0933   BUN 25 (H) 09/04/2020 0933   CREATININE 1.74 (H) 09/04/2020 0933      Component Value Date/Time   CALCIUM 8.9 09/04/2020 0933   ALKPHOS 427 (H) 09/04/2020 0933   AST 63 (H) 09/04/2020 0933   ALT 59 (H) 09/04/2020 0933   BILITOT 3.9 ([place])  09/04/2020 0933      Impression and Plan: Zachary Willis is a very pleasant 75 yo caucasian gentleman with metastatic colon cancer.   I just feel bad that his situation is worsening.  I would have to believe that this is the underlying malignancy doing this.  I would not think that it would be Piqray.  We will give him some IV fluid today.  Hopefully this may make her feel a little bit better.  We will see about getting Hospice involved.  I would like to try to get him back to see me in another couple weeks or so.  I think this would tell us which way things are going for him.  I think if his bilirubin worsens, then I think we clearly know that the cancer is progressing quickly.  It will be interesting to see what his CEA level is.    [name], MD 9/2/202211:13 AM

## 2020-09-04 NOTE — Telephone Encounter (Signed)
Critical bilirubin 3.9 reported from lab. Dr.Ennever informed.

## 2020-09-04 NOTE — Patient Instructions (Signed)

## 2020-09-08 ENCOUNTER — Telehealth: Payer: Self-pay

## 2020-09-11 ENCOUNTER — Other Ambulatory Visit: Payer: Self-pay | Admitting: Hematology & Oncology

## 2020-09-11 DIAGNOSIS — I10 Essential (primary) hypertension: Secondary | ICD-10-CM

## 2020-09-14 ENCOUNTER — Other Ambulatory Visit (HOSPITAL_BASED_OUTPATIENT_CLINIC_OR_DEPARTMENT_OTHER): Payer: Self-pay

## 2020-09-16 ENCOUNTER — Telehealth: Payer: Self-pay

## 2020-09-16 NOTE — Telephone Encounter (Signed)
Patients wife called asking if someone could call her and let her know what to expect with patients progression and the process. Called wife back and left message stating hospice will reach out to patient if they havent already and will discuss the process and what to expect with them but to call us if she has any other questions or concerns.

## 2020-09-17 ENCOUNTER — Telehealth: Payer: Self-pay | Admitting: *Deleted

## 2020-09-17 NOTE — Telephone Encounter (Signed)
Call received from Vara Guardian with [facility] to inform Dr. Marin Olp that pt.'s INR is >8.0, PT is >96.0 and that pt is on Coumadin 2 mg daily.  Dr. Marin Olp notified and order received for pt to stop Coumadin and to recheck PT/INR in one week.  Patty notified of above MD orders and is appreciative of assistance.

## 2020-09-23 ENCOUNTER — Telehealth: Payer: Self-pay | Admitting: *Deleted

## 2020-09-23 NOTE — Telephone Encounter (Signed)
Received phone call from patient's wife to cancel appointments. Patient is in hospice.

## 2020-09-24 ENCOUNTER — Telehealth: Payer: Self-pay | Admitting: *Deleted

## 2020-09-24 NOTE — Telephone Encounter (Signed)
Call received from [facility] with [facility] to notify Dr. Marin Olp that pt.'s INR is greater than 8 and that patient's last dose of Coumadin was on 09/16/20.  Dr. Marin Olp notified.  Verbal order given to [facility] from Dr. Marin Olp for pt to take Vitamin K 10 mg PO once today and then to repeat PT/INR tomorrow.

## 2020-09-25 ENCOUNTER — Inpatient Hospital Stay: Payer: Medicare HMO

## 2020-09-25 ENCOUNTER — Other Ambulatory Visit (HOSPITAL_COMMUNITY): Payer: Self-pay

## 2020-09-25 ENCOUNTER — Inpatient Hospital Stay: Payer: Medicare HMO | Admitting: Hematology & Oncology

## 2020-09-29 ENCOUNTER — Telehealth: Payer: Self-pay

## 2020-09-29 NOTE — Telephone Encounter (Signed)
Patients wife called to inform our office patient passed away on Saturday 16-Oct-2020. Hospice should be sending information. MD informed.

## 2020-09-29 NOTE — Telephone Encounter (Signed)
fax received from [place], patient DOD Oct 08, 2020 at 0430 at home.

## 2020-10-03 DEATH — deceased
